# Patient Record
Sex: Male | Born: 1950 | Race: White | Hispanic: No | Marital: Married | Smoking: Former smoker
Health system: Southern US, Community
[De-identification: ages and names within clinical notes are randomized; demographics above are authoritative.]

## PROBLEM LIST (undated history)

## (undated) DIAGNOSIS — K219 Gastro-esophageal reflux disease without esophagitis: Secondary | ICD-10-CM

## (undated) DIAGNOSIS — I10 Essential (primary) hypertension: Secondary | ICD-10-CM

## (undated) DIAGNOSIS — I472 Ventricular tachycardia, unspecified: Secondary | ICD-10-CM

## (undated) DIAGNOSIS — E119 Type 2 diabetes mellitus without complications: Secondary | ICD-10-CM

## (undated) DIAGNOSIS — I428 Other cardiomyopathies: Secondary | ICD-10-CM

## (undated) DIAGNOSIS — K259 Gastric ulcer, unspecified as acute or chronic, without hemorrhage or perforation: Secondary | ICD-10-CM

## (undated) DIAGNOSIS — Z8739 Personal history of other diseases of the musculoskeletal system and connective tissue: Secondary | ICD-10-CM

## (undated) DIAGNOSIS — I509 Heart failure, unspecified: Secondary | ICD-10-CM

## (undated) DIAGNOSIS — C73 Malignant neoplasm of thyroid gland: Secondary | ICD-10-CM

## (undated) DIAGNOSIS — J45909 Unspecified asthma, uncomplicated: Secondary | ICD-10-CM

## (undated) DIAGNOSIS — E890 Postprocedural hypothyroidism: Secondary | ICD-10-CM

## (undated) DIAGNOSIS — D649 Anemia, unspecified: Secondary | ICD-10-CM

## (undated) DIAGNOSIS — Z95810 Presence of automatic (implantable) cardiac defibrillator: Secondary | ICD-10-CM

## (undated) DIAGNOSIS — M1990 Unspecified osteoarthritis, unspecified site: Secondary | ICD-10-CM

## (undated) DIAGNOSIS — I447 Left bundle-branch block, unspecified: Secondary | ICD-10-CM

## (undated) DIAGNOSIS — E669 Obesity, unspecified: Secondary | ICD-10-CM

## (undated) DIAGNOSIS — I2510 Atherosclerotic heart disease of native coronary artery without angina pectoris: Secondary | ICD-10-CM

## (undated) DIAGNOSIS — E785 Hyperlipidemia, unspecified: Secondary | ICD-10-CM

## (undated) DIAGNOSIS — S83209A Unspecified tear of unspecified meniscus, current injury, unspecified knee, initial encounter: Secondary | ICD-10-CM

## (undated) HISTORY — DX: Essential (primary) hypertension: I10

## (undated) HISTORY — DX: Postprocedural hypothyroidism: E89.0

## (undated) HISTORY — DX: Other cardiomyopathies: I42.8

## (undated) HISTORY — DX: Malignant neoplasm of thyroid gland: C73

## (undated) HISTORY — DX: Obesity, unspecified: E66.9

## (undated) HISTORY — PX: VASECTOMY: SHX75

## (undated) HISTORY — DX: Ventricular tachycardia, unspecified: I47.20

## (undated) HISTORY — DX: Left bundle-branch block, unspecified: I44.7

## (undated) HISTORY — DX: Hyperlipidemia, unspecified: E78.5

## (undated) HISTORY — PX: OTHER SURGICAL HISTORY: SHX169

## (undated) HISTORY — DX: Gastric ulcer, unspecified as acute or chronic, without hemorrhage or perforation: K25.9

## (undated) HISTORY — DX: Atherosclerotic heart disease of native coronary artery without angina pectoris: I25.10

## (undated) HISTORY — DX: Unspecified tear of unspecified meniscus, current injury, unspecified knee, initial encounter: S83.209A

## (undated) HISTORY — DX: Ventricular tachycardia: I47.2

## (undated) HISTORY — DX: Anemia, unspecified: D64.9

---

## 1999-12-18 ENCOUNTER — Encounter: Payer: Self-pay | Admitting: Internal Medicine

## 2000-01-18 ENCOUNTER — Encounter: Payer: Self-pay | Admitting: Internal Medicine

## 2004-12-07 ENCOUNTER — Ambulatory Visit: Payer: Self-pay | Admitting: Internal Medicine

## 2005-12-24 ENCOUNTER — Ambulatory Visit: Payer: Self-pay | Admitting: Internal Medicine

## 2007-01-25 ENCOUNTER — Ambulatory Visit: Payer: Self-pay | Admitting: Internal Medicine

## 2007-01-25 LAB — CONVERTED CEMR LAB
Albumin: 4 g/dL (ref 3.5–5.2)
Alkaline Phosphatase: 69 units/L (ref 39–117)
BUN: 11 mg/dL (ref 6–23)
Bilirubin, Direct: 0.1 mg/dL (ref 0.0–0.3)
Cholesterol: 237 mg/dL (ref 0–200)
Creatinine, Ser: 0.9 mg/dL (ref 0.4–1.5)
Creatinine,U: 71.9 mg/dL
Direct LDL: 170.2 mg/dL
Eosinophils Relative: 2.3 % (ref 0.0–5.0)
GFR calc non Af Amer: 93 mL/min
HCT: 42.6 % (ref 39.0–52.0)
Lymphocytes Relative: 29.8 % (ref 12.0–46.0)
MCHC: 34.3 g/dL (ref 30.0–36.0)
MCV: 88.5 fL (ref 78.0–100.0)
Microalb, Ur: 5.5 mg/dL — ABNORMAL HIGH (ref 0.0–1.9)
Monocytes Absolute: 0.5 10*3/uL (ref 0.2–0.7)
PSA: 0.99 ng/mL (ref 0.10–4.00)
Platelets: 281 10*3/uL (ref 150–400)
RBC: 4.81 M/uL (ref 4.22–5.81)
Sodium: 140 meq/L (ref 135–145)
VLDL: 39 mg/dL (ref 0–40)

## 2007-02-03 ENCOUNTER — Ambulatory Visit: Payer: Self-pay | Admitting: Internal Medicine

## 2007-04-25 ENCOUNTER — Ambulatory Visit: Payer: Self-pay | Admitting: Internal Medicine

## 2007-04-25 LAB — CONVERTED CEMR LAB
AST: 23 units/L (ref 0–37)
BUN: 13 mg/dL (ref 6–23)
Cholesterol: 191 mg/dL (ref 0–200)
Creatinine,U: 123 mg/dL
HDL: 34.1 mg/dL — ABNORMAL LOW (ref >39.0)
Hgb A1c MFr Bld: 6.5 % — ABNORMAL HIGH (ref 4.6–6.0)
Microalb Creat Ratio: 68.3 mg/g — ABNORMAL HIGH (ref 0.0–30.0)
Microalb, Ur: 8.4 mg/dL — ABNORMAL HIGH (ref 0.0–1.9)
Potassium: 4.1 meq/L (ref 3.5–5.1)
VLDL: 38 mg/dL (ref 0–40)

## 2007-05-02 ENCOUNTER — Ambulatory Visit: Payer: Self-pay | Admitting: Internal Medicine

## 2007-05-02 DIAGNOSIS — G5600 Carpal tunnel syndrome, unspecified upper limb: Secondary | ICD-10-CM

## 2007-08-14 ENCOUNTER — Ambulatory Visit: Payer: Self-pay | Admitting: Internal Medicine

## 2007-08-19 LAB — CONVERTED CEMR LAB
Cholesterol: 171 mg/dL (ref 0–200)
HDL: 29.4 mg/dL — ABNORMAL LOW (ref >39.0)
Total CHOL/HDL Ratio: 5.8
Triglycerides: 88 mg/dL (ref 0–149)

## 2007-08-21 ENCOUNTER — Ambulatory Visit: Payer: Self-pay | Admitting: Internal Medicine

## 2007-08-21 ENCOUNTER — Encounter (INDEPENDENT_AMBULATORY_CARE_PROVIDER_SITE_OTHER): Payer: Self-pay | Admitting: *Deleted

## 2007-08-21 LAB — CONVERTED CEMR LAB: HDL goal, serum: 40 mg/dL

## 2007-10-20 ENCOUNTER — Ambulatory Visit: Payer: Self-pay | Admitting: Internal Medicine

## 2007-10-21 LAB — CONVERTED CEMR LAB
Basophils Absolute: 0 10*3/uL (ref 0.0–0.1)
Basophils Relative: 0.2 % (ref 0.0–1.0)
Hemoglobin: 13.5 g/dL (ref 13.0–17.0)
Hgb A1c MFr Bld: 5.4 % (ref 4.6–6.0)
MCHC: 34.9 g/dL (ref 30.0–36.0)
MCV: 89.9 fL (ref 78.0–100.0)
Neutro Abs: 7.6 10*3/uL (ref 1.4–7.7)
Neutrophils Relative %: 73.5 % (ref 43.0–77.0)
Platelets: 300 10*3/uL (ref 150–400)
WBC: 10.3 10*3/uL (ref 4.5–10.5)

## 2007-10-23 ENCOUNTER — Encounter (INDEPENDENT_AMBULATORY_CARE_PROVIDER_SITE_OTHER): Payer: Self-pay | Admitting: *Deleted

## 2007-12-15 ENCOUNTER — Ambulatory Visit: Payer: Self-pay | Admitting: Internal Medicine

## 2007-12-18 LAB — CONVERTED CEMR LAB
ALT: 17 units/L (ref 0–53)
AST: 16 units/L (ref 0–37)
BUN: 9 mg/dL (ref 6–23)
Bilirubin, Direct: 0.1 mg/dL (ref 0.0–0.3)
CO2: 32 meq/L (ref 19–32)
Calcium: 9.4 mg/dL (ref 8.4–10.5)
Creatinine, Ser: 1.2 mg/dL (ref 0.4–1.5)
LDL Cholesterol: 97 mg/dL (ref 0–99)
Potassium: 3.8 meq/L (ref 3.5–5.1)
Sodium: 143 meq/L (ref 135–145)
TSH: 7.8 microintl units/mL — ABNORMAL HIGH (ref 0.35–5.50)
Total Bilirubin: 0.6 mg/dL (ref 0.3–1.2)
Triglycerides: 109 mg/dL (ref 0–149)

## 2007-12-21 ENCOUNTER — Ambulatory Visit: Payer: Self-pay | Admitting: Internal Medicine

## 2007-12-21 DIAGNOSIS — E039 Hypothyroidism, unspecified: Secondary | ICD-10-CM | POA: Insufficient documentation

## 2008-06-12 ENCOUNTER — Ambulatory Visit: Payer: Self-pay | Admitting: Internal Medicine

## 2008-06-24 LAB — CONVERTED CEMR LAB: Hgb A1c MFr Bld: 5.7 % (ref 4.6–6.0)

## 2008-11-01 ENCOUNTER — Ambulatory Visit: Payer: Self-pay | Admitting: Internal Medicine

## 2008-11-11 ENCOUNTER — Encounter (INDEPENDENT_AMBULATORY_CARE_PROVIDER_SITE_OTHER): Payer: Self-pay | Admitting: *Deleted

## 2008-11-11 LAB — CONVERTED CEMR LAB
Albumin: 4 g/dL (ref 3.5–5.2)
Alkaline Phosphatase: 52 units/L (ref 39–117)
BUN: 12 mg/dL (ref 6–23)
Direct LDL: 144.9 mg/dL
HDL: 30 mg/dL — ABNORMAL LOW (ref >39.0)
Microalb Creat Ratio: 43.5 mg/g — ABNORMAL HIGH (ref 0.0–30.0)
Microalb, Ur: 3.6 mg/dL — ABNORMAL HIGH (ref 0.0–1.9)
TSH: 8.67 microintl units/mL — ABNORMAL HIGH (ref 0.35–5.50)
Total Bilirubin: 0.9 mg/dL (ref 0.3–1.2)
Total CHOL/HDL Ratio: 7.4
Total Protein: 7.5 g/dL (ref 6.0–8.3)
Triglycerides: 246 mg/dL (ref 0–149)

## 2008-11-12 ENCOUNTER — Ambulatory Visit: Payer: Self-pay | Admitting: Internal Medicine

## 2009-02-10 ENCOUNTER — Ambulatory Visit: Payer: Self-pay | Admitting: Internal Medicine

## 2009-02-16 LAB — CONVERTED CEMR LAB
ALT: 24 units/L (ref 0–53)
BUN: 13 mg/dL (ref 6–23)
Cholesterol: 137 mg/dL (ref 0–200)
Creatinine, Ser: 0.9 mg/dL (ref 0.4–1.5)
Hgb A1c MFr Bld: 7.2 % — ABNORMAL HIGH (ref 4.6–6.0)
Potassium: 4.1 meq/L (ref 3.5–5.1)
Triglycerides: 146 mg/dL (ref 0–149)
VLDL: 29 mg/dL (ref 0–40)

## 2009-02-18 ENCOUNTER — Ambulatory Visit: Payer: Self-pay | Admitting: Internal Medicine

## 2009-02-26 ENCOUNTER — Telehealth (INDEPENDENT_AMBULATORY_CARE_PROVIDER_SITE_OTHER): Payer: Self-pay | Admitting: *Deleted

## 2009-03-06 ENCOUNTER — Telehealth (INDEPENDENT_AMBULATORY_CARE_PROVIDER_SITE_OTHER): Payer: Self-pay | Admitting: *Deleted

## 2009-03-07 ENCOUNTER — Encounter: Payer: Self-pay | Admitting: Internal Medicine

## 2009-05-21 ENCOUNTER — Ambulatory Visit: Payer: Self-pay | Admitting: Internal Medicine

## 2009-05-21 LAB — CONVERTED CEMR LAB
BUN: 16 mg/dL (ref 6–23)
Creatinine, Ser: 1 mg/dL (ref 0.4–1.5)
Creatinine,U: 166.5 mg/dL
Microalb, Ur: 9.1 mg/dL — ABNORMAL HIGH (ref 0.0–1.9)
Potassium: 3.9 meq/L (ref 3.5–5.1)

## 2009-05-29 ENCOUNTER — Ambulatory Visit: Payer: Self-pay | Admitting: Internal Medicine

## 2009-07-30 ENCOUNTER — Ambulatory Visit: Payer: Self-pay | Admitting: Internal Medicine

## 2009-08-05 LAB — CONVERTED CEMR LAB
Creatinine,U: 161.6 mg/dL
Hgb A1c MFr Bld: 5.7 % (ref 4.6–6.5)
Microalb, Ur: 4.7 mg/dL — ABNORMAL HIGH (ref 0.0–1.9)

## 2009-08-06 ENCOUNTER — Ambulatory Visit: Payer: Self-pay | Admitting: Internal Medicine

## 2009-08-06 DIAGNOSIS — E119 Type 2 diabetes mellitus without complications: Secondary | ICD-10-CM

## 2009-08-06 DIAGNOSIS — E1165 Type 2 diabetes mellitus with hyperglycemia: Secondary | ICD-10-CM | POA: Insufficient documentation

## 2009-08-12 ENCOUNTER — Encounter (INDEPENDENT_AMBULATORY_CARE_PROVIDER_SITE_OTHER): Payer: Self-pay | Admitting: *Deleted

## 2009-11-29 DIAGNOSIS — K259 Gastric ulcer, unspecified as acute or chronic, without hemorrhage or perforation: Secondary | ICD-10-CM

## 2009-11-29 HISTORY — DX: Gastric ulcer, unspecified as acute or chronic, without hemorrhage or perforation: K25.9

## 2010-01-02 ENCOUNTER — Encounter (INDEPENDENT_AMBULATORY_CARE_PROVIDER_SITE_OTHER): Payer: Self-pay | Admitting: *Deleted

## 2010-02-04 ENCOUNTER — Ambulatory Visit: Payer: Self-pay | Admitting: Internal Medicine

## 2010-02-11 ENCOUNTER — Ambulatory Visit: Payer: Self-pay | Admitting: Internal Medicine

## 2010-02-11 DIAGNOSIS — R490 Dysphonia: Secondary | ICD-10-CM

## 2010-03-18 ENCOUNTER — Ambulatory Visit: Payer: Self-pay | Admitting: Internal Medicine

## 2010-03-18 DIAGNOSIS — R509 Fever, unspecified: Secondary | ICD-10-CM

## 2010-03-27 ENCOUNTER — Telehealth (INDEPENDENT_AMBULATORY_CARE_PROVIDER_SITE_OTHER): Payer: Self-pay | Admitting: *Deleted

## 2010-03-29 HISTORY — PX: CARDIAC CATHETERIZATION: SHX172

## 2010-03-29 HISTORY — PX: BI-VENTRICULAR IMPLANTABLE CARDIOVERTER DEFIBRILLATOR  (CRT-D): SHX5747

## 2010-03-30 ENCOUNTER — Encounter: Payer: Self-pay | Admitting: Internal Medicine

## 2010-03-30 ENCOUNTER — Telehealth (INDEPENDENT_AMBULATORY_CARE_PROVIDER_SITE_OTHER): Payer: Self-pay | Admitting: *Deleted

## 2010-04-08 ENCOUNTER — Ambulatory Visit: Payer: Self-pay | Admitting: Internal Medicine

## 2010-04-08 ENCOUNTER — Telehealth (INDEPENDENT_AMBULATORY_CARE_PROVIDER_SITE_OTHER): Payer: Self-pay | Admitting: *Deleted

## 2010-04-09 ENCOUNTER — Telehealth: Payer: Self-pay | Admitting: Internal Medicine

## 2010-04-12 ENCOUNTER — Inpatient Hospital Stay (HOSPITAL_COMMUNITY): Admission: EM | Admit: 2010-04-12 | Discharge: 2010-04-16 | Payer: Self-pay | Admitting: Emergency Medicine

## 2010-04-12 ENCOUNTER — Ambulatory Visit: Payer: Self-pay | Admitting: Internal Medicine

## 2010-04-13 ENCOUNTER — Encounter: Payer: Self-pay | Admitting: Internal Medicine

## 2010-04-14 ENCOUNTER — Encounter: Payer: Self-pay | Admitting: Internal Medicine

## 2010-04-14 ENCOUNTER — Encounter (INDEPENDENT_AMBULATORY_CARE_PROVIDER_SITE_OTHER): Payer: Self-pay | Admitting: Internal Medicine

## 2010-04-17 ENCOUNTER — Telehealth (INDEPENDENT_AMBULATORY_CARE_PROVIDER_SITE_OTHER): Payer: Self-pay | Admitting: *Deleted

## 2010-04-23 ENCOUNTER — Ambulatory Visit: Payer: Self-pay | Admitting: Internal Medicine

## 2010-04-23 DIAGNOSIS — J45909 Unspecified asthma, uncomplicated: Secondary | ICD-10-CM | POA: Insufficient documentation

## 2010-04-29 ENCOUNTER — Ambulatory Visit: Payer: Self-pay

## 2010-04-29 ENCOUNTER — Encounter: Payer: Self-pay | Admitting: Internal Medicine

## 2010-05-01 ENCOUNTER — Ambulatory Visit: Payer: Self-pay | Admitting: Pulmonary Disease

## 2010-05-01 DIAGNOSIS — J811 Chronic pulmonary edema: Secondary | ICD-10-CM

## 2010-05-11 ENCOUNTER — Telehealth (INDEPENDENT_AMBULATORY_CARE_PROVIDER_SITE_OTHER): Payer: Self-pay | Admitting: *Deleted

## 2010-05-26 ENCOUNTER — Ambulatory Visit: Payer: Self-pay | Admitting: Internal Medicine

## 2010-05-26 DIAGNOSIS — M25519 Pain in unspecified shoulder: Secondary | ICD-10-CM | POA: Insufficient documentation

## 2010-05-26 DIAGNOSIS — R5381 Other malaise: Secondary | ICD-10-CM | POA: Insufficient documentation

## 2010-05-26 DIAGNOSIS — R5383 Other fatigue: Secondary | ICD-10-CM

## 2010-05-26 LAB — CONVERTED CEMR LAB
Folate: 16 ng/mL
Lymphocytes Relative: 38.5 % (ref 12.0–46.0)
MCV: 86.6 fL (ref 78.0–100.0)
Monocytes Relative: 19 % — ABNORMAL HIGH (ref 3.0–12.0)
Platelets: 401 10*3/uL — ABNORMAL HIGH (ref 150.0–400.0)
RBC: 3.52 M/uL — ABNORMAL LOW (ref 4.22–5.81)
RDW: 15.1 % — ABNORMAL HIGH (ref 11.5–14.6)
Saturation Ratios: 5.7 % — ABNORMAL LOW (ref 20.0–50.0)

## 2010-05-27 ENCOUNTER — Telehealth (INDEPENDENT_AMBULATORY_CARE_PROVIDER_SITE_OTHER): Payer: Self-pay | Admitting: *Deleted

## 2010-05-27 ENCOUNTER — Telehealth: Payer: Self-pay | Admitting: Internal Medicine

## 2010-05-27 DIAGNOSIS — D509 Iron deficiency anemia, unspecified: Secondary | ICD-10-CM

## 2010-05-28 ENCOUNTER — Encounter: Payer: Self-pay | Admitting: Internal Medicine

## 2010-06-03 ENCOUNTER — Ambulatory Visit: Payer: Self-pay | Admitting: Pulmonary Disease

## 2010-06-15 ENCOUNTER — Telehealth: Payer: Self-pay | Admitting: Internal Medicine

## 2010-06-15 ENCOUNTER — Encounter: Payer: Self-pay | Admitting: Internal Medicine

## 2010-07-14 ENCOUNTER — Ambulatory Visit: Payer: Self-pay | Admitting: Internal Medicine

## 2010-07-14 DIAGNOSIS — K219 Gastro-esophageal reflux disease without esophagitis: Secondary | ICD-10-CM

## 2010-07-21 ENCOUNTER — Ambulatory Visit: Payer: Self-pay | Admitting: Internal Medicine

## 2010-07-21 DIAGNOSIS — Z95810 Presence of automatic (implantable) cardiac defibrillator: Secondary | ICD-10-CM

## 2010-08-10 ENCOUNTER — Ambulatory Visit: Payer: Self-pay | Admitting: Internal Medicine

## 2010-08-12 ENCOUNTER — Ambulatory Visit: Payer: Self-pay | Admitting: Internal Medicine

## 2010-08-14 LAB — CONVERTED CEMR LAB
ALT: 24 units/L (ref 0–53)
AST: 23 units/L (ref 0–37)
Albumin: 4.2 g/dL (ref 3.5–5.2)
Alkaline Phosphatase: 45 units/L (ref 39–117)
BUN: 16 mg/dL (ref 6–23)
Bilirubin, Direct: 0.1 mg/dL (ref 0.0–0.3)
Creatinine,U: 65.2 mg/dL
HDL: 31.9 mg/dL — ABNORMAL LOW (ref >39.00)
LDL Cholesterol: 103 mg/dL — ABNORMAL HIGH (ref 0–99)
Microalb, Ur: 1.3 mg/dL (ref 0.0–1.9)
Total Bilirubin: 0.8 mg/dL (ref 0.3–1.2)
Total CHOL/HDL Ratio: 5
Triglycerides: 127 mg/dL (ref 0.0–149.0)

## 2010-08-16 ENCOUNTER — Encounter: Payer: Self-pay | Admitting: Internal Medicine

## 2010-08-17 ENCOUNTER — Ambulatory Visit: Payer: Self-pay | Admitting: Internal Medicine

## 2010-10-26 ENCOUNTER — Encounter: Payer: Self-pay | Admitting: Internal Medicine

## 2010-10-30 ENCOUNTER — Ambulatory Visit: Payer: Self-pay | Admitting: Internal Medicine

## 2010-11-16 ENCOUNTER — Ambulatory Visit: Payer: Self-pay | Admitting: Internal Medicine

## 2010-11-17 LAB — CONVERTED CEMR LAB: Hgb A1c MFr Bld: 6.2 % (ref 4.6–6.5)

## 2010-11-18 ENCOUNTER — Ambulatory Visit: Payer: Self-pay | Admitting: Internal Medicine

## 2010-12-01 ENCOUNTER — Telehealth (INDEPENDENT_AMBULATORY_CARE_PROVIDER_SITE_OTHER): Payer: Self-pay | Admitting: *Deleted

## 2010-12-08 ENCOUNTER — Encounter (INDEPENDENT_AMBULATORY_CARE_PROVIDER_SITE_OTHER): Payer: Self-pay | Admitting: *Deleted

## 2010-12-27 LAB — CONVERTED CEMR LAB: TSH: 3.49 microintl units/mL (ref 0.35–5.50)

## 2010-12-28 ENCOUNTER — Ambulatory Visit
Admission: RE | Admit: 2010-12-28 | Discharge: 2010-12-28 | Payer: Self-pay | Source: Home / Self Care | Attending: Internal Medicine | Admitting: Internal Medicine

## 2010-12-29 NOTE — Letter (Signed)
Summary: Colonoscopy Letter  [facility]  [address] [place], [place] [address]   Phone: [phone]  Fax: [phone]      January 02, 2010 MRN: [id number]   [facility] [address] [place], [place]  [address]   Dear Mr. Walko,   According to your medical record, it is time for you to schedule a Colonoscopy. The American Cancer Society recommends this procedure as a method to detect early colon cancer. Patients with a family history of colon cancer, or a personal history of colon polyps or inflammatory bowel disease are at increased risk.  This letter has beeen generated based on the recommendations made at the time of your procedure. If you feel that in your particular situation this may no longer apply, please contact our office.  Please call our office at [phone] to schedule this appointment or to update your records at your earliest convenience.  Thank you for cooperating with [place] to provide you with the very best care possible.   Sincerely,  [name]. Marina Goodell, M.D.  [facility] [phone]

## 2010-12-29 NOTE — Procedures (Signed)
Summary: wound check/jml   Current Medications (verified): 1)  Viagra 100 Mg Tabs (Sildenafil Citrate) .... Prn 2)  Metformin Hcl 1000 Mg Tabs (Metformin Hcl) .Marland Kitchen.. 1 By Mouth Two Times A Day 3)  Glimepiride 2 Mg Tabs (Glimepiride) .Marland Kitchen.. 1 By Mouth Two Times A Day 4)  Cozaar 25 Mg Tabs (Losartan Potassium) .Marland Kitchen.. 1 By Mouth Once Daily 5)  Simvastatin 40 Mg  Tabs (Simvastatin) .Marland Kitchen.. 1 Qhs 6)  Levothroid 50 Mcg  Tabs (Levothyroxine Sodium) .Marland Kitchen.. 1 Once Daily 7)  Vit E .... Prn 8)  Fish Oil .... Prn 9)  Aspirin 325 Mg Tabs (Aspirin) .Marland Kitchen.. 1 By Mouth Once Daily 10)  Multivitamin 11)  Niacin Cr 500 Mg Cr-Caps (Niacin) .Marland Kitchen.. 1 By Mouth Once Daily 12)  Januvia 100 Mg Tabs (Sitagliptin Phosphate) .Marland Kitchen.. 1 Once Daily 13)  Ventolin Hfa 108 (90 Base) Mcg/act Aers (Albuterol Sulfate) .Marland Kitchen.. 1-2 Puffs Every 4 Hrs As Needed Cough ,sob 14)  Symbicort 160-4.5 Mcg/act Aero (Budesonide-Formoterol Fumarate) .Marland Kitchen.. 1-2 Puffs Every 12 Hrs As Needed 15)  Promethazine Vc/codeine 6.25-5-10 Mg/69ml Syrp (Phenyleph-Promethazine-Cod) .Marland Kitchen.. 1 Tsp Every 6 Hrs As Needed For Cough 16)  Wavesense Ultra-Thin Lancets  Misc (Lancets) .... 250.00 Check Bloodsugar Once Daily 17)  Wavesense Presto W/device Kit (Blood Glucose Monitoring Suppl) .... 250.00 Check Bs One Time Daily 18)  Wavesense Presto Test  Strp (Glucose Blood) .... 250.00, Check Bs Once Daily 19)  Amiodarone Hcl 200 Mg Tabs (Amiodarone Hcl) .Marland Kitchen.. 1 By Mouth Every 12 Hours 20)  Carvedilol 6.25 Mg Tabs (Carvedilol) .Marland Kitchen.. 1 By Mouth Two Times A Day With Meals 21)  Furosemide 40 Mg Tabs (Furosemide) .Marland Kitchen.. 1 By Mouth Once Daily 22)  Mytussin Ac 100-10 Mg/71ml Syrp (Guaifenesin-Codeine) .Marland Kitchen.. 15 Milliters Every 6 Hours As Needed 23)  Lovaza 1 Gm Caps (Omega-3-Acid Ethyl Esters) .Marland Kitchen.. 1 By Mouth Three Times A Day 24)  Potassium Chloride Otc .Marland Kitchen.. 1 By Mouth Once Daily  Allergies (verified): 1)  * Altace   ICD Specifications Following MD:  Lewayne Bunting, MD     ICD Vendor:  [employer]      ICD Model Number:  [phone]     ICD Serial Number:  [id number] ICD DOI:  04/13/2010     ICD Implanting MD:  Lewayne Bunting, MD  Lead 1:    Location: RA     DOI: 04/13/2010     Model #: [id number]     Serial #: [id number]     Status: active Lead 2:    Location: RV     DOI: 04/13/2010     Model #: [id number]     Serial #: [id number]     Status: active Lead 3:    Location: LV     DOI: 04/13/2010     Model #: [id number]     Serial #: [id number]     Status: active  Indications::  CM   ICD Follow Up Remote Check?  No Battery Voltage:  3.21 V     Charge Time:  8.6 seconds     Underlying rhythm:  SR ICD Dependent:  No       ICD Device Measurements Atrium:  Amplitude: 1.9 mV, Impedance: 475 ohms, Threshold: 1.25 V at 0.4 msec Right Ventricle:  Amplitude: 11.9 mV, Impedance: 475 ohms, Threshold: 1.0 V at 0.4 msec Left Ventricle:  Impedance: 779 ohms, Threshold: 1.0 V at 0.4 msec Configuration: LV TIP TO LV RING Shock Impedance: 45/54 ohms   Episodes MS Episodes:  0  Shock:  0     ATP:  0     Nonsustained:  0     Atrial Pacing:  <0.1%     Ventricular Pacing:  94.6%  Brady Parameters Mode DDD     Lower Rate Limit:  50     Upper Rate Limit 130 PAV 140     Sensed AV Delay:  110  Tachy Zones VF:  200     VT:  240 FVT VIA VF     VT1:  171     Next Cardiology Appt Due:  06/29/2010 Tech Comments:  Wound check appt.  Steri-strips removed.  Wound without redness or edema.  Normal device function.  No changes made today.  ROV 3 months GT. Gypsy Balsam RN BSN  May 11, 2010 3:09 PM

## 2010-12-29 NOTE — Procedures (Signed)
Summary: EGD/[facility]  EGD/[facility]   Imported By: Sherian Rein 07/15/2010 11:12:52  _____________________________________________________________________  External Attachment:    Type:   Image     Comment:   External Document

## 2010-12-29 NOTE — Progress Notes (Signed)
Summary: prior auth APPROVED VIAGRA [employer]  Phone Note Refill Request Message from:  Fax from Pharmacy on March 27, 2010 8:27 AM  Refills Requested: Medication #1:  VIAGRA 100 MG TABS prn [facility] [address] ----fax [phone] - ph [phone] ---ph #  prior Berkley Harvey [phone] for viagra  Initial call taken by: Okey Regal Spring,  March 27, 2010 8:30 AM  Follow-up for Phone Call        AWAITING FAX...............Marland KitchenFelecia Deloach CMA  March 27, 2010 9:15 AM  prior auth faxed back awaiting response.............Marland KitchenFelecia Deloach CMA  March 27, 2010 11:27 AM   Additional Follow-up for Phone Call Additional follow up Details #1::        prior auth APPROVED 03-30-10 UNTIL 04-29-11, PHARMACY FAXED APPROVAL LETTER SCAN TO CHART.................Marland KitchenFelecia Deloach CMA  Mar 30, 2010 3:24 PM

## 2010-12-29 NOTE — Assessment & Plan Note (Signed)
Summary: cough/kdc   Vital Signs:  Patient profile:   60 year old male Weight:      282.4 pounds Temp:     100.4 degrees F oral Pulse rate:   88 / minute Resp:     17 per minute BP sitting:   128 / 78  (left arm) Cuff size:   large  Vitals Entered By: Shonna Chock (March 18, 2010 3:29 PM) CC: Cough x 2 weeks, worse when laying down and fever Comments REVIEWED MED LIST, PATIENT AGREED DOSE AND INSTRUCTION CORRECT    CC:  Cough x 2 weeks and worse when laying down and fever.  History of Present Illness:  Cough      This is a 60 year old man who presents with Cough X 2 weeks.  The patient reports  mainly ("80%")productive cough,   SS chest pain after bouts of cough, shortness of breath, wheezing, exertional dyspnea, fever , and malaise, but denies hemoptysis.  Associated symtpoms include weight loss of 5-10#.  The patient denies the following symptoms: cold/URI symptoms, sore throat, nasal congestion, chronic rhinitis, acid reflux symptoms, and peripheral edema.  The cough is worse with lying down or in LLDP.  Partially effective prior treatments have included OTC cough medication ( Dayquil)  and other asthma medication( QVAR).  Risk factors include PMH  of asthma and history of reflux.    Allergies: 1)  * Altace  Review of Systems General:  Complains of sweats. ENT:  Complains of hoarseness; denies difficulty swallowing, nasal congestion, sinus pressure, and sore throat; No frontal headache , facial pain or purulence. Resp:  Denies chest pain with inspiration and coughing up blood.  Physical Exam  General:  Appears tired but well-nourished,in no acute distress; alert,appropriate and cooperative throughout examination Ears:  External ear exam shows no significant lesions or deformities.  Otoscopic examination reveals clear canals, tympanic membranes are intact bilaterally without bulging, retraction, inflammation or discharge. Hearing is grossly normal bilaterally. Nose:  External  nasal examination shows no deformity or inflammation. Nasal mucosa are dry, especially R nare , without lesions or exudates. Mouth:  Oral mucosa and oropharynx without lesions or exudates.  Teeth in good repair. Hoarse Lungs:  Normal respiratory effort, chest expands symmetrically. Lungs are clear to auscultation, no crackles or wheezes but BS decreased. O2 sats 92% Heart:  regular rhythm, no murmur, tachycardia & S4.  P 109 Cervical Nodes:  No lymphadenopathy noted Axillary Nodes:  No palpable lymphadenopathy   Impression & Recommendations:  Problem # 1:  ASTHMA NOS W/ACUTE EXACERBATION (ICD-493.92)  His updated medication list for this problem includes:    Qvar 80 Mcg/act Aers (Beclomethasone dipropionate) .Marland Kitchen... 2 puffs eveery 12 hrs ; gargle & spit after use    Ventolin Hfa 108 (90 Base) Mcg/act Aers (Albuterol sulfate) .Marland Kitchen... 1-2 puffs every 4 hrs as needed cough ,sob    Symbicort 160-4.5 Mcg/act Aero (Budesonide-formoterol fumarate) .Marland Kitchen... 1-2 puffs every 12 hrs as needed  Orders: T-2 View CXR (71020TC)  Problem # 2:  BRONCHITIS-ACUTE (ICD-466.0)  His updated medication list for this problem includes:    Qvar 80 Mcg/act Aers (Beclomethasone dipropionate) .Marland Kitchen... 2 puffs eveery 12 hrs ; gargle & spit after use    Ventolin Hfa 108 (90 Base) Mcg/act Aers (Albuterol sulfate) .Marland Kitchen... 1-2 puffs every 4 hrs as needed cough ,sob    Avelox Abc Pack 400 Mg Tabs (Moxifloxacin hcl) .Marland Kitchen... 1 once daily    Symbicort 160-4.5 Mcg/act Aero (Budesonide-formoterol fumarate) .Marland Kitchen... 1-2 puffs  every 12 hrs as needed    Promethazine Vc/codeine 6.25-5-10 Mg/35ml Syrp (Phenyleph-promethazine-cod) .Marland Kitchen... 1 tsp every 6 hrs as needed for cough  Orders: T-2 View CXR (71020TC)  Problem # 3:  FEVER UNSPECIFIED (ICD-780.60)  Orders: Flu A+B (87400) T-2 View CXR (71020TC)  Complete Medication List: 1)  Viagra 100 Mg Tabs (Sildenafil citrate) .... Prn 2)  Metformin Hcl 1000 Mg  .Marland Kitchen.. 1 two times a day with meals 3)   Amaryl 4 Mg Tabs (glimepiride)  .... 1/4 once daily 4)  Cozaar 100 Mg Tabs (Losartan potassium) .Marland Kitchen.. 1 once daily out of med 5)  Simvastatin 40 Mg Tabs (Simvastatin) .Marland Kitchen.. 1 qhs 6)  Levothroid 50 Mcg Tabs (levothyroxine Sodium)  .Marland Kitchen.. 1 once daily 7)  Vit E  .... Prn 8)  Fish Oil  .... Prn 9)  Basa  .... Qd 10)  Multivitamin  11)  Potassium Otc  .... Qd 12)  Niacin Cr 500 Mg Cr-caps (Niacin) .Marland Kitchen.. 1 by mouth once daily 13)  Januvia 100 Mg Tabs (Sitagliptin phosphate) .Marland Kitchen.. 1 once daily 14)  Toprol Xl 25 Mg Xr24h-tab (Metoprolol succinate) .Marland Kitchen.. 1 once daily 15)  Qvar 80 Mcg/act Aers (Beclomethasone dipropionate) .... 2 puffs eveery 12 hrs ; gargle & spit after use 16)  Ventolin Hfa 108 (90 Base) Mcg/act Aers (Albuterol sulfate) .Marland Kitchen.. 1-2 puffs every 4 hrs as needed cough ,sob 17)  Avelox Abc Pack 400 Mg Tabs (Moxifloxacin hcl) .Marland Kitchen.. 1 once daily 18)  Symbicort 160-4.5 Mcg/act Aero (Budesonide-formoterol fumarate) .Marland Kitchen.. 1-2 puffs every 12 hrs as needed 19)  Promethazine Vc/codeine 6.25-5-10 Mg/46ml Syrp (Phenyleph-promethazine-cod) .Marland Kitchen.. 1 tsp every 6 hrs as needed for cough  Patient Instructions: 1)  Drink as much fluid as you can tolerate for the next few days. Gargle & spit after using Symbicort. 2)  Take 650-1000mg  of Tylenol every 4-6 hours as needed for relief of pain or comfort of fever AVOID taking more than 4000mg   in a 24 hour period (can cause liver damage in higher doses). Prescriptions: PROMETHAZINE VC/CODEINE 6.25-5-10 MG/5ML SYRP (PHENYLEPH-PROMETHAZINE-COD) 1 tsp every 6 hrs as needed for cough  #120cc x 0   Entered and Authorized by:   Marga Melnick MD   Signed by:   Marga Melnick MD on 03/18/2010   Method used:   Print then Give to Patient   RxID:   [id number] SYMBICORT 160-4.5 MCG/ACT AERO (BUDESONIDE-FORMOTEROL FUMARATE) 1-2 puffs every 12 hrs as needed  #1 x 5   Entered and Authorized by:   Marga Melnick MD   Signed by:   Marga Melnick MD on 03/18/2010   Method  used:   Print then Give to Patient   RxID:   [id number] AVELOX ABC PACK 400 MG TABS (MOXIFLOXACIN HCL) 1 once daily  #5 x 0   Entered and Authorized by:   Marga Melnick MD   Signed by:   Marga Melnick MD on 03/18/2010   Method used:   Samples Given   RxID:   [id number] VENTOLIN HFA 108 (90 BASE) MCG/ACT AERS (ALBUTEROL SULFATE) 1-2 puffs every 4 hrs as needed cough ,SOB  #1 x 0   Entered and Authorized by:   Marga Melnick MD   Signed by:   Marga Melnick MD on 03/18/2010   Method used:   Print then Mail to Patient   RxID:   [id number]   Laboratory Results    Other Tests  Influenza A: negative Influenza B: negative

## 2010-12-29 NOTE — Procedures (Signed)
Summary: Colonoscopy  Patient: Mike Jimenez Note: All result statuses are Final unless otherwise noted.  Tests: (1) Colonoscopy (COL)   COL Colonoscopy           DONE     [facility]     [address]     [place], [place]  [address]           COLONOSCOPY PROCEDURE REPORT           PATIENT:  Mike Jimenez, Mike Jimenez  MR#:  [id number]     BIRTHDATE:  23-Dec-1950, 59 yrs. old  GENDER:  male     ENDOSCOPIST:  Mayjor Ager. Eda Keys, MD     REF. BY:  Screening Recall     PROCEDURE DATE:  08/12/2010     PROCEDURE:  Colonoscopy with snare polypectomy x 4     ASA CLASS:  Class III     INDICATIONS:  colorectal cancer screening, average risk, Iron     Deficiency Anemia     MEDICATIONS:   Fentanyl 100 mcg IV, Versed 9 mg IV           DESCRIPTION OF PROCEDURE:   After the risks benefits and     alternatives of the procedure were thoroughly explained, informed     consent was obtained.  Digital rectal exam was performed and     revealed no abnormalities.   The LB [id number] endoscope     was introduced through the anus and advanced to the cecum, which     was identified by both the appendix and ileocecal valve, without     limitations.Time to cecum = 20:39 min. The quality of the prep was     excellent, using MoviPrep.  The instrument was then slowly     withdrawn (13:00 min) as the colon was fully examined.     <<PROCEDUREIMAGES>>           FINDINGS:  A 2mm ascending colon polyp and 3 transverse colon     polyps < 4mm were all removed w/ cold snare. 3/4 retrieved and     submitted. Mild diverticulosis was found in the sigmoid colon.     Retroflexed views in the rectum revealed internal hemorrhoids.     The scope was then withdrawn from the patient and the procedure     completed.           COMPLICATIONS:  None     ENDOSCOPIC IMPRESSION:     1) Four small colon polyps removed     2) Mild diverticulosis in the sigmoid colon     3) Internal hemorrhoids           RECOMMENDATIONS:     1)  Repeat colonoscopy in 5 years if polyp adenomatous; otherwise     10 years     2) EGD today     ______________________________     [name]. Eda Keys, MD           CC:  Pecola Lawless, MD;The Patient           n.     Rosalie DoctorWilhemina Bonito. Eda Keys at 08/12/2010 04:39 PM           [name], [phone]  Note: An exclamation mark (!) indicates a result that was not dispersed into the flowsheet. Document Creation Date: 08/12/2010 4:40 PM _______________________________________________________________________  (1) Order result status: Final Collection or observation date-time: 08/12/2010 16:30 Requested date-time:  Receipt date-time:  Reported date-time:  Referring Physician:   Ordering Physician: Fransico Setters [id number]) Specimen Source:  Source: Launa Grill Order Number: [id number] Lab site:   Appended Document: Colonoscopy recall     Procedures Next Due Date:    Colonoscopy: 07/2015

## 2010-12-29 NOTE — Progress Notes (Signed)
Summary: Refill Request  Phone Note Refill Request   Refills Requested: Medication #1:  SIMVASTATIN 40 MG  TABS 1 qhs [facility] [address] [phone]   Method Requested: Fax to Local Pharmacy Next Appointment Scheduled: no appts scheduled Initial call taken by: Okey Regal Spring,  Mar 30, 2010 2:38 PM  Follow-up for Phone Call        We recieved a refill request for this same med from [facility] on 03/21/10, I called [facility] to verify patient requested and they didnt just automatically send it to [place] and they said patient requested.   I called [facility] and informed them to have patient contact us if he would like this filled at there pharmacy Follow-up by: Shonna Chock,  Mar 30, 2010 3:49 PM

## 2010-12-29 NOTE — Assessment & Plan Note (Signed)
Summary: 6 MONTH FOLLOW UP AND LABS//PH   Vital Signs:  Patient profile:   60 year old male Weight:      287.0 pounds BMI:     36.00 Pulse rate:   64 / minute Resp:     16 per minute BP sitting:   124 / 72  (left arm) Cuff size:   large  Vitals Entered By: Shonna Chock (February 11, 2010 8:07 AM) CC: 6 Month follow-up (discuss labs-copy given) Comments REVIEWED MED LIST, PATIENT AGREED DOSE AND INSTRUCTION CORRECT    CC:  6 Month follow-up (discuss labs-copy given).  History of Present Illness: Labs reviewed & risks discussed. FBS 100-110;no hypoglycemia.Rare glucose monitoring. Walking @ school; on low glycemic diet. Weight stable. A concern is hoarseness for 5-6 years, ? stable but worse in am & in  Spring & Fall.  Allergies: 1)  * Altace  Review of Systems General:  Denies chills, fever, and weight loss. Eyes:  Denies blurring and vision loss-both eyes; No retinopathy @ Ophth  exam 2010. ENT:  Complains of hoarseness; denies difficulty swallowing; Hoarseness ? from [employer] employment for 15 years. Hesmoked for 4-5 years. CV:  Denies chest pain or discomfort, lightheadness, and near fainting. Resp:  Denies cough, excessive snoring, hypersomnolence, morning headaches, shortness of breath, and sputum productive; Rare wheezing. GI:  Denies indigestion. Derm:  Denies poor wound healing. Neuro:  Denies numbness and tingling. Endo:  Denies excessive hunger, excessive thirst, and excessive urination. Allergy:  Denies itching eyes and sneezing.  Physical Exam  General:  well-nourished,in no acute distress; alert,appropriate and cooperative throughout examination Mouth:  Oral mucosa and oropharynx without lesions or exudates.  Teeth in good repair. Hoarse Neck:  No deformities, masses, or tenderness noted. Lungs:  Normal respiratory effort, chest expands symmetrically. Lungs are clear to auscultation, no crackles or wheezes. Heart:  normal rate, regular rhythm, no gallop, no  rub, no JVD, no HJR, and grade 1/2 /6 systolic murmur.   Abdomen:  Bowel sounds positive,abdomen soft and non-tender without masses, organomegaly or hernias noted. Pulses:  R and L carotid,radial,dorsalis pedis and posterior tibial pulses are full and equal bilaterally Extremities:  No clubbing, cyanosis, edema. Neurologic:  alert & oriented X3, sensation intact to light touch over feet, and DTRs symmetrical and normal.   Skin:  Intact without suspicious lesions or rashes Cervical Nodes:  No lymphadenopathy noted Axillary Nodes:  No palpable lymphadenopathy Psych:  memory intact for recent and remote, normally interactive, and good eye contact.     Impression & Recommendations:  Problem # 1:  DIABETES MELLITUS, CONTROLLED (ICD-250.00)  His updated medication list for this problem includes:    Cozaar 100 Mg Tabs (Losartan potassium) .Marland Kitchen... 1 once daily out of med    Januvia 100 Mg Tabs (Sitagliptin phosphate) .Marland Kitchen... 1 once daily  Problem # 2:  HOARSENESS, CHRONIC (ICD-784.42) R/O ERD component  Problem # 3:  HYPERTENSION (ICD-401.9) controlled His updated medication list for this problem includes:    Cozaar 100 Mg Tabs (Losartan potassium) .Marland Kitchen... 1 once daily out of med    Toprol Xl 25 Mg Xr24h-tab (Metoprolol succinate) .Marland Kitchen... 1 once daily  Complete Medication List: 1)  Viagra 100 Mg Tabs (Sildenafil citrate) .... Prn 2)  Metformin Hcl 1000 Mg  .Marland Kitchen.. 1 two times a day with meals 3)  Amaryl 4 Mg Tabs (glimepiride)  .... 1/4 once daily 4)  Cozaar 100 Mg Tabs (Losartan potassium) .Marland Kitchen.. 1 once daily out of med 5)  Simvastatin  40 Mg Tabs (Simvastatin) .Marland Kitchen.. 1 qhs 6)  Levothroid 50 Mcg Tabs (levothyroxine Sodium)  .Marland Kitchen.. 1 once daily 7)  Vit E  .... Prn 8)  Fish Oil  .... Prn 9)  Basa  .... Qd 10)  Multivitamin  11)  Potassium Otc  .... Qd 12)  Niacin Cr 500 Mg Cr-caps (Niacin) .Marland Kitchen.. 1 by mouth once daily 13)  Januvia 100 Mg Tabs (Sitagliptin phosphate) .Marland Kitchen.. 1 once daily 14)  Toprol Xl 25  Mg Xr24h-tab (Metoprolol succinate) .Marland Kitchen.. 1 once daily 15)  Qvar 80 Mcg/act Aers (Beclomethasone dipropionate) .... 2 puffs eveery 12 hrs ; gargle & spit after use  Patient Instructions: 1)  Please schedule a follow-up appointment in 6 months. 2)  Avoid foods high in acid (tomatoes, citrus juices, spicy foods). Avoid eating within two hours of lying down or before exercising. Do not over eat; try smaller more frequent meals. Elevate head of bed twelve inches when sleeping.Less than 40 grams of HFCS "sugar" /day. ENT consultation  if no better to visualize vocal cords. 3)  BUN,creat,K+ prior to visit, ICD-9: 4)  Hepatic Panel prior to visit, ICD-9: 5)  Lipid Panel prior to visit, ICD-9: 6)  TSH prior to visit, ICD-9: 7)  HbgA1C prior to visit, ICD-9: 8)  Urine Microalbumin prior to visit, ICD-9: Prescriptions: QVAR 80 MCG/ACT AERS (BECLOMETHASONE DIPROPIONATE) 2 puffs eveery 12 hrs ; gargle & spit after use  #1 x 0   Entered and Authorized by:   Marga Melnick MD   Signed by:   Marga Melnick MD on 02/11/2010   Method used:   Samples Given   RxID:   [id number]

## 2010-12-29 NOTE — Cardiovascular Report (Signed)
Summary: Office Visit  Office Visit   Imported By: Marylou Mccoy 08/04/2010 17:06:14  _____________________________________________________________________  External Attachment:    Type:   Image     Comment:   External Document

## 2010-12-29 NOTE — Cardiovascular Report (Signed)
Summary: Implant Record  Implant Record   Imported By: Marylou Mccoy 06/16/2010 18:40:47  _____________________________________________________________________  External Attachment:    Type:   Image     Comment:   External Document

## 2010-12-29 NOTE — Assessment & Plan Note (Signed)
Summary: Iron deficiency anemia   History of Present Illness Primary GI MD: Yancey Flemings MD Primary Quintel Mccalla: Marga Melnick MD Requesting Grabiela Wohlford: Marga Melnick, MD Chief Complaint: Iron def anemia, discuss recall colon History of Present Illness:   60 year old white male with multiple medical problems including obesity, hypertension, hyperlipidemia, diabetes mellitus, hypothyroidism, nonischemic cardiomyopathy, syncope secondary to ventricular tachycardia status post pacemaker/AICD placement, and GERD. The patient is sent today regarding iron deficiency anemia. He was evaluated in February 2001 for reflux disease and a mild normocytic anemia. Colonoscopy at that time was normal except for sigmoid diverticulosis. Upper endoscopy at that time was normal. He has not been seen since. He was sent a routine recall letter the first of this year. His scheduling of the procedure was interrupted by medical problems related to his heart that necessitated hospitalization. Overall, he is feeling better. He is due for cardiology followup next week. His recent chief complaint was fatigue. Evaluation of laboratories from May 26, 2010 reveals a hemoglobin of 9.9 with an MCV of 86.6. Decreased iron saturation at 5.7% with normal B12 and folate levels. He has since been placed on iron therapy and GI consultation arranged. His GI review of systems is really unremarkable. He denies reflux symptoms. He is on no acid suppressant medication. He does take regular aspirin daily as well as rare Aleve. No melena or hematochezia. No abdominal pain. He had some weight loss when he was ill but has gained some weight back recently.   GI Review of Systems      Denies abdominal pain, acid reflux, belching, bloating, chest pain, dysphagia with liquids, dysphagia with solids, heartburn, loss of appetite, nausea, vomiting, vomiting blood, weight loss, and  weight gain.      Reports diverticulosis.     Denies anal fissure, black  tarry stools, change in bowel habit, constipation, diarrhea, fecal incontinence, heme positive stool, hemorrhoids, irritable bowel syndrome, jaundice, light color stool, liver problems, rectal bleeding, and  rectal pain.    Current Medications (verified): 1)  Metformin Hcl 1000 Mg Tabs (Metformin Hcl) .Marland Kitchen.. 1 By Mouth Two Times A Day 2)  Glimepiride 2 Mg Tabs (Glimepiride) .Marland Kitchen.. 1 Once Daily 3)  Cozaar 25 Mg Tabs (Losartan Potassium) .Marland Kitchen.. 1 By Mouth Once Daily 4)  Simvastatin 40 Mg  Tabs (Simvastatin) .Marland Kitchen.. 1 Qhs 5)  Levothroid 50 Mcg  Tabs (Levothyroxine Sodium) .Marland Kitchen.. 1 Once Daily 6)  Vit E .... Prn 7)  Fish Oil .... Prn 8)  Aspirin 325 Mg Tabs (Aspirin) .Marland Kitchen.. 1 By Mouth Once Daily 9)  Multivitamin 10)  Niacin Cr 500 Mg Cr-Caps (Niacin) .Marland Kitchen.. 1 By Mouth Once Daily 11)  Januvia 100 Mg Tabs (Sitagliptin Phosphate) .Marland Kitchen.. 1 Once Daily 12)  Wavesense Ultra-Thin Lancets  Misc (Lancets) .... 250.00 Check Bloodsugar Once Daily 13)  Wavesense Presto W/device Kit (Blood Glucose Monitoring Suppl) .... 250.00 Check Bs One Time Daily 14)  Wavesense Presto Test  Strp (Glucose Blood) .... 250.00, Check Bs Once Daily 15)  Amiodarone Hcl 200 Mg Tabs (Amiodarone Hcl) .Marland Kitchen.. 1 By Mouth Every 12 Hours 16)  Carvedilol 6.25 Mg Tabs (Carvedilol) .Marland Kitchen.. 1 By Mouth Two Times A Day With Meals 17)  Furosemide 40 Mg Tabs (Furosemide) .Marland Kitchen.. 1 By Mouth Once Daily 18)  Potassium Chloride Otc .Marland Kitchen.. 1 By Mouth Once Daily 19)  Ferrous Sulfate 325 (65 Fe) Mg Tabs (Ferrous Sulfate) .Marland Kitchen.. 1 By Mouth Two Times A Day 20)  Qvar 80 Mcg/act  Aers (Beclomethasone Dipropionate) .... Two  Puffs  Twice Daily.  Rinse Mouth Well After Each Use 21)  Proair Hfa 108 (90 Base) Mcg/act  Aers (Albuterol Sulfate) .... 2 Puffs Every 4-6 Hours As Needed  Allergies (verified): 1)  * Altace  Past History:  Past Medical History: Reviewed history from 07/08/2010 and no changes required. Diabetes mellitus, type  II Hyperlipidemia Hypertension Hypothyroidism carpal tunnel syndrome nonischemic CM--EF 15% h/o VT..s/p biventricular ICD Anemia  Past Surgical History: Reviewed history from 05/01/2010 and no changes required. PTX traumatic age 38 months  multiple fractures Vasectomy colonoscopy neg 2002 ICD 03/2010; Sustained Ventricular Tachycardia with Syncope; non ischemic Cardiomyopathy with EF 15 %; mild MR, Dr Ladona Ridgel  Family History: Reviewed history from 12/21/2007 and no changes required. mgf dm maternal aunts diabetes maternal aunt colon cancer maternal uncle lung ca  paternal uncle lung cance sister asthma father a fib  Social History: Reviewed history from 05/01/2010 and no changes required. Former Smoker.  started at age 58.  2 1/2 ppd.  quit 1971. pt is married and lives with spouse. pt has children. 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  Review of Systems       The patient complains of anemia.  The patient denies allergy/sinus, anxiety-new, arthritis/joint pain, back pain, blood in urine, breast changes/lumps, change in vision, confusion, cough, coughing up blood, depression-new, fainting, fatigue, fever, headaches-new, hearing problems, heart murmur, heart rhythm changes, itching, menstrual pain, muscle pains/cramps, night sweats, nosebleeds, pregnancy symptoms, shortness of breath, skin rash, sleeping problems, sore throat, swelling of feet/legs, swollen lymph glands, thirst - excessive , urination - excessive , urination changes/pain, urine leakage, vision changes, and voice change.    Vital Signs:  Patient profile:   60 year old male Height:      75 inches Weight:      269.13 pounds BMI:     33.76 Pulse rate:   80 / minute Pulse rhythm:   regular BP sitting:   124 / 78  (right arm) Cuff size:   large  Vitals Entered By: Christie Nottingham CMA Duncan Dull) (July 14, 2010 9:25 AM)  Physical Exam  General:  Well developed, obese,well  nourished, no acute distress. Head:  Normocephalic and atraumatic. Eyes:  PERRLA, no icterus. Ears:  Normal auditory acuity. Nose:  No deformity, discharge,  or lesions. Mouth:  No deformity or lesions Neck:  Supple; no masses or thyromegaly. Lungs:  Clear throughout to auscultation. Heart:  Regular rate and rhythm; no murmurs, rubs,  or bruits. Abdomen:  Soft,obese, nontender and nondistended. No masses, hepatosplenomegaly or hernias noted. Normal bowel sounds. Rectal:  deferred until colonoscopy Msk:  Symmetrical with no gross deformities. Normal posture. Pulses:  Normal pulses noted. Extremities:  No clubbing, cyanosis, edema or deformities noted. Neurologic:  Alert and  oriented x4;  grossly normal neurologically. Skin:  Intact without significant lesions or rashes. Psych:  Alert and cooperative. Normal mood and affect.   Impression & Recommendations:  Problem # 1:  ANEMIA, IRON DEFICIENCY (ICD-280.9) iron deficiency anemia. Rule out occult GI lesion such as erosions, ulcer, AVMs, or neoplasia.  Plan: #1. Colonoscopy and upper endoscopy. The nature of the procedures as well as the risks, benefits, and alternatives were reviewed. He understood and agreed to proceed. Given the patient's comorbidities, he is HIGH RISK. We will seek cardiology clearance for his procedure work #2. Movi prep prescribed. The patient instructed on its use #3. Continue iron. Hold one week prior to procedures #4. Hold diabetic medications  the morning of his procedure until he resumes normal oral intake. This to avoid unwanted hypoglycemia. We will monitor his blood sugar immediately before and after his procedures  Problem # 2:  GERD (ICD-530.81) asymptomatic off medication. Reflux precautions recommended  Problem # 3:  DIABETES MELLITUS, CONTROLLED (ICD-250.00) adjustment of medications for his procedure as outlined above  Other Orders: Colon/Endo (Colon/Endo)  Patient Instructions: 1)  Colon/Endo  LEC 08/12/10 3:00 pm arrive at 2:00 pm 2)  Movi  prep instructions given to patient. 3)  Movi prep Rx. sent to pharmacy. 4)  Colonoscopy and Flexible Sigmoidoscopy brochure given.  5)  Upper Endoscopy brochure given.  6)  Hold Iron x 1 week prior 7)  Hold Diabetic meds morning of procedure. 8)  Copy sent to : Marga Melnick, MD, Dr. Gilman Schmidt 9)  The medication list was reviewed and reconciled.  All changed / newly prescribed medications were explained.  A complete medication list was provided to the patient / caregiver. Prescriptions: MOVIPREP 100 GM  SOLR (PEG-KCL-NACL-NASULF-NA ASC-C) As per prep instructions.  #1 x 0   Entered by:   Milford Cage NCMA   Authorized by:   Hilarie Fredrickson MD   Signed by:   Milford Cage NCMA on 07/14/2010   Method used:   Electronically to        [facility] (retail)       [address]       [place], [place]  [address]       Ph: [phone]       Fax: [phone]   RxID:   [id number]

## 2010-12-29 NOTE — Medication Information (Signed)
Summary: Physician's Orders   Physician's Orders   Imported By: Roderic Ovens 06/24/2010 14:29:23  _____________________________________________________________________  External Attachment:    Type:   Image     Comment:   External Document

## 2010-12-29 NOTE — Cardiovascular Report (Signed)
Summary: Office Visit   Office Visit   Imported By: Roderic Ovens 05/28/2010 12:42:29  _____________________________________________________________________  External Attachment:    Type:   Image     Comment:   External Document

## 2010-12-29 NOTE — Progress Notes (Signed)
Summary: dentist needs protocal and rx for abx for pt  Phone Note Call from Patient   Caller: Patient Reason for Call: Talk to Nurse Summary of Call: pt going to the dentist on 7-20 for a deep cleaning-they told him they need a protocal from [place] as well as an abx-pt #[phone]-[facility] fax #[phone]-[facility] Initial call taken by: Glynda Jaeger,  June 15, 2010 10:22 AM  Follow-up for Phone Call        per Dr Ladona Ridgel will only need coverage with antibiotics for another month.  Called Amoxicillin [facility]mg  into [facility].  Take 4 tablets one hout prior to procedure.  Pt aware and will fax over order to dentist. Dennis Bast, RN, BSN  June 15, 2010 10:56 AM    New/Updated Medications: AMOXICILLIN 500 MG TABS (AMOXICILLIN) take 4 tablets one hour prior to procedure Prescriptions: AMOXICILLIN 500 MG TABS (AMOXICILLIN) take 4 tablets one hour prior to procedure  #4 x 0   Entered by:   Dennis Bast, RN, BSN   Authorized by:   Laren Boom, MD, [facility]   Signed by:   Dennis Bast, RN, BSN on 06/15/2010   Method used:   Electronically to        [facility] (retail)       [address]       [place], [place]  [address]       Ph: [phone]       Fax: [phone]   RxID:   [id number]

## 2010-12-29 NOTE — Progress Notes (Signed)
Summary: Refill Request  Phone Note Refill Request Call back at [phone] Message from:  Pharmacy on May 11, 2010 2:00 PM  Refills Requested: Medication #1:  GLIMEPIRIDE 4 MG TABS Take 1 tablet by mouth once a day   Dosage confirmed as above?Dosage Confirmed   Supply Requested: 1 month [facility]  Next Appointment Scheduled: July 6th 2011 Initial call taken by: Harold Barban,  May 11, 2010 2:01 PM    Prescriptions: GLIMEPIRIDE 4 MG TABS (GLIMEPIRIDE) Take 1 tablet by mouth once a day  #30 x 3   Entered by:   Shonna Chock   Authorized by:   Marga Melnick MD   Signed by:   Shonna Chock on 05/11/2010   Method used:   Electronically to        [facility] (retail)       [address]       [place], [place]  [address]       Ph: [phone]       Fax: [phone]   RxID:   [id number]

## 2010-12-29 NOTE — Progress Notes (Signed)
Summary: Chest Xray Results  Phone Note Outgoing Call   Call placed by: Shonna Chock,  Apr 08, 2010 5:00 PM Call placed to: Patient Summary of Call: Spoke with patient about Chest Xray Rsults: (Per Dr.Hopper) Suboptimal Inflation at base, blow up 6-10 ballons a day to optimize inflation of lungs. OV if symptoms not improving. Patient ok'd instruction (copy of report mailed)   Shonna Chock  Apr 08, 2010 5:03 PM

## 2010-12-29 NOTE — Procedures (Signed)
Summary: Upper Endoscopy  Patient: Mike Jimenez Note: All result statuses are Final unless otherwise noted.  Tests: (1) Upper Endoscopy (EGD)   EGD Upper Endoscopy       DONE (C)     [facility]     [address]     [place], [place]  [address]           ENDOSCOPY PROCEDURE REPORT           PATIENT:  [name], [name]  MR#:  [id number]     BIRTHDATE:  Apr 09, 1951, 59 yrs. old  GENDER:  male           ENDOSCOPIST:  Davonne Jarnigan. Eda Keys, MD     Referred by:  Office           PROCEDURE DATE:  08/12/2010     PROCEDURE:  EGD with biopsy     ASA CLASS:  Class III     INDICATIONS:  iron deficiency anemia           MEDICATIONS:   There was residual sedation effect present from     prior procedure., Versed 2 mg IV     TOPICAL ANESTHETIC:  Exactacain Spray           DESCRIPTION OF PROCEDURE:   After the risks benefits and     alternatives of the procedure were thoroughly explained, informed     consent was obtained.  The LB GIF-H180 [id number] endoscope was     introduced through the mouth and advanced to the third portion of     the duodenum, without limitations.  The instrument was slowly     withdrawn as the mucosa was fully examined.     <<PROCEDUREIMAGES>>           The esophagus and gastroesophageal junction were completely normal     in appearance.  A 5mm ulcer was found in the antrum.  Multiple     erosions were found in the antrum.  The duodenal bulb was normal     in appearance, as was the postbulbar duodenum.    Retroflexed     views revealed no abnormalities.CLO bx taken.    The scope was     then withdrawn from the patient and the procedure completed.           COMPLICATIONS:  None           ENDOSCOPIC IMPRESSION:     1) Normal esophagus     2) Ulcer in the antrum     3) Erosions, multiple in the antrum     4) Normal duodenum           RECOMMENDATIONS:     1) Rx CLO if positive     2) Omeprazole 20 mg po qd ; #30; 11 refills           _____________________________     [name]. Eda Keys, MD           CC:  Pecola Lawless, MD, The Patient           n.     REVISED:  08/12/2010 05:11 PM     eSIGNED:   [name]. Eda Keys at 08/12/2010 05:11 PM           [name], [id number]  Note: An exclamation mark (!) indicates a result that was not dispersed into the flowsheet. Document Creation Date: 08/12/2010 5:12 PM _______________________________________________________________________  (1) Order result status: Final  Collection or observation date-time: 08/12/2010 16:45 Requested date-time:  Receipt date-time:  Reported date-time:  Referring Physician:   Ordering Physician: Fransico Setters [phone]) Specimen Source:  Source: Launa Grill Order Number: [id number] Lab site:

## 2010-12-29 NOTE — Assessment & Plan Note (Signed)
Summary: rov for asthma   Primary Provider/Referring Provider:  Marga Melnick MD  CC:  3 wk followup with PFT's.  Pt states that he feels fine off of the symbicort.  He c/o dry cough and hoarsness that "comes and goes"- states that this started approx 1 wk ago. He relates this to PND.Marland Kitchen  History of Present Illness: The pt comes in today for f/u after his recent pfts off medications to assess for possible asthma.  Since the last visit, he has noted a little more cough that he suspects is due to PND, but has not seen a change in his actual breathing.  His pfts today show mild to moderate airflow obstruction, with a good response to bronchodilators.  He has no restriction on lung volumes, but does have mild airtrapping.  His DLCO is mildly reduced.  I have reviewed the results with him in detail, and answered all of his questions.  Current Medications (verified): 1)  Metformin Hcl 1000 Mg Tabs (Metformin Hcl) .Marland Kitchen.. 1 By Mouth Two Times A Day 2)  Glimepiride 2 Mg Tabs (Glimepiride) .Marland Kitchen.. 1 Once Daily 3)  Cozaar 25 Mg Tabs (Losartan Potassium) .Marland Kitchen.. 1 By Mouth Once Daily 4)  Simvastatin 40 Mg  Tabs (Simvastatin) .Marland Kitchen.. 1 Qhs 5)  Levothroid 50 Mcg  Tabs (Levothyroxine Sodium) .Marland Kitchen.. 1 Once Daily 6)  Vit E .... Prn 7)  Fish Oil .... Prn 8)  Aspirin 325 Mg Tabs (Aspirin) .Marland Kitchen.. 1 By Mouth Once Daily 9)  Multivitamin 10)  Niacin Cr 500 Mg Cr-Caps (Niacin) .Marland Kitchen.. 1 By Mouth Once Daily 11)  Januvia 100 Mg Tabs (Sitagliptin Phosphate) .Marland Kitchen.. 1 Once Daily 12)  Wavesense Ultra-Thin Lancets  Misc (Lancets) .... 250.00 Check Bloodsugar Once Daily 13)  Wavesense Presto W/device Kit (Blood Glucose Monitoring Suppl) .... 250.00 Check Bs One Time Daily 14)  Wavesense Presto Test  Strp (Glucose Blood) .... 250.00, Check Bs Once Daily 15)  Amiodarone Hcl 200 Mg Tabs (Amiodarone Hcl) .Marland Kitchen.. 1 By Mouth Every 12 Hours 16)  Carvedilol 6.25 Mg Tabs (Carvedilol) .Marland Kitchen.. 1 By Mouth Two Times A Day With Meals 17)  Furosemide 40 Mg Tabs  (Furosemide) .Marland Kitchen.. 1 By Mouth Once Daily 18)  Potassium Chloride Otc .Marland Kitchen.. 1 By Mouth Once Daily 19)  Ferrous Sulfate 325 (65 Fe) Mg Tabs (Ferrous Sulfate) .Marland Kitchen.. 1 By Mouth Two Times A Day  Allergies (verified): 1)  * Altace  Review of Systems       The patient complains of non-productive cough.  The patient denies shortness of breath with activity, shortness of breath at rest, productive cough, coughing up blood, chest pain, irregular heartbeats, acid heartburn, indigestion, loss of appetite, weight change, abdominal pain, difficulty swallowing, sore throat, tooth/dental problems, headaches, nasal congestion/difficulty breathing through nose, sneezing, itching, ear ache, anxiety, depression, hand/feet swelling, joint stiffness or pain, rash, change in color of mucus, and fever.    Vital Signs:  Patient profile:   60 year old male Weight:      262 pounds O2 Sat:      95 % on Room air Temp:     98.2 degrees F oral Pulse rate:   77 / minute BP sitting:   120 / 70  (left arm) Cuff size:   large  Vitals Entered By: Vernie Murders (June 03, 2010 1:59 PM)  O2 Flow:  Room air  Physical Exam  General:  ow male in nad Nose:  no obvious drainage or purulence Lungs:  totally clear to auscultation  Heart:  rrr Extremities:  no edema or cyanosis Neurologic:  alert and oriented, moves all 4.   Impression & Recommendations:  Problem # 1:  ASTHMA (ICD-493.90) the pt has partially reversible airflow obstruction on pfts today, and this is most c/w asthma.  He has smoked for less than a year in the past, and therefore not related to emphysema.  It may be worth checking an AAT?, although pt had asthma as a child.  I have had a long discussion with him regarding the pathophysiology of asthma, and how we much treat the underlying airway inflammation in order to prevent worsening of airflow obstruction over time and to have symptom control.  That means he must take his medication everyday, regardless of  how good he feels.  Will start the pt on ICS alone and see how he does.  Medications Added to Medication List This Visit: 1)  Glimepiride 2 Mg Tabs (Glimepiride) .Marland Kitchen.. 1 once daily 2)  Qvar 80 Mcg/act Aers (Beclomethasone dipropionate) .... Two  puffs twice daily.  rinse mouth well after each use 3)  Proair Hfa 108 (90 Base) Mcg/act Aers (Albuterol sulfate) .... 2 puffs every 4-6 hours as needed  Other Orders: Est. Patient Level IV ([address])  Patient Instructions: 1)  start qvar 2 inhalations in am and pm...rinse mouth well. 2)  can use albuterol inhaler 2 inhalations up to every 6 hours if needed for breakthru symptoms.  If you are using more than 2 times a week, this is considered inadequate control. 3)  please call me in 4 weeks to update progress, and followup with me formally in 3mos.   Prescriptions: PROAIR HFA 108 (90 BASE) MCG/ACT  AERS (ALBUTEROL SULFATE) 2 puffs every 4-6 hours as needed  #1 x 6   Entered and Authorized by:   Barbaraann Share MD   Signed by:   Barbaraann Share MD on 06/03/2010   Method used:   Print then Give to Patient   RxID:   [id number] QVAR 80 MCG/ACT  AERS (BECLOMETHASONE DIPROPIONATE) Two  puffs twice daily.  rinse mouth well after each use  #1 x 6   Entered and Authorized by:   Barbaraann Share MD   Signed by:   Barbaraann Share MD on 06/03/2010   Method used:   Print then Give to Patient   RxID:   [id number]

## 2010-12-29 NOTE — Consult Note (Signed)
Summary: [facility]  [facility]   Imported By: Lanelle Bal 04/28/2010 14:01:18  _____________________________________________________________________  External Attachment:    Type:   Image     Comment:   External Document

## 2010-12-29 NOTE — Assessment & Plan Note (Signed)
Summary: 6 month roa//lch   Vital Signs:  Patient profile:   60 year old male Weight:      275.6 pounds Temp:     98.4 degrees F oral Pulse rate:   72 / minute Resp:     14 per minute BP sitting:   140 / 84  (left arm) Cuff size:   large  Vitals Entered By: Shonna Chock CMA (August 17, 2010 9:18 AM) CC: 6 month follow-up, Type 2 diabetes mellitus follow-up   Primary Care Provider:  Marga Melnick MD  CC:  6 month follow-up and Type 2 diabetes mellitus follow-up.  History of Present Illness: Type 2 Diabetes Mellitus Follow-Up   He has had polyuria only with his  diuretic . He describes  self managed hypoglycemia X 3, weight gain of 8#, and numbness of 5th L finger ( since defibrillator inserted), but denies polydipsia and blurred vision.  The patient denies the following symptoms: neuropathic pain, chest pain, vomiting, orthostatic symptoms, poor wound healing, intermittent claudication, vision loss, and foot ulcer.  Since the last visit the patient reports good dietary compliance and exercising regularly.  The patient has been measuring capillary blood glucose before breakfast ( 90-110)and  2 hrs after breakfast(<140-150).  Since the last visit, the patient reports having had eye care by Dr Sherryle Lis. Labs reviewed & risks discussed. A1c now well within NON DM range.See BP ; it averages 120/80 @ home.  Current Medications (verified): 1)  Metformin Hcl 1000 Mg Tabs (Metformin Hcl) .Marland Kitchen.. 1 By Mouth Two Times A Day 2)  Glimepiride 2 Mg Tabs (Glimepiride) .Marland Kitchen.. 1 Once Daily 3)  Cozaar 25 Mg Tabs (Losartan Potassium) .Marland Kitchen.. 1 By Mouth Once Daily 4)  Simvastatin 40 Mg  Tabs (Simvastatin) .Marland Kitchen.. 1 Qhs 5)  Levothroid 50 Mcg  Tabs (Levothyroxine Sodium) .Marland Kitchen.. 1 Once Daily 6)  Vit E .... Prn 7)  Fish Oil .... Prn 8)  Aspirin 325 Mg Tabs (Aspirin) .Marland Kitchen.. 1 By Mouth Once Daily 9)  Multivitamin 10)  Niacin Cr 500 Mg Cr-Caps (Niacin) .Marland Kitchen.. 1 By Mouth Once Daily 11)  Januvia 100 Mg Tabs (Sitagliptin  Phosphate) .Marland Kitchen.. 1 Once Daily 12)  Wavesense Ultra-Thin Lancets  Misc (Lancets) .... 250.00 Check Bloodsugar Once Daily 13)  Wavesense Presto W/device Kit (Blood Glucose Monitoring Suppl) .... 250.00 Check Bs One Time Daily 14)  Wavesense Presto Test  Strp (Glucose Blood) .... 250.00, Check Bs Once Daily 15)  Amiodarone Hcl 200 Mg Tabs (Amiodarone Hcl) .Marland Kitchen.. 1 By Mouth in Am and 1/2 Tablet in Then Pm 16)  Carvedilol 6.25 Mg Tabs (Carvedilol) .Marland Kitchen.. 1 By Mouth Two Times A Day With Meals 17)  Furosemide 40 Mg Tabs (Furosemide) .Marland Kitchen.. 1 By Mouth Once Daily 18)  Potassium Chloride Otc .Marland Kitchen.. 1 By Mouth Once Daily 19)  Ferrous Sulfate 325 (65 Fe) Mg Tabs (Ferrous Sulfate) .Marland Kitchen.. 1 By Mouth Two Times A Day 20)  Omeprazole 20 Mg  Cpdr (Omeprazole) .Marland Kitchen.. 1 Each Day 30 Minutes Before Meal  Allergies: 1)  * Altace  Physical Exam  General:  well-nourished,alert,appropriate and cooperative throughout examination Lungs:  Normal respiratory effort, chest expands symmetrically. Lungs are clear to auscultation, no crackles or wheezes. Heart:  normal rate, regular rhythm, no gallop, no rub, no JVD, no HJR, and grade  1/2 /6 systolic murmur.   Abdomen:  Bowel sounds positive,abdomen soft and non-tender without masses, organomegaly or hernias noted. Pulses:  R and L carotid,radial,dorsalis pedis and posterior tibial pulses are full and equal  bilaterally Extremities:  No clubbing, cyanosis, edema. Good nail health  Neurologic:  alert & oriented X3 and sensation intact to light touch over feet   Skin:  Intact without suspicious lesions or rashes Psych:  Focused & intelligent.   Impression & Recommendations:  Problem # 1:  DIABETES MELLITUS, CONTROLLED (ICD-250.00) A1c now in NON DM range The following medications were removed from the medication list:    Glimepiride 2 Mg Tabs (Glimepiride) .Marland Kitchen... 1 once daily His updated medication list for this problem includes:    Metformin Hcl 1000 Mg Tabs (Metformin hcl) .Marland Kitchen...  1 by mouth two times a day    Cozaar 25 Mg Tabs (Losartan potassium) .Marland Kitchen... 1 by mouth once daily    Aspirin 325 Mg Tabs (Aspirin) .Marland Kitchen... 1 by mouth once daily    Januvia 100 Mg Tabs (Sitagliptin phosphate) .Marland Kitchen... 1 once daily  Problem # 2:  HYPERTENSION (ICD-401.9) controlled as per history His updated medication list for this problem includes:    Cozaar 25 Mg Tabs (Losartan potassium) .Marland Kitchen... 1 by mouth once daily    Carvedilol 6.25 Mg Tabs (Carvedilol) .Marland Kitchen... 1 by mouth two times a day with meals    Furosemide 40 Mg Tabs (Furosemide) .Marland Kitchen... 1 by mouth once daily  Complete Medication List: 1)  Metformin Hcl 1000 Mg Tabs (Metformin hcl) .Marland Kitchen.. 1 by mouth two times a day 2)  Cozaar 25 Mg Tabs (Losartan potassium) .Marland Kitchen.. 1 by mouth once daily 3)  Simvastatin 40 Mg Tabs (Simvastatin) .Marland Kitchen.. 1 qhs 4)  Levothroid 50 Mcg Tabs (levothyroxine Sodium)  .Marland Kitchen.. 1 once daily 5)  Vit E  .... Prn 6)  Fish Oil  .... Prn 7)  Aspirin 325 Mg Tabs (Aspirin) .Marland Kitchen.. 1 by mouth once daily 8)  Multivitamin  9)  Niacin Cr 500 Mg Cr-caps (Niacin) .Marland Kitchen.. 1 by mouth once daily 10)  Januvia 100 Mg Tabs (Sitagliptin phosphate) .Marland Kitchen.. 1 once daily 11)  Wavesense Ultra-thin Lancets Misc (Lancets) .... 250.00 check bloodsugar once daily 12)  Katheren Puller Presto W/device Kit (Blood glucose monitoring suppl) .... 250.00 check bs one time daily 13)  Wavesense Presto Test Strp (Glucose blood) .... 250.00, check bs once daily 14)  Amiodarone Hcl 200 Mg Tabs (Amiodarone hcl) .Marland Kitchen.. 1 by mouth in am and 1/2 tablet in then pm 15)  Carvedilol 6.25 Mg Tabs (Carvedilol) .Marland Kitchen.. 1 by mouth two times a day with meals 16)  Furosemide 40 Mg Tabs (Furosemide) .Marland Kitchen.. 1 by mouth once daily 17)  Potassium Chloride Otc  .Marland Kitchen.. 1 by mouth once daily 18)  Ferrous Sulfate 325 (65 Fe) Mg Tabs (Ferrous sulfate) .Marland Kitchen.. 1 by mouth two times a day 19)  Omeprazole 20 Mg Cpdr (Omeprazole) .Marland Kitchen.. 1 each day 30 minutes before meal  Other Orders: Admin 1st Vaccine ([address]) Flu  Vaccine 73yrs + ([address]) Flu Vaccine Consent Questions     Do you have a history of severe allergic reactions to this vaccine? no    Any prior history of allergic reactions to egg and/or gelatin? no    Do you have a sensitivity to the preservative Thimersol? no    Do you have a past history of Guillan-Barre Syndrome? no    Do you currently have an acute febrile illness? no    Have you ever had a severe reaction to latex? no    Vaccine information given and explained to patient? yes    Are you currently pregnant? no    Lot Number:[id number]   Exp Date:05/28/2010  Site Given  Left Deltoid IMOrders: Admin 1st Vaccine ([address]) Flu Vaccine 72yrs + ([address])  Patient Instructions: 1)  Stop Glimiperide . 2)  Please schedule a follow-up appointment in 3 months. 3)  Check your blood sugars regularly. If your readings are usually above :150 or below 90 or > 180  two hrs after meals  you should contact our office. 4)  Check your Blood Pressure regularly. If it is above: 135/85 ON AVERAGE you should make an appointment. 5)  HbgA1C prior to visit, ICD-9:250.00. Prescriptions: METFORMIN HCL 1000 MG TABS (METFORMIN HCL) 1 by mouth two times a day  #180 x 1   Entered and Authorized by:   Marga Melnick MD   Signed by:   Marga Melnick MD on 08/17/2010   Method used:   Faxed to ...       [facility] (retail)       [address]       [place], [place]  [address]       Ph: [phone]       Fax: [phone]   RxID:   [id number]  .lbflu

## 2010-12-29 NOTE — Medication Information (Signed)
Summary: Prior Authorization & Approval for Viagra/[employer]  Prior Authorization & Approval for Viagra/[employer]   Imported By: Lanelle Bal 04/03/2010 10:06:22  _____________________________________________________________________  External Attachment:    Type:   Image     Comment:   External Document

## 2010-12-29 NOTE — Letter (Signed)
Summary: Diabetic Instructions  [facility]  [address] [place], [place] [address]   Phone: [phone]  Fax: [phone]    Mike Jimenez 1951/06/13 MRN: [id number]   x ORAL DIABETIC MEDICATION INSTRUCTIONS  The day before your procedure:   Take your diabetic pill as you do normally  The day of your procedure:   Do not take your diabetic pill    We will check your blood sugar levels during the admission process and again in Recovery before discharging you home  ________________________________________________________________________  _  _   INSULIN (LONG ACTING) MEDICATION INSTRUCTIONS (Lantus, NPH, 70/30, Humulin, Novolin-N)   The day before your procedure:   Take  your regular evening dose    The day of your procedure:   Do not take your morning dose    _  _   INSULIN (SHORT ACTING) MEDICATION INSTRUCTIONS (Regular, Humulog, Novolog)   The day before your procedure:   Do not take your evening dose   The day of your procedure:   Do not take your morning dose   _  _   INSULIN PUMP MEDICATION INSTRUCTIONS  We will contact the physician managing your diabetic care for written dosage instructions for the day before your procedure and the day of your procedure.  Once we have received the instructions, we will contact you.

## 2010-12-29 NOTE — Letter (Signed)
Summary: Patient Notice- Polyp Results  [facility]  [address] [place], [place] [address]   Phone: [phone]  Fax: [phone]        August 16, 2010 MRN: [id number]    [facility] [address] [place], [place]  [address]    Dear Mr. Verbrugge,  I am pleased to inform you that the colon polyp(s) removed during your recent colonoscopy was (were) found to be benign (no cancer detected) upon pathologic examination.  I recommend you have a repeat colonoscopy examination in 5 years to look for recurrent polyps, as having colon polyps increases your risk for having recurrent polyps or even colon cancer in the future.  Should you develop new or worsening symptoms of abdominal pain, bowel habit changes or bleeding from the rectum or bowels, please schedule an evaluation with either your primary care physician or with me.  Additional information/recommendations:  __ No further action with gastroenterology is needed at this time. Please      follow-up with your primary care physician for your other healthcare      needs.   Please call us if you are having persistent problems or have questions about your condition that have not been fully answered at this time.  Sincerely,  Hilarie Fredrickson MD  This letter has been electronically signed by your physician.  Appended Document: Patient Notice- Polyp Results letter mailed

## 2010-12-29 NOTE — Assessment & Plan Note (Signed)
Summary: consult for ?asthma   Copy to:  Marga Melnick Primary Provider/Referring Provider:  Marga Melnick MD  CC:  Pulmonary Consult.  History of Present Illness: The pt is a 60y/o male who I have been asked to see for pulmonary issues.  The pt has a known cardiomyopathy, and was recently in the hospital for acute congestive heart failure.  He also had recent congestion and cough about the same time, and started on asthma medications.  He currently is on symbicort with as needed ventolin.  The pt has a h/o childhood asthma, but has not had any issues since that time unless he had a bad "chest cold".  He has not had any recent episodes that he would classify as an "asthma attack".  He currently feels that he is breathing well, but does note mucus in his throat that is making him hoarse.  He is doing a lot of throat clearing.  He admits to having postnasal drip, but feels he is rinsing well after using symbicort.  Of note, he has been started on amiodarone last month, and has not had baseline pfts.  His most recent cxr shows mild vascular congestion due to edema, but no infiltrate.  Current Medications (verified): 1)  Viagra 100 Mg Tabs (Sildenafil Citrate) .... Prn 2)  Metformin Hcl 1000 Mg Tabs (Metformin Hcl) .Marland Kitchen.. 1 By Mouth Two Times A Day 3)  Glimepiride 4 Mg Tabs (Glimepiride) .... Take 1 Tablet By Mouth Once A Day 4)  Cozaar 25 Mg Tabs (Losartan Potassium) .Marland Kitchen.. 1 By Mouth Once Daily 5)  Simvastatin 40 Mg  Tabs (Simvastatin) .Marland Kitchen.. 1 Qhs 6)  Levothroid 50 Mcg  Tabs (Levothyroxine Sodium) .Marland Kitchen.. 1 Once Daily 7)  Vit E .... Prn 8)  Fish Oil .... Prn 9)  Aspirin 325 Mg Tabs (Aspirin) .Marland Kitchen.. 1 By Mouth Once Daily 10)  Multivitamin 11)  Niacin Cr 500 Mg Cr-Caps (Niacin) .Marland Kitchen.. 1 By Mouth Once Daily 12)  Januvia 100 Mg Tabs (Sitagliptin Phosphate) .Marland Kitchen.. 1 Once Daily 13)  Ventolin Hfa 108 (90 Base) Mcg/act Aers (Albuterol Sulfate) .Marland Kitchen.. 1-2 Puffs Every 4 Hrs As Needed Cough ,sob 14)  Symbicort 160-4.5  Mcg/act Aero (Budesonide-Formoterol Fumarate) .... Inhale 2 Puffs Once A Day 15)  Promethazine Vc/codeine 6.25-5-10 Mg/13ml Syrp (Phenyleph-Promethazine-Cod) .Marland Kitchen.. 1 Tsp Every 6 Hrs As Needed For Cough 16)  Wavesense Ultra-Thin Lancets  Misc (Lancets) .... 250.00 Check Bloodsugar Once Daily 17)  Wavesense Presto W/device Kit (Blood Glucose Monitoring Suppl) .... 250.00 Check Bs One Time Daily 18)  Wavesense Presto Test  Strp (Glucose Blood) .... 250.00, Check Bs Once Daily 19)  Amiodarone Hcl 200 Mg Tabs (Amiodarone Hcl) .Marland Kitchen.. 1 By Mouth Every 12 Hours 20)  Carvedilol 6.25 Mg Tabs (Carvedilol) .Marland Kitchen.. 1 By Mouth Two Times A Day With Meals 21)  Furosemide 40 Mg Tabs (Furosemide) .Marland Kitchen.. 1 By Mouth Once Daily 22)  Mytussin Ac 100-10 Mg/8ml Syrp (Guaifenesin-Codeine) .Marland Kitchen.. 15 Milliters Every 6 Hours As Needed 23)  Lovaza 1 Gm Caps (Omega-3-Acid Ethyl Esters) .Marland Kitchen.. 1 By Mouth Three Times A Day 24)  Potassium Chloride Otc .Marland Kitchen.. 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 mgf dm maternal aunts diabetes maternal aunt colon cancer maternal uncle lung ca  paternal uncle lung cance sister asthma father a fib  Social History: Reviewed history from 12/21/2007 and no changes required. Former Smoker.  started at age 60.  2 1/2 ppd.  quit 1971. pt is married and lives with spouse. pt has children. 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  Review of Systems       The patient  complains of irregular heartbeats and weight change.  The patient denies shortness of breath with activity, shortness of breath at rest, productive cough, non-productive cough, coughing up blood, chest pain, acid heartburn, indigestion, loss of appetite, abdominal pain, difficulty swallowing, sore throat, tooth/dental problems, headaches, nasal congestion/difficulty breathing through nose, sneezing, itching, ear ache, anxiety, depression, hand/feet swelling, joint stiffness or pain, rash, change in color of mucus, and fever.    Vital Signs:  Patient profile:   60 year old male Height:      75 inches Weight:      260 pounds BMI:     32.62 O2 Sat:      91 % on Room air Temp:     97.6 degrees F oral Pulse rate:   75 / minute BP sitting:   118 / 60  (left arm) Cuff size:   large  Vitals Entered By: Arman Filter LPN (May 01, 980 9:13 AM)  O2 Flow:  Room air CC: Pulmonary Consult Comments Medications reviewed with patient Arman Filter LPN  May 01, 1913 9:19 AM    Physical Exam  General:  ow male in nad Eyes:  PERRLA and EOMI.   Nose:  deviated septum to left with weeping bilat. no crusting or purulence Mouth:  long palate and uvula Neck:  no jvd, tmg, LN Lungs:  clear to auscultation Heart:  rrr, no mrg Abdomen:  soft and nontender, bs+ Extremities:  minimal ankle edema, pulses intact no cyanosis Neurologic:  alert and oriented, moves all 4.   Impression & Recommendations:  Problem # 1:  ASTHMA (ICD-493.90) the pt has a h/o childhood asthma, but has not been bothered with it since.  He does have breathing issues associated with a "bad cold" over the years.  Currently, it is unclear if asthma has anything to do with his recent symptoms, and I suspect not.  He does have a lot of postnasal drip, and suspect his inhaler is contributing to his dysphonia as well.  Would like to discontinue symbicort, and have him get full pfts in 3 weeks to put this issue to rest.  He needs pfts  anyway for baseline given that he recently started on amiodarone.  He should get f/u pfts yearly while on this therapy.    Problem # 2:  PULMONARY CONGESTION AND HYPOSTASIS (ICD-514) the pt was recently in the hospital for chf, and the last cxr shows persistent vascular congestion.  Suspect this is going to be a chronic issue given the severity of his heart disease.    Medications Added to Medication List This Visit: 1)  Glimepiride 4 Mg Tabs (Glimepiride) .... Take 1 tablet by mouth once a day 2)  Symbicort 160-4.5 Mcg/act Aero (Budesonide-formoterol fumarate) .... Inhale 2 puffs once a day  Other Orders: Consultation Level IV ([address]) Pulmonary Referral (Pulmonary)  Patient Instructions: 1)  stop symbicort, but can take ventolin for emergency rescue  only 2)  will schedule for breathing tests in 3 weeks, and see you back on same day. 3)  can try chlorpheniramine 8mg  over the counter at bedtime for your postnasal drip

## 2010-12-29 NOTE — Letter (Signed)
Summary: [facility] Instructions  [facility]  [address] [place], [place] [address]   Phone: [phone]  Fax: [phone]       KASHAUN BEBO    06-03-1951    MRN: [id number]        Procedure Day /Date:WEDNESDAY 08/12/10     Arrival Time:2:00 PM     Procedure Time:3:00 PM     Location of Procedure:                    X  [facility] (4th Floor)   PREPARATION FOR COLONOSCOPY WITH MOVIPREP/ENDO   Starting 5 days prior to your procedure 08/07/10 do not eat nuts, seeds, popcorn, corn, beans, peas,  salads, or any raw vegetables.  Do not take any fiber supplements (e.g. Metamucil, Citrucel, and Benefiber).  THE DAY BEFORE YOUR PROCEDURE         DATE: 08/11/10 DAY: TUESDAY  1.  Drink clear liquids the entire day-NO SOLID FOOD  2.  Do not drink anything colored red or purple.  Avoid juices with pulp.  No orange juice.  3.  Drink at least 64 oz. (8 glasses) of fluid/clear liquids during the day to prevent dehydration and help the prep work efficiently.  CLEAR LIQUIDS INCLUDE: Water Jello Ice Popsicles Tea (sugar ok, no milk/cream) Powdered fruit flavored drinks Coffee (sugar ok, no milk/cream) Gatorade Juice: apple, white grape, white cranberry  Lemonade Clear bullion, consomm, broth Carbonated beverages (any kind) Strained chicken noodle soup Hard Candy                             4.  In the morning, mix first dose of MoviPrep solution:    Empty 1 Pouch A and 1 Pouch B into the disposable container    Add lukewarm drinking water to the top line of the container. Mix to dissolve    Refrigerate (mixed solution should be used within 24 hrs)  5.  Begin drinking the prep at 5:00 p.m. The MoviPrep container is divided by 4 marks.   Every 15 minutes drink the solution down to the next mark (approximately 8 oz) until the full liter is complete.   6.  Follow completed prep with 16 oz of clear liquid of your choice (Nothing red or purple).  Continue  to drink clear liquids until bedtime.  7.  Before going to bed, mix second dose of MoviPrep solution:    Empty 1 Pouch A and 1 Pouch B into the disposable container    Add lukewarm drinking water to the top line of the container. Mix to dissolve    Refrigerate  THE DAY OF YOUR PROCEDURE      DATE:08/12/10 ONG:[id number]  Beginning at _  _a.m. (5 hours before procedure):         1. Every 15 minutes, drink the solution down to the next mark (approx 8 oz) until the full liter is complete.  2. Follow completed prep with 16 oz. of clear liquid of your choice.    3. You may drink clear liquids until _  _ (2 HOURS BEFORE PROCEDURE).   MEDICATION INSTRUCTIONS  Unless otherwise instructed, you should take regular prescription medications with a small sip of water   as early as possible the morning of your procedure.  Diabetic patients - see separate instructions.         OTHER INSTRUCTIONS  You will need a responsible adult  at least 59 years of age to accompany you and drive you home.   This person must remain in the waiting room during your procedure.  Wear loose fitting clothing that is easily removed.  Leave jewelry and other valuables at home.  However, you may wish to bring a book to read or  an iPod/MP3 player to listen to music as you wait for your procedure to start.  Remove all body piercing jewelry and leave at home.  Total time from sign-in until discharge is approximately 2-3 hours.  You should go home directly after your procedure and rest.  You can resume normal activities the  day after your procedure.  The day of your procedure you should not:   Drive   Make legal decisions   Operate machinery   Drink alcohol   Return to work  You will receive specific instructions about eating, activities and medications before you leave.    The above instructions have been reviewed and explained to me by   _______________________    I fully understand and  can verbalize these instructions _____________________________ Date _________

## 2010-12-29 NOTE — Assessment & Plan Note (Signed)
Summary: Acute Only: Shoulder pain   Vital Signs:  Patient profile:   60 year old male Weight:      259.13 pounds Pulse rate:   78 / minute Pulse rhythm:   regular BP sitting:   124 / 82  (left arm) Cuff size:   large  Vitals Entered By: Army Fossa CMA (May 26, 2010 2:46 PM) CC: Pt here for should pain Comments had pacemaker put in shoulder has been sore since. No energy   History of Present Illness: complained of pain in the left shoulder since May 15 after they put a pacemaker in the left side of his chest Pain is worse with certain movements and goes from the a.c. joint after the neck, no neck pain per se. Has used Tylenol occasionally  Also complaining of lack of energy, this is not new, this started in April after he was diagnosed with bronchitis. If anything his energy is slightly better lately  Review of systems Denies chest pain or palpitations Shortness of breath at baseline  Hospital chart reviewed,Labs from May 2011: TSH 2.9, hemoglobin A1c 6.3, BMP essentially normal, hemoglobin 11.3  Allergies: 1)  * Altace  Past History:  Past Medical History: Reviewed history from 05/01/2010 and no changes required. 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 PTX traumatic age 84 months  multiple fractures Vasectomy colonoscopy neg 2002 ICD 03/2010; Sustained Ventricular Tachycardia with Syncope; non ischemic Cardiomyopathy with EF 15 %; mild MR, Dr Ladona Ridgel  Social History: Reviewed history from 05/01/2010 and no changes required. Former Smoker.  started at age 40.  2 1/2 ppd.  quit 1971. pt is married and lives with spouse. pt has children. 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  Review of Systems      See HPI  Physical  Exam  General:  alert, well-developed, and well-nourished.   Neck:  neck full range of motion No supra-clavicular mass or lymph nodes Chest Wall:  area of the pacemaker is free of redness, discharge, tenderness, crepitus Lungs:  normal respiratory effort, no intercostal retractions, and no accessory muscle use.  decreased breath sounds Msk:  not tender to palpation at the cervical or thoracic spine Extremities:  right shoulder normal Left shoulder: No deformity, range of motion normal, slightly tender at the a.c. joint. Neurologic:  strength symmetric throughout Reflexes symmetric   Impression & Recommendations:  Problem # 1:  SHOULDER PAIN (ICD-719.41)  shoulder pain since May It seems to be a mechanical issue, he is tender at the a.c. joint Continue Tylenol ortho referral His updated medication list for this problem includes:    Aspirin 325 Mg Tabs (Aspirin) .Marland Kitchen... 1 by mouth once daily  Orders: Orthopedic Referral (Ortho)  Problem # 2:  FATIGUE (ICD-780.79)  lack of energy since April Hospital chart is reviewed, his hemoglobin was 11.3 in May 2011 Previous hemoglobin in 2008 was normal Plan: CBC, iron, B12  Orders: Venipuncture (60454) TLB-IBC Pnl (Iron/FE;Transferrin) (83550-IBC) TLB-B12 + Folate Pnl (09811_91478-G95/AOZ) TLB-CBC Platelet - w/Differential (85025-CBCD)  Problem # 3:  advised to keep f/u w/  cards and his PCP as planned   Complete Medication List: 1)  Metformin Hcl 1000 Mg Tabs (Metformin hcl) .Marland Kitchen.. 1 by mouth two times a day 2)  Glimepiride 4 Mg Tabs (Glimepiride) .... Take 1 tablet by mouth once a  day 3)  Cozaar 25 Mg Tabs (Losartan potassium) .Marland Kitchen.. 1 by mouth once daily 4)  Simvastatin 40 Mg Tabs (Simvastatin) .Marland Kitchen.. 1 qhs 5)  Levothroid 50 Mcg Tabs (levothyroxine Sodium)  .Marland Kitchen.. 1 once daily 6)  Vit E  .... Prn 7)  Fish Oil  .... Prn 8)  Aspirin 325 Mg Tabs (Aspirin) .Marland Kitchen.. 1 by mouth once daily 9)  Multivitamin  10)  Niacin Cr 500 Mg Cr-caps  (Niacin) .Marland Kitchen.. 1 by mouth once daily 11)  Januvia 100 Mg Tabs (Sitagliptin phosphate) .Marland Kitchen.. 1 once daily 12)  Wavesense Ultra-thin Lancets Misc (Lancets) .... 250.00 check bloodsugar once daily 13)  Wavesense Presto W/device Kit (Blood glucose monitoring suppl) .... 250.00 check bs one time daily 14)  Wavesense Presto Test Strp (Glucose blood) .... 250.00, check bs once daily 15)  Amiodarone Hcl 200 Mg Tabs (Amiodarone hcl) .Marland Kitchen.. 1 by mouth every 12 hours 16)  Carvedilol 6.25 Mg Tabs (Carvedilol) .Marland Kitchen.. 1 by mouth two times a day with meals 17)  Furosemide 40 Mg Tabs (Furosemide) .Marland Kitchen.. 1 by mouth once daily 18)  Potassium Chloride Otc  .Marland Kitchen.. 1 by mouth once daily

## 2010-12-29 NOTE — Miscellaneous (Signed)
Summary: Orders Update  Clinical Lists Changes  Orders: Added new Test order of TLB-H Pylori Screen Gastric Biopsy (83013-CLOTEST) - Signed 

## 2010-12-29 NOTE — Procedures (Signed)
Summary: Soil scientist   Imported By: Sherian Rein 07/15/2010 11:11:53  _____________________________________________________________________  External Attachment:    Type:   Image     Comment:   External Document

## 2010-12-29 NOTE — Letter (Signed)
Summary: Device-Delinquent Phone Journalist, newspaper, Main Office  [address]   [place], [place] [address]   Phone: [phone]  Fax: [phone]     October 26, 2010 MRN: [id number]   [facility] [address] [place], [place]  [address]   Dear Mike Jimenez,  According to our records, you were scheduled for a device phone transmission on  10-22-2010.     We did not receive any results from this check.  If you transmitted on your scheduled day, please call us to help troubleshoot your system.  If you forgot to send your transmission, please send one upon receipt of this letter.  Thank you,   Architectural technologist [facility]

## 2010-12-29 NOTE — Progress Notes (Signed)
Summary: rx-glucometer  Phone Note Call from Patient Call back at [facility] Phone [phone]   Caller: Patient Summary of Call: Pt left VM that he would like a rx for a new glucometer sent in to [facility] [address]...............Marland KitchenFelecia Deloach CMA  Apr 17, 2010 12:05 PM     New/Updated Medications: [facility] BASIC SYSTEM W/DEVICE KIT (BLOOD GLUCOSE MONITORING SUPPL) Check blood sugar once daily ONETOUCH ULTRA TEST  STRP (GLUCOSE BLOOD) 250.00 Check bloodsugar daily ONETOUCH LANCETS  MISC (LANCETS) 250.00 Check Bloodsugar once daily Prescriptions: ONETOUCH LANCETS  MISC (LANCETS) 250.00 Check Bloodsugar once daily  #100 x 3   Entered by:   Shonna Chock   Authorized by:   Marga Melnick MD   Signed by:   Shonna Chock on 04/17/2010   Method used:   Electronically to        [facility] [address] (retail)       [address]       [place], [place]  [address]       Ph: [phone]       Fax: [phone]   RxID:   [id number] Mike Jimenez TEST  STRP (GLUCOSE BLOOD) 250.00 Check bloodsugar daily  #100 x 3   Entered by:   Shonna Chock   Authorized by:   Marga Melnick MD   Signed by:   Shonna Chock on 04/17/2010   Method used:   Electronically to        [facility] [address] (retail)       [address]       [place], [place]  [address]       Ph: [phone]       Fax: [phone]   RxID:   [id number] [facility] BASIC SYSTEM W/DEVICE KIT (BLOOD GLUCOSE MONITORING SUPPL) Check blood sugar once daily  #100 x 0   Entered by:   Shonna Chock   Authorized by:   Marga Melnick MD   Signed by:   Shonna Chock on 04/17/2010   Method used:   Electronically to        [facility] [address] [facility] (retail)       [address]       [place], [place]  [address]       Ph: [phone]       Fax: [phone]   RxID:   [id number]   Appended Document: rx-glucometer  Pharmacy called to say the Onetouch  kit is too expensive and they already contacted the patient and he is ok changing to the [facility] Rutter.  Prescriptions: WAVESENSE ULTRA-THIN LANCETS  MISC (LANCETS) 250.00 Check Bloodsugar once daily  #50 x 5   Entered by:   Shonna Chock   Authorized by:   Marga Melnick MD   Signed by:   Shonna Chock on 04/17/2010   Method used:   Electronically to        [facility] [address] [facility] (retail)       [address]       [place], [place]  [address]       Ph: [phone]       Fax: [phone]   RxID:   [id number] WAVESENSE PRESTO TEST  STRP (GLUCOSE BLOOD) 250.00, Check BS once daily  #50 x 5   Entered by:   Shonna Chock   Authorized by:  Marga Melnick MD   Signed by:   Shonna Chock on 04/17/2010   Method used:   Electronically to        [facility] (retail)       [address]       [place], [place]  [address]       Ph: [phone]       Fax: [phone]   RxID:   [id number] WAVESENSE PRESTO W/DEVICE KIT (BLOOD GLUCOSE MONITORING SUPPL) 250.00 Check BS one time daily  #1 x 0   Entered by:   Shonna Chock   Authorized by:   Marga Melnick MD   Signed by:   Shonna Chock on 04/17/2010   Method used:   Electronically to        [facility] [address] (retail)       [address]       [place], [place]  [address]       Ph: [phone]       Fax: [phone]   RxID:   [id number]

## 2010-12-29 NOTE — Miscellaneous (Signed)
Summary: Orders Update pft charges  Clinical Lists Changes  Orders: Added new Service order of Carbon Monoxide diffusing w/capacity (94720) - Signed Added new Service order of Lung Volumes (94240) - Signed Added new Service order of Spirometry (Pre & Post) (94060) - Signed 

## 2010-12-29 NOTE — Assessment & Plan Note (Signed)
Summary: pc2/jml   Visit Type:  Follow-up Referring Provider:  Marga Melnick, MD Primary Provider:  Marga Melnick MD   History of Present Illness: [name] returns today for followup.  He is a pleasant 60 yo man with a h/o VT, ICM, LBBB and is s/p ICD.   The patient has done well since his device implant.  He denies c/p, sob, or peripheral edema.  He has seen Dr. Shelle Iron and tried on an inhaler but he has stopped taking it siting that it has made him feel worse.  No ICD therapies.  Current Medications (verified): 1)  Metformin Hcl 1000 Mg Tabs (Metformin Hcl) .Marland Kitchen.. 1 By Mouth Two Times A Day 2)  Glimepiride 2 Mg Tabs (Glimepiride) .Marland Kitchen.. 1 Once Daily 3)  Cozaar 25 Mg Tabs (Losartan Potassium) .Marland Kitchen.. 1 By Mouth Once Daily 4)  Simvastatin 40 Mg  Tabs (Simvastatin) .Marland Kitchen.. 1 Qhs 5)  Levothroid 50 Mcg  Tabs (Levothyroxine Sodium) .Marland Kitchen.. 1 Once Daily 6)  Vit E .... Prn 7)  Fish Oil .... Prn 8)  Aspirin 325 Mg Tabs (Aspirin) .Marland Kitchen.. 1 By Mouth Once Daily 9)  Multivitamin 10)  Niacin Cr 500 Mg Cr-Caps (Niacin) .Marland Kitchen.. 1 By Mouth Once Daily 11)  Januvia 100 Mg Tabs (Sitagliptin Phosphate) .Marland Kitchen.. 1 Once Daily 12)  Wavesense Ultra-Thin Lancets  Misc (Lancets) .... 250.00 Check Bloodsugar Once Daily 13)  Wavesense Presto W/device Kit (Blood Glucose Monitoring Suppl) .... 250.00 Check Bs One Time Daily 14)  Wavesense Presto Test  Strp (Glucose Blood) .... 250.00, Check Bs Once Daily 15)  Amiodarone Hcl 200 Mg Tabs (Amiodarone Hcl) .Marland Kitchen.. 1 By Mouth Every 12 Hours 16)  Carvedilol 6.25 Mg Tabs (Carvedilol) .Marland Kitchen.. 1 By Mouth Two Times A Day With Meals 17)  Furosemide 40 Mg Tabs (Furosemide) .Marland Kitchen.. 1 By Mouth Once Daily 18)  Potassium Chloride Otc .Marland Kitchen.. 1 By Mouth Once Daily 19)  Ferrous Sulfate 325 (65 Fe) Mg Tabs (Ferrous Sulfate) .Marland Kitchen.. 1 By Mouth Two Times A Day 20)  Moviprep 100 Gm  Solr (Peg-Kcl-Nacl-Nasulf-Na Asc-C) .... As Per Prep Instructions.  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    Vital Signs:  Patient profile:   60 year old male Height:      75 inches Weight:      269 pounds BMI:     33.74 Pulse rate:   68 / minute BP sitting:   106 / 70  (left arm)  Vitals Entered By: Laurance Flatten CMA (July 21, 2010 1:44 PM)  Physical Exam  General:  Well developed, well nourished, in no acute distress.  HEENT: normal Neck: supple. No JVD. Carotids 2+ bilaterally no bruits Cor: RRR no rubs, gallops or murmur Lungs: CTA. Well healed ICD incision. Ab: soft, nontender. nondistended. No HSM. Good bowel sounds Ext: warm. no cyanosis, clubbing or edema Neuro: alert and oriented. Grossly nonfocal. affect pleasant     ICD Specifications Following MD:  Lewayne Bunting, MD     ICD Vendor:  [employer]     ICD  Model Number:  [id number]     ICD Serial Number:  [id number] ICD DOI:  04/13/2010     ICD Implanting MD:  Lewayne Bunting, MD  Lead 1:    Location: RA     DOI: 04/13/2010     Model #: [id number]     Serial #: [id number]     Status: active Lead 2:    Location: RV     DOI: 04/13/2010     Model #: [id number]     Serial #: [id number]     Status: active Lead 3:    Location: LV     DOI: 04/13/2010     Model #: [id number]     Serial #: [id number]     Status: active  Indications::  CM   ICD Follow Up Battery Voltage:  3.20 V     Charge Time:  8.6 seconds     Underlying rhythm:  SR ICD Dependent:  No       ICD Device Measurements Atrium:  Amplitude: 2.9  mV, Impedance: 437 ohms, Threshold: 1.00 V at 0.40 msec Right Ventricle:  Amplitude: 12.3 mV, Impedance: 437 ohms, Threshold: 1.00 V at 0.40 msec Left Ventricle:  Impedance: 779 ohms, Threshold: 0.50 V at 0.40 msec Configuration: LV TIP TO LV RING Shock Impedance: 44/57 ohms   Episodes MS Episodes:  0     Shock:  0     ATP:  0     Nonsustained:  0     Atrial Therapies:  0 Atrial Pacing:  0.3%     Ventricular Pacing:  99.8%  Brady Parameters Mode DDD     Lower Rate Limit:  50     Upper Rate Limit 130 PAV 140     Sensed AV Delay:  110  Tachy Zones VF:  200     VT:  240 FVT VIA VF     VT1:  171     Next Remote Date:  10/22/2010     Next Cardiology Appt Due:  06/30/2011 Tech Comments:  NORMAL DEVICE FUNCTION.  CHANGED RA OUTPUT FROM 3.50 TO 2.00 AND RV OUTPUT 2.50 V.  TURNED ON 1:1 SVT AND DECREASED MAX LEAD IMPEDANCE FOR LIA.  ENROLL IN [place].  CARELINK CHECK 10-22-10. ROV IN 12 MTHS W/GT. Vella Kohler  July 21, 2010 2:14 PM MD Comments:  Agree with above.  Impression & Recommendations:  Problem # 1:  VENTRICULAR TACHYCARDIA (ICD-427.1) His symptoms appear to be stable. He will reduce his amiodarone to 300 mg daily. His updated medication list for this problem includes:    Aspirin 325 Mg Tabs (Aspirin) .Marland Kitchen... 1 by mouth once daily    Amiodarone Hcl 200 Mg Tabs (Amiodarone hcl) .Marland Kitchen... 1 by mouth in am and 1/2 tablet in then pm    Carvedilol 6.25 Mg Tabs (Carvedilol) .Marland Kitchen... 1 by mouth two times a day with meals  Problem # 2:  AUTOMATIC IMPLANTABLE CARDIAC DEFIBRILLATOR SITU (ICD-V45.02) His device is working normally.  Will recheck in several months.  Problem # 3:  HYPERTENSION (ICD-401.9) His blood pressure has been well controlled.  A low sodium diet  is requested. His updated medication list for this problem includes:    Cozaar 25 Mg Tabs (Losartan potassium) .Marland Kitchen... 1 by mouth once daily    Aspirin 325 Mg Tabs (Aspirin) .Marland Kitchen... 1 by mouth once daily    Carvedilol 6.25 Mg Tabs  (Carvedilol) .Marland Kitchen... 1 by mouth two times a day with meals    Furosemide 40 Mg  Tabs (Furosemide) .Marland Kitchen... 1 by mouth once daily  Patient Instructions: 1)  Your physician recommends that you schedule a follow-up appointment in: 9 months with Dr Ladona Ridgel 2)  Your physician has recommended you make the following change in your medication: decrease Amiodarone to one tablet in the morning and 1/2 tablet in the pm

## 2010-12-29 NOTE — Progress Notes (Signed)
Summary: cough when lying down  Phone Note Call from Patient Call back at [facility] Phone [phone]   Caller: Patient Summary of Call: pt says he cannot lie down to sleep , start coughing violently when lying down, having to sleep sitting up x 1-2 weeks. --Do fairly well when standing up but when in prone position,start to cough. pt say have 1 dose left of cough med--Use [facility] [address] Initial call taken by: Kandice Hams,  Apr 09, 2010 4:16 PM  Follow-up for Phone Call        Phenergan with  codeine called into [facility] [address]. I saw him 4/20 but CXray not done until 5/11(ATX bilaterally). He has had Avelox & is on Albuterol 1-2 puffs every 4 hrs as needed & Symbicort 2 puffs two times a day. Steroids orally problamatic due to DM. Recommend Pulmonary Consult; to ER if symptoms progressive Follow-up by: Marga Melnick MD,  Apr 09, 2010 5:53 PM  New Problems: ABNORMAL CHEST XRAY (ICD-793.1)   New Problems: ABNORMAL CHEST XRAY (ICD-793.1)  Appended Document: cough when lying down Patient aware of all of Dr.Damarcus Reggio's recommendation and ok with referral

## 2010-12-29 NOTE — Progress Notes (Signed)
Summary: anemia, GI referral  Phone Note Outgoing Call   Summary of Call: advised patient He has iron deficiency anemia Start FeSO4 325 mg one p.o. b.i.d. Please arrange a GI referral ( within 2 or 3 weeks please) Call if fatigue gets worse or he is short of breath Kaelynn Igo E. Jarmar Rousseau MD  May 27, 2010 3:47 PM   Follow-up for Phone Call        Spoke with pt he is aware, meds called into the pharmacy. Referral put in.   New Problems: ANEMIA, IRON DEFICIENCY (ICD-280.9)   New Problems: ANEMIA, IRON DEFICIENCY (ICD-280.9) New/Updated Medications: FERROUS SULFATE 325 (65 FE) MG TABS (FERROUS SULFATE) 1 by mouth two times a day Prescriptions: FERROUS SULFATE 325 (65 FE) MG TABS (FERROUS SULFATE) 1 by mouth two times a day  #60 x 2   Entered by:   Army Fossa CMA   Authorized by:   Nolon Rod. Saurav Crumble MD   Signed by:   Army Fossa CMA on 05/27/2010   Method used:   Electronically to        [facility] [address] (retail)       [address]       [place], [place]  [address]       Ph: [phone]       Fax: [phone]   RxID:   [id number]

## 2010-12-29 NOTE — Assessment & Plan Note (Signed)
Summary: hosp f/u mc pacemaker/cbs   Vital Signs:  Patient profile:   60 year old male Weight:      263 pounds Pulse rate:   72 / minute Resp:     17 per minute BP sitting:   110 / 70  (left arm) Cuff size:   large  Vitals Entered By: Shonna Chock (Apr 23, 2010 3:50 PM) CC: Hospital Follow-Up Comments REVIEWED MED LIST, PATIENT AGREED DOSE AND INSTRUCTION CORRECT   **Patient to call and clarify dose of Amaryl- patient not sure at the time of OV**   CC:  Hospital Follow-Up.  History of Present Illness: His asthma is stable on Symbicort 2 puffs two times a day; he is gargling & spitting after use. PMH of severe asthma as child with need for hospitalization with "oxygen tent". Discharge Summary reviewed.He had been off Ventolin for > 1 week before the dysrrhythmia so excess Beta agonist stimulation does not appear to be etiology of rhythm dysfunction. The risk of dysrrhythmias with excess albuterol was explained to him & his wife.Before the event he was having no significant bronchospasm; he felt his asthma was adequately controlled. Dr Ladona Ridgel "thought a virus had weakened the heart muscle". He has never taken  flu shot on annual basis. He is waiting for a glucometer  meter to restart DM monitoring. As an IP his FBS 130-140. Role of HFCS in aggravating DM discussed with him & his wife. She is determined to optimize his diet @ home.  Allergies: 1)  * Altace  Past History:  Past Surgical History: PTX traumatic age 67 months  multiple fractures Vasectomy asthma colonoscopy neg 2002 ICD 03/2010; Sustained Ventricular Tachycardia with Syncope; non ischemic Cardiomyopathy with EF 15 %; mild MR, Dr taylor  Review of Systems General:  Denies chills, fever, and sweats. ENT:  Denies nasal congestion; No frontal headache , facial pain or purulence. CV:  Denies difficulty breathing at night, difficulty breathing while lying down, and swelling of feet. Resp:  Denies chest pain with  inspiration, coughing up blood, shortness of breath, and wheezing; Scant sputum.  Physical Exam  General:  in no acute distress; alert,appropriate and cooperative throughout examination Mouth:  Hoarseness which is chronic finding Lungs:  Normal respiratory effort, chest expands symmetrically. Lungs are clear to auscultation, no crackles or wheezes. Decreased BS Heart:  Normal rate and regular rhythm. S1 and S2 normal without gallop, murmur, click, rub. Extremities:  No clubbing, cyanosis, edema. Neurologic:  alert & oriented X3.   Skin:  Intact without suspicious lesions or rashes; warm & dry Cervical Nodes:  No lymphadenopathy noted Axillary Nodes:  No palpable lymphadenopathy Psych:  memory intact for recent and remote, normally interactive, and good eye contact. Motivated to address DM control     Impression & Recommendations:  Problem # 1:  ASTHMA (ICD-493.90) Stable The following medications were removed from the medication list:    Qvar 80 Mcg/act Aers (Beclomethasone dipropionate) .Marland Kitchen... 2 puffs eveery 12 hrs ; gargle & spit after use His updated medication list for this problem includes:    Ventolin Hfa 108 (90 Base) Mcg/act Aers (Albuterol sulfate) .Marland Kitchen... 1-2 puffs every 4 hrs as needed cough ,sob    Symbicort 160-4.5 Mcg/act Aero (Budesonide-formoterol fumarate) .Marland Kitchen... 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 1 once daily His  updated medication list for this problem includes:    Aspirin 325 Mg Tabs (Aspirin) .Marland Kitchen... 1 by mouth once daily    Amiodarone Hcl 200 Mg Tabs (Amiodarone hcl) .Marland Kitchen... 1 by mouth every 12 hours    Carvedilol 6.25 Mg Tabs (Carvedilol) .Marland Kitchen... 1 by mouth two times a day with meals  Problem # 3:  DIABETES MELLITUS, CONTROLLED (ICD-250.00)  His updated medication list for this  problem includes:    Metformin Hcl 1000 Mg Tabs (Metformin hcl) .Marland Kitchen... 1 by mouth two times a day    Glimepiride 2 Mg Tabs (Glimepiride) .Marland Kitchen... 1 by mouth two times a day    Cozaar 25 Mg Tabs (Losartan potassium) .Marland Kitchen... 1 by mouth once daily    Aspirin 325 Mg Tabs (Aspirin) .Marland Kitchen... 1 by mouth once daily    Januvia 100 Mg Tabs (Sitagliptin phosphate) .Marland Kitchen... 1 once daily  Complete Medication List: 1)  Viagra 100 Mg Tabs (Sildenafil citrate) .... Prn 2)  Metformin Hcl 1000 Mg Tabs (Metformin hcl) .Marland Kitchen.. 1 by mouth two times a day 3)  Glimepiride 2 Mg Tabs (Glimepiride) .Marland Kitchen.. 1 by mouth two times a day 4)  Cozaar 25 Mg Tabs (Losartan potassium) .Marland Kitchen.. 1 by mouth once daily 5)  Simvastatin 40 Mg Tabs (Simvastatin) .Marland Kitchen.. 1 qhs 6)  Levothroid 50 Mcg Tabs (levothyroxine Sodium)  .Marland Kitchen.. 1 once daily 7)  Vit E  .... Prn 8)  Fish Oil  .... Prn 9)  Aspirin 325 Mg Tabs (Aspirin) .Marland Kitchen.. 1 by mouth once daily 10)  Multivitamin  11)  Niacin Cr 500 Mg Cr-caps (Niacin) .Marland Kitchen.. 1 by mouth once daily 12)  Januvia 100 Mg Tabs (Sitagliptin phosphate) .Marland Kitchen.. 1 once daily 13)  Ventolin Hfa 108 (90 Base) Mcg/act Aers (Albuterol sulfate) .Marland Kitchen.. 1-2 puffs every 4 hrs as needed cough ,sob 14)  Symbicort 160-4.5 Mcg/act Aero (Budesonide-formoterol fumarate) .Marland Kitchen.. 1-2 puffs every 12 hrs as needed 15)  Promethazine Vc/codeine 6.25-5-10 Mg/52ml Syrp (Phenyleph-promethazine-cod) .Marland Kitchen.. 1 tsp every 6 hrs as needed for cough 16)  Wavesense Ultra-thin Lancets Misc (Lancets) .... 250.00 check bloodsugar once daily 17)  Wavesense Presto W/device Kit (Blood glucose monitoring suppl) .... 250.00 check bs one time daily 18)  Wavesense Presto Test Strp (Glucose blood) .... 250.00, check bs once daily 19)  Amiodarone Hcl 200 Mg Tabs (Amiodarone hcl) .Marland Kitchen.. 1 by mouth every 12 hours 20)  Carvedilol 6.25 Mg Tabs (Carvedilol) .Marland Kitchen.. 1 by mouth two times a day with meals 21)  Furosemide 40 Mg Tabs (Furosemide) .Marland Kitchen.. 1 by mouth once daily 22)  Mytussin Ac 100-10  Mg/80ml Syrp (Guaifenesin-codeine) .Marland Kitchen.. 15 milliters every 6 hours as needed 23)  Lovaza 1 Gm Caps (Omega-3-acid ethyl esters) .Marland Kitchen.. 1 by mouth three times a day 24)  Potassium Chloride Otc  .Marland Kitchen.. 1 by mouth once daily  Other Orders: Pneumococcal Vaccine (04540) Admin 1st Vaccine (98119)  Patient Instructions: 1)  Check your blood sugars regularly. If your readings are usually above : 150 or below 90 you should contact our office. 2)  See your eye doctor yearly to check for diabetic eye damage. 3)  Check your feet each night for sore areas, calluses or signs of infection. Use Albuterol every 4 hrs as needed only ; please be seen if needed more than 2-3 X/week. Wean Symbicort as discussed. Prescriptions: SIMVASTATIN 40 MG  TABS (SIMVASTATIN) 1 qhs  #90.0 Each x 1   Entered and Authorized by:   Marga Melnick MD   Signed by:   Chrissie Noa  Ashutosh Dieguez MD on 04/23/2010   Method used:   Faxed to ...       [facility] (retail)       [address]       [place], [place]  [address]       Ph: [phone]       Fax: [phone]   RxID:   [id number] JANUVIA 100 MG TABS (SITAGLIPTIN PHOSPHATE) 1 once daily  #30 x 5   Entered and Authorized by:   Marga Melnick MD   Signed by:   Marga Melnick MD on 04/23/2010   Method used:   Faxed to ...       [facility] (retail)       [address]       [place], [place]  [address]       Ph: [phone]       Fax: [phone]   RxID:   [id number] LEVOTHROID 50 MCG  TABS (LEVOTHYROXINE SODIUM) 1 once daily  #90 x 1   Entered and Authorized by:   Marga Melnick MD   Signed by:   Marga Melnick MD on 04/23/2010   Method used:   Faxed to ...       [facility] (retail)       [address]       [place], [place]  [address]       Ph: [phone]       Fax: [phone]   RxID:   [id number] METFORMIN HCL 1000 MG TABS (METFORMIN HCL) 1 by  mouth two times a day  #180 x 1   Entered and Authorized by:   Marga Melnick MD   Signed by:   Marga Melnick MD on 04/23/2010   Method used:   Faxed to ...       [facility] (retail)       [address]       [place], [place]  [address]       Ph: [phone]       Fax: [phone]   RxID:   [id number]    Immunizations Administered:  Pneumonia Vaccine:    Vaccine Type: Pneumovax    Site: left deltoid    Mfr: Merck    Dose: 0.5 ml    Route: IM    Given by: Shonna Chock    Exp. Date: 09/23/2011    Lot #: [id number]    Orders Added: 1)  Est. Patient Level III [74128] 2)  Pneumococcal Vaccine [90732] 3)  Admin 1st Vaccine [90471] 4)  Est. Patient Level IV [78676]

## 2010-12-29 NOTE — Progress Notes (Signed)
Summary: Triage / ? office visit for iron deficiency anemia  Phone Note From Other Clinic   Caller: Marisue Ivan @ [facility]  [phone] Call For: Dr. Marina Goodell Summary of Call: pt is sch'd for COL on 07-02-10 and he has iron def. anemia...wants to know if he should keep this appt. or change to OV? Initial call taken by: Karna Christmas,  May 27, 2010 4:38 PM  Follow-up for Phone Call        probably best if we see him in the office first. Thanks Follow-up by: Hilarie Fredrickson MD,  May 27, 2010 4:42 PM  Additional Follow-up for Phone Call Additional follow up Details #1::        Returned Liz's cal @ [facility] and informed her we have canceled pt.'s COL on 07-02-10 and sch'd OV on 07-14-10 @ 9:30 w/Dr. Marina Goodell. Per Marisue Ivan she is going to inform pt. of changes in appts. Additional Follow-up by: Karna Christmas,  May 28, 2010 9:46 AM

## 2010-12-29 NOTE — Miscellaneous (Signed)
Summary: Device preload  Clinical Lists Changes  Observations: Added new observation of ICD INDICATN: CM (04/14/2010 14:28) Added new observation of ICDLEADSTAT3: active (04/14/2010 14:28) Added new observation of ICDLEADSER3: [id number] (04/14/2010 14:28) Added new observation of ICDLEADMOD3: 1158T (04/14/2010 14:28) Added new observation of ICDLEADLOC3: LV (04/14/2010 14:28) Added new observation of ICDLEADSTAT2: active (04/14/2010 14:28) Added new observation of ICDLEADSER2: [id number] V (04/14/2010 14:28) Added new observation of ICDLEADMOD2: 2951  (04/14/2010 14:28) Added new observation of ICDLEADLOC2: RV  (04/14/2010 14:28) Added new observation of ICDLEADSTAT1: active  (04/14/2010 14:28) Added new observation of ICDLEADSER1: [id number]  (04/14/2010 14:28) Added new observation of ICDLEADMOD1: 5076  (04/14/2010 14:28) Added new observation of ICDLEADLOC1: RA  (04/14/2010 14:28) Added new observation of ICD IMP MD: Lewayne Bunting, MD  (04/14/2010 14:28) Added new observation of ICDLEADDOI3: 04/13/2010  (04/14/2010 14:28) Added new observation of ICDLEADDOI2: 04/13/2010  (04/14/2010 14:28) Added new observation of ICDLEADDOI1: 04/13/2010  (04/14/2010 14:28) Added new observation of ICD IMPL DTE: 04/13/2010  (04/14/2010 14:28) Added new observation of ICD SERL#: [id number] H  (04/14/2010 14:28) Added new observation of ICD MODL#: [id number]  (04/14/2010 14:28) Added new observation of ICDMANUFACTR: [employer]onic  (04/14/2010 14:28) Added new observation of ICD MD: Lewayne Bunting, MD  (04/14/2010 14:28)       ICD Specifications Following MD:  Lewayne Bunting, MD     ICD Vendor:  Medtronic     ICD Model Number:  [id number]     ICD Serial Number:  [id number] ICD DOI:  04/13/2010     ICD Implanting MD:  Lewayne Bunting, MD  Lead 1:    Location: RA     DOI: 04/13/2010     Model #: 1761     Serial #: [id number]     Status: active Lead 2:    Location: RV     DOI: 04/13/2010     Model #: 9485     Serial #:  [id number]     Status: active Lead 3:    Location: LV     DOI: 04/13/2010     Model #: 1158T     Serial #: [id number]     Status: active  Indications::  CM

## 2010-12-29 NOTE — Miscellaneous (Signed)
Summary: Omeprazole Rx  Clinical Lists Changes  Medications: Added new medication of OMEPRAZOLE 20 MG  CPDR (OMEPRAZOLE) 1 each day 30 minutes before meal - Signed Rx of OMEPRAZOLE 20 MG  CPDR (OMEPRAZOLE) 1 each day 30 minutes before meal;  #30 x 11;  Signed;  Entered by: Durwin Glaze RN;  Authorized by: Hilarie Fredrickson MD;  Method used: Electronically to [facility] [address], [place], [place], [place]  [address], Ph: [phone], Fax: [phone]    Prescriptions: OMEPRAZOLE 20 MG  CPDR (OMEPRAZOLE) 1 each day 30 minutes before meal  #30 x 11   Entered by:   Durwin Glaze RN   Authorized by:   Hilarie Fredrickson MD   Signed by:   Durwin Glaze RN on 08/12/2010   Method used:   Electronically to        [facility] (retail)       [address]       [place], [place]  [address]       Ph: [phone]       Fax: [phone]   RxID:   [id number]

## 2010-12-29 NOTE — Letter (Signed)
Summary: [facility]  [facility]   Imported By: Lanelle Bal 06/08/2010 09:02:43  _____________________________________________________________________  External Attachment:    Type:   Image     Comment:   External Document

## 2010-12-31 NOTE — Assessment & Plan Note (Signed)
Summary: rto 3 months/cbs   Vital Signs:  Patient profile:   60 year old male Weight:      280 pounds BMI:     35.12 Temp:     98.7 degrees F oral Pulse rate:   60 / minute Resp:     15 per minute BP sitting:   130 / 88  (left arm) Cuff size:   large  Vitals Entered By: Shonna Chock CMA (November 18, 2010 8:06 AM) CC: 3 month follow-up (copy of labs given) , Type 2 diabetes mellitus follow-up   Primary Care Provider:  Marga Melnick MD  CC:  3 month follow-up (copy of labs given)  and Type 2 diabetes mellitus follow-up.  History of Present Illness: Type 2 Diabetes Mellitus Follow-Up      This is a 60 year old man who presents for Type 2 diabetes mellitus follow-up.  The patient reports  rare self managed hypoglycemia and weight gain of 5-10# , but denies polyuria, polydipsia, blurred vision, and numbness of extremities.  The patient denies the following symptoms: neuropathic pain, chest pain, vomiting, vision loss, and foot ulcer.  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  The patient has been measuring capillary blood glucose before breakfast, 110-120. A1c 6.2%(132 average sugar, risk 24%) on Metformin & Januvia; it was 5.5% (111/15% risk ) in 07/2010. LDL was 103 in 09/11.    Current Medications (verified): 1)  Metformin Hcl 1000 Mg Tabs (Metformin Hcl) .Marland Kitchen.. 1 By Mouth Two Times A Day 2)  Cozaar 25 Mg Tabs (Losartan Potassium) .Marland Kitchen.. 1 By Mouth Once Daily 3)  Simvastatin 40 Mg  Tabs (Simvastatin) .Marland Kitchen.. 1 Qhs 4)  Levothroid 50 Mcg  Tabs (Levothyroxine Sodium) .Marland Kitchen.. 1 Once Daily 5)  Vit E .... Prn 6)  Fish Oil .... Prn 7)  Aspirin 325 Mg Tabs (Aspirin) .Marland Kitchen.. 1 By Mouth Once Daily 8)  Multivitamin 9)  Niacin Cr 500 Mg Cr-Caps (Niacin) .Marland Kitchen.. 1 By Mouth Once Daily 10)  Januvia 100 Mg Tabs (Sitagliptin Phosphate) .Marland Kitchen.. 1 Once Daily 11)  Wavesense Ultra-Thin Lancets  Misc (Lancets) .... 250.00 Check Bloodsugar Once Daily 12)  Katheren Puller  Presto W/device Kit (Blood Glucose Monitoring Suppl) .... 250.00 Check Bs One Time Daily 13)  Wavesense Presto Test  Strp (Glucose Blood) .... 250.00, Check Bs Once Daily 14)  Amiodarone Hcl 200 Mg Tabs (Amiodarone Hcl) .Marland Kitchen.. 1 By Mouth in Am and 1/2 Tablet in Then Pm 15)  Carvedilol 6.25 Mg Tabs (Carvedilol) .Marland Kitchen.. 1 By Mouth Two Times A Day With Meals 16)  Furosemide 40 Mg Tabs (Furosemide) .Marland Kitchen.. 1 By Mouth Once Daily 17)  Potassium Chloride Otc .Marland Kitchen.. 1 By Mouth Once Daily 18)  Ferrous Sulfate 325 (65 Fe) Mg Tabs (Ferrous Sulfate) .Marland Kitchen.. 1 By Mouth Two Times A Day  Allergies: 1)  * Altace  Physical Exam  General:  alert,appropriate and cooperative throughout examination Lungs:  Normal respiratory effort, chest expands symmetrically. Lungs are clear to auscultation, no crackles or wheezes. Heart:  Normal rate and regular rhythm. S1 and S2 normal without gallop, murmur, click, rub.Soft S4 Pulses:  R and L carotid,radial,dorsalis pedis and posterior tibial pulses are full and equal bilaterally Extremities:  No clubbing, cyanosis, edema.Good nail health Neurologic:  alert & oriented X3 and sensation intact to light touch over feet.   Skin:  Intact without suspicious lesions or rashes Psych:  Focused & intelligent   Impression & Recommendations:  Problem #  1:  DIABETES MELLITUS, CONTROLLED (ICD-250.00)  The following medications were removed from the medication list:    Januvia 100 Mg Tabs (Sitagliptin phosphate) .Marland Kitchen... 1 once daily His updated medication list for this problem includes:    Metformin Hcl 1000 Mg Tabs (Metformin hcl) .Marland Kitchen... 1 by mouth two times a day    Cozaar 25 Mg Tabs (Losartan potassium) .Marland Kitchen... 1 by mouth once daily    Aspirin 325 Mg Tabs (Aspirin) .Marland Kitchen... 1 by mouth once daily  Complete Medication List: 1)  Metformin Hcl 1000 Mg Tabs (Metformin hcl) .Marland Kitchen.. 1 by mouth two times a day 2)  Cozaar 25 Mg Tabs (Losartan potassium) .Marland Kitchen.. 1 by mouth once daily 3)  Simvastatin 40 Mg  Tabs (Simvastatin) .Marland Kitchen.. 1 qhs 4)  Levothroid 50 Mcg Tabs (levothyroxine Sodium)  .Marland Kitchen.. 1 once daily 5)  Vit E  .... Prn 6)  Fish Oil  .... Prn 7)  Aspirin 325 Mg Tabs (Aspirin) .Marland Kitchen.. 1 by mouth once daily 8)  Multivitamin  9)  Niacin Cr 500 Mg Cr-caps (Niacin) .Marland Kitchen.. 1 by mouth once daily 10)  Wavesense Ultra-thin Lancets Misc (Lancets) .... 250.00 check bloodsugar once daily 11)  Wavesense Presto W/device Kit (Blood glucose monitoring suppl) .... 250.00 check bs one time daily 12)  Wavesense Presto Test Strp (Glucose blood) .... 250.00, check bs once daily 13)  Amiodarone Hcl 200 Mg Tabs (Amiodarone hcl) .Marland Kitchen.. 1 by mouth in am and 1/2 tablet in then pm 14)  Carvedilol 6.25 Mg Tabs (Carvedilol) .Marland Kitchen.. 1 by mouth two times a day with meals 15)  Furosemide 40 Mg Tabs (Furosemide) .Marland Kitchen.. 1 by mouth once daily 16)  Potassium Chloride Otc  .Marland Kitchen.. 1 by mouth once daily 17)  Ferrous Sulfate 325 (65 Fe) Mg Tabs (Ferrous sulfate) .Marland Kitchen.. 1 by mouth two times a day  Patient Instructions: 1)  It is important that you exercise regularly at least 20 minutes 5 times a week. If you develop chest pain, have severe difficulty breathing, or feel very tired , stop exercising immediately and seek medical attention. Avoid HFCS sugar totally if possible. 2)  Check your blood sugars regularly. If your readings are usually above : 150 or below 90 you should contact our office. 3)  See your eye doctor yearly to check for diabetic eye damage. 4)  Check your feet each night for sore areas, calluses or signs of infection. 5)  Please schedule a follow-up appointment in 4 months. 6)  Lipid Panel prior to visit, ICD-9:272.4 7)  HbgA1C prior to visit, ICD-9:250.00 8)  Urine Microalbumin prior to visit, ICD-9:250.00 Prescriptions: METFORMIN HCL 1000 MG TABS (METFORMIN HCL) 1 by mouth two times a day  #180 x 1   Entered and Authorized by:   Marga Melnick MD   Signed by:   Marga Melnick MD on 11/18/2010   Method used:   Faxed to  ...       [facility] (retail)       [address]       [place], [place]  [address]       Ph: [phone]       Fax: [phone]   RxID:   [id number]    Orders Added: 1)  Est. Patient Level III [60630]

## 2010-12-31 NOTE — Cardiovascular Report (Signed)
Summary: Office Visit Remote   Office Visit Remote   Imported By: Roderic Ovens 12/11/2010 12:07:34  _____________________________________________________________________  External Attachment:    Type:   Image     Comment:   External Document

## 2010-12-31 NOTE — Progress Notes (Signed)
Summary: Refill Request  Phone Note Refill Request Call back at [phone] Message from:  Pharmacy on December 01, 2010 11:01 AM  Refills Requested: Medication #1:  CARVEDILOL 6.25 MG TABS 1 by mouth two times a day WITH MEALS   Dosage confirmed as above?Dosage Confirmed   Supply Requested: 60   Last Refilled: 11/06/2010  Medication #2:  FUROSEMIDE 40 MG TABS 1 by mouth once daily   Dosage confirmed as above?Dosage Confirmed   Supply Requested: 30   Last Refilled: 11/06/2010 [facility]  Next Appointment Scheduled: 4.17.12 Initial call taken by: Harold Barban,  December 01, 2010 11:02 AM    Prescriptions: CARVEDILOL 6.25 MG TABS (CARVEDILOL) 1 by mouth two times a day WITH MEALS  #60 x 3   Entered by:   Shonna Chock CMA   Authorized by:   Marga Melnick MD   Signed by:   Shonna Chock CMA on 12/01/2010   Method used:   Electronically to        [facility] (retail)       [address]       [place], [place]  [address]       Ph: [phone]       Fax: [phone]   RxID:   [id number] FUROSEMIDE 40 MG TABS (FUROSEMIDE) 1 by mouth once daily  #30 x 3   Entered by:   Shonna Chock CMA   Authorized by:   Marga Melnick MD   Signed by:   Shonna Chock CMA on 12/01/2010   Method used:   Electronically to        [facility] (retail)       [address]       [place], [place]  [address]       Ph: [phone]       Fax: [phone]   RxID:   [id number]

## 2010-12-31 NOTE — Letter (Signed)
Summary: Remote Device Check  [facility], Main Office  [address]   [place], [place] [address]   Phone: [phone]  Fax: [phone]     December 08, 2010 MRN: [id number]   [facility] [address] [place], [place]  [address]   Dear Mr. Piper,   Your remote transmission was recieved and reviewed by your physician.  All diagnostics were within normal limits for you.  __X___Your next transmission is scheduled for:  02-04-11.  Please transmit at any time this day.  If you have a wireless device your transmission will be sent automatically.   Sincerely,  Vella Kohler

## 2011-01-06 NOTE — Assessment & Plan Note (Signed)
Summary: tb /cbs  Nurse Visit  CC: TB skin test./kb   Allergies: 1)  * Altace  Immunizations Administered:  PPD Skin Test:    Vaccine Type: PPD    Site: left forearm    Mfr: Sanofi Pasteur    Dose: 0.1 ml    Route: ID    Given by: Lucious Groves CMA    Exp. Date: 06/04/2012    Lot #: [id number]  PPD Results    Date of reading: 12/30/2010    Results: < 5mm    Interpretation: negative  Orders Added: 1)  TB Skin Test [86580] 2)  Admin 1st Vaccine [11914] Patient has not came yet for TB to be read. Left message on machine at home to call back to office. Lucious Groves CMA  December 30, 2010 2:07 PM

## 2011-01-14 ENCOUNTER — Telehealth (INDEPENDENT_AMBULATORY_CARE_PROVIDER_SITE_OTHER): Payer: Self-pay | Admitting: *Deleted

## 2011-01-20 NOTE — Progress Notes (Signed)
Summary: Refill  Phone Note Refill Request Call back at [phone] Message from:  Pharmacy on January 14, 2011 1:52 PM  Refills Requested: Medication #1:  SIMVASTATIN 40 MG  TABS 1 qhs   Dosage confirmed as above?Dosage Confirmed   Supply Requested: 1 month [facility]  Initial call taken by: Lavell Islam,  January 14, 2011 1:53 PM  Follow-up for Phone Call        Left message on voicemail informing pharmacy rx sent electronically yesterday, please fill if not received #30/6 refills Follow-up by: Shonna Chock CMA,  January 14, 2011 2:02 PM

## 2011-02-01 ENCOUNTER — Encounter: Payer: Self-pay | Admitting: Internal Medicine

## 2011-02-04 ENCOUNTER — Encounter (INDEPENDENT_AMBULATORY_CARE_PROVIDER_SITE_OTHER): Payer: Managed Care, Other (non HMO)

## 2011-02-04 ENCOUNTER — Encounter: Payer: Self-pay | Admitting: Internal Medicine

## 2011-02-04 DIAGNOSIS — I428 Other cardiomyopathies: Secondary | ICD-10-CM

## 2011-02-04 DIAGNOSIS — I509 Heart failure, unspecified: Secondary | ICD-10-CM

## 2011-02-11 ENCOUNTER — Encounter: Payer: Self-pay | Admitting: *Deleted

## 2011-02-11 LAB — GLUCOSE, CAPILLARY
Glucose-Capillary: 71 mg/dL (ref 70–99)
Glucose-Capillary: 93 mg/dL (ref 70–99)

## 2011-02-15 LAB — BASIC METABOLIC PANEL
BUN: 12 mg/dL (ref 6–23)
CO2: 25 mEq/L (ref 19–32)
Calcium: 8.5 mg/dL (ref 8.4–10.5)
Calcium: 8.7 mg/dL (ref 8.4–10.5)
Calcium: 9 mg/dL (ref 8.4–10.5)
Chloride: 103 mEq/L (ref 96–112)
Chloride: 103 mEq/L (ref 96–112)
Creatinine, Ser: 1 mg/dL (ref 0.4–1.5)
Creatinine, Ser: 1.14 mg/dL (ref 0.4–1.5)
GFR calc Af Amer: >60 mL/min (ref >60)
GFR calc Af Amer: >60 mL/min (ref >60)
GFR calc Af Amer: >60 mL/min (ref >60)
GFR calc non Af Amer: >60 mL/min (ref >60)
GFR calc non Af Amer: >60 mL/min (ref >60)
GFR calc non Af Amer: >60 mL/min (ref >60)
GFR calc non Af Amer: >60 mL/min (ref >60)
Glucose, Bld: 133 mg/dL — ABNORMAL HIGH (ref 70–99)
Glucose, Bld: 152 mg/dL — ABNORMAL HIGH (ref 70–99)
Glucose, Bld: 218 mg/dL — ABNORMAL HIGH (ref 70–99)
Glucose, Bld: 87 mg/dL (ref 70–99)
Potassium: 4.3 mEq/L (ref 3.5–5.1)
Potassium: 4.3 mEq/L (ref 3.5–5.1)
Sodium: 135 mEq/L (ref 135–145)
Sodium: 137 mEq/L (ref 135–145)
Sodium: 137 mEq/L (ref 135–145)
Sodium: 138 mEq/L (ref 135–145)

## 2011-02-15 LAB — CBC
HCT: 35.3 % — ABNORMAL LOW (ref 39.0–52.0)
Hemoglobin: 12.5 g/dL — ABNORMAL LOW (ref 13.0–17.0)
MCHC: 36 g/dL (ref 30.0–36.0)
MCV: 93.4 fL (ref 78.0–100.0)
Platelets: 248 10*3/uL (ref 150–400)
Platelets: 260 10*3/uL (ref 150–400)
RBC: 3.42 MIL/uL — ABNORMAL LOW (ref 4.22–5.81)
RBC: 3.82 MIL/uL — ABNORMAL LOW (ref 4.22–5.81)
RDW: 13.6 % (ref 11.5–15.5)
RDW: 14 % (ref 11.5–15.5)
WBC: 13.1 10*3/uL — ABNORMAL HIGH (ref 4.0–10.5)
WBC: 8.6 10*3/uL (ref 4.0–10.5)
WBC: 9.7 10*3/uL (ref 4.0–10.5)

## 2011-02-15 LAB — POCT CARDIAC MARKERS
CKMB, poc: 4.5 ng/mL (ref 1.0–8.0)
Troponin i, poc: <0.05 ng/mL (ref 0.00–0.09)

## 2011-02-15 LAB — DIFFERENTIAL
Basophils Absolute: 0 10*3/uL (ref 0.0–0.1)
Eosinophils Relative: 1 % (ref 0–5)
Lymphocytes Relative: 14 % (ref 12–46)
Lymphs Abs: 1.8 10*3/uL (ref 0.7–4.0)
Monocytes Absolute: 1.3 10*3/uL — ABNORMAL HIGH (ref 0.1–1.0)
Monocytes Relative: 10 % (ref 3–12)
Neutro Abs: 9.8 10*3/uL — ABNORMAL HIGH (ref 1.7–7.7)

## 2011-02-15 LAB — GLUCOSE, CAPILLARY
Glucose-Capillary: 113 mg/dL — ABNORMAL HIGH (ref 70–99)
Glucose-Capillary: 116 mg/dL — ABNORMAL HIGH (ref 70–99)
Glucose-Capillary: 121 mg/dL — ABNORMAL HIGH (ref 70–99)
Glucose-Capillary: 129 mg/dL — ABNORMAL HIGH (ref 70–99)
Glucose-Capillary: 153 mg/dL — ABNORMAL HIGH (ref 70–99)
Glucose-Capillary: 162 mg/dL — ABNORMAL HIGH (ref 70–99)
Glucose-Capillary: 166 mg/dL — ABNORMAL HIGH (ref 70–99)
Glucose-Capillary: 172 mg/dL — ABNORMAL HIGH (ref 70–99)
Glucose-Capillary: 175 mg/dL — ABNORMAL HIGH (ref 70–99)
Glucose-Capillary: 184 mg/dL — ABNORMAL HIGH (ref 70–99)

## 2011-02-15 LAB — BRAIN NATRIURETIC PEPTIDE: Pro B Natriuretic peptide (BNP): 1976 pg/mL — ABNORMAL HIGH (ref 0.0–100.0)

## 2011-02-15 LAB — LIPID PANEL
Triglycerides: 78 mg/dL (ref <150)
VLDL: 16 mg/dL (ref 0–40)

## 2011-02-15 LAB — MAGNESIUM: Magnesium: 1.9 mg/dL (ref 1.5–2.5)

## 2011-02-15 LAB — TSH: TSH: 2.953 u[IU]/mL (ref 0.350–4.500)

## 2011-02-15 LAB — HEMOGLOBIN A1C: Hgb A1c MFr Bld: 6.3 % — ABNORMAL HIGH (ref <5.7)

## 2011-02-15 LAB — MRSA PCR SCREENING: MRSA by PCR: NEGATIVE

## 2011-02-15 LAB — PROTIME-INR: INR: 1.15 (ref 0.00–1.49)

## 2011-02-16 NOTE — Letter (Signed)
Summary: Remote Device Check  [employer], Main Office  [address]   [place], [place] [address]   Phone: [phone]  Fax: [phone]     February 11, 2011 MRN: [id number]   [facility] [address] [place], [place]  [address]   Dear Mr. Chirico,   Your remote transmission was recieved and reviewed by your physician.  All diagnostics were within normal limits for you.  _____Your next transmission is scheduled for:                       .  Please transmit at any time this day.  If you have a wireless device your transmission will be sent automatically.  ___X___Your next office visit is scheduled for: June with Dr. Ladona Ridgel.                            . Please call our office to schedule an appointment.    Sincerely,  Altha Harm, LPN

## 2011-02-16 NOTE — Cardiovascular Report (Signed)
Summary: Office Visit   Office Visit   Imported By: Roderic Ovens 02/12/2011 14:57:22  _____________________________________________________________________  External Attachment:    Type:   Image     Comment:   External Document

## 2011-03-09 ENCOUNTER — Other Ambulatory Visit (INDEPENDENT_AMBULATORY_CARE_PROVIDER_SITE_OTHER): Payer: Managed Care, Other (non HMO)

## 2011-03-09 ENCOUNTER — Telehealth: Payer: Self-pay | Admitting: Internal Medicine

## 2011-03-09 DIAGNOSIS — E785 Hyperlipidemia, unspecified: Secondary | ICD-10-CM

## 2011-03-09 DIAGNOSIS — E119 Type 2 diabetes mellitus without complications: Secondary | ICD-10-CM

## 2011-03-09 LAB — LIPID PANEL
Cholesterol: 180 mg/dL (ref 0–200)
Triglycerides: 192 mg/dL — ABNORMAL HIGH (ref 0.0–149.0)

## 2011-03-09 MED ORDER — AMIODARONE HCL 200 MG PO TABS: 200.0000 mg | ORAL_TABLET | ORAL | Status: DC

## 2011-03-09 NOTE — Telephone Encounter (Signed)
Pt needs amiodarone to be refill.

## 2011-03-16 ENCOUNTER — Encounter: Payer: Self-pay | Admitting: Internal Medicine

## 2011-03-16 ENCOUNTER — Ambulatory Visit (INDEPENDENT_AMBULATORY_CARE_PROVIDER_SITE_OTHER): Payer: Managed Care, Other (non HMO) | Admitting: Internal Medicine

## 2011-03-16 DIAGNOSIS — E785 Hyperlipidemia, unspecified: Secondary | ICD-10-CM

## 2011-03-16 DIAGNOSIS — E119 Type 2 diabetes mellitus without complications: Secondary | ICD-10-CM

## 2011-03-16 NOTE — Patient Instructions (Signed)
Diabetes Monitor   The A1c test checks the average amount of glucose (sugar) in the blood over the last 2 to 3 months.As glucose circulates in the blood, some of it binds to hemoglobin A. This is the main form of hemoglobin in adults. Hemoglobin is a red protein that carries oxygen in the red blood cells (RBC's). Once the glucose is bound to the hemoglobin A, it remains there for the life of the red blood cell (about 120 days). This combination of glucose and hemoglobin A is called A1c (or hemoglobin A1c or glycohemoglobin). Increased glucose in the blood, increases the hemoglobin A1c. A1c levels do not change quickly but will shift as older RBC's die and younger ones take their place.   The A1c test is used primarily to monitor the glucose control of diabetics over time. The goal of those with diabetes is to keep their blood glucose levels as close to normal as possible. This helps to minimize the complications caused by chronically elevated glucose levels, such as progressive damage to body organs like the kidneys, eyes, cardiovascular system, and nerves. The A1c test gives a picture of the average amount of glucose in the blood over the last few months. It can help a patient and his doctor know if the measures they are taking to control the patient's diabetes are successful or need to be adjusted.     Depending on the type of diabetes that you have, how well your diabetes is controlled, your A1c may be measured 2 to 4 times each year. When someone is first diagnosed with diabetes or if control is not good, A1c may be ordered more frequently.   NORMAL VALUES  Non diabetic adults: 5 %-6.1%  Good diabetic control: 6.2-6.4 %  Fair diabetic control: 6.5-7%  Poor diabetic control: greater than 7 % ( except with additional factors such as  advanced age; significant coronary or neurologic disease,etc). Check the A1c every 6 months if it is < 6.5%; every 4 months if  6.5% or higher. Goals for home glucose  monitoring are : fasting  or morning glucose goal of  90-150. Two hours after any meal , goal = < 180, preferably < 150.       The most common cause of elevated triglycerides is the ingestion of sugar from high fructose corn syrup sources. You should consume less than 40 grams  of sugar per day from foods and drinks with high fructose corn syrup as number 2, 3, or #4 on the label.

## 2011-03-16 NOTE — Progress Notes (Signed)
Subjective:    Patient ID: Mike Jimenez, male    DOB: May 15, 1951, 60 y.o.   MRN: [id number]  HPI  Diabetes status assessment :Fasting or morning glucose range:  100-110 ; average :  Not calculated    ;   Highest glucose 2 hours after any meal:  <130 ;  Hypoglycemia :  no  .                                                           Excess thirst :yes;  Excess hunger:  no ;  Excess urination:  Yes ( on diuretic; increased caffeine) .                                            Lightheadedness with standing:  no ; Chest pain:  no ; Palpitations :no ;  Pain in  calves with walking:  no .                                                                                                                                                              Non healing skin  ulcers or sores,especially over the feet:  no ; Numbness or tingling or burning in feet : no.                                                                                                                                                 Significant change in  Weight : no  ;Vision changes : no  Exercise : walks & climbs stairs intermittently ;  Nutrition/diet:  Eats out alot;   Medication compliance : yes ; Medication adverse  Effects:  no  ;  Eye exam : due ; Foot care : yes ;  A1c/ urine microalbumin monitor:  6.6% ( up from 6.2%). Tg 192( prev 127)      Review of Systems     Objective:   Physical Exam General appearance is one of good health and nourishment.See current vital signs Heart:  Slow rate and regular rhythm. S1 and S2 normal without gallop, murmur, click, rub or other extra sounds.                                                                                                      Lungs:Chest clear to auscultation; no wheezes, rhonchi,rales ,or rubs present.No increased work of breathing.  All pulses intact without  bruits .No ischemic skin changes; abrasions over  shins.Good nail health.  Neuro: sensation to light touch was normal ; deep tendon reflexes were normal.  Assessment & Plan:  #1 diabetes, adequate control but the A1c is climbing. Goals and risks discussed  #2 rising triglycerides suggesting suboptimal nutrition.  Plan #1 no change in medications; low glycemic  carbohydrate intake was recommended. Examples of appropriate programs would be [employer] book.

## 2011-04-01 ENCOUNTER — Encounter: Payer: Self-pay | Admitting: Internal Medicine

## 2011-04-01 ENCOUNTER — Ambulatory Visit (INDEPENDENT_AMBULATORY_CARE_PROVIDER_SITE_OTHER): Payer: Managed Care, Other (non HMO) | Admitting: Internal Medicine

## 2011-04-01 DIAGNOSIS — I472 Ventricular tachycardia, unspecified: Secondary | ICD-10-CM

## 2011-04-01 DIAGNOSIS — Z95810 Presence of automatic (implantable) cardiac defibrillator: Secondary | ICD-10-CM

## 2011-04-01 DIAGNOSIS — I2589 Other forms of chronic ischemic heart disease: Secondary | ICD-10-CM

## 2011-04-01 DIAGNOSIS — I499 Cardiac arrhythmia, unspecified: Secondary | ICD-10-CM

## 2011-04-01 DIAGNOSIS — I509 Heart failure, unspecified: Secondary | ICD-10-CM

## 2011-04-01 MED ORDER — ATORVASTATIN CALCIUM 10 MG PO TABS: 10.0000 mg | ORAL_TABLET | Freq: Every day | ORAL | Status: DC

## 2011-04-01 NOTE — Progress Notes (Signed)
HPI [name] returns for followup. He is a pleasant middle-aged man with an ischemic cardiomyopathy, congestive heart failure, and ventricular tachycardia. He is status post ICD implantation. He has no specific complaints today except that he has had trouble losing weight. He admits to some dietary indiscretion. He states that he is about to start walking. He denies chest pain, shortness of breath, or peripheral edema. No syncope. Allergies  Allergen Reactions  . Ramipril     REACTION: cough     Current Outpatient Prescriptions  Medication Sig Dispense Refill  . amiodarone (PACERONE) 200 MG tablet Take 1 tablet (200 mg total) by mouth as directed. 1 tab by mouth in AM and 0.5 tab by mouth in PM  60 tablet  3  . aspirin 325 MG tablet Take 325 mg by mouth daily.        . carvedilol (COREG) 6.25 MG tablet Take 6.25 mg by mouth 2 (two) times daily with a meal.        . ferrous sulfate 325 (65 FE) MG tablet Take 325 mg by mouth 2 (two) times daily.        . furosemide (LASIX) 40 MG tablet Take 40 mg by mouth daily.        Marland Kitchen glucose blood test strip 1 each by Other route as needed. [employer], 250.00, check once daily       . Lancet Device MISC by Does not apply route. Wavesense ultra-thin lancets misc, 250.00, check once daily       . levothyroxine (SYNTHROID, LEVOTHROID) 50 MCG tablet Take 50 mcg by mouth daily.        Marland Kitchen losartan (COZAAR) 25 MG tablet Take 25 mg by mouth daily.        . metFORMIN (GLUCOPHAGE) 1000 MG tablet Take 1,000 mg by mouth 2 (two) times daily.        . Multiple Vitamin (MULTIVITAMIN) tablet Take 1 tablet by mouth daily.        . niacin 500 MG tablet Take 500 mg by mouth daily.        Marland Kitchen POTASSIUM CHLORIDE PO Take by mouth daily.        Marland Kitchen VITAMIN E PO Take by mouth as needed.        Marland Kitchen DISCONTD: fish oil-omega-3 fatty acids 1000 MG capsule Take 2 g by mouth as needed.        Marland Kitchen DISCONTD: simvastatin (ZOCOR) 40 MG tablet Take 40 mg by mouth at bedtime.          Marland Kitchen atorvastatin (LIPITOR) 10 MG tablet Take 1 tablet (10 mg total) by mouth daily.  30 tablet  11     Past Medical History  Diagnosis Date  . Diabetes mellitus   . Hyperlipidemia   . Hypertension   . Hypothyroidism   . Carpal tunnel syndrome   . Anemia   . Cardiomyopathy     Dr. Ladona Ridgel, h/o VT..s/p biventricular ICD, non-ischemic CM--EF 15%    ROS:   All systems reviewed and negative except as noted in the HPI.   Past Surgical History  Procedure Date  . Ptx     traumatic, age 51 months  . Vasectomy   . Cardiac defibrillator placement 03/2010     Family History  Problem Relation Age of Onset  . Diabetes Maternal Grandfather   . Diabetes Maternal Aunt   . Colon cancer Maternal Aunt   . Lung cancer Paternal Uncle   . Lung cancer Maternal  Uncle   . Asthma Sister   . Atrial fibrillation Father      History   Social History  . Marital Status: Married    Spouse Name: N/A    Number of Children: N/A  . Years of Education: N/A   Occupational History  . Not on file.   Social History Main Topics  . Smoking status: Former Smoker    Quit date: 11/29/1969  . Smokeless tobacco: Not on file   Comment: startes at age 27, 2.5 ppd  . Alcohol Use: Yes  . Drug Use: No  . Sexually Active: Not on file   Other Topics Concern  . Not on file   Social History Narrative  . No narrative on file     BP 132/72  Pulse 68  Ht 6\' 2"  (1.88 m)  Wt 281 lb (127.461 kg)  BMI 36.08 kg/m2  Physical Exam:  Well appearing NAD HEENT: Unremarkable Neck:  No JVD, no thyromegally Lymphatics:  No adenopathy Back:  No CVA tenderness Lungs:  Clear. Well-healed ICD incision. HEART:  Regular rate rhythm, no murmurs, no rubs, no clicks Abd:  Flat, positive bowel sounds, no organomegally, no rebound, no guarding Ext:  2 plus pulses, no edema, no cyanosis, no clubbing Skin:  No rashes no nodules Neuro:  CN II through XII intact, motor grossly intact  DEVICE  Normal device function.   See PaceArt for details.   Assess/Plan:

## 2011-04-01 NOTE — Assessment & Plan Note (Signed)
His device is working normally. We'll recheck in several months. 

## 2011-04-01 NOTE — Assessment & Plan Note (Signed)
He has had no recurrent arrhythmias. He will continue his current dose of amiodarone. When I see him back, I may consider reducing the dose of amiodarone from 300 down to 200 mg daily.

## 2011-04-01 NOTE — Patient Instructions (Signed)
Your physician wants you to follow-up in: 1 year with Dr Court Joy will receive a reminder letter in the mail two months in advance. If you don't receive a letter, please call our office to schedule the follow-up appointment.   Remote monitoring is used to monitor your Pacemaker of ICD from home. This monitoring reduces the number of office visits required to check your device to one time per year. It allows [place] to keep an eye on the functioning of your device to ensure it is working properly. You are scheduled for a device check from home on 07/01/2011. You may send your transmission at any time that day. If you have a wireless device, the transmission will be sent automatically. After your physician reviews your transmission, you will receive a postcard with your next transmission date.   Your physician has recommended you make the following change in your medication: stop Simvastatin and start Lipitor 10mg  daily

## 2011-04-16 NOTE — Assessment & Plan Note (Signed)
[facility]                        [place] OFFICE NOTE   NAME:Mike Jimenez, Mike Jimenez                    MRN:          [id number]  DATE:02/03/2007                            DOB:          July 27, 1951    Mike Jimenez was seen February 03, 2007 to follow up labs generated  at his physical exam January 25, 2007.   His past history had been reviewed.  He had a traumatic pneumothorax at  age 60.  He had multiple traumatic fractures.  He has also had a  vasectomy.  As an adolescent he had asthma.  Colonoscopy was negative in  2002.  In 2000 he was evaluated for anemia;the gastrointestinal  evaluation by Dr. Marina Goodell was negative.   FAMILY HISTORY:  Positive for stroke, diabetes, colon cancer, lung  cancer, asthma, atrial fibrillation.   He quit smoking in 1972.  He drinks socially.   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  He has decreased  cardiovascular exercise.  He is on no diet except for no bad carbs.   At the time of the exam, there were no active cardiopulmonary findings.   HIS glucose was 146, and A1c was 7.9.  This would be compatible with a  58% increased stroke or heart attack risk & an average sugar of  approximately 200.  In reviewing the chart in October of 2006, A1c was  5.8.  This would be a 16% risk, and in January it was 6.4, which would  be a 28% risk.  He has been on glipizide 5 mg daily.   His triglycerides are 194; these have dropped dramatically since the  value of 497, November of 2004.  HDL is mildly reduced at 36. The  triglycerides have normalized at less than 150.  His LDL or bad  cholesterol is 170; based on the NMR profile it should be less than 100.  Presently, he would still have over a 20% risk of heart attack or stroke  based on his LDL.  Blood pressure is well-controlled at 110/80.   A goal sheet and letter explaining A1c were provided.  It will be recommended that he change to glimepiride 4 mg 1/2  twice a  day and metformin ER 500 mg twice a day, pravastatin 40 mg at bedtime.   The pathophysiology of the metabolic syndrome and insulin resistance,  and increased cardiovascular risk was discussed.   I will ask him to concentrate on avoiding the hyperglycemic carbs and  instead eating wild rice, wheat bread/pasta.  High fructose corn syrup  as number 1, 2, or 3 on the label on foods or drinks should be avoided.  I have also recommended the Flat BellyDiet found at [email or web address].   Following the initiation of cardiovascular exercise walking 30-45  minutes 3 to 4 times a week, carb and high fructose restriction, and  implementation of the meds, the labs will be repeated in 3 months.  This  will be reassessed at that time.  Mike Jimenez is an intelligent individual  and is motivated to address these risks.     Mike Jimenez. Alwyn Ren, MD,FACP,FCCP  Electronically Signed    [facility]  DD: 02/03/2007  DT: 02/03/2007  Job #: [id number]

## 2011-04-25 ENCOUNTER — Other Ambulatory Visit: Payer: Self-pay | Admitting: Internal Medicine

## 2011-06-01 ENCOUNTER — Other Ambulatory Visit: Payer: Self-pay | Admitting: Internal Medicine

## 2011-06-30 ENCOUNTER — Encounter: Payer: Self-pay | Admitting: Internal Medicine

## 2011-06-30 ENCOUNTER — Other Ambulatory Visit: Payer: Self-pay | Admitting: Internal Medicine

## 2011-07-01 ENCOUNTER — Ambulatory Visit (INDEPENDENT_AMBULATORY_CARE_PROVIDER_SITE_OTHER): Payer: Managed Care, Other (non HMO) | Admitting: *Deleted

## 2011-07-01 DIAGNOSIS — I509 Heart failure, unspecified: Secondary | ICD-10-CM

## 2011-07-01 DIAGNOSIS — I472 Ventricular tachycardia: Secondary | ICD-10-CM

## 2011-07-01 DIAGNOSIS — I499 Cardiac arrhythmia, unspecified: Secondary | ICD-10-CM

## 2011-07-01 DIAGNOSIS — Z95810 Presence of automatic (implantable) cardiac defibrillator: Secondary | ICD-10-CM

## 2011-07-01 LAB — REMOTE ICD DEVICE
AL AMPLITUDE: 2.5 mv
BAMS-0001: 170 {beats}/min
BATTERY VOLTAGE: 3.1649 V
BRDY-0002LV: 50 {beats}/min
CHARGE TIME: 9.058 s
FVT: 0
LV LEAD THRESHOLD: 1 V
RV LEAD AMPLITUDE: 13.5 mv
RV LEAD IMPEDENCE ICD: 494 Ohm
TOT-0001: 1
TZAT-0001ATACH: 2
TZAT-0001ATACH: 3
TZAT-0001SLOWVT: 1
TZAT-0001SLOWVT: 2
TZAT-0002ATACH: NEGATIVE
TZAT-0004FASTVT: 8
TZAT-0004FASTVT: 8
TZAT-0004SLOWVT: 8
TZAT-0004SLOWVT: 8
TZAT-0005FASTVT: 88 pct
TZAT-0005FASTVT: 91 pct
TZAT-0012ATACH: 150 ms
TZAT-0012ATACH: 150 ms
TZAT-0012FASTVT: 200 ms
TZAT-0012FASTVT: 200 ms
TZAT-0013FASTVT: 1
TZAT-0013FASTVT: 2
TZAT-0013SLOWVT: 2
TZAT-0013SLOWVT: 2
TZAT-0018ATACH: NEGATIVE
TZAT-0018ATACH: NEGATIVE
TZAT-0018SLOWVT: NEGATIVE
TZAT-0019SLOWVT: 8 V
TZAT-0020ATACH: 1.5 ms
TZAT-0020ATACH: 1.5 ms
TZAT-0020SLOWVT: 1.5 ms
TZAT-0020SLOWVT: 1.5 ms
TZON-0003VSLOWVT: 450 ms
TZON-0004SLOWVT: 32
TZON-0004VSLOWVT: 20
TZST-0001ATACH: 6
TZST-0001FASTVT: 4
TZST-0001FASTVT: 6
TZST-0001SLOWVT: 3
TZST-0001SLOWVT: 5
TZST-0002ATACH: NEGATIVE
TZST-0002ATACH: NEGATIVE
TZST-0002ATACH: NEGATIVE
TZST-0003FASTVT: 35 J
TZST-0003FASTVT: 35 J
TZST-0003SLOWVT: 35 J

## 2011-07-06 ENCOUNTER — Encounter: Payer: Self-pay | Admitting: *Deleted

## 2011-07-07 NOTE — Progress Notes (Signed)
icd remote check w/icm  

## 2011-07-15 ENCOUNTER — Other Ambulatory Visit: Payer: Self-pay | Admitting: Internal Medicine

## 2011-07-15 DIAGNOSIS — E119 Type 2 diabetes mellitus without complications: Secondary | ICD-10-CM

## 2011-07-16 ENCOUNTER — Ambulatory Visit: Payer: Managed Care, Other (non HMO)

## 2011-07-16 ENCOUNTER — Other Ambulatory Visit: Payer: Self-pay | Admitting: Internal Medicine

## 2011-07-16 DIAGNOSIS — Z0289 Encounter for other administrative examinations: Secondary | ICD-10-CM

## 2011-07-16 NOTE — Telephone Encounter (Signed)
TSH 244.9 due  

## 2011-07-16 NOTE — Progress Notes (Signed)
Labs only

## 2011-07-23 ENCOUNTER — Other Ambulatory Visit: Payer: Self-pay | Admitting: Internal Medicine

## 2011-07-23 DIAGNOSIS — E119 Type 2 diabetes mellitus without complications: Secondary | ICD-10-CM

## 2011-07-26 ENCOUNTER — Other Ambulatory Visit (INDEPENDENT_AMBULATORY_CARE_PROVIDER_SITE_OTHER): Payer: Managed Care, Other (non HMO)

## 2011-07-26 DIAGNOSIS — E119 Type 2 diabetes mellitus without complications: Secondary | ICD-10-CM

## 2011-07-26 NOTE — Progress Notes (Signed)
Labs only

## 2011-07-26 NOTE — Progress Notes (Signed)
Quick Note:  Diabetes Monitor :NORMAL VALUES  Non diabetic adults: 5 %-6.1%  Good diabetic control: 6.2-6.4 %  Fair diabetic control: 6.5-7%  Poor diabetic control: greater than 7 % ( except with additional factors such as advanced age; significant coronary or neurologic disease,etc). Check the A1c every 6 months if it is < 6.5%; every 4 months if 6.5% or higher. Goals for home glucose monitoring are : fasting or morning glucose goal of 90-150. Two hours after any meal , goal = < 180, preferably < 150. Eat a low-fat diet with lots of fruits and vegetables, up to 7-9 servings per day. Avoid obesity; your goal is waist measurement < 40 inches.Consume less than 40 grams of sugar per day from foods & drinks with High Fructose Corn Sugar as #1,2,3 or # 4 on label. Follow the low carb nutrition program in [employer] as closely as possible to prevent Diabetes progression & complications. White carbohydrates (potatoes, rice, bread, and pasta) have a high spike of sugar and a high load of sugar. For example a baked potato has a cup of sugar and a french fry 2 teaspoons of sugar. Yams, wild rice, whole grained bread & wheat pasta have been much lower spike and load of sugar. Portions should be the size of a deck of cards or your palm. Hopp     ______

## 2011-08-04 ENCOUNTER — Ambulatory Visit (INDEPENDENT_AMBULATORY_CARE_PROVIDER_SITE_OTHER): Payer: Managed Care, Other (non HMO) | Admitting: Internal Medicine

## 2011-08-04 VITALS — BP 130/90 | HR 58 | Wt 287.0 lb

## 2011-08-04 DIAGNOSIS — E039 Hypothyroidism, unspecified: Secondary | ICD-10-CM

## 2011-08-04 DIAGNOSIS — E119 Type 2 diabetes mellitus without complications: Secondary | ICD-10-CM

## 2011-08-04 DIAGNOSIS — I10 Essential (primary) hypertension: Secondary | ICD-10-CM

## 2011-08-04 DIAGNOSIS — E049 Nontoxic goiter, unspecified: Secondary | ICD-10-CM

## 2011-08-04 DIAGNOSIS — E785 Hyperlipidemia, unspecified: Secondary | ICD-10-CM

## 2011-08-04 NOTE — Progress Notes (Signed)
  Subjective:    Patient ID: Mike Jimenez, male    DOB: [birth year], 60 y.o.   MRN: [id number]  HPI #1 HYPERTENSION: Disease Monitoring: Blood pressure range-not monitoring, cuff broken  Chest pain, palpitations- no       Dyspnea- no Medications: Compliance- yes  Lightheadedness,Syncope- no    Edema- no  #2 DIABETES:A1c 6.7% Disease Monitoring: Blood Sugar ranges-FBS 100-139  Polyuria/phagia/dipsia- no       Visual problems- no; last exam 5/12: no retinopathy Medications: Compliance- yes  Hypoglycemic symptoms- only with yard work in context of poor intake  #3 HYPERLIPIDEMIA: TG 192,LDL 104, HDL 37.50 in 4/12 Disease Monitoring: See symptoms for Hypertension Medications: Compliance- yes  Abd pain, bowel changes- loose stool for 3-4 months intermittently. Better off  ALL meds for 24-48 hrs.   Muscle aches- no    ROS See HPI above   WUJ:WJXB colonoscopy 2011 was negative Smoking Status noted :quit 1971       Review of Systems loose stool usually during day; 2nd dose Metformin not always taken with meal     Objective:   Physical Exam Gen.: Weight excess. Alert, appropriate and cooperative throughout exam. Eyes: No corneal or conjunctival inflammation noted.  Extraocular motion intact. No lid lag Neck: No deformities, masses, or tenderness noted. Large goiter suggested on R Lungs: Normal respiratory effort; chest expands symmetrically. Lungs are clear to auscultation without rales, wheezes, or increased work of breathing. Heart: Normal rate and rhythm. Normal S1 and S2. No gallop, click, or rub. No murmur but slight slurring 1st heart sound. Abdomen: Bowel sounds normal; abdomen soft and nontender. No masses, organomegaly or hernias noted.                                                                            Musculoskeletal/extremities:  No clubbing, cyanosis, edema noted. Joints normal except minor flexion R 5th DIP. Nail health  good. Vascular: Carotid, radial  artery, dorsalis pedis and  posterior tibial pulses are full and equal. No bruits present. Neurologic: Alert and oriented x3. Deep tendon reflexes symmetrical and normal.No tremor. Light touch normal over feet.          Skin: Intact without suspicious lesions or rashes. Lymph: No cervical, axillary  lymphadenopathy present. Psych: Mood and affect are normal. Normally interactive                                                                                         Assessment & Plan:  #1 diabetes, adequate control, but  A1c up slightly  #2 intermittent loose stools; rule out relationship to metformin. Critical need to take with the meal discussed.  #3 questionable goiter; Note: on Amiodarone  #4 dyslipidemia; triglycerides greater than 150 suggests dietary contribution  Plan: See orders and recommendations.

## 2011-08-04 NOTE — Patient Instructions (Addendum)
Eat a low-fat diet with lots of fruits and vegetables, up to 7-9 servings per day. Avoid obesity; your goal is waist measurement < 40 inches.Consume less than 40 grams of sugar per day from foods & drinks with High Fructose Corn Sugar as #1,2,3 or # 4 on label. Follow the low carb nutrition program in [employer] as closely as possible to prevent Diabetes progression & complications. White carbohydrates (potatoes, rice, bread, and pasta) have a high spike of sugar and a high load of sugar. For example a  baked potato has a cup of sugar and a  french fry  2 teaspoons of sugar. Yams, wild  rice, whole grained bread &  wheat pasta have been much lower spike and load of  sugar. Portions should be the size of a deck of cards or your palm.   Always take the metformin with a meal. If your stools are loose; see if this is related to any particular foods such as milk or dairy or wheat (gluten). Please take the probiotic , Align, every day until the bowels are normal. This will replace the normal bacteria which  are necessary for formation of normal stool and processing of food.

## 2011-08-09 ENCOUNTER — Ambulatory Visit (HOSPITAL_BASED_OUTPATIENT_CLINIC_OR_DEPARTMENT_OTHER)
Admission: RE | Admit: 2011-08-09 | Discharge: 2011-08-09 | Disposition: A | Discharge status: Home / Self Care | Payer: Managed Care, Other (non HMO) | Source: Ambulatory Visit | Attending: Internal Medicine | Admitting: Internal Medicine

## 2011-08-09 DIAGNOSIS — R490 Dysphonia: Secondary | ICD-10-CM | POA: Insufficient documentation

## 2011-08-09 DIAGNOSIS — E049 Nontoxic goiter, unspecified: Secondary | ICD-10-CM | POA: Insufficient documentation

## 2011-08-23 ENCOUNTER — Telehealth: Payer: Self-pay | Admitting: Internal Medicine

## 2011-08-23 ENCOUNTER — Other Ambulatory Visit: Payer: Self-pay | Admitting: Internal Medicine

## 2011-08-23 DIAGNOSIS — E041 Nontoxic single thyroid nodule: Secondary | ICD-10-CM

## 2011-08-23 NOTE — Telephone Encounter (Signed)
PATIENT CALLING, STATES AFTER HIS THYROID U/S, IT WAS MENTIONED TO HIM ABOUT HAVING A BIOPSY.  PATIENT STATES HE WOULD LIKE DR. HOPPER TO SEND HIM FOR BIOPSY.  PLEASE ADVISE.

## 2011-08-31 ENCOUNTER — Inpatient Hospital Stay: Admission: RE | Admit: 2011-08-31 | Payer: Managed Care, Other (non HMO) | Source: Ambulatory Visit

## 2011-09-07 ENCOUNTER — Ambulatory Visit
Admission: RE | Admit: 2011-09-07 | Discharge: 2011-09-07 | Disposition: A | Discharge status: Home / Self Care | Payer: Managed Care, Other (non HMO) | Source: Ambulatory Visit | Attending: Internal Medicine | Admitting: Internal Medicine

## 2011-09-07 ENCOUNTER — Other Ambulatory Visit (HOSPITAL_COMMUNITY)
Admission: RE | Admit: 2011-09-07 | Discharge: 2011-09-07 | Disposition: A | Discharge status: Home / Self Care | Payer: Managed Care, Other (non HMO) | Source: Ambulatory Visit | Attending: Interventional Radiology | Admitting: Interventional Radiology

## 2011-09-07 DIAGNOSIS — E049 Nontoxic goiter, unspecified: Secondary | ICD-10-CM | POA: Insufficient documentation

## 2011-09-08 ENCOUNTER — Ambulatory Visit (INDEPENDENT_AMBULATORY_CARE_PROVIDER_SITE_OTHER): Payer: Managed Care, Other (non HMO)

## 2011-09-08 DIAGNOSIS — Z23 Encounter for immunization: Secondary | ICD-10-CM

## 2011-09-15 ENCOUNTER — Other Ambulatory Visit: Payer: Self-pay | Admitting: Internal Medicine

## 2011-09-15 DIAGNOSIS — C73 Malignant neoplasm of thyroid gland: Secondary | ICD-10-CM

## 2011-09-16 ENCOUNTER — Other Ambulatory Visit: Payer: Self-pay | Admitting: Internal Medicine

## 2011-09-16 DIAGNOSIS — C73 Malignant neoplasm of thyroid gland: Secondary | ICD-10-CM

## 2011-09-17 ENCOUNTER — Other Ambulatory Visit (INDEPENDENT_AMBULATORY_CARE_PROVIDER_SITE_OTHER): Payer: Managed Care, Other (non HMO)

## 2011-09-17 DIAGNOSIS — Z111 Encounter for screening for respiratory tuberculosis: Secondary | ICD-10-CM

## 2011-09-17 DIAGNOSIS — C73 Malignant neoplasm of thyroid gland: Secondary | ICD-10-CM

## 2011-09-17 NOTE — Progress Notes (Signed)
Addended by: Doristine Devoid on: 09/17/2011 08:40 AM   Modules accepted: Orders

## 2011-09-17 NOTE — Progress Notes (Signed)
12  

## 2011-09-19 ENCOUNTER — Encounter: Payer: Self-pay | Admitting: Internal Medicine

## 2011-09-19 DIAGNOSIS — E041 Nontoxic single thyroid nodule: Secondary | ICD-10-CM | POA: Insufficient documentation

## 2011-09-20 LAB — TB SKIN TEST: Induration: 0

## 2011-09-20 LAB — THYROGLOBULIN LEVEL: Thyroglobulin Ab: <20 U/mL (ref <40.0)

## 2011-09-21 LAB — CALCITONIN: Calcitonin: <2 pg/mL

## 2011-09-30 ENCOUNTER — Encounter: Payer: Self-pay | Admitting: Internal Medicine

## 2011-09-30 ENCOUNTER — Other Ambulatory Visit: Payer: Self-pay | Admitting: Internal Medicine

## 2011-09-30 ENCOUNTER — Ambulatory Visit (INDEPENDENT_AMBULATORY_CARE_PROVIDER_SITE_OTHER): Payer: Managed Care, Other (non HMO) | Admitting: *Deleted

## 2011-09-30 DIAGNOSIS — Z95810 Presence of automatic (implantable) cardiac defibrillator: Secondary | ICD-10-CM

## 2011-09-30 DIAGNOSIS — I509 Heart failure, unspecified: Secondary | ICD-10-CM

## 2011-09-30 DIAGNOSIS — I499 Cardiac arrhythmia, unspecified: Secondary | ICD-10-CM

## 2011-10-03 LAB — REMOTE ICD DEVICE
AL AMPLITUDE: 2.75 mv
ATRIAL PACING ICD: 4.63 pct
BAMS-0001: 170 {beats}/min
BATTERY VOLTAGE: 3.1517 V
BRDY-0002LV: 50 {beats}/min
FVT: 0
LV LEAD IMPEDENCE ICD: 855 Ohm
LV LEAD THRESHOLD: 1 V
RV LEAD AMPLITUDE: 13.625 mv
RV LEAD IMPEDENCE ICD: 494 Ohm
TOT-0006: 20110516000000
TZAT-0001SLOWVT: 1
TZAT-0001SLOWVT: 2
TZAT-0002ATACH: NEGATIVE
TZAT-0002ATACH: NEGATIVE
TZAT-0002ATACH: NEGATIVE
TZAT-0005FASTVT: 88 pct
TZAT-0005FASTVT: 91 pct
TZAT-0011FASTVT: 10 ms
TZAT-0011FASTVT: 10 ms
TZAT-0011SLOWVT: 10 ms
TZAT-0012FASTVT: 200 ms
TZAT-0012FASTVT: 200 ms
TZAT-0018ATACH: NEGATIVE
TZAT-0018FASTVT: NEGATIVE
TZAT-0018FASTVT: NEGATIVE
TZAT-0018SLOWVT: NEGATIVE
TZAT-0018SLOWVT: NEGATIVE
TZAT-0019ATACH: 6 V
TZAT-0019ATACH: 6 V
TZAT-0019FASTVT: 8 V
TZAT-0019SLOWVT: 8 V
TZAT-0019SLOWVT: 8 V
TZAT-0020ATACH: 1.5 ms
TZAT-0020SLOWVT: 1.5 ms
TZAT-0020SLOWVT: 1.5 ms
TZON-0003FASTVT: 250 ms
TZON-0005SLOWVT: 12
TZST-0001ATACH: 5
TZST-0001ATACH: 6
TZST-0001FASTVT: 4
TZST-0001FASTVT: 6
TZST-0001SLOWVT: 3
TZST-0001SLOWVT: 4
TZST-0001SLOWVT: 5
TZST-0002ATACH: NEGATIVE
TZST-0002ATACH: NEGATIVE
TZST-0003FASTVT: 35 J
TZST-0003FASTVT: 35 J
TZST-0003FASTVT: 35 J
TZST-0003SLOWVT: 35 J
VF: 0

## 2011-10-04 ENCOUNTER — Encounter (INDEPENDENT_AMBULATORY_CARE_PROVIDER_SITE_OTHER): Payer: Self-pay | Admitting: Surgery

## 2011-10-04 ENCOUNTER — Encounter (INDEPENDENT_AMBULATORY_CARE_PROVIDER_SITE_OTHER): Payer: Self-pay

## 2011-10-04 ENCOUNTER — Ambulatory Visit (INDEPENDENT_AMBULATORY_CARE_PROVIDER_SITE_OTHER): Payer: Managed Care, Other (non HMO) | Admitting: Surgery

## 2011-10-04 DIAGNOSIS — E041 Nontoxic single thyroid nodule: Secondary | ICD-10-CM

## 2011-10-04 NOTE — Progress Notes (Signed)
Chief Complaint  Patient presents with  . Thyroid Cancer    new diagnosis - referral by Dr. Marga Melnick    HISTORY: Patient is a 60 year old white male referred by his primary care physician for suspected papillary thyroid carcinoma. She was evaluated in September 2012. He was found on physical exam to have a right thyroid nodule. He underwent thyroid ultrasound which showed a small thyroid goiter noted there was a 2.8 cm complex nodule in the right thyroid lobe. There was a 1.4 cm nodule in the left thyroid lobe. Patient underwent ultrasound-guided biopsy of the right thyroid nodule. Cytopathology is suspicious for papillary thyroid carcinoma. Patient is now referred to surgery for thyroidectomy for definitive diagnosis.  Patient has no prior history of thyroid disease other than hypothyroidism. He has been on levothyroxine for over 10 years. He has had no prior head or neck surgery. Patient does note significant hoarseness over the past one to 2 years. Etiology is unknown but was felt to be related to some of his cardiac medications were industrial exposure.  There is a family history of hypothyroidism and the patient's mother. There is no family history of thyroid cancer. There is no family history of other endocrinopathy.   Past Medical History  Diagnosis Date  . Diabetes mellitus   . Hyperlipidemia   . Hypertension   . Hypothyroidism   . Carpal tunnel syndrome   . Anemia   . Cardiomyopathy     Dr. Ladona Ridgel, h/o VT..s/p biventricular ICD, non-ischemic CM--EF 15%     Current Outpatient Prescriptions  Medication Sig Dispense Refill  . amiodarone (PACERONE) 200 MG tablet Take 1 tablet (200 mg total) by mouth as directed. 1 tab by mouth in AM and 0.5 tab by mouth in PM  60 tablet  3  . aspirin 325 MG tablet Take 325 mg by mouth daily.        Marland Kitchen atorvastatin (LIPITOR) 10 MG tablet Take 1 tablet (10 mg total) by mouth daily.  30 tablet  11  . carvedilol (COREG) 6.25 MG tablet TAKE ONE  TABLET BY MOUTH TWICE DAILY WITH MEALS  60 tablet  5  . ferrous sulfate 325 (65 FE) MG tablet Take 325 mg by mouth 2 (two) times daily.        . furosemide (LASIX) 40 MG tablet TAKE ONE TABLET BY MOUTH ONE TIME DAILY  30 tablet  11  . glucose blood test strip 1 each by Other route as needed. [employer], 250.00, check once daily       . Lancet Device MISC by Does not apply route. Wavesense ultra-thin lancets misc, 250.00, check once daily       . levothyroxine (SYNTHROID, LEVOTHROID) 50 MCG tablet TAKE ONE TABLET BY MOUTH ONE TIME DAILY  90 tablet  0  . losartan (COZAAR) 25 MG tablet Take 25 mg by mouth daily.        . metFORMIN (GLUCOPHAGE) 1000 MG tablet Take 1,000 mg by mouth 2 (two) times daily.        . Multiple Vitamin (MULTIVITAMIN) tablet Take 1 tablet by mouth daily.        . niacin 500 MG tablet Take 100 mg by mouth daily.       Marland Kitchen POTASSIUM CHLORIDE PO Take by mouth daily.        Marland Kitchen VITAMIN E PO Take by mouth as needed.           Allergies  Allergen Reactions  . Ramipril  REACTION: cough     Family History  Problem Relation Age of Onset  . Diabetes Maternal Grandfather   . Diabetes Maternal Aunt   . Colon cancer Maternal Aunt   . Lung cancer Paternal Uncle   . Lung cancer Maternal Uncle   . Asthma Sister   . Atrial fibrillation Father      History   Social History  . Marital Status: Married    Spouse Name: N/A    Number of Children: N/A  . Years of Education: N/A   Social History Main Topics  . Smoking status: Former Games developer  . Smokeless tobacco: None   Comment: startes at age 81, 2.5 ppd - quit in 1972  . Alcohol Use: Yes  . Drug Use: No  . Sexually Active: None   Other Topics Concern  . None   Social History Narrative  . None     REVIEW OF SYSTEMS - PERTINENT POSITIVES ONLY: Significant hoarseness for 2 years. No compressive symptoms. No tremor.   EXAM: Filed Vitals:   10/04/11 1208  BP: 142/78  Pulse: 100  Temp: 97 F  (36.1 C)  Resp: 18    HEENT: normocephalic; pupils equal and reactive; sclerae clear; dentition good; mucous membranes moist NECK:  Large palpable mass in the lateral portion of the right thyroid lobe measuring approximately 3 cm in size. This is mobile. It is nontender. Isthmus was normal. Left lobe is without palpable abnormality; symmetric on extension; no palpable anterior or posterior cervical lymphadenopathy; no supraclavicular masses; no tenderness CHEST: clear to auscultation bilaterally without rales, rhonchi, or wheezes; AICD unit upper chest wall on left CARDIAC: regular rate and rhythm without significant murmur; peripheral pulses are full EXT:  non-tender without edema; no deformity NEURO: no gross focal deficits; no sign of tremor   LABORATORY RESULTS: See E-Chart for most recent results   RADIOLOGY RESULTS: See E-Chart or I-Site for most recent results   IMPRESSION: #1 dominant right thyroid nodule, 2.8 cm, with cytopathology suspicious for papillary thyroid carcinoma #2 left thyroid nodule, 1.4 cm #3 history of hypothyroidism #4 chronic hoarseness #5 cardiomyopathy with AICD unit   PLAN: The patient and I had a lengthy discussion about the above findings. I have recommended total thyroidectomy with limited lymph node dissection on the presumption that this is a papillary thyroid carcinoma. We discussed the procedure. We discussed the hospital stay. We discussed potential complications including recurrent laryngeal nerve injury and injury to parathyroid glands. We discussed the need for endocrinology consultation and adjuvant treatment with radioactive iodine.  The patient's hoarseness is of particular concern. If this is related to the neoplasm, he will likely require partial resection of the right recurrent laryngeal nerve. This could result in significant hoarseness postoperatively. Patient understands.  Patient wishes to proceed with surgery sometime in the near  future. We will offer him potential dates in December 2012 at his request.  The risks and benefits of the procedure have been discussed at length with the patient.  The patient understands the proposed procedure, potential alternative treatments, and the course of recovery to be expected.  All of the patient's questions have been answered at this time.  The patient wishes to proceed with surgery and will schedule a date for their procedure through our office staff.   Velora Heckler, MD, FACS General & Endocrine Surgery [facility]      Visit Diagnoses: 1. Thyroid nodule     Primary Care Physician: Marga Melnick, MD, MD  Cardiology: Dr. Lewayne Bunting

## 2011-10-08 ENCOUNTER — Other Ambulatory Visit: Payer: Self-pay | Admitting: Internal Medicine

## 2011-10-08 NOTE — Telephone Encounter (Signed)
Patient was to have labs at 07/2011 OV

## 2011-10-14 ENCOUNTER — Telehealth: Payer: Self-pay | Admitting: Internal Medicine

## 2011-10-14 NOTE — Telephone Encounter (Signed)
Called and spoke with Zella Ball and let her know that Dr Ladona Ridgel is in the office tomorrow and would have him sign and will fax over to her as soon as it's complete

## 2011-10-14 NOTE — Telephone Encounter (Signed)
New problem:  Per Robin checking on status of cardiac clearance.

## 2011-10-15 NOTE — Progress Notes (Signed)
icd remote check w/icm  

## 2011-10-22 ENCOUNTER — Encounter: Payer: Self-pay | Admitting: *Deleted

## 2011-11-18 ENCOUNTER — Other Ambulatory Visit: Payer: Self-pay | Admitting: Internal Medicine

## 2011-12-02 ENCOUNTER — Encounter (HOSPITAL_COMMUNITY): Payer: Self-pay | Admitting: Pharmacy Technician

## 2011-12-06 ENCOUNTER — Encounter (HOSPITAL_COMMUNITY): Payer: Self-pay

## 2011-12-06 ENCOUNTER — Encounter (HOSPITAL_COMMUNITY)
Admission: RE | Admit: 2011-12-06 | Discharge: 2011-12-06 | Disposition: A | Discharge status: Home / Self Care | Payer: Managed Care, Other (non HMO) | Source: Ambulatory Visit | Attending: Anesthesiology | Admitting: Anesthesiology

## 2011-12-06 ENCOUNTER — Other Ambulatory Visit: Payer: Self-pay

## 2011-12-06 ENCOUNTER — Encounter (HOSPITAL_COMMUNITY)
Admission: RE | Admit: 2011-12-06 | Discharge: 2011-12-06 | Disposition: A | Discharge status: Home / Self Care | Payer: Managed Care, Other (non HMO) | Source: Ambulatory Visit | Attending: Surgery | Admitting: Surgery

## 2011-12-06 LAB — BASIC METABOLIC PANEL
CO2: 29 mEq/L (ref 19–32)
Chloride: 102 mEq/L (ref 96–112)
GFR calc Af Amer: 70 mL/min — ABNORMAL LOW (ref >90)
Potassium: 4.3 mEq/L (ref 3.5–5.1)
Sodium: 143 mEq/L (ref 135–145)

## 2011-12-06 LAB — URINALYSIS, ROUTINE W REFLEX MICROSCOPIC
Glucose, UA: NEGATIVE mg/dL
Leukocytes, UA: NEGATIVE
Nitrite: NEGATIVE
Protein, ur: NEGATIVE mg/dL
pH: 5 (ref 5.0–8.0)

## 2011-12-06 LAB — CBC
HCT: 42.1 % (ref 39.0–52.0)
MCV: 89.6 fL (ref 78.0–100.0)
RBC: 4.7 MIL/uL (ref 4.22–5.81)
WBC: 5.3 10*3/uL (ref 4.0–10.5)

## 2011-12-06 LAB — SURGICAL PCR SCREEN
MRSA, PCR: NEGATIVE
Staphylococcus aureus: POSITIVE — AB

## 2011-12-06 LAB — PROTIME-INR: INR: 0.92 (ref 0.00–1.49)

## 2011-12-06 NOTE — Progress Notes (Signed)
Quick Note:  These results are acceptable for scheduled surgery. TMG ______ 

## 2011-12-06 NOTE — Pre-Procedure Instructions (Signed)
Mike Jimenez  12/06/2011   Your procedure is scheduled on:  Dec 10, 2011  Report to [facility] at 0730 AM.  Call this number if you have problems the morning of surgery: [phone]   Remember:   Do not eat food:After Midnight.  May have clear liquids: up to 4 Hours before arrival.  Clear liquids include soda, tea, black coffee, apple or grape juice, broth.  Take these medicines the morning of surgery with A SIP OF WATER: Coreg, Synthroid, stop Aspirin   Do not wear jewelry, make-up or nail polish.  Do not wear lotions, powders, or perfumes. You may wear deodorant.  Do not shave 48 hours prior to surgery.  Do not bring valuables to the hospital.  Contacts, dentures or bridgework may not be worn into surgery.  Leave suitcase in the car. After surgery it may be brought to your room.  For patients admitted to the hospital, checkout time is 11:00 AM the day of discharge.   Patients discharged the day of surgery will not be allowed to drive home.  Name and phone number of your driver: NA  Special Instructions: CHG Shower Use Special Wash: 1/2 bottle night before surgery and 1/2 bottle morning of surgery.   Please read over the following fact sheets that you were given: Pain Booklet and Surgical Site Infection Prevention

## 2011-12-07 NOTE — Progress Notes (Signed)
Faxed to [facility]

## 2011-12-07 NOTE — Consult Note (Signed)
Anesthesia:  Patient is a 61 year old male scheduled for total thyroidectomy on 12/10/11 for suspected thyroid CA.  His other history includes DM2, HLD, HTN, hypothyroidism, anemia, former smoker, and non-ischemia CM/CHF/VT s/p ICD.  Dr. Ladona Ridgel has cleared Mike Jimenez for this procedure (see Media tab).  Echo done on 04/14/10 showed:  - Left ventricle: The cavity size was moderately dilated. Systolicfunction was severely reduced. The estimated ejection fraction was in the range of 20% to 25%. Diffuse hypokinesis. - Mitral valve: Mild regurgitation. - Left atrium: The atrium was moderately dilated. - Right ventricle: Systolic function was mildly reduced.  Cardiac cath on 04/12/10 showed left main, LAD, left CX, RAC normal, EF 10-15%.  Non-ischemic CM.  Preoperative CXR showed no active cardiopulmonary disease. Pacemaker/AICD. Mild cardiac enlargement.  EKG shows electronically v-paced rhythm.  Dr. Ladona Ridgel has recommended that the tachy therapies be disabled preoperatively.  The [employer] Rep was notified by one of our PAT nurses.  Labs noted.  Plan to proceed.

## 2011-12-09 MED ORDER — CEFAZOLIN SODIUM-DEXTROSE 2-3 GM-% IV SOLR
2.0000 g | INTRAVENOUS | Status: AC
  Administered 2011-12-10: 2 g via INTRAVENOUS
  Filled 2011-12-09: qty 50

## 2011-12-10 ENCOUNTER — Encounter (HOSPITAL_COMMUNITY): Payer: Self-pay | Admitting: Vascular Surgery

## 2011-12-10 ENCOUNTER — Other Ambulatory Visit (INDEPENDENT_AMBULATORY_CARE_PROVIDER_SITE_OTHER): Payer: Self-pay | Admitting: Surgery

## 2011-12-10 ENCOUNTER — Ambulatory Visit (HOSPITAL_COMMUNITY): Payer: Managed Care, Other (non HMO) | Admitting: Vascular Surgery

## 2011-12-10 ENCOUNTER — Encounter (HOSPITAL_COMMUNITY)
Admission: RE | Disposition: A | Discharge status: Home / Self Care | Payer: Self-pay | Source: Ambulatory Visit | Attending: Surgery

## 2011-12-10 ENCOUNTER — Encounter (HOSPITAL_COMMUNITY): Payer: Self-pay | Admitting: Surgery

## 2011-12-10 ENCOUNTER — Encounter (HOSPITAL_COMMUNITY): Payer: Self-pay | Admitting: *Deleted

## 2011-12-10 ENCOUNTER — Ambulatory Visit (HOSPITAL_COMMUNITY)
Admission: RE | Admit: 2011-12-10 | Discharge: 2011-12-11 | DRG: 627 | Disposition: A | Discharge status: Home / Self Care | Payer: Managed Care, Other (non HMO) | Source: Ambulatory Visit | Attending: Surgery | Admitting: Surgery

## 2011-12-10 DIAGNOSIS — C73 Malignant neoplasm of thyroid gland: Secondary | ICD-10-CM

## 2011-12-10 DIAGNOSIS — E785 Hyperlipidemia, unspecified: Secondary | ICD-10-CM | POA: Insufficient documentation

## 2011-12-10 DIAGNOSIS — I428 Other cardiomyopathies: Secondary | ICD-10-CM | POA: Insufficient documentation

## 2011-12-10 DIAGNOSIS — E041 Nontoxic single thyroid nodule: Secondary | ICD-10-CM

## 2011-12-10 DIAGNOSIS — E039 Hypothyroidism, unspecified: Secondary | ICD-10-CM | POA: Insufficient documentation

## 2011-12-10 DIAGNOSIS — I10 Essential (primary) hypertension: Secondary | ICD-10-CM | POA: Insufficient documentation

## 2011-12-10 DIAGNOSIS — D649 Anemia, unspecified: Secondary | ICD-10-CM | POA: Insufficient documentation

## 2011-12-10 DIAGNOSIS — D34 Benign neoplasm of thyroid gland: Secondary | ICD-10-CM | POA: Insufficient documentation

## 2011-12-10 DIAGNOSIS — E119 Type 2 diabetes mellitus without complications: Secondary | ICD-10-CM | POA: Insufficient documentation

## 2011-12-10 DIAGNOSIS — Z01810 Encounter for preprocedural cardiovascular examination: Secondary | ICD-10-CM | POA: Insufficient documentation

## 2011-12-10 DIAGNOSIS — Z01818 Encounter for other preprocedural examination: Secondary | ICD-10-CM | POA: Insufficient documentation

## 2011-12-10 DIAGNOSIS — Z01812 Encounter for preprocedural laboratory examination: Secondary | ICD-10-CM | POA: Insufficient documentation

## 2011-12-10 HISTORY — PX: THYROIDECTOMY: SHX17

## 2011-12-10 LAB — GLUCOSE, CAPILLARY
Glucose-Capillary: 201 mg/dL — ABNORMAL HIGH (ref 70–99)
Glucose-Capillary: 176 mg/dL — ABNORMAL HIGH (ref 70–99)
Glucose-Capillary: 200 mg/dL — ABNORMAL HIGH (ref 70–99)

## 2011-12-10 LAB — BASIC METABOLIC PANEL WITH GFR
BUN: 18 mg/dL (ref 6–23)
CO2: 29 meq/L (ref 19–32)
Calcium: 9.1 mg/dL (ref 8.4–10.5)
Chloride: 101 meq/L (ref 96–112)
Creatinine, Ser: 1.05 mg/dL (ref 0.50–1.35)
GFR calc Af Amer: 88 mL/min — ABNORMAL LOW (ref >90)
GFR calc non Af Amer: 76 mL/min — ABNORMAL LOW (ref >90)
Glucose, Bld: 224 mg/dL — ABNORMAL HIGH (ref 70–99)
Potassium: 4.7 meq/L (ref 3.5–5.1)
Sodium: 140 meq/L (ref 135–145)

## 2011-12-10 SURGERY — THYROIDECTOMY
Anesthesia: General | Site: Neck | Wound class: Clean

## 2011-12-10 MED ORDER — KCL IN DEXTROSE-NACL 30-5-0.45 MEQ/L-%-% IV SOLN
INTRAVENOUS | Status: DC
  Administered 2011-12-10: 75 mL/h via INTRAVENOUS
  Administered 2011-12-11: 05:00:00 via INTRAVENOUS
  Filled 2011-12-10 (×4): qty 1000

## 2011-12-10 MED ORDER — HYDROMORPHONE HCL PF 1 MG/ML IJ SOLN
INTRAMUSCULAR | Status: AC
  Filled 2011-12-10: qty 1

## 2011-12-10 MED ORDER — GLYCOPYRROLATE 0.2 MG/ML IJ SOLN
INTRAMUSCULAR | Status: DC | PRN
  Administered 2011-12-10: .6 mg via INTRAVENOUS

## 2011-12-10 MED ORDER — 0.9 % SODIUM CHLORIDE (POUR BTL) OPTIME
TOPICAL | Status: DC | PRN
  Administered 2011-12-10: 1000 mL

## 2011-12-10 MED ORDER — CALCIUM CARBONATE 1250 (500 CA) MG PO TABS
2.0000 | ORAL_TABLET | Freq: Three times a day (TID) | ORAL | Status: DC
  Administered 2011-12-10 – 2011-12-11 (×3): 1000 mg via ORAL
  Filled 2011-12-10 (×5): qty 2

## 2011-12-10 MED ORDER — LOSARTAN POTASSIUM 25 MG PO TABS
25.0000 mg | ORAL_TABLET | Freq: Every day | ORAL | Status: DC
  Administered 2011-12-10 – 2011-12-11 (×2): 25 mg via ORAL
  Filled 2011-12-10 (×2): qty 1

## 2011-12-10 MED ORDER — CARVEDILOL 6.25 MG PO TABS
6.2500 mg | ORAL_TABLET | Freq: Two times a day (BID) | ORAL | Status: DC
Start: 2011-12-10 — End: 2011-12-11
  Administered 2011-12-10 – 2011-12-11 (×2): 6.25 mg via ORAL
  Filled 2011-12-10 (×4): qty 1

## 2011-12-10 MED ORDER — HYDROMORPHONE HCL PF 1 MG/ML IJ SOLN
2.0000 mg | INTRAMUSCULAR | Status: DC | PRN
  Administered 2011-12-10: 2 mg via INTRAVENOUS
  Filled 2011-12-10: qty 2
  Filled 2011-12-10: qty 1

## 2011-12-10 MED ORDER — PROMETHAZINE HCL 25 MG/ML IJ SOLN: 6.2500 mg | INTRAMUSCULAR | Status: DC | PRN

## 2011-12-10 MED ORDER — PROPOFOL 10 MG/ML IV EMUL
INTRAVENOUS | Status: DC | PRN
  Administered 2011-12-10: 150 mg via INTRAVENOUS
  Administered 2011-12-10: 50 mg via INTRAVENOUS

## 2011-12-10 MED ORDER — MEPERIDINE HCL 25 MG/ML IJ SOLN: 6.2500 mg | INTRAMUSCULAR | Status: DC | PRN

## 2011-12-10 MED ORDER — LACTATED RINGERS IV SOLN
INTRAVENOUS | Status: DC
  Administered 2011-12-10: 10:00:00 via INTRAVENOUS

## 2011-12-10 MED ORDER — NEOSTIGMINE METHYLSULFATE 1 MG/ML IJ SOLN
INTRAMUSCULAR | Status: DC | PRN
  Administered 2011-12-10: 4 mg via INTRAVENOUS

## 2011-12-10 MED ORDER — METFORMIN HCL 500 MG PO TABS
1000.0000 mg | ORAL_TABLET | Freq: Two times a day (BID) | ORAL | Status: DC
  Administered 2011-12-10 – 2011-12-11 (×2): 1000 mg via ORAL
  Filled 2011-12-10 (×4): qty 2

## 2011-12-10 MED ORDER — LACTATED RINGERS IV SOLN
INTRAVENOUS | Status: DC | PRN
  Administered 2011-12-10 (×2): via INTRAVENOUS

## 2011-12-10 MED ORDER — PROMETHAZINE HCL 25 MG/ML IJ SOLN
12.5000 mg | Freq: Four times a day (QID) | INTRAMUSCULAR | Status: DC | PRN
  Administered 2011-12-10: 12.5 mg via INTRAVENOUS
  Filled 2011-12-10: qty 1

## 2011-12-10 MED ORDER — FUROSEMIDE 40 MG PO TABS
40.0000 mg | ORAL_TABLET | Freq: Every day | ORAL | Status: DC
  Administered 2011-12-10 – 2011-12-11 (×2): 40 mg via ORAL
  Filled 2011-12-10 (×2): qty 1

## 2011-12-10 MED ORDER — FENTANYL CITRATE 0.05 MG/ML IJ SOLN
INTRAMUSCULAR | Status: DC | PRN
  Administered 2011-12-10 (×3): 50 ug via INTRAVENOUS
  Administered 2011-12-10: 100 ug via INTRAVENOUS

## 2011-12-10 MED ORDER — HYDROMORPHONE HCL PF 1 MG/ML IJ SOLN
0.2500 mg | INTRAMUSCULAR | Status: DC | PRN
  Administered 2011-12-10 (×3): 0.5 mg via INTRAVENOUS

## 2011-12-10 MED ORDER — ACETAMINOPHEN 325 MG PO TABS: 650.0000 mg | ORAL_TABLET | ORAL | Status: DC | PRN

## 2011-12-10 MED ORDER — ONDANSETRON HCL 4 MG/2ML IJ SOLN
INTRAMUSCULAR | Status: DC | PRN
  Administered 2011-12-10: 4 mg via INTRAVENOUS

## 2011-12-10 MED ORDER — HYDROCODONE-ACETAMINOPHEN 5-325 MG PO TABS: 1.0000 | ORAL_TABLET | ORAL | Status: DC | PRN

## 2011-12-10 MED ORDER — MIDAZOLAM HCL 5 MG/5ML IJ SOLN
INTRAMUSCULAR | Status: DC | PRN
  Administered 2011-12-10: 2 mg via INTRAVENOUS

## 2011-12-10 MED ORDER — ALBUTEROL SULFATE HFA 108 (90 BASE) MCG/ACT IN AERS
INHALATION_SPRAY | RESPIRATORY_TRACT | Status: DC | PRN
  Administered 2011-12-10: 2 via RESPIRATORY_TRACT

## 2011-12-10 MED ORDER — ROCURONIUM BROMIDE 100 MG/10ML IV SOLN
INTRAVENOUS | Status: DC | PRN
  Administered 2011-12-10: 10 mg via INTRAVENOUS
  Administered 2011-12-10: 20 mg via INTRAVENOUS
  Administered 2011-12-10: 50 mg via INTRAVENOUS
  Administered 2011-12-10: 20 mg via INTRAVENOUS

## 2011-12-10 MED ORDER — HEMOSTATIC AGENTS (NO CHARGE) OPTIME
TOPICAL | Status: DC | PRN
  Administered 2011-12-10: 1 via TOPICAL

## 2011-12-10 MED ORDER — PHENYLEPHRINE HCL 10 MG/ML IJ SOLN
10.0000 mg | INTRAMUSCULAR | Status: DC | PRN
  Administered 2011-12-10: 10 ug/min via INTRAVENOUS

## 2011-12-10 SURGICAL SUPPLY — 55 items
APL SKNCLS STERI-STRIP NONHPOA (GAUZE/BANDAGES/DRESSINGS)
BENZOIN TINCTURE PRP APPL 2/3 (GAUZE/BANDAGES/DRESSINGS) ×1 IMPLANT
BLADE SURG 10 STRL SS (BLADE) ×2 IMPLANT
BLADE SURG 15 STRL LF DISP TIS (BLADE) ×1 IMPLANT
BLADE SURG 15 STRL SS (BLADE) ×2
BLADE SURG ROTATE 9660 (MISCELLANEOUS) ×2 IMPLANT
CANISTER SUCTION 2500CC (MISCELLANEOUS) ×2 IMPLANT
CHLORAPREP W/TINT 10.5 ML (MISCELLANEOUS) ×2 IMPLANT
CLIP TI MEDIUM 24 (CLIP) ×2 IMPLANT
CLIP TI WIDE RED SMALL 24 (CLIP) ×3 IMPLANT
CLOTH BEACON ORANGE TIMEOUT ST (SAFETY) ×2 IMPLANT
CONT SPEC 4OZ CLIKSEAL STRL BL (MISCELLANEOUS) IMPLANT
COVER SURGICAL LIGHT HANDLE (MISCELLANEOUS) ×2 IMPLANT
CRADLE DONUT ADULT HEAD (MISCELLANEOUS) ×2 IMPLANT
DRAPE PED LAPAROTOMY (DRAPES) ×2 IMPLANT
DRAPE SURG 17X11 SM STRL (DRAPES) ×2 IMPLANT
DRAPE UTILITY 15X26 W/TAPE STR (DRAPE) ×4 IMPLANT
ELECT CAUTERY BLADE 6.4 (BLADE) ×2 IMPLANT
ELECT REM PT RETURN 9FT ADLT (ELECTROSURGICAL) ×2
ELECTRODE REM PT RTRN 9FT ADLT (ELECTROSURGICAL) ×1 IMPLANT
GAUZE SPONGE 4X4 16PLY XRAY LF (GAUZE/BANDAGES/DRESSINGS) ×3 IMPLANT
GLOVE BIO SURGEON STRL SZ7 (GLOVE) ×2 IMPLANT
GLOVE BIO SURGEON STRL SZ8 (GLOVE) ×1 IMPLANT
GLOVE BIOGEL PI IND STRL 6.5 (GLOVE) IMPLANT
GLOVE BIOGEL PI IND STRL 8 (GLOVE) IMPLANT
GLOVE BIOGEL PI INDICATOR 6.5 (GLOVE) ×1
GLOVE BIOGEL PI INDICATOR 8 (GLOVE) ×1
GLOVE SURG ORTHO 8.0 STRL STRW (GLOVE) ×2 IMPLANT
GLOVE SURG SS PI 7.0 STRL IVOR (GLOVE) ×1 IMPLANT
GOWN PREVENTION PLUS XLARGE (GOWN DISPOSABLE) ×1 IMPLANT
GOWN STRL NON-REIN LRG LVL3 (GOWN DISPOSABLE) ×2 IMPLANT
GOWN STRL REIN XL XLG (GOWN DISPOSABLE) ×2 IMPLANT
HEMOSTAT SURGICEL 2X4 FIBR (HEMOSTASIS) ×2 IMPLANT
KIT BASIN OR (CUSTOM PROCEDURE TRAY) ×2 IMPLANT
KIT ROOM TURNOVER OR (KITS) ×2 IMPLANT
NS IRRIG 1000ML POUR BTL (IV SOLUTION) ×2 IMPLANT
PACK SURGICAL SETUP 50X90 (CUSTOM PROCEDURE TRAY) ×2 IMPLANT
PAD ARMBOARD 7.5X6 YLW CONV (MISCELLANEOUS) ×4 IMPLANT
PENCIL BUTTON HOLSTER BLD 10FT (ELECTRODE) ×2 IMPLANT
SHEARS HARMONIC 9CM CVD (BLADE) ×2 IMPLANT
SPECIMEN JAR MEDIUM (MISCELLANEOUS) ×2 IMPLANT
SPONGE GAUZE 4X4 12PLY (GAUZE/BANDAGES/DRESSINGS) ×1 IMPLANT
SPONGE INTESTINAL PEANUT (DISPOSABLE) ×2 IMPLANT
STAPLER VISISTAT 35W (STAPLE) IMPLANT
STRIP CLOSURE SKIN 1/2X4 (GAUZE/BANDAGES/DRESSINGS) ×1 IMPLANT
SUT MNCRL AB 4-0 PS2 18 (SUTURE) ×2 IMPLANT
SUT SILK 2 0 (SUTURE) ×2
SUT SILK 2-0 18XBRD TIE 12 (SUTURE) ×1 IMPLANT
SUT VIC AB 3-0 SH 18 (SUTURE) ×4 IMPLANT
SYR BULB 3OZ (MISCELLANEOUS) ×2 IMPLANT
TAPE CLOTH SURG 4X10 WHT LF (GAUZE/BANDAGES/DRESSINGS) ×1 IMPLANT
TOWEL OR 17X24 6PK STRL BLUE (TOWEL DISPOSABLE) ×2 IMPLANT
TOWEL OR 17X26 10 PK STRL BLUE (TOWEL DISPOSABLE) ×2 IMPLANT
TUBE CONNECTING 12X1/4 (SUCTIONS) ×2 IMPLANT
WATER STERILE IRR 1000ML POUR (IV SOLUTION) IMPLANT

## 2011-12-10 NOTE — Anesthesia Preprocedure Evaluation (Signed)
Anesthesia Evaluation  Patient identified by MRN, date of birth, ID band Patient awake    Reviewed: Allergy & Precautions, H&P , NPO status , Patient's Chart, lab work & pertinent test results, reviewed documented beta blocker date and time   Airway Mallampati: II TM Distance: >3 FB Neck ROM: Full    Dental No notable dental hx. (+) Teeth Intact   Pulmonary asthma ,  clear to auscultation  Pulmonary exam normal       Cardiovascular hypertension, On Home Beta Blockers and On Medications + dysrhythmias Ventricular Tachycardia + pacemaker + Cardiac Defibrillator Regular Normal    Neuro/Psych  Neuromuscular disease Negative Neurological ROS  Negative Psych ROS   GI/Hepatic negative GI ROS, Neg liver ROS,   Endo/Other  Diabetes mellitus-, Poorly Controlled, Type 2, Oral Hypoglycemic Agents  Renal/GU negative Renal ROS  Genitourinary negative   Musculoskeletal   Abdominal (+) obese,   Peds  Hematology negative hematology ROS (+)   Anesthesia Other Findings   Reproductive/Obstetrics negative OB ROS                           Anesthesia Physical Anesthesia Plan  ASA: III  Anesthesia Plan: General   Post-op Pain Management:    Induction: Intravenous  Airway Management Planned: Oral ETT  Additional Equipment:   Intra-op Plan:   Post-operative Plan: Extubation in OR  Informed Consent: I have reviewed the patients History and Physical, chart, labs and discussed the procedure including the risks, benefits and alternatives for the proposed anesthesia with the patient or authorized representative who has indicated his/her understanding and acceptance.     Plan Discussed with: CRNA  Anesthesia Plan Comments:         Anesthesia Quick Evaluation

## 2011-12-10 NOTE — H&P (Signed)
Mike Jimenez is an 61 y.o. male.    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 She was evaluated in September 2012. He was found on physical exam to have a right thyroid nodule. He underwent thyroid ultrasound which showed a small thyroid goiter noted there was a 2.8 cm complex nodule in the right thyroid lobe. There was a 1.4 cm nodule in the left thyroid lobe. Patient underwent ultrasound-guided biopsy of the right thyroid nodule. Cytopathology is suspicious for papillary thyroid carcinoma. Patient is now referred to surgery for thyroidectomy for definitive diagnosis.  Patient has no prior history of thyroid disease other than hypothyroidism. He has been on levothyroxine for over 10 years. He has had no prior head or neck surgery. Patient does note significant hoarseness over the past one to 2 years. Etiology is unknown but was felt to be related to some of his cardiac medications were industrial exposure.  There is a family history of hypothyroidism and the patient's mother. There is no family history of thyroid cancer. There is no family history of other endocrinopathy.   Past Medical History  Diagnosis Date  . Diabetes mellitus   . Hyperlipidemia   . Hypertension   . Hypothyroidism   . Anemia   . Cardiomyopathy     Dr. Ladona Ridgel, h/o VT..s/p biventricular ICD, non-ischemic CM--EF 15%  . Cancer     thyroid    Past Surgical History  Procedure Date  . Ptx     traumatic, age 37 months  . Vasectomy   . Cardiac defibrillator placement 03/2010    Family History  Problem Relation Age of Onset  . Diabetes Maternal Grandfather   . Diabetes Maternal Aunt   . Colon cancer Maternal Aunt   . Lung cancer Paternal Uncle   . Lung cancer Maternal Uncle   . Asthma Sister   . Atrial fibrillation Father    Social History:  reports that he has quit smoking. He does not have any  smokeless tobacco history on file. He reports that he drinks alcohol. He reports that he does not use illicit drugs.  Allergies:  Allergies  Allergen Reactions  . Ramipril     REACTION: cough    Medications Prior to Admission  Medication Dose Route Frequency Provider Last Rate Last Dose  . ceFAZolin (ANCEF) IVPB 2 g/50 mL premix  2 g Intravenous 60 min Pre-Op Velora Heckler, MD       Medications Prior to Admission  Medication Sig Dispense Refill  . aspirin 325 MG tablet Take 325 mg by mouth daily.        Marland Kitchen atorvastatin (LIPITOR) 10 MG tablet Take 1 tablet (10 mg total) by mouth daily.  30 tablet  11  . carvedilol (COREG) 6.25 MG tablet TAKE ONE TABLET BY MOUTH TWICE DAILY WITH MEALS  60 tablet  5  . furosemide (LASIX) 40 MG tablet TAKE ONE TABLET BY MOUTH ONE TIME DAILY  30 tablet  11  . glucose blood test strip 1 each by Other route as needed. [employer], 250.00, check once daily       . Lancet Device MISC by Does not apply route. Wavesense ultra-thin lancets misc, 250.00, check once daily       . levothyroxine (SYNTHROID, LEVOTHROID) 50 MCG tablet TAKE ONE TABLET BY MOUTH ONE TIME DAILY  30 tablet  0  . losartan (COZAAR) 25  MG tablet Take 25 mg by mouth daily.        . metFORMIN (GLUCOPHAGE) 1000 MG tablet Take 1,000 mg by mouth 2 (two) times daily.        . Multiple Vitamin (MULTIVITAMIN) tablet Take 1 tablet by mouth daily.        Marland Kitchen VITAMIN E PO Take by mouth as needed.          Results for orders placed during the hospital encounter of 12/10/11 (from the past 48 hour(s))  GLUCOSE, CAPILLARY     Status: Abnormal   Collection Time   12/10/11  7:17 AM      Component Value Range Comment   Glucose-Capillary 201 (*) 70 - 99 (mg/dL)    No results found.  Review of Systems  Constitutional: Negative.   HENT:       Thyroid nodule on right  Eyes: Negative.   Respiratory: Negative.   Cardiovascular: Negative.   Gastrointestinal: Negative.   Genitourinary:  Negative.   Musculoskeletal: Negative.   Skin: Negative.   Neurological: Negative.   Endo/Heme/Allergies: Negative.   Psychiatric/Behavioral: Negative.     Blood pressure 157/84, pulse 73, temperature 98 F (36.7 C), temperature source Oral, resp. rate 20, SpO2 97.00%. Physical Exam  Constitutional: He is oriented to person, place, and time. He appears well-developed and well-nourished.  HENT:  Head: Normocephalic and atraumatic.  Right Ear: External ear normal.  Left Ear: External ear normal.  Nose: Nose normal.  Mouth/Throat: Oropharynx is clear and moist.  Eyes: Conjunctivae and EOM are normal. Pupils are equal, round, and reactive to light.  Neck: Normal range of motion. Thyromegaly present.  Cardiovascular: Normal rate, normal heart sounds and intact distal pulses.   Respiratory: Effort normal and breath sounds normal.  GI: Soft. Bowel sounds are normal.  Musculoskeletal: Normal range of motion.  Neurological: He is alert and oriented to person, place, and time. He has normal reflexes.  Skin: Skin is warm and dry.  Psychiatric: He has a normal mood and affect. His behavior is normal. Judgment and thought content normal.     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  Plan total thyroidectomy with limited lymph node dissection.  The risks and benefits of the procedure have been discussed at length with the patient.  The patient understands the proposed procedure, potential alternative treatments, and the course of recovery to be expected.  All of the patient's questions have been answered at this time.  The patient wishes to proceed with surgery and will schedule a date for their procedure through our office staff.  Velora Heckler, MD, FACS General & Endocrine Surgery [facility]  Harue Pribble M 12/10/2011, 9:18 AM

## 2011-12-10 NOTE — Preoperative (Signed)
Beta Blockers   Reason not to administer Beta Blockers:Not Applicable 

## 2011-12-10 NOTE — Progress Notes (Signed)
Dr Gerrit Friends called to let [facility] cards know that the patient had come through surgery well and asked that we be on board in case there were any cardiac issues. His ICD was turned off before surgery and turned back on afterward. His PM labs showed no new abnormalities and his calcium is within normal limits. Please call us PRN.

## 2011-12-10 NOTE — Anesthesia Procedure Notes (Signed)
Procedure Name: Intubation Date/Time: 12/10/2011 10:03 AM Performed by: Elizbeth Squires Pre-anesthesia Checklist: Patient identified, Suction available, Emergency Drugs available and Patient being monitored Patient Re-evaluated:Patient Re-evaluated prior to inductionOxygen Delivery Method: Circle System Utilized Preoxygenation: Pre-oxygenation with 100% oxygen Intubation Type: IV induction Ventilation: Two handed mask ventilation required and Oral airway inserted - appropriate to patient size Laryngoscope Size: Mac and 4 Grade View: Grade I Tube type: Oral Tube size: 8.0 mm Number of attempts: 1 Airway Equipment and Method: stylet Placement Confirmation: ETT inserted through vocal cords under direct vision,  positive ETCO2 and breath sounds checked- equal and bilateral Secured at: 24 cm Tube secured with: Tape Dental Injury: Teeth and Oropharynx as per pre-operative assessment

## 2011-12-10 NOTE — Transfer of Care (Signed)
Immediate Anesthesia Transfer of Care Note  Patient: Mike Jimenez  Procedure(s) Performed:  THYROIDECTOMY - Total thyroidectomy with limited central compartment lymph node dissection.  Patient Location: PACU  Anesthesia Type: General  Level of Consciousness: awake, alert  and oriented  Airway & Oxygen Therapy: Patient Spontanous Breathing and Patient connected to nasal cannula oxygen  Post-op Assessment: Report given to PACU RN, Post -op Vital signs reviewed and stable, Patient moving all extremities and Patient able to stick tongue midline  Post vital signs: Reviewed and stable Filed Vitals:   12/10/11 0713  BP: 157/84  Pulse: 73  Temp: 36.7 C  Resp: 20    Complications: No apparent anesthesia complications

## 2011-12-10 NOTE — Progress Notes (Signed)
AICD   Back on by Bobbe Medico, [employer]tr[employer].

## 2011-12-10 NOTE — Anesthesia Postprocedure Evaluation (Signed)
  Anesthesia Post-op Note  Patient: Mike Jimenez  Procedure(s) Performed:  THYROIDECTOMY - Total thyroidectomy with limited central compartment lymph node dissection.  Patient Location: PACU  Anesthesia Type: General  Level of Consciousness: awake  Airway and Oxygen Therapy: Patient Spontanous Breathing and Patient connected to nasal cannula oxygen  Post-op Pain: mild  Post-op Assessment: Post-op Vital signs reviewed, Patient's Cardiovascular Status Stable, Respiratory Function Stable and Patent Airway  Post-op Vital Signs: Reviewed and stable  Complications: No apparent anesthesia complications

## 2011-12-10 NOTE — Brief Op Note (Signed)
12/10/2011  12:00 PM  PATIENT:  Mike Jimenez  61 y.o. male  PRE-OPERATIVE DIAGNOSIS:  Suspected thyroid cancer, bilateral thyroid nodules.  POST-OPERATIVE DIAGNOSIS:  Suspected thyroid cancer, bilateral thyroid nodules.  PROCEDURE:  1.  Total Thyroidectomy  2.  Limited central compartment lymph node dissection  SURGEON:  Surgeon(s): Velora Heckler, MD, FACS  ASSISTANTS: none   ANESTHESIA:   general  EBL:  Total I/O In: 1050 [I.V.:1050] Out: 20 [Blood:20]  BLOOD ADMINISTERED:none  DRAINS: none   LOCAL MEDICATIONS USED:  MARCAINE - NONE  SPECIMEN:  Excision  DISPOSITION OF SPECIMEN:  PATHOLOGY  COUNTS:  YES  TOURNIQUET:  * No tourniquets in log *  DICTATION: .Other Dictation: Dictation Number [id number]  PLAN OF CARE: Admit for overnight observation  PATIENT DISPOSITION:  PACU - hemodynamically stable.   Velora Heckler, MD, FACS General & Endocrine Surgery [facility]

## 2011-12-10 NOTE — Progress Notes (Signed)
NOTIFIED MARCIA [employer] REP OF PATIENT'S SURGERY 930 AM TODAY .

## 2011-12-10 NOTE — Progress Notes (Signed)
Utilization review completed. Stokes Rattigan Diane1/09/2012  

## 2011-12-11 LAB — BASIC METABOLIC PANEL
BUN: 15 mg/dL (ref 6–23)
Chloride: 99 mEq/L (ref 96–112)
Creatinine, Ser: 1.13 mg/dL (ref 0.50–1.35)
GFR calc Af Amer: 80 mL/min — ABNORMAL LOW (ref >90)
Glucose, Bld: 220 mg/dL — ABNORMAL HIGH (ref 70–99)

## 2011-12-11 MED ORDER — HYDROCODONE-ACETAMINOPHEN 5-325 MG PO TABS: 1.0000 | ORAL_TABLET | Freq: Four times a day (QID) | ORAL | Status: AC | PRN

## 2011-12-11 MED ORDER — CALCIUM CARBONATE 1250 (500 CA) MG PO TABS: 2.0000 | ORAL_TABLET | Freq: Three times a day (TID) | ORAL | Status: DC

## 2011-12-11 NOTE — Op Note (Signed)
NAMELILTON, PARE NO.:  [id number]  MEDICAL RECORD NO.:  [id number]  LOCATION:  5151                         FACILITY:  [facility]  PHYSICIAN:  Velora Heckler, MD      DATE OF BIRTH:  Sep 19, 1951  DATE OF PROCEDURE:  12/10/2011                              OPERATIVE REPORT   PREOPERATIVE DIAGNOSIS:  Bilateral thyroid nodules, suspicion of papillary thyroid carcinoma.  POSTOP DIAGNOSIS:  Bilateral thyroid nodules, suspicion of papillary thyroid carcinoma.  PROCEDURE: 1. Total thyroidectomy. 2. Limited central compartment lymph node dissection.  SURGEON:  Velora Heckler, MD, FACS  ANESTHESIA:  General.  ESTIMATED BLOOD LOSS:  Minimal.  PREPARATION:  ChloraPrep.  COMPLICATIONS:  None.  INDICATIONS:  The patient is a 61 year old, white male, who was found on physical examination by his primary care physician, to have a right- sided neck mass.  Ultrasound demonstrated a 2.8-cm mass in the right thyroid lobe.  Bilateral nodules were noted.  Ultrasound-guided fine- needle aspiration biopsy of the right thyroid nodule suspicious for papillary thyroid carcinoma.  The patient was referred to General Surgery and prepared for the operating room.  BODY OF REPORT:  Procedure was done in OR #2 at the [facility].  The patient was brought to the operating room, placed in supine position on the operating room table.  Following administration of general anesthesia, the patient was positioned and then prepped and draped in the usual strict aseptic fashion.  After ascertaining that an adequate level of anesthesia had been achieved, a Kocher incision was made with a #15 blade.  Dissection was carried through subcutaneous tissues and platysma.  Hemostasis was obtained with the electrocautery.  Skin flaps were elevated cephalad and caudad from the thyroid notch to the sternal notch.  A Mahorner self-retaining retractor was placed for exposure.  Strap  muscles were incised in the midline.  Dissection was begun on the right side of the neck.  Right strap muscles were reflected laterally.  Right lobe was exposed. It was gently mobilized and dissected out.  Superior pole vessels were dissected out and divided between medium Ligaclips with the Harmonic Scalpel.  Gland was rolled anteriorly.  Branches of the inferior thyroid artery are divided between small and medium Ligaclips.  Care was taken to preserve the parathyroid glands and the recurrent laryngeal nerve. The inferior venous tributaries were divided between medium Ligaclips with the Harmonic Scalpel.  Gland was mobilized anteriorly towards the trachea.  Ligament of Allyson Sabal was released with the electrocautery and the gland was mobilized up and onto the anterior trachea.  There is a small pyramidal lobe which was excised en bloc with the isthmus and resected.  Next, the left strap muscles were reflected laterally exposing the left thyroid lobe.  Again, the gland was gently dissected out with blunt dissection.  Superior pole vessels were divided between small and medium Ligaclips with the Harmonic Scalpel.  Parathyroid tissue was identified and preserved.  Inferior venous tributaries were divided between medium Ligaclips with the Harmonic Scalpel.  Gland was rolled anteriorly. Branches of the inferior thyroid artery were divided between small Ligaclips with the Harmonic Scalpel.  Recurrent  nerve was identified and preserved.  Ligament of Allyson Sabal was released with the electrocautery and the remainder of the isthmus was excised off of the trachea.  The entire thyroid gland was removed.  The sutures used to mark the right superior pole.  The entire gland was submitted to pathology for review.  Next, the central compartment lymph nodes were excised.  There was a prominent fat pad anterior to the trachea.  This was mobilized with the electrocautery.  Vascular structures were divided between  Ligaclips. Tissue was excised in its entirety and submitted separately to pathology, labeled as central compartment lymph nodes.  Neck was irrigated with warm saline.  Good hemostasis was achieved with the electrocautery.  Surgicel was placed throughout the operative field. Strap muscles were reapproximated in the midline with interrupted 3-0 Vicryl sutures.  Platysma was closed with interrupted 3-0 Vicryl sutures.  Skin was closed with a running 4-0 Monocryl subcuticular suture.  Wound was washed and dried and benzoin and Steri-Strips were applied.  Sterile dressings were applied.  The patient was awakened from anesthesia and brought to the recovery room.  The patient tolerated the procedure well.  Velora Heckler, MD, FACS  TMG/MEDQ  D:  12/10/2011  T:  12/11/2011  Job:  [id number]  cc:   Titus Dubin. Alwyn Ren, MD,FACP,FCCP

## 2011-12-11 NOTE — Progress Notes (Signed)
1 Day Post-Op  Subjective: Feels well and wants to go home. Voice seems better than pre-op. No nausea, tol liquid diet,minimal pain  Objective: Vital signs in last 24 hours: Temp:  [97.8 F (36.6 C)-99.2 F (37.3 C)] 98.1 F (36.7 C) (01/12 0616) Pulse Rate:  [60-79] 79  (01/12 0616) Resp:  [10-20] 18  (01/12 0616) BP: (124-176)/(71-106) 124/71 mmHg (01/12 0616) SpO2:  [95 %-99 %] 95 % (01/12 0616) Last BM Date: 12/10/11 (in AM prior to surgery)  Intake/Output from previous day: 01/11 0701 - 01/12 0700 In: 2367.5 [I.V.:2367.5] Out: 920 [Urine:900; Blood:20] Intake/Output this shift:    General appearance: alert and no distress Resp: clear to auscultation bilaterally Incision/Wound: Clean and dry.  Lab Results:  No results found for this basename: WBC:2,HGB:2,HCT:2,PLT:2 in the last 72 hours BMET  Basename 12/11/11 0600 12/10/11 1835  NA 139 140  K 4.0 4.7  CL 99 101  CO2 30 29  GLUCOSE 220* 224*  BUN 15 18  CREATININE 1.13 1.05  CALCIUM 8.9 9.1   PT/INR No results found for this basename: LABPROT:2,INR:2 in the last 72 hours ABG No results found for this basename: PHART:2,PCO2:2,PO2:2,HCO3:2 in the last 72 hours  Studies/Results: No results found.  Anti-infectives: Anti-infectives     Start     Dose/Rate Route Frequency Ordered Stop   12/09/11 1715   ceFAZolin (ANCEF) IVPB 2 g/50 mL premix        2 g 100 mL/hr over 30 Minutes Intravenous 60 min pre-op 12/09/11 1713 12/10/11 1005          Assessment/Plan: s/p Procedure(s): THYROIDECTOMY Discharge He is ready to go. Will keep on current synthroid and send home on calcium  LOS: 1 day    Audon Heymann J 12/11/2011

## 2011-12-11 NOTE — Progress Notes (Signed)
Reviewed all discharge instructions with pt and wife, pt understands what to call MD for and FU appt.  Rx given and reviewed.  Instructed pt to call for numbness, tingling in the extremities or around the mouth.

## 2011-12-13 ENCOUNTER — Encounter (HOSPITAL_COMMUNITY): Payer: Self-pay | Admitting: Surgery

## 2011-12-14 NOTE — Discharge Summary (Signed)
Physician Discharge Summary [facility]  Patient ID: Mike Jimenez MRN: [id number] DOB/AGE: Apr 20, 1951 61 y.o.  Admit date: 12/10/2011 Discharge date: 12/11/11   Admission Diagnoses:  Suspected thyroid carcinoma  Discharge Diagnoses:  Active Problems:  * No active hospital problems. *    Discharged Condition: good  Hospital Course: Patient admitted for observation after total thyroidectomy and limited lymph node dissection.  Remained stable with good pain control.  Calcium level checked post op and stable.  Prepared for discharge on POD#1.  Consults: none  Significant Diagnostic Studies: none  Treatments: surgery: total thyroidectomy with limited lymph node dissection  Discharge Exam: Blood pressure 124/71, pulse 79, temperature 98.1 F (36.7 C), temperature source Oral, resp. rate 18, SpO2 95.00%. HEENT - normal Neck - wound clear and dry; min STS Chest - clear Cor - RRR  Disposition: Home with family  Discharge Orders    Future Appointments: Provider: Department: Dept Phone: Center:   01/06/2012 8:40 AM Mike Lofts Caryl Bis, RN [facility] [phone] [facility]     Future Orders Please Complete By Expires   Diet Carb Modified      Increase activity slowly      Discharge instructions      Comments:   CCS      [facility], [place] [phone]  THYROID/ PARATHYROID SURGERY: POST OP INSTRUCTIONS  Always review your discharge instruction sheet given to you by the facility where your surgery was performed.  IF YOU HAVE DISABILITY OR FAMILY LEAVE FORMS, YOU MUST BRING THEM TO THE OFFICE FOR PROCESSING.  PLEASE DO NOT GIVE THEM TO YOUR DOCTOR.  A prescription for pain medication may be given to you upon discharge.  Take your pain medication as prescribed, if needed.  If narcotic pain medicine is not needed, then you may take acetaminophen (Tylenol) or ibuprofen (Advil) as needed. Take your usually prescribed medications unless otherwise  directed. If you need a refill on your pain medication, please contact your pharmacy. They will contact our office to request authorization.  Prescriptions will not be filled after 5pm or on week-ends. You should follow a light diet the first 24 hours after arrival home, such as soup and crackers, etc.  Be sure to include lots of fluids daily.  Resume your normal diet the day after surgery. Most patients will experience some swelling and bruising on the chest and neck area.  Ice packs will help.  Swelling and bruising can take several days to resolve.  It is common to experience some constipation if taking pain medication after surgery.  Increasing fluid intake and taking a stool softener will usually help or prevent this problem from occurring.  A mild laxative (Milk of Magnesia or Miralax) should be taken according to package directions if there are no bowel movements after 48 hours. Unless discharge instructions indicate otherwise, you may remove your bandages 24-48 hours after surgery, and you may shower at that time.  You may have steri-strips (small skin tapes) in place directly over the incision.  These strips should be left on the skin for 7-10 days.  If your surgeon used skin glue on the incision, you may shower in 24 hours.  The glue will flake off over the next 2-3 weeks.  Any sutures or staples will be removed at the office during your follow-up visit. ACTIVITIES:  You may resume regular (light) daily activities beginning the next day-such as daily self-care, walking, climbing stairs-gradually increasing activities as tolerated.  You may have  sexual intercourse when it is comfortable.  Refrain from any heavy lifting or straining until approved by your doctor. You may drive when you no longer are taking prescription pain medication, you can comfortably wear a seatbelt, and you can safely maneuver your car and apply brakes RETURN TO WORK:   __________________________________________________________ Mike Jimenez should see your doctor in the office for a follow-up appointment approximately two weeks after your surgery.  Make sure that you call for this appointment within a day or two after you arrive home to insure a convenient appointment time. OTHER INSTRUCTIONS: ____________________________________________________________________________ _________________________________________________________________________________________________________________ _________________________________________________________________________________________________________________   WHEN TO CALL YOUR DOCTOR: Fever over 101.0 Inability to urinate Nausea and/or vomiting Extreme swelling or bruising Continued bleeding from incision. Increased pain, redness, or drainage from the incision. Difficulty swallowing or breathing Muscle cramping or spasms. Numbness or tingling in hands or feet or around lips.  The clinic staff is available to answer your questions during regular business hours.  Please don't hesitate to call and ask to speak to one of the nurses if you have concerns.  For further questions, please visit [email or web address]    No dressing needed        Discharge Medication List as of 12/11/2011  9:35 AM    START taking these medications   Details  calcium carbonate (OS-CAL - DOSED IN MG OF ELEMENTAL CALCIUM) 1250 MG tablet Take 2 tablets (1,000 mg of elemental calcium total) by mouth 3 (three) times daily., Starting 12/11/2011, Until Sun 12/10/12, Normal    HYDROcodone-acetaminophen (NORCO) 5-325 MG per tablet Take 1 tablet by mouth every 6 (six) hours as needed for pain., Starting 12/11/2011, Until Tue 12/21/11, Print      CONTINUE these medications which have NOT CHANGED   Details  aspirin 325 MG tablet Take 325 mg by mouth daily.  , Until Discontinued, Historical Med    atorvastatin (LIPITOR) 10 MG tablet Take 1 tablet (10 mg  total) by mouth daily., Starting 04/01/2011, Until Fri 03/31/12, Normal    carvedilol (COREG) 6.25 MG tablet TAKE ONE TABLET BY MOUTH TWICE DAILY WITH MEALS, Normal    ferrous sulfate 325 (65 FE) MG tablet TAKE ONE TABLET BY MOUTH TWICE DAILY, Normal    furosemide (LASIX) 40 MG tablet TAKE ONE TABLET BY MOUTH ONE TIME DAILY, Normal    glucose blood test strip 1 each by Other route as needed. [employer], 250.00, check once daily , Historical Med    Lancet Device MISC by Does not apply route. Wavesense ultra-thin lancets misc, 250.00, check once daily , Until Discontinued, Historical Med    levothyroxine (SYNTHROID, LEVOTHROID) 50 MCG tablet TAKE ONE TABLET BY MOUTH ONE TIME DAILY, Normal    losartan (COZAAR) 25 MG tablet Take 25 mg by mouth daily.  , Until Discontinued, Historical Med    metFORMIN (GLUCOPHAGE) 1000 MG tablet Take 1,000 mg by mouth 2 (two) times daily.  , Until Discontinued, Historical Med    Multiple Vitamin (MULTIVITAMIN) tablet Take 1 tablet by mouth daily.  , Until Discontinued, Historical Med    niacin 100 MG tablet Take 100 mg by mouth daily with breakfast.  , Until Discontinued, Historical Med    omega-3 acid ethyl esters (LOVAZA) 1 G capsule Take 1 g by mouth 2 (two) times daily.  , Until Discontinued, Historical Med    POTASSIUM GLUCONATE PO Take 1 tablet by mouth daily.  , Until Discontinued, Historical Med    VITAMIN E PO Take by mouth as needed.  ,  Until Discontinued, Historical Med       Velora Heckler, MD, FACS General & Endocrine Surgery [facility]   Signed: Velora Heckler 12/14/2011, 11:46 AM

## 2011-12-15 ENCOUNTER — Encounter: Payer: Self-pay | Admitting: Internal Medicine

## 2011-12-21 ENCOUNTER — Other Ambulatory Visit: Payer: Self-pay | Admitting: Internal Medicine

## 2011-12-22 ENCOUNTER — Other Ambulatory Visit: Payer: Self-pay

## 2011-12-22 MED ORDER — LOSARTAN POTASSIUM 25 MG PO TABS: 25.0000 mg | ORAL_TABLET | Freq: Every day | ORAL | Status: DC

## 2011-12-23 ENCOUNTER — Other Ambulatory Visit (INDEPENDENT_AMBULATORY_CARE_PROVIDER_SITE_OTHER): Payer: Self-pay | Admitting: Surgery

## 2011-12-23 DIAGNOSIS — C73 Malignant neoplasm of thyroid gland: Secondary | ICD-10-CM

## 2011-12-23 NOTE — Progress Notes (Signed)
Office note, path, and op note faxed to Dr. Daune Perch office 12/23/2011. Message left on patient's voicemail.

## 2012-01-01 ENCOUNTER — Other Ambulatory Visit: Payer: Self-pay | Admitting: Internal Medicine

## 2012-01-03 NOTE — Telephone Encounter (Signed)
Patient is due for A1c 250.00, TSH 244.9

## 2012-01-05 ENCOUNTER — Telehealth: Payer: Self-pay | Admitting: Internal Medicine

## 2012-01-05 NOTE — Telephone Encounter (Signed)
Discuss with patient  

## 2012-01-05 NOTE — Telephone Encounter (Signed)
Patient knows that he is due for labs for A1c and TSH. He states that he had thyroid surgery last month and will be starting chemo soon. He would like to know if chemo will affect the results of his labs? Also, he wants to know if he should have labs done since he had thyroid sx?

## 2012-01-05 NOTE — Telephone Encounter (Signed)
When the endocrinologist checks his TSH;he can  also check an A1c.

## 2012-01-06 ENCOUNTER — Encounter: Payer: Self-pay | Admitting: Internal Medicine

## 2012-01-06 ENCOUNTER — Ambulatory Visit (INDEPENDENT_AMBULATORY_CARE_PROVIDER_SITE_OTHER): Payer: Managed Care, Other (non HMO) | Admitting: *Deleted

## 2012-01-06 DIAGNOSIS — I509 Heart failure, unspecified: Secondary | ICD-10-CM

## 2012-01-06 DIAGNOSIS — I472 Ventricular tachycardia: Secondary | ICD-10-CM

## 2012-01-06 DIAGNOSIS — Z95810 Presence of automatic (implantable) cardiac defibrillator: Secondary | ICD-10-CM

## 2012-01-06 DIAGNOSIS — I428 Other cardiomyopathies: Secondary | ICD-10-CM

## 2012-01-07 LAB — REMOTE ICD DEVICE
AL AMPLITUDE: 2.6 mv
BAMS-0001: 170 {beats}/min
BATTERY VOLTAGE: 3.1313 V
BRDY-0002LV: 50 {beats}/min
FVT: 0
LV LEAD IMPEDENCE ICD: 836 Ohm
LV LEAD THRESHOLD: 1 V
RV LEAD AMPLITUDE: 13 mv
RV LEAD IMPEDENCE ICD: 494 Ohm
TOT-0006: 20110516000000
TZAT-0001SLOWVT: 1
TZAT-0001SLOWVT: 2
TZAT-0002ATACH: NEGATIVE
TZAT-0002ATACH: NEGATIVE
TZAT-0005FASTVT: 88 pct
TZAT-0005FASTVT: 91 pct
TZAT-0011FASTVT: 10 ms
TZAT-0011FASTVT: 10 ms
TZAT-0011SLOWVT: 10 ms
TZAT-0011SLOWVT: 10 ms
TZAT-0012FASTVT: 200 ms
TZAT-0012FASTVT: 200 ms
TZAT-0018ATACH: NEGATIVE
TZAT-0018FASTVT: NEGATIVE
TZAT-0018FASTVT: NEGATIVE
TZAT-0018SLOWVT: NEGATIVE
TZAT-0018SLOWVT: NEGATIVE
TZAT-0019ATACH: 6 V
TZAT-0019ATACH: 6 V
TZAT-0019FASTVT: 8 V
TZAT-0019FASTVT: 8 V
TZAT-0019SLOWVT: 8 V
TZAT-0019SLOWVT: 8 V
TZAT-0020ATACH: 1.5 ms
TZAT-0020SLOWVT: 1.5 ms
TZAT-0020SLOWVT: 1.5 ms
TZON-0003FASTVT: 250 ms
TZON-0003VSLOWVT: 450 ms
TZON-0005SLOWVT: 12
TZST-0001ATACH: 5
TZST-0001ATACH: 6
TZST-0001FASTVT: 4
TZST-0001FASTVT: 6
TZST-0001SLOWVT: 4
TZST-0001SLOWVT: 5
TZST-0002ATACH: NEGATIVE
TZST-0002ATACH: NEGATIVE
TZST-0003FASTVT: 35 J
TZST-0003FASTVT: 35 J
TZST-0003FASTVT: 35 J
TZST-0003SLOWVT: 35 J
TZST-0003SLOWVT: 35 J
VF: 0

## 2012-01-10 ENCOUNTER — Other Ambulatory Visit: Payer: Self-pay | Admitting: Internal Medicine

## 2012-01-10 DIAGNOSIS — C73 Malignant neoplasm of thyroid gland: Secondary | ICD-10-CM

## 2012-01-14 NOTE — Progress Notes (Signed)
ICD remote 

## 2012-01-20 ENCOUNTER — Encounter (HOSPITAL_COMMUNITY)
Admission: RE | Admit: 2012-01-20 | Discharge: 2012-01-20 | Disposition: A | Discharge status: Home / Self Care | Payer: Managed Care, Other (non HMO) | Source: Ambulatory Visit | Attending: Internal Medicine | Admitting: Internal Medicine

## 2012-01-20 DIAGNOSIS — C73 Malignant neoplasm of thyroid gland: Secondary | ICD-10-CM

## 2012-01-20 MED ORDER — SODIUM IODIDE I 131 CAPSULE
104.1000 | Freq: Once | INTRAVENOUS | Status: AC | PRN
  Administered 2012-01-20: 104.1 via ORAL

## 2012-01-26 ENCOUNTER — Encounter: Payer: Self-pay | Admitting: *Deleted

## 2012-01-31 ENCOUNTER — Ambulatory Visit (HOSPITAL_COMMUNITY)
Admission: RE | Admit: 2012-01-31 | Discharge: 2012-01-31 | Disposition: A | Discharge status: Home / Self Care | Payer: Managed Care, Other (non HMO) | Source: Ambulatory Visit | Attending: Internal Medicine | Admitting: Internal Medicine

## 2012-01-31 ENCOUNTER — Encounter (INDEPENDENT_AMBULATORY_CARE_PROVIDER_SITE_OTHER): Payer: Self-pay | Admitting: Surgery

## 2012-01-31 ENCOUNTER — Ambulatory Visit (INDEPENDENT_AMBULATORY_CARE_PROVIDER_SITE_OTHER): Payer: Managed Care, Other (non HMO) | Admitting: Surgery

## 2012-01-31 VITALS — BP 136/80 | HR 70 | Temp 97.9°F | Resp 18 | Ht 75.0 in | Wt 283.6 lb

## 2012-01-31 DIAGNOSIS — C73 Malignant neoplasm of thyroid gland: Secondary | ICD-10-CM

## 2012-01-31 DIAGNOSIS — Z85850 Personal history of malignant neoplasm of thyroid: Secondary | ICD-10-CM | POA: Insufficient documentation

## 2012-01-31 NOTE — Progress Notes (Signed)
Visit Diagnoses: 1. Thyroid cancer, multifocal papillary carcinoma     HISTORY: Patient returns for postoperative visit having undergone total thyroidectomy with limited lymph node dissection. Patient has seen his endocrinologist. He underwent total body iodine scan this morning. This shows no residual activity in the thyroid bed and no sign of metastatic disease. The patient is suffering from hypothyroidism. He is just beginning to take thyroid hormone replacement. He feels very fatigued.  EXAM: Surgical wound is healing nicely. Good cosmetic results. No palpable nodules. Voice quality is slightly hoarse.  IMPRESSION: Papillary thyroid carcinoma, status post total thyroidectomy with limited lymph node dissection, recent completion of radioactive iodine treatment  PLAN: Patient will follow up with Dr. Sharl Ma. Hopefully his dose of Synthroid will be increased in the near future. Patient will return to see me for schedule followup in 6 months. He will continue to apply topical creams to his incision.  Velora Heckler, MD, FACS General & Endocrine Surgery [facility]

## 2012-01-31 NOTE — Patient Instructions (Signed)
  COCOA BUTTER & VITAMIN E CREAM  ([employer] or other brand)  Apply cocoa butter/vitamin E cream to your incision 2 - 3 times daily.  Massage cream into incision for one minute with each application.  Use sunscreen (50 SPF or higher) for first 6 months after surgery if area is exposed to sun.  You may substitute Mederma or other scar reducing creams as desired.   

## 2012-02-03 ENCOUNTER — Telehealth: Payer: Self-pay | Admitting: Internal Medicine

## 2012-02-03 NOTE — Telephone Encounter (Signed)
Spoke with patient, called to inform patient he is due for A1c check. Patient mentioned he had A1c checked at another Dr.'s office in 12/2011. Patient mentioned the value was over 7. Scheduled appointment for patient to come in and discuss DM with Dr.Hopper. Patient instructed to bring a copy of labs to pending appointment

## 2012-02-03 NOTE — Telephone Encounter (Signed)
MetFORMIN HCl (Tab) GLUCOPHAGE 1000 MG TAKE ONE TABLET BY MOUTH TWICE DAILY

## 2012-02-10 ENCOUNTER — Encounter: Payer: Self-pay | Admitting: Internal Medicine

## 2012-02-10 ENCOUNTER — Ambulatory Visit (INDEPENDENT_AMBULATORY_CARE_PROVIDER_SITE_OTHER): Payer: Managed Care, Other (non HMO) | Admitting: Internal Medicine

## 2012-02-10 VITALS — BP 118/76 | HR 80 | Temp 98.4°F | Wt 291.8 lb

## 2012-02-10 DIAGNOSIS — D509 Iron deficiency anemia, unspecified: Secondary | ICD-10-CM

## 2012-02-10 DIAGNOSIS — C73 Malignant neoplasm of thyroid gland: Secondary | ICD-10-CM

## 2012-02-10 DIAGNOSIS — E119 Type 2 diabetes mellitus without complications: Secondary | ICD-10-CM

## 2012-02-10 LAB — CBC WITH DIFFERENTIAL/PLATELET
Eosinophils Absolute: 0.2 10*3/uL (ref 0.0–0.7)
MCHC: 33.8 g/dL (ref 30.0–36.0)
MCV: 92.6 fl (ref 78.0–100.0)
Monocytes Absolute: 0.4 10*3/uL (ref 0.1–1.0)
Neutrophils Relative %: 70 % (ref 43.0–77.0)
Platelets: 198 10*3/uL (ref 150.0–400.0)
RDW: 14.3 % (ref 11.5–14.6)

## 2012-02-10 LAB — IBC PANEL
Iron: 93 ug/dL (ref 42–165)
Transferrin: 277.8 mg/dL (ref 212.0–360.0)

## 2012-02-10 NOTE — Assessment & Plan Note (Signed)
A1 C. was greater than 7 on 12/31/11 @ Dr Daune Perch office. No adjustments made. This was in the context of profound hypothyroidism with TSH greater than 44. Until the TSH can be suppressed adequately over several months, no aggressive changes should be made. A1c will be checked today. Nutritional intervention will be stressed.

## 2012-02-10 NOTE — Assessment & Plan Note (Signed)
Monitored by Dr. Sharl Ma, endocrinologist

## 2012-02-10 NOTE — Assessment & Plan Note (Signed)
Colonoscopy was negative last year. CBC and iron  panel will be checked

## 2012-02-10 NOTE — Progress Notes (Signed)
Subjective:    Patient ID: Mike Jimenez, male    DOB: 12/27/1950, 61 y.o.   MRN: [id number]  HPI Diabetes status assessment: Fasting or morning glucose range:  130-140 in past 6 weeks  Highest glucose 2 hours after any meal:  <170. Hypoglycemia :  no .                                                     Excess thirst :no;  Excess hunger:  no ;  Excess urination:  Only from Lasix                                  Lightheadedness with standing: only after RAI short term Chest pain:  no ; Palpitations :no ;  Pain in  calves with walking:  no .                                                                                                                                 Non healing skin  ulcers or sores,especially over the feet: no. Numbness or tingling or burning in feet :no .                                                                                                                                              Significant change in  Weight : see ROS. Vision changes : no                                                                   Exercise :no restarted. Nutrition/diet:  Low carb. Medication compliance : yes. Medication adverse  Effects:  no . Eye exam : last within 6 mos, Dr Sherryle Lis Foot care : no.  A1c/ urine microalbumin monitor: A1c was checked at Dr. Daune Perch office in early February and was over 7. At that time he  was frankly hypothyroid with a TSH greater than 44.    Review of Systems Dr.Kerr's notes were reviewed; he did not address the diabetic status at that visit. The focus was on consideration of RAI following thyroidectomy for papillary thyroid carcinoma.  thyroidectomy on Jan 11th. feb 8th took 131 iodine got "rid of remaining thyroid cells". now levothyoxine 150mg  daily. taking iron currently, not taking calcium now, pt wondering if he should continue the iron and restart the calcium.  after iodine treatment, has gained a lot of weight in short period of time. prior to  thyroidectomy 280 pounds then went to 270 pounds, today 290 pounds.  eating better - increased fruits, stays away from fried food, "hasn't had energy to exercise", gets tired walking short distances but denies shortness of breath.  Pt states he has "increased gas".  He denies abdominal pain, melena, rectal bleeding. He was found to have iron deficiency and anemic 2 years ago      Objective:   Physical Exam Gen.:  well-nourished in appearance. Alert, appropriate and cooperative throughout exam.Mildweight excess   Eyes: No corneal or conjunctival inflammation noted. Mouth: Oral mucosa and oropharynx reveal no lesions or exudates. Teeth in good repair.Slightly hoarse Neck: No deformities, masses, or tenderness noted.  Thyroid absent Lungs: Normal respiratory effort; chest expands symmetrically. Lungs are clear to auscultation without rales, wheezes, or increased work of breathing. Heart: Normal rate and rhythm. Normal S1 and S2. No gallop, click, or rub. S 4 w/o murmur. Abdomen: Bowel sounds normal; abdomen soft and nontender. No masses, organomegaly or hernias noted.Protuberant                                                           Musculoskeletal/extremities:  No clubbing, cyanosis,  or deformity noted. Trace edema. Nail health  good. Vascular: Carotid, radial artery, dorsalis pedis and  posterior tibial pulses are full and equal. No bruits present. Neurologic: Alert and oriented x3. Deep tendon reflexes symmetrical and normal. Light touch normal over feet         Skin: Intact without suspicious lesions or rashes. Lymph: No cervical, axillary lymphadenopathy present. Psych: Mood and affect are normal. Normally interactive                                                                                         Assessment & Plan:

## 2012-02-10 NOTE — Patient Instructions (Addendum)
The most common cause of elevated triglycerides is the ingestion of sugar from high fructose corn syrup sources added to processed foods & drinks.  Eat a low-fat diet with lots of fruits and vegetables, up to 7-9 servings per day. Consume less than 40 (preferably ZERO) grams of sugar per day from foods & drinks with High Fructose Corn Syrup (HFCS) sugar as #1,2,3 or # 4 on label.Whole Foods, [employer] & [employer] do not carry products with HFCS. Follow a  low carb nutrition program such as [place] or The New Sugar Busters  to prevent Diabetes progression . White carbohydrates (potatoes, rice, bread, and pasta) have a high spike of sugar and a high load of sugar. For example a  baked potato has a cup of sugar and a  french fry  2 teaspoons of sugar. Yams, wild  rice, whole grained bread &  wheat pasta have been much lower spike and load of  sugar. Portions should be the size of a deck of cards or your palm.   Please discuss with Dr. Sharl Ma  his managing your diabetes and hypothyroidism until  Issues are stable.

## 2012-03-10 ENCOUNTER — Other Ambulatory Visit: Payer: Self-pay | Admitting: Internal Medicine

## 2012-03-20 ENCOUNTER — Ambulatory Visit (INDEPENDENT_AMBULATORY_CARE_PROVIDER_SITE_OTHER): Payer: BC Managed Care – PPO | Admitting: Internal Medicine

## 2012-03-20 VITALS — BP 140/98 | HR 85 | Temp 98.6°F | Wt 278.0 lb

## 2012-03-20 DIAGNOSIS — M109 Gout, unspecified: Secondary | ICD-10-CM

## 2012-03-20 MED ORDER — COLCHICINE 0.6 MG PO TABS: ORAL_TABLET | ORAL | Status: DC

## 2012-03-20 NOTE — Progress Notes (Signed)
  Subjective:    Patient ID: Mike Jimenez, male    DOB: Jun 11, 1951, 61 y.o.   MRN: [id number]  HPI  He noted swelling of the right ankle after hyperextension injury last week. 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  Swelling started in the left ankle 03/17/12 without any apparent injury. He denies any increased intake of salt.  He denies chest pain, palpitations, dyspnea, paroxysmal nocturnal dyspnea or claudication.  He is not: Amlodipine; he has not taken his carvedilol or losartan today but will pick up a prescription. He has been taking his furosemide   Review of Systems He noted pain beginning @ the base of the left great toe on 03/17/12     Objective:   Physical Exam He appears  well-nourished; he is in no acute distress  No carotid bruits are present.No NVD  Heart rhythm and rate are irregular with no significant murmurs or gallops.  Chest is clear with no increased work of breathing  There is no evidence of aortic aneurysm or renal artery bruits  He has no clubbing ; there is edema of the left foot. No significant edema right foot. Erythema and marked tenderness  at the base of the left great toe compatible with classic podagra   Pedal pulses are intact . Homans sign is negative bilaterally  No ischemic skin changes are present         Assessment & Plan:  #1 edema left foot due to acute gout

## 2012-03-20 NOTE — Patient Instructions (Signed)
Hold the simvastatin until you've completed the colchicine; it could raise the level of the statin.

## 2012-03-21 LAB — URIC ACID: Uric Acid, Serum: 9.8 mg/dL — ABNORMAL HIGH (ref 4.0–7.8)

## 2012-04-28 ENCOUNTER — Other Ambulatory Visit: Payer: Self-pay | Admitting: Internal Medicine

## 2012-05-22 ENCOUNTER — Other Ambulatory Visit: Payer: Self-pay | Admitting: Internal Medicine

## 2012-05-22 NOTE — Telephone Encounter (Signed)
Refill done.  

## 2012-06-13 ENCOUNTER — Ambulatory Visit (INDEPENDENT_AMBULATORY_CARE_PROVIDER_SITE_OTHER): Payer: BC Managed Care – PPO | Admitting: Internal Medicine

## 2012-06-13 ENCOUNTER — Encounter: Payer: Self-pay | Admitting: Internal Medicine

## 2012-06-13 VITALS — BP 132/76 | HR 67 | Ht 75.0 in | Wt 273.0 lb

## 2012-06-13 DIAGNOSIS — I5023 Acute on chronic systolic (congestive) heart failure: Secondary | ICD-10-CM | POA: Insufficient documentation

## 2012-06-13 DIAGNOSIS — I509 Heart failure, unspecified: Secondary | ICD-10-CM

## 2012-06-13 DIAGNOSIS — I499 Cardiac arrhythmia, unspecified: Secondary | ICD-10-CM

## 2012-06-13 DIAGNOSIS — Z95810 Presence of automatic (implantable) cardiac defibrillator: Secondary | ICD-10-CM

## 2012-06-13 DIAGNOSIS — I5022 Chronic systolic (congestive) heart failure: Secondary | ICD-10-CM | POA: Insufficient documentation

## 2012-06-13 DIAGNOSIS — I472 Ventricular tachycardia: Secondary | ICD-10-CM

## 2012-06-13 LAB — ICD DEVICE OBSERVATION
AL AMPLITUDE: 3.6 mv
AL IMPEDENCE ICD: 437 Ohm
ATRIAL PACING ICD: 2.02 pct
CHARGE TIME: 9.599 s
LV LEAD IMPEDENCE ICD: 741 Ohm
RV LEAD IMPEDENCE ICD: 532 Ohm
RV LEAD THRESHOLD: 1.25 V
TOT-0001: 1
TOT-0006: 20110516000000
TZAT-0001ATACH: 2
TZAT-0001FASTVT: 1
TZAT-0001SLOWVT: 1
TZAT-0001SLOWVT: 2
TZAT-0002ATACH: NEGATIVE
TZAT-0002ATACH: NEGATIVE
TZAT-0004SLOWVT: 8
TZAT-0005FASTVT: 91 pct
TZAT-0005SLOWVT: 88 pct
TZAT-0005SLOWVT: 91 pct
TZAT-0011FASTVT: 10 ms
TZAT-0011FASTVT: 10 ms
TZAT-0011SLOWVT: 10 ms
TZAT-0011SLOWVT: 10 ms
TZAT-0012ATACH: 150 ms
TZAT-0013FASTVT: 1
TZAT-0013FASTVT: 2
TZAT-0018ATACH: NEGATIVE
TZAT-0018ATACH: NEGATIVE
TZAT-0018FASTVT: NEGATIVE
TZAT-0018FASTVT: NEGATIVE
TZAT-0018SLOWVT: NEGATIVE
TZAT-0018SLOWVT: NEGATIVE
TZAT-0019ATACH: 6 V
TZAT-0019ATACH: 6 V
TZAT-0019FASTVT: 8 V
TZAT-0019FASTVT: 8 V
TZAT-0019SLOWVT: 8 V
TZAT-0020ATACH: 1.5 ms
TZON-0003FASTVT: 250 ms
TZON-0004VSLOWVT: 20
TZST-0001ATACH: 5
TZST-0001FASTVT: 4
TZST-0001FASTVT: 6
TZST-0001SLOWVT: 3
TZST-0001SLOWVT: 4
TZST-0002ATACH: NEGATIVE
TZST-0003FASTVT: 35 J
TZST-0003FASTVT: 35 J
TZST-0003FASTVT: 35 J
TZST-0003SLOWVT: 35 J
TZST-0003SLOWVT: 35 J
VENTRICULAR PACING ICD: 95.08 pct

## 2012-06-13 NOTE — Patient Instructions (Addendum)
Your physician wants you to follow-up in: 12 months with Dr Court Joy will receive a reminder letter in the mail two months in advance. If you don't receive a letter, please call our office to schedule the follow-up appointment.   Remote monitoring is used to monitor your Pacemaker of ICD from home. This monitoring reduces the number of office visits required to check your device to one time per year. It allows [place] to keep an eye on the functioning of your device to ensure it is working properly. You are scheduled for a device check from home on 09/18/12. You may send your transmission at any time that day. If you have a wireless device, the transmission will be sent automatically. After your physician reviews your transmission, you will receive a postcard with your next transmission date.

## 2012-06-14 ENCOUNTER — Encounter: Payer: Self-pay | Admitting: Internal Medicine

## 2012-06-14 NOTE — Assessment & Plan Note (Signed)
His device is working normally. Will recheck in several months. 

## 2012-06-14 NOTE — Progress Notes (Signed)
HPI Mike Jimenez returns today for followup. He is a pleasant 61 yo man with an ICM, chronic systolic CHF, and VT. He is s/p ICD implant. In the interim, he has had no chest pain or sob or ICD shock. No syncope. Allergies  Allergen Reactions  . Ramipril     REACTION: cough     Current Outpatient Prescriptions  Medication Sig Dispense Refill  . aspirin 325 MG tablet Take 325 mg by mouth daily.        Marland Kitchen atorvastatin (LIPITOR) 20 MG tablet Take 20 mg by mouth daily.      . carvedilol (COREG) 6.25 MG tablet TAKE ONE TABLET BY MOUTH TWICE DAILY WITH MEALS  60 tablet  5  . ferrous sulfate 325 (65 FE) MG tablet TAKE ONE TABLET BY MOUTH TWICE DAILY  60 tablet  5  . furosemide (LASIX) 40 MG tablet TAKE ONE TABLET BY MOUTH ONE TIME DAILY  30 tablet  5  . glucose blood test strip 1 each by Other route as needed. [employer], 250.00, check once daily       . Lancet Device MISC by Does not apply route. Wavesense ultra-thin lancets misc, 250.00, check once daily       . levothyroxine (SYNTHROID, LEVOTHROID) 200 MCG tablet Take 200 mcg by mouth daily.      Marland Kitchen losartan (COZAAR) 25 MG tablet Take 1 tablet (25 mg total) by mouth daily.  30 tablet  7  . metFORMIN (GLUCOPHAGE) 1000 MG tablet TAKE ONE TABLET BY MOUTH TWICE DAILY  60 tablet  0  . Multiple Vitamin (MULTIVITAMIN) tablet Take 1 tablet by mouth daily.        Marland Kitchen omega-3 acid ethyl esters (LOVAZA) 1 G capsule Take 1 g by mouth as needed.       Marland Kitchen CIALIS 10 MG tablet as needed.      Marland Kitchen DISCONTD: calcium carbonate (OS-CAL - DOSED IN MG OF ELEMENTAL CALCIUM) 1250 MG tablet Take 2 tablets (1,000 mg of elemental calcium total) by mouth 3 (three) times daily.  50 tablet  0     Past Medical History  Diagnosis Date  . Diabetes mellitus   . Hyperlipidemia   . Hypertension   . Hypothyroidism   . Anemia   . Cardiomyopathy     Dr. Ladona Ridgel, h/o VT..s/p biventricular ICD, non-ischemic CM--EF 15%  . Cancer     Papillary carcinoma (3 foci) w/o  metstases. Dr Gerrit Friends    ROS:   All systems reviewed and negative except as noted in the HPI.   Past Surgical History  Procedure Date  . Ptx     traumatic, age 55 months  . Vasectomy   . Cardiac defibrillator placement 03/2010  . Thyroidectomy 12/10/2011    Procedure: THYROIDECTOMY;  Surgeon: Velora Heckler, MD;  Location: [facility];  Service: General;  Laterality: N/A;  Total thyroidectomy with limited central compartment lymph node dissection.     Family History  Problem Relation Age of Onset  . Diabetes Maternal Grandfather   . Diabetes Maternal Aunt   . Colon cancer Maternal Aunt   . Lung cancer Paternal Uncle   . Lung cancer Maternal Uncle   . Asthma Sister   . Atrial fibrillation Father      History   Social History  . Marital Status: Married    Spouse Name: N/A    Number of Children: N/A  . Years of Education: N/A   Occupational History  . Not  on file.   Social History Main Topics  . Smoking status: Former Games developer  . Smokeless tobacco: Never Used   Comment: startes at age 17, 2.5 ppd - quit in 1972  . Alcohol Use: Yes  . Drug Use: No  . Sexually Active: Not on file   Other Topics Concern  . Not on file   Social History Narrative  . No narrative on file     BP 132/76  Pulse 67  Ht 6\' 3"  (1.905 m)  Wt 273 lb (123.832 kg)  BMI 34.12 kg/m2  SpO2 99%  Physical Exam:  Well appearing middle aged man, NAD HEENT: Unremarkable Neck:  No JVD, no thyromegally Lungs:  Clear with no wheezes, rales, or rhonchi. Well healed ICD incision. HEART:  Regular rate rhythm, no murmurs, no rubs, no clicks Abd:  soft, positive bowel sounds, no organomegally, no rebound, no guarding Ext:  2 plus pulses, no edema, no cyanosis, no clubbing Skin:  No rashes no nodules Neuro:  CN II through XII intact, motor grossly intact  DEVICE  Normal device function.  See PaceArt for details.   Assess/Plan:

## 2012-06-14 NOTE — Assessment & Plan Note (Signed)
His CHF symptoms are well controlled. He will continue his current meds, and maintain a low sodium diet.

## 2012-06-14 NOTE — Assessment & Plan Note (Signed)
He has had no recurrent symptomatic VT. Will continue current meds.

## 2012-08-01 ENCOUNTER — Other Ambulatory Visit: Payer: Self-pay | Admitting: Internal Medicine

## 2012-08-01 DIAGNOSIS — C73 Malignant neoplasm of thyroid gland: Secondary | ICD-10-CM

## 2012-08-03 ENCOUNTER — Ambulatory Visit
Admission: RE | Admit: 2012-08-03 | Discharge: 2012-08-03 | Disposition: A | Discharge status: Home / Self Care | Payer: BC Managed Care – PPO | Source: Ambulatory Visit | Attending: Internal Medicine | Admitting: Internal Medicine

## 2012-08-03 DIAGNOSIS — C73 Malignant neoplasm of thyroid gland: Secondary | ICD-10-CM

## 2012-09-18 ENCOUNTER — Ambulatory Visit (INDEPENDENT_AMBULATORY_CARE_PROVIDER_SITE_OTHER): Payer: BC Managed Care – PPO | Admitting: *Deleted

## 2012-09-18 ENCOUNTER — Encounter: Payer: Self-pay | Admitting: Internal Medicine

## 2012-09-18 DIAGNOSIS — I472 Ventricular tachycardia: Secondary | ICD-10-CM

## 2012-09-18 DIAGNOSIS — I5022 Chronic systolic (congestive) heart failure: Secondary | ICD-10-CM

## 2012-09-18 DIAGNOSIS — I4729 Other ventricular tachycardia: Secondary | ICD-10-CM

## 2012-09-18 DIAGNOSIS — Z95810 Presence of automatic (implantable) cardiac defibrillator: Secondary | ICD-10-CM

## 2012-09-18 DIAGNOSIS — I509 Heart failure, unspecified: Secondary | ICD-10-CM

## 2012-09-22 LAB — REMOTE ICD DEVICE
AL IMPEDENCE ICD: 418 Ohm
ATRIAL PACING ICD: 1.74 pct
CHARGE TIME: 9.599 s
FVT: 0
PACEART VT: 0
TOT-0001: 1
TOT-0002: 0
TZAT-0002ATACH: NEGATIVE
TZAT-0002ATACH: NEGATIVE
TZAT-0004SLOWVT: 8
TZAT-0004SLOWVT: 8
TZAT-0005SLOWVT: 88 pct
TZAT-0005SLOWVT: 91 pct
TZAT-0011FASTVT: 10 ms
TZAT-0011FASTVT: 10 ms
TZAT-0011SLOWVT: 10 ms
TZAT-0011SLOWVT: 10 ms
TZAT-0012ATACH: 150 ms
TZAT-0012FASTVT: 200 ms
TZAT-0012SLOWVT: 200 ms
TZAT-0012SLOWVT: 200 ms
TZAT-0018FASTVT: NEGATIVE
TZAT-0018FASTVT: NEGATIVE
TZAT-0019ATACH: 6 V
TZAT-0019ATACH: 6 V
TZAT-0019FASTVT: 8 V
TZAT-0019FASTVT: 8 V
TZAT-0019SLOWVT: 8 V
TZAT-0019SLOWVT: 8 V
TZAT-0020ATACH: 1.5 ms
TZAT-0020ATACH: 1.5 ms
TZAT-0020FASTVT: 1.5 ms
TZAT-0020FASTVT: 1.5 ms
TZON-0003ATACH: 350 ms
TZON-0003FASTVT: 250 ms
TZON-0003SLOWVT: 350 ms
TZON-0003VSLOWVT: 450 ms
TZST-0001FASTVT: 4
TZST-0001FASTVT: 5
TZST-0001SLOWVT: 4
TZST-0003FASTVT: 35 J
TZST-0003FASTVT: 35 J
TZST-0003SLOWVT: 35 J
TZST-0003SLOWVT: 35 J
VENTRICULAR PACING ICD: 96.03 pct

## 2012-09-28 ENCOUNTER — Encounter: Payer: Self-pay | Admitting: *Deleted

## 2012-10-15 ENCOUNTER — Other Ambulatory Visit: Payer: Self-pay | Admitting: Internal Medicine

## 2012-11-23 ENCOUNTER — Other Ambulatory Visit: Payer: Self-pay | Admitting: Internal Medicine

## 2012-12-25 ENCOUNTER — Ambulatory Visit (INDEPENDENT_AMBULATORY_CARE_PROVIDER_SITE_OTHER): Payer: BC Managed Care – PPO | Admitting: *Deleted

## 2012-12-25 ENCOUNTER — Encounter: Payer: Self-pay | Admitting: Internal Medicine

## 2012-12-25 DIAGNOSIS — I509 Heart failure, unspecified: Secondary | ICD-10-CM

## 2012-12-25 DIAGNOSIS — I4729 Other ventricular tachycardia: Secondary | ICD-10-CM

## 2012-12-25 DIAGNOSIS — Z95810 Presence of automatic (implantable) cardiac defibrillator: Secondary | ICD-10-CM

## 2012-12-25 DIAGNOSIS — I5022 Chronic systolic (congestive) heart failure: Secondary | ICD-10-CM

## 2012-12-25 DIAGNOSIS — I472 Ventricular tachycardia: Secondary | ICD-10-CM

## 2012-12-25 LAB — REMOTE ICD DEVICE
AL AMPLITUDE: 2.3 mv
BRDY-0002LV: 50 {beats}/min
BRDY-0003LV: 130 {beats}/min
BRDY-0004LV: 120 {beats}/min
FVT: 0
RV LEAD AMPLITUDE: 12.4 mv
RV LEAD IMPEDENCE ICD: 380 Ohm
TOT-0001: 1
TZAT-0001ATACH: 1
TZAT-0001ATACH: 2
TZAT-0001FASTVT: 1
TZAT-0001FASTVT: 2
TZAT-0001SLOWVT: 1
TZAT-0001SLOWVT: 2
TZAT-0002ATACH: NEGATIVE
TZAT-0002ATACH: NEGATIVE
TZAT-0004SLOWVT: 8
TZAT-0004SLOWVT: 8
TZAT-0005SLOWVT: 88 pct
TZAT-0005SLOWVT: 91 pct
TZAT-0011SLOWVT: 10 ms
TZAT-0012ATACH: 150 ms
TZAT-0013FASTVT: 1
TZAT-0013FASTVT: 2
TZAT-0013SLOWVT: 2
TZAT-0013SLOWVT: 2
TZAT-0018ATACH: NEGATIVE
TZAT-0018ATACH: NEGATIVE
TZAT-0018FASTVT: NEGATIVE
TZAT-0018FASTVT: NEGATIVE
TZAT-0019ATACH: 6 V
TZAT-0019FASTVT: 8 V
TZAT-0020FASTVT: 1.5 ms
TZAT-0020FASTVT: 1.5 ms
TZON-0003ATACH: 350 ms
TZON-0003FASTVT: 250 ms
TZON-0003SLOWVT: 350 ms
TZON-0004SLOWVT: 32
TZON-0004VSLOWVT: 20
TZST-0001ATACH: 4
TZST-0001FASTVT: 3
TZST-0001FASTVT: 5
TZST-0001FASTVT: 6
TZST-0001SLOWVT: 3
TZST-0001SLOWVT: 6
TZST-0002ATACH: NEGATIVE
TZST-0002ATACH: NEGATIVE
TZST-0003FASTVT: 35 J
TZST-0003SLOWVT: 35 J
VF: 0

## 2012-12-28 ENCOUNTER — Encounter: Payer: Self-pay | Admitting: *Deleted

## 2013-01-13 ENCOUNTER — Other Ambulatory Visit: Payer: Self-pay

## 2013-02-28 LAB — HM DIABETES EYE EXAM

## 2013-03-02 ENCOUNTER — Other Ambulatory Visit: Payer: Self-pay | Admitting: Internal Medicine

## 2013-03-02 NOTE — Telephone Encounter (Signed)
Schedule CPX 

## 2013-03-26 ENCOUNTER — Other Ambulatory Visit: Payer: Self-pay | Admitting: Internal Medicine

## 2013-03-26 ENCOUNTER — Encounter: Payer: Self-pay | Admitting: Internal Medicine

## 2013-03-26 ENCOUNTER — Ambulatory Visit (INDEPENDENT_AMBULATORY_CARE_PROVIDER_SITE_OTHER): Payer: BC Managed Care – PPO | Admitting: *Deleted

## 2013-03-26 DIAGNOSIS — I509 Heart failure, unspecified: Secondary | ICD-10-CM

## 2013-03-26 DIAGNOSIS — I4729 Other ventricular tachycardia: Secondary | ICD-10-CM

## 2013-03-26 DIAGNOSIS — I5022 Chronic systolic (congestive) heart failure: Secondary | ICD-10-CM

## 2013-03-26 DIAGNOSIS — Z95810 Presence of automatic (implantable) cardiac defibrillator: Secondary | ICD-10-CM

## 2013-03-26 DIAGNOSIS — I472 Ventricular tachycardia: Secondary | ICD-10-CM

## 2013-04-03 LAB — REMOTE ICD DEVICE
AL AMPLITUDE: 2.4 mv
BAMS-0001: 170 {beats}/min
BATTERY VOLTAGE: 3.0567 V
LV LEAD IMPEDENCE ICD: 836 Ohm
LV LEAD THRESHOLD: 1 V
RV LEAD AMPLITUDE: 13.5 mv
TOT-0006: 20110516000000
TZAT-0001ATACH: 1
TZAT-0001ATACH: 2
TZAT-0001ATACH: 3
TZAT-0001SLOWVT: 1
TZAT-0001SLOWVT: 2
TZAT-0002ATACH: NEGATIVE
TZAT-0002ATACH: NEGATIVE
TZAT-0004FASTVT: 8
TZAT-0004FASTVT: 8
TZAT-0005FASTVT: 88 pct
TZAT-0005FASTVT: 91 pct
TZAT-0005SLOWVT: 88 pct
TZAT-0011FASTVT: 10 ms
TZAT-0011FASTVT: 10 ms
TZAT-0012ATACH: 150 ms
TZAT-0013FASTVT: 1
TZAT-0013FASTVT: 2
TZAT-0013SLOWVT: 2
TZAT-0013SLOWVT: 2
TZAT-0018ATACH: NEGATIVE
TZAT-0018ATACH: NEGATIVE
TZAT-0018FASTVT: NEGATIVE
TZAT-0018FASTVT: NEGATIVE
TZAT-0018SLOWVT: NEGATIVE
TZAT-0018SLOWVT: NEGATIVE
TZAT-0019ATACH: 6 V
TZAT-0019ATACH: 6 V
TZAT-0019SLOWVT: 8 V
TZAT-0019SLOWVT: 8 V
TZAT-0020SLOWVT: 1.5 ms
TZAT-0020SLOWVT: 1.5 ms
TZON-0003FASTVT: 250 ms
TZON-0004SLOWVT: 32
TZON-0004VSLOWVT: 20
TZON-0005SLOWVT: 12
TZST-0001ATACH: 5
TZST-0001ATACH: 6
TZST-0001FASTVT: 4
TZST-0001FASTVT: 6
TZST-0001SLOWVT: 3
TZST-0001SLOWVT: 6
TZST-0002ATACH: NEGATIVE
TZST-0002ATACH: NEGATIVE
TZST-0003FASTVT: 35 J
TZST-0003FASTVT: 35 J
TZST-0003SLOWVT: 35 J
TZST-0003SLOWVT: 35 J
VENTRICULAR PACING ICD: 96.78 pct
VF: 0

## 2013-04-19 ENCOUNTER — Encounter: Payer: Self-pay | Admitting: *Deleted

## 2013-04-30 ENCOUNTER — Encounter: Payer: Self-pay | Admitting: Internal Medicine

## 2013-04-30 ENCOUNTER — Ambulatory Visit (INDEPENDENT_AMBULATORY_CARE_PROVIDER_SITE_OTHER): Payer: BC Managed Care – PPO | Admitting: Internal Medicine

## 2013-04-30 ENCOUNTER — Telehealth: Payer: Self-pay | Admitting: Internal Medicine

## 2013-04-30 VITALS — BP 140/84 | HR 72 | Temp 99.2°F | Wt 266.0 lb

## 2013-04-30 DIAGNOSIS — R42 Dizziness and giddiness: Secondary | ICD-10-CM

## 2013-04-30 DIAGNOSIS — J209 Acute bronchitis, unspecified: Secondary | ICD-10-CM

## 2013-04-30 MED ORDER — AZITHROMYCIN 250 MG PO TABS: ORAL_TABLET | ORAL | Status: DC

## 2013-04-30 NOTE — Telephone Encounter (Signed)
Noted as per MD protocol ok to close encounter Pt with pending appt  

## 2013-04-30 NOTE — Progress Notes (Addendum)
  Subjective:    Patient ID: Mike Jimenez, male    DOB: [birth year], 62 y.o.   MRN: [id number]  HPI Chief complaint today is cough however he also felt dizzy today. Was at work, he bent over and felt imbalance, lightheaded. His coworker told him he looked pale and he felt quite fatigued; was also slightly sweaty, sx  self resolved within 2-3 minutes. Since then he is feeling fine except for a brief episode of dizziness. For the last 2 weeks, has been coughing and having chest congestion as well as a yellow sputum production.  Past Medical History  Diagnosis Date  . Diabetes mellitus   . Hyperlipidemia   . Hypertension   . Hypothyroidism   . Anemia   . Cardiomyopathy     Dr. Ladona Ridgel, h/o VT..s/p biventricular ICD, non-ischemic CM--EF 15%  . 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 Dr Gerrit Friends   Past Surgical History  Procedure Laterality Date  . Ptx      traumatic, age 63 months  . Vasectomy    . Cardiac defibrillator placement  03/2010  . Thyroidectomy  12/10/2011    Procedure: THYROIDECTOMY;  Surgeon: Velora Heckler, MD;  Location: [facility] OR;  Service: General;  Laterality: N/A;  Total thyroidectomy with limited central compartment lymph node dissection.   History  Substance Use Topics  . Smoking status: Former Games developer  . Smokeless tobacco: Never Used     Comment: startes at age 20, 2.5 ppd - quit in 1972  . Alcohol Use: Yes    Review of Systems Denies diplopia, facial numbness or slurred speech. No chest pain except occasionally anterior  discomfort when he coughs. No shortness of breath or lower extremity edema. No  palpitations. Mild sinus congestion.    Objective:   Physical Exam BP 140/84  Pulse 72  Temp(Src) 99.2 F (37.3 C) (Oral)  Wt 266 lb (120.657 kg)  BMI 33.25 kg/m2  SpO2 96%  General -- alert, well-developed,NAD.   Neck -- normal carotid pulse  HEENT -- TMs normal, throat w/o redness, face symmetric and not tender to palpation,nose slt congested   Lungs -- normal respiratory effort, no intercostal retractions, no accessory muscle use, and normal breath sounds.   Heart-- regular, no murmur.   Extremities-- no pretibial edema bilaterally  Neurologic-- alert & oriented X3 ; Speech clear, DTRs and  strength symmetric. EOMI-PERLA. Psych-- Cognition and judgment appear intact. Alert and cooperative with normal attention span and concentration.  not anxious appearing and not depressed appearing.      Assessment & Plan:   Bronchitis, see instructions  Had a single episode of dizziness, apparently he was pale and diaphoretic. He is diabetic, is seen by endocrinology, reports his last A1c was 6.0. He also has a history of V. Tach, has a defibrillator which is check routinely   EKG today-at baseline will discuss w/ cards (addendum: asked cards to call if EKG has worrisome features. JP 05-09-13) Although symptoms are likely benign I like him to go to the ER if symptoms continue. I also going to set up a visit with cardiology. Angina equivalent?Marland Kitchen BMP LFTs wnl 02-2013, needs a CBC, will arrange

## 2013-04-30 NOTE — Patient Instructions (Addendum)
Rest, fluids , tylenol For cough, take Mucinex DM twice a day as needed   Take the antibiotic as prescribed  (zithromax) Call if no better in few days Call anytime if the symptoms are severe --- If you have more episodes of dizziness, please go to the ER.

## 2013-04-30 NOTE — Telephone Encounter (Signed)
Patient Information:  Caller Name: Bing  Phone: [phone]  Patient: Mike Jimenez, Mike Jimenez  Gender: Male  DOB: Apr 11, 1951  Age: 62 Years  PCP: Marga Melnick  Office Follow Up:  Does the office need to follow up with this patient?: No  Instructions For The Office: N/A  RN Note:  Mild nausea this morning with. Mild loose stools ended 04/28/13. No appointments remain with Dr Alwyn Ren. Appointment scheduled for 1115 with Dr Drue Novel.   Symptoms  Reason For Call & Symptoms: Left work due to moderate to severe lightheadness which began while bent over and exerting himself.  Deep, productive cough with yellow phlem present for past week.  Chest pain present only when coughs.  FBS not tested; blood sugars ranging in 120's. Chills present; temp not checked. Random BS 181 at 0915; ate at 0630.  Reviewed Health History In EMR: Yes  Reviewed Medications In EMR: Yes  Reviewed Allergies In EMR: Yes  Reviewed Surgeries / Procedures: Yes  Date of Onset of Symptoms: 04/30/2013  Treatments Tried: resting in recliner,  Treatments Tried Worked: Yes  Guideline(s) Used:  Dizziness  Disposition Per Guideline:   Go to Office Now  Reason For Disposition Reached:   Lightheadedness (dizziness) present now, after 2 hours of rest and fluids  Advice Given:  Some Causes of Temporary Dizziness:  Poor Fluid Intake - Not drinking enough fluids and being a little dehydrated is a common cause of temporary dizziness. This is always worse during hot weather.  Standing Up Suddenly - Standing up suddenly (especially getting out of bed) or prolonged standing in one place are common causes of temporary dizziness. Not drinking enough fluids always makes it worse. Certain medications can cause or increase this type of dizziness (e.g., blood pressure medications).  Heat Exposure - Hot weather, hot tubs, or too much sun exposure are common causes of temporary dizziness. Not drinking enough fluids always makes it worse.  Rest for 1-2  Hours:  Lie down with feet elevated for 1 hour. This will improve blood flow and increase blood flow to the brain.  Stand Up Slowly:  If you have to stand up for long periods of time, contract and relax your leg muscles to help pump the blood back to the heart.  Sit down or lie down if you feel dizzy.  Call Back If:  Passes out (faints)  You become worse.  Patient Will Follow Care Advice:  YES  Appointment Scheduled:  04/30/2013 11:15:00 Appointment Scheduled Provider:  Willow Ora

## 2013-05-01 ENCOUNTER — Telehealth: Payer: Self-pay | Admitting: *Deleted

## 2013-05-01 DIAGNOSIS — R42 Dizziness and giddiness: Secondary | ICD-10-CM

## 2013-05-01 NOTE — Telephone Encounter (Signed)
Pt is coming in tomorrow for lab work. Orders entered.

## 2013-05-02 ENCOUNTER — Other Ambulatory Visit (INDEPENDENT_AMBULATORY_CARE_PROVIDER_SITE_OTHER): Payer: BC Managed Care – PPO

## 2013-05-02 DIAGNOSIS — R42 Dizziness and giddiness: Secondary | ICD-10-CM

## 2013-05-02 NOTE — Progress Notes (Signed)
LABS ONLY  

## 2013-05-03 ENCOUNTER — Ambulatory Visit: Payer: BC Managed Care – PPO | Admitting: Cardiology

## 2013-05-03 LAB — CBC WITH DIFFERENTIAL/PLATELET
Basophils Absolute: 0 10*3/uL (ref 0.0–0.1)
Lymphocytes Relative: 40 % (ref 12.0–46.0)
Monocytes Relative: 17.3 % — ABNORMAL HIGH (ref 3.0–12.0)
Neutrophils Relative %: 37.7 % — ABNORMAL LOW (ref 43.0–77.0)
Platelets: 231 10*3/uL (ref 150.0–400.0)
RDW: 13.5 % (ref 11.5–14.6)
WBC: 3.9 10*3/uL — ABNORMAL LOW (ref 4.5–10.5)

## 2013-05-09 ENCOUNTER — Telehealth: Payer: Self-pay | Admitting: *Deleted

## 2013-05-09 NOTE — Telephone Encounter (Signed)
Dr. Drue Novel calling to see if Dr. Ladona Ridgel can review EKG. Patient has pacemaker and c/o dizziness. Patient has upcoming appt on 6/17 with PA, [employer]. Dr. Drue Novel requests Dr. Ladona Ridgel call him back at [phone], if he sees anything notable on EKG. Routed to Dr. Ladona Ridgel and Dennis Bast, RN.

## 2013-05-14 NOTE — Progress Notes (Signed)
ELECTROPHYSIOLOGY OFFICE NOTE  Patient ID: Mike Jimenez MRN: [id number], DOB/AGE: 1951-03-02   Date of Visit: 05/15/2013  Primary Physician: Marga Melnick, MD Primary Cardiologist: Lewayne Bunting, MD Reason for Visit: EP/device followup; dizziness  History of Present Illness  Mike Jimenez is a 62 year old man with an ischemic CM s/p BiV ICD implant 2011, chronic systolic HF and paroxysmal VT who presents today for EP/device follow-up. About one month ago on Memorial Day he experienced an episode of dizziness which was brief and nonexertional. He denies any other symptoms such as CP, SOB or palpitations; however, he was experiencing URI symptoms at the time. He denies fever or chills. His symptoms persisted through the week and he was evaluated by his PCP who diagnosed him with acute bronchitis and prescribed antibiotic therapy. He reports his symptoms improved without recurrence. From a cardiac standpoint, he reports he is doing well. He has no complaints. Today, he denies chest pain or shortness of breath. He denies palpitations, near syncope or syncope. He denies LE swelling, orthopnea, PND or recent weight gain. [name] states that he is compliant and tolerating medications without difficulty.  Past Medical History Past Medical History  Diagnosis Date  . Diabetes mellitus   . Hyperlipidemia   . Hypertension   . Hypothyroidism   . Anemia   . Cardiomyopathy     Dr. Ladona Ridgel, h/o VT..s/p biventricular ICD, non-ischemic CM--EF 15%  . 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 Dr Gerrit Friends    Past Surgical History Past Surgical History  Procedure Laterality Date  . Ptx      traumatic, age 38 months  . Vasectomy    . Cardiac defibrillator placement  03/2010  . Thyroidectomy  12/10/2011    Procedure: THYROIDECTOMY;  Surgeon: Velora Heckler, MD;  Location: [facility] OR;  Service: General;  Laterality: N/A;  Total thyroidectomy with limited central compartment lymph node dissection.      Allergies/Intolerances Allergies  Allergen Reactions  . Ramipril     REACTION: cough   Current Home Medications Current Outpatient Prescriptions  Medication Sig Dispense Refill  . aspirin 325 MG tablet Take 325 mg by mouth daily.        Marland Kitchen atorvastatin (LIPITOR) 20 MG tablet Take 20 mg by mouth daily.      . carvedilol (COREG) 6.25 MG tablet TAKE ONE TABLET BY MOUTH TWICE DAILY WITH MEALS  60 tablet  5  . CIALIS 10 MG tablet as needed.      . furosemide (LASIX) 40 MG tablet APPOINTMENT DUE, 1 by mouth daily  30 tablet  1  . glucose blood test strip 1 each by Other route as needed. [employer], 250.00, check once daily       . Lancet Device MISC by Does not apply route. Wavesense ultra-thin lancets misc, 250.00, check once daily       . levothyroxine (SYNTHROID, LEVOTHROID) 200 MCG tablet Take 224 mcg by mouth daily.       Marland Kitchen losartan (COZAAR) 25 MG tablet TAKE ONE TABLET BY MOUTH ONE TIME DAILY  30 tablet  6  . metFORMIN (GLUCOPHAGE) 1000 MG tablet TAKE ONE TABLET BY MOUTH TWICE DAILY  60 tablet  0  . Multiple Vitamin (MULTIVITAMIN) tablet Take 1 tablet by mouth daily.        Marland Kitchen omega-3 acid ethyl esters (LOVAZA) 1 G capsule Take 1 g by mouth as needed.       Marland Kitchen  Saw Palmetto, Serenoa repens, (SAW PALMETTO PO) Take 1 tablet by mouth daily.      . [DISCONTINUED] calcium carbonate (OS-CAL - DOSED IN MG OF ELEMENTAL CALCIUM) 1250 MG tablet Take 2 tablets (1,000 mg of elemental calcium total) by mouth 3 (three) times daily.  50 tablet  0   No current facility-administered medications for this visit.   Social History Social History  . Marital Status: Married   Social History Main Topics  . Smoking status: Former Games developer  . Smokeless tobacco: Never Used     Comment: startes at age 27, 2.5 ppd - quit in 1972  . Alcohol Use: Yes  . Drug Use: No   Review of Systems General: No chills, fever, night sweats or weight changes Cardiovascular: No chest pain, dyspnea on exertion,  edema, orthopnea, palpitations, paroxysmal nocturnal dyspnea Dermatological: No rash, lesions or masses Respiratory: No cough, dyspnea Urologic: No hematuria, dysuria Abdominal: No nausea, vomiting, diarrhea, bright red blood per rectum, melena, or hematemesis Neurologic: No visual changes, weakness, changes in mental status All other systems reviewed and are otherwise negative except as noted above.  Physical Exam Blood pressure 152/70, pulse 60, height 6\' 3"  (1.905 m), weight 272 lb 6.4 oz (123.56 kg).  General: Well developed, well appearing 62 year old male in no acute distress. HEENT: Normocephalic, atraumatic. EOMs intact. Sclera nonicteric. Oropharynx clear.  Neck: Supple without bruits. No JVD. Lungs: Respirations regular and unlabored, CTA bilaterally. No wheezes, rales or rhonchi. Heart: RRR. S1, S2 present. No murmurs, rub, S3 or S4. Abdomen: Soft, non-distended.  Extremities: No clubbing, cyanosis or edema. PT/Radials 2+ and equal bilaterally. Psych: Normal affect. Neuro: Alert and oriented X 3. Moves all extremities spontaneously.   Diagnostics Device interrogation today - Thresholds and sensing consistent with previous device measurements. Lead impedance trends stable over time. No mode switch episodes recorded. 1 FVT episode successfully terminated with ATP x1 - Jan 2014. 1 NSVT episode - Nov 2013. Otherwise no ventricular arrhythmia episodes recorded. Patient bi-ventricularly pacing >96% of the time. Device programmed with appropriate safety margins. Heart failure diagnostics reviewed and trends are stable for patient. No changes made this session.  Assessment and Plan 1. Ischemic CM s/p CRT-D implant Normal device function  No programming changes made Continue routine remote follow-up every 3 months Return to clinic for follow-up with Dr. Ladona Ridgel in 6 months 2. Paroxysmal VT Stable No arrhythmias correlating to dizzy episode on 04/23/2013 Continue BB 3. Chronic  systolic HF Stable; euvolemic by exam today Heart failure diagnostics reviewed and trends are stable Continue medical therapy, daily weights and low sodium diet  Signed, Wyatt Thorstenson, PA-C 05/15/2013, 3:46 PM

## 2013-05-15 ENCOUNTER — Encounter: Payer: Self-pay | Admitting: Cardiology

## 2013-05-15 ENCOUNTER — Ambulatory Visit (INDEPENDENT_AMBULATORY_CARE_PROVIDER_SITE_OTHER): Payer: BC Managed Care – PPO | Admitting: Cardiology

## 2013-05-15 ENCOUNTER — Encounter: Payer: Self-pay | Admitting: Internal Medicine

## 2013-05-15 VITALS — BP 152/70 | HR 60 | Ht 75.0 in | Wt 272.4 lb

## 2013-05-15 DIAGNOSIS — I472 Ventricular tachycardia: Secondary | ICD-10-CM

## 2013-05-15 DIAGNOSIS — I5022 Chronic systolic (congestive) heart failure: Secondary | ICD-10-CM

## 2013-05-15 DIAGNOSIS — Z95810 Presence of automatic (implantable) cardiac defibrillator: Secondary | ICD-10-CM

## 2013-05-15 DIAGNOSIS — I255 Ischemic cardiomyopathy: Secondary | ICD-10-CM

## 2013-05-15 DIAGNOSIS — I2589 Other forms of chronic ischemic heart disease: Secondary | ICD-10-CM

## 2013-05-15 DIAGNOSIS — I509 Heart failure, unspecified: Secondary | ICD-10-CM

## 2013-05-15 LAB — ICD DEVICE OBSERVATION
AL AMPLITUDE: 2.9 mv
AL IMPEDENCE ICD: 437 Ohm
BATTERY VOLTAGE: 3.04 V
FVT: 1
LV LEAD THRESHOLD: 1.75 V
PACEART VT: 0
RV LEAD THRESHOLD: 1 V
TZAT-0001ATACH: 1
TZAT-0001SLOWVT: 1
TZAT-0001SLOWVT: 2
TZAT-0002ATACH: NEGATIVE
TZAT-0002ATACH: NEGATIVE
TZAT-0005FASTVT: 88 pct
TZAT-0005FASTVT: 91 pct
TZAT-0011FASTVT: 10 ms
TZAT-0011FASTVT: 10 ms
TZAT-0011SLOWVT: 10 ms
TZAT-0011SLOWVT: 10 ms
TZAT-0012ATACH: 150 ms
TZAT-0013SLOWVT: 2
TZAT-0013SLOWVT: 2
TZAT-0018ATACH: NEGATIVE
TZAT-0018ATACH: NEGATIVE
TZAT-0018FASTVT: NEGATIVE
TZAT-0018SLOWVT: NEGATIVE
TZAT-0018SLOWVT: NEGATIVE
TZAT-0019ATACH: 6 V
TZAT-0019ATACH: 6 V
TZAT-0019SLOWVT: 8 V
TZAT-0019SLOWVT: 8 V
TZAT-0020ATACH: 1.5 ms
TZON-0003FASTVT: 250 ms
TZON-0005SLOWVT: 12
TZST-0001ATACH: 5
TZST-0001FASTVT: 4
TZST-0001FASTVT: 6
TZST-0001SLOWVT: 4
TZST-0001SLOWVT: 6
TZST-0002ATACH: NEGATIVE
TZST-0002ATACH: NEGATIVE
TZST-0002ATACH: NEGATIVE
TZST-0003FASTVT: 35 J
TZST-0003FASTVT: 35 J
TZST-0003SLOWVT: 35 J
TZST-0003SLOWVT: 35 J
TZST-0003SLOWVT: 35 J
VF: 0

## 2013-05-15 NOTE — Patient Instructions (Addendum)
Your physician wants you to follow-up in: 6 MONTHS WITH DR Court Joy will receive a reminder letter in the mail two months in advance. If you don't receive a letter, please call our office to schedule the follow-up appointment.   Remote monitoring is used to monitor your Pacemaker of ICD from home. This monitoring reduces the number of office visits required to check your device to one time per year. It allows [place] to keep an eye on the functioning of your device to ensure it is working properly. You are scheduled for a device check from home on 08-13-13. You may send your transmission at any time that day. If you have a wireless device, the transmission will be sent automatically. After your physician reviews your transmission, you will receive a postcard with your next transmission date.

## 2013-05-17 ENCOUNTER — Encounter: Payer: Self-pay | Admitting: Cardiology

## 2013-07-25 ENCOUNTER — Other Ambulatory Visit: Payer: Self-pay | Admitting: Internal Medicine

## 2013-07-25 ENCOUNTER — Ambulatory Visit
Admission: RE | Admit: 2013-07-25 | Discharge: 2013-07-25 | Disposition: A | Discharge status: Home / Self Care | Payer: BC Managed Care – PPO | Source: Ambulatory Visit | Attending: Internal Medicine | Admitting: Internal Medicine

## 2013-07-25 DIAGNOSIS — C73 Malignant neoplasm of thyroid gland: Secondary | ICD-10-CM

## 2013-08-02 ENCOUNTER — Ambulatory Visit (INDEPENDENT_AMBULATORY_CARE_PROVIDER_SITE_OTHER): Payer: BC Managed Care – PPO | Admitting: Family Medicine

## 2013-08-02 ENCOUNTER — Ambulatory Visit: Payer: BC Managed Care – PPO

## 2013-08-02 VITALS — BP 126/80 | HR 72 | Temp 97.6°F | Resp 18 | Ht 74.0 in | Wt 261.0 lb

## 2013-08-02 DIAGNOSIS — M25562 Pain in left knee: Secondary | ICD-10-CM

## 2013-08-02 DIAGNOSIS — M25462 Effusion, left knee: Secondary | ICD-10-CM

## 2013-08-02 DIAGNOSIS — M25569 Pain in unspecified knee: Secondary | ICD-10-CM

## 2013-08-02 DIAGNOSIS — M25469 Effusion, unspecified knee: Secondary | ICD-10-CM

## 2013-08-02 MED ORDER — TRAMADOL HCL 50 MG PO TABS: 50.0000 mg | ORAL_TABLET | Freq: Three times a day (TID) | ORAL | Status: DC | PRN

## 2013-08-02 NOTE — Patient Instructions (Addendum)
Use the crutches to help you ambulate.  Wear the knee brace for compression and support.  For the next 1-2 days take it easy - elevate the knee, ice (at least 20 minutes at a time 3-4 times per days).  Gently stretching and exercises.  I have put in a referral to ortho just so we'll have that option if we need it - if you're feeling better you can cancel that appt.  Let us know if anything is acutely worsening.   Knee Pain The knee is the complex joint between your thigh and your lower leg. It is made up of bones, tendons, ligaments, and cartilage. The bones that make up the knee are:  The femur in the thigh.  The tibia and fibula in the lower leg.  The patella or kneecap riding in the groove on the lower femur. CAUSES  Knee pain is a common complaint with many causes. A few of these causes are:  Injury, such as:  A ruptured ligament or tendon injury.  Torn cartilage.  Medical conditions, such as:  Gout  Arthritis  Infections  Overuse, over training or overdoing a physical activity. Knee pain can be minor or severe. Knee pain can accompany debilitating injury. Minor knee problems often respond well to self-care measures or get well on their own. More serious injuries may need medical intervention or even surgery. SYMPTOMS The knee is complex. Symptoms of knee problems can vary widely. Some of the problems are:  Pain with movement and weight bearing.  Swelling and tenderness.  Buckling of the knee.  Inability to straighten or extend your knee.  Your knee locks and you cannot straighten it.  Warmth and redness with pain and fever.  Deformity or dislocation of the kneecap. DIAGNOSIS  Determining what is wrong may be very straight forward such as when there is an injury. It can also be challenging because of the complexity of the knee. Tests to make a diagnosis may include:  Your caregiver taking a history and doing a physical exam.  Routine X-rays can be used to rule  out other problems. X-rays will not reveal a cartilage tear. Some injuries of the knee can be diagnosed by:  Arthroscopy a surgical technique by which a small video camera is inserted through tiny incisions on the sides of the knee. This procedure is used to examine and repair internal knee joint problems. Tiny instruments can be used during arthroscopy to repair the torn knee cartilage (meniscus).  Arthrography is a radiology technique. A contrast liquid is directly injected into the knee joint. Internal structures of the knee joint then become visible on X-ray film.  An MRI scan is a non x-ray radiology procedure in which magnetic fields and a computer produce two- or three-dimensional images of the inside of the knee. Cartilage tears are often visible using an MRI scanner. MRI scans have largely replaced arthrography in diagnosing cartilage tears of the knee.  Blood work.  Examination of the fluid that helps to lubricate the knee joint (synovial fluid). This is done by taking a sample out using a needle and a syringe. TREATMENT The treatment of knee problems depends on the cause. Some of these treatments are:  Depending on the injury, proper casting, splinting, surgery or physical therapy care will be needed.  Give yourself adequate recovery time. Do not overuse your joints. If you begin to get sore during workout routines, back off. Slow down or do fewer repetitions.  For repetitive activities such as cycling  or running, maintain your strength and nutrition.  Alternate muscle groups. For example if you are a weight lifter, work the upper body on one day and the lower body the next.  Either tight or weak muscles do not give the proper support for your knee. Tight or weak muscles do not absorb the stress placed on the knee joint. Keep the muscles surrounding the knee strong.  Take care of mechanical problems.  If you have flat feet, orthotics or special shoes may help. See your caregiver  if you need help.  Arch supports, sometimes with wedges on the inner or outer aspect of the heel, can help. These can shift pressure away from the side of the knee most bothered by osteoarthritis.  A brace called an "unloader" brace also may be used to help ease the pressure on the most arthritic side of the knee.  If your caregiver has prescribed crutches, braces, wraps or ice, use as directed. The acronym for this is PRICE. This means protection, rest, ice, compression and elevation.  Nonsteroidal anti-inflammatory drugs (NSAID's), can help relieve pain. But if taken immediately after an injury, they may actually increase swelling. Take NSAID's with food in your stomach. Stop them if you develop stomach problems. Do not take these if you have a history of ulcers, stomach pain or bleeding from the bowel. Do not take without your caregiver's approval if you have problems with fluid retention, heart failure, or kidney problems.  For ongoing knee problems, physical therapy may be helpful.  Glucosamine and chondroitin are over-the-counter dietary supplements. Both may help relieve the pain of osteoarthritis in the knee. These medicines are different from the usual anti-inflammatory drugs. Glucosamine may decrease the rate of cartilage destruction.  Injections of a corticosteroid drug into your knee joint may help reduce the symptoms of an arthritis flare-up. They may provide pain relief that lasts a few months. You may have to wait a few months between injections. The injections do have a small increased risk of infection, water retention and elevated blood sugar levels.  Hyaluronic acid injected into damaged joints may ease pain and provide lubrication. These injections may work by reducing inflammation. A series of shots may give relief for as long as 6 months.  Topical painkillers. Applying certain ointments to your skin may help relieve the pain and stiffness of osteoarthritis. Ask your pharmacist  for suggestions. Many over the-counter products are approved for temporary relief of arthritis pain.  In some countries, doctors often prescribe topical NSAID's for relief of chronic conditions such as arthritis and tendinitis. A review of treatment with NSAID creams found that they worked as well as oral medications but without the serious side effects. PREVENTION  Maintain a healthy weight. Extra pounds put more strain on your joints.  Get strong, stay limber. Weak muscles are a common cause of knee injuries. Stretching is important. Include flexibility exercises in your workouts.  Be smart about exercise. If you have osteoarthritis, chronic knee pain or recurring injuries, you may need to change the way you exercise. This does not mean you have to stop being active. If your knees ache after jogging or playing basketball, consider switching to swimming, water aerobics or other low-impact activities, at least for a few days a week. Sometimes limiting high-impact activities will provide relief.  Make sure your shoes fit well. Choose footwear that is right for your sport.  Protect your knees. Use the proper gear for knee-sensitive activities. Use kneepads when playing volleyball or laying  carpet. Buckle your seat belt every time you drive. Most shattered kneecaps occur in car accidents.  Rest when you are tired. SEEK MEDICAL CARE IF:  You have knee pain that is continual and does not seem to be getting better.  SEEK IMMEDIATE MEDICAL CARE IF:  Your knee joint feels hot to the touch and you have a high fever. MAKE SURE YOU:   Understand these instructions.  Will watch your condition.  Will get help right away if you are not doing well or get worse. Document Released: 09/12/2007 Document Revised: 02/07/2012 Document Reviewed: 09/12/2007 [facility] Patient Information 2014 [place], [place].

## 2013-08-02 NOTE — Progress Notes (Signed)
Xray read and patient discussed with Frances Furbish, PA-C. Agree with assessment and plan of care per her note. XR report noted.  IMPRESSION:  Tricompartmental degenerative changes. Suprapatellar joint  effusion is present.

## 2013-08-02 NOTE — Progress Notes (Signed)
  Subjective:    Patient ID: [name], male    DOB: Oct 29, 1951, 62 y.o.   MRN: [id number]  HPI   Mr. Wurm is a very pleasant 62 yr old male here with complaint of left knee pain and swelling which started this AM.  Pt reports that he had a gout attack last week, calves have been sore since then - attributed this to his altered gait during the flare.  This morning woke up with sharp pain in the lateral posterior knee.  Has pain when flexing and extending.  Pain with bearing weight.  Left knee swollen as compared to right.  Bilateral calves still sore - but no pain or swelling.  No hx of clot, he takes 325mg  ASA daily.  Reports has had "bad knees", left knee tends to bother him more than right.  Denies pain in ankle, foot, hip.  States this does not feel like gout.  No trauma to the knee.  Took 2 Aleve this AM   Review of Systems  Constitutional: Negative.   HENT: Negative.   Respiratory: Negative.   Cardiovascular: Negative.   Gastrointestinal: Negative.   Musculoskeletal: Positive for joint swelling and arthralgias.  Skin: Negative for color change.  Neurological: Negative.        Objective:   Physical Exam  Vitals reviewed. Constitutional: He is oriented to person, place, and time. He appears well-developed and well-nourished. No distress.  HENT:  Head: Normocephalic and atraumatic.  Eyes: Conjunctivae are normal. No scleral icterus.  Pulmonary/Chest: Effort normal.  Musculoskeletal:       Right knee: Normal.       Left knee: He exhibits decreased range of motion, swelling and effusion. He exhibits no ecchymosis, no deformity, no erythema and no bony tenderness. Tenderness (posterior, lateral knee) found. Lateral joint line (slight) tenderness noted. No medial joint line tenderness noted.  Exam is difficult due to significant pain and guarding  Neurological: He is alert and oriented to person, place, and time.  Skin: Skin is warm and dry.  Psychiatric: He has a normal mood and  affect. His behavior is normal.    [facility] reading (PRIMARY) by  Dr. 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 Mostafa is a very pleasant male here with left knee pain and swelling.  X-rays show lateral DJD with spurring.  Suspect lateral meniscal injury secondary to DJD.  Pt has already taken 2 Aleve this morning - can continue Aleve q12h.  Also may use Tramadol q8h prn.  RICE for the next several days.  Hinged knee brace for support/compression.  Pt fitted with crutches to help with ambulation.  WBAT.  Gentle hamstring/calf stretches, ROM exercises, and isometric quad exercises to begin tomorrow.  I have gone ahead and placed a referral to ortho so that this will be set up if he is not improving, can always cancel that appt if improvement with conservative measures.  Discussed RTC precautions.  Pt understands and is in agreement with this plan.  Work note provided for today and tomorrow as pt must be standing/walking at work.

## 2013-08-13 ENCOUNTER — Ambulatory Visit (INDEPENDENT_AMBULATORY_CARE_PROVIDER_SITE_OTHER): Payer: BC Managed Care – PPO | Admitting: *Deleted

## 2013-08-13 DIAGNOSIS — I5022 Chronic systolic (congestive) heart failure: Secondary | ICD-10-CM

## 2013-08-13 DIAGNOSIS — I509 Heart failure, unspecified: Secondary | ICD-10-CM

## 2013-08-13 DIAGNOSIS — I4729 Other ventricular tachycardia: Secondary | ICD-10-CM

## 2013-08-13 DIAGNOSIS — I472 Ventricular tachycardia: Secondary | ICD-10-CM

## 2013-08-13 DIAGNOSIS — Z95810 Presence of automatic (implantable) cardiac defibrillator: Secondary | ICD-10-CM

## 2013-08-15 LAB — REMOTE ICD DEVICE
AL AMPLITUDE: 2.1 mv
ATRIAL PACING ICD: 2.49 pct
BRDY-0002LV: 50 {beats}/min
BRDY-0004LV: 120 {beats}/min
CHARGE TIME: 10.25 s
FVT: 0
LV LEAD IMPEDENCE ICD: 798 Ohm
RV LEAD IMPEDENCE ICD: 342 Ohm
TOT-0001: 1
TZAT-0001FASTVT: 1
TZAT-0001FASTVT: 2
TZAT-0002ATACH: NEGATIVE
TZAT-0002ATACH: NEGATIVE
TZAT-0005SLOWVT: 88 pct
TZAT-0005SLOWVT: 91 pct
TZAT-0011FASTVT: 10 ms
TZAT-0011FASTVT: 10 ms
TZAT-0011SLOWVT: 10 ms
TZAT-0011SLOWVT: 10 ms
TZAT-0012ATACH: 150 ms
TZAT-0018ATACH: NEGATIVE
TZAT-0018FASTVT: NEGATIVE
TZAT-0018FASTVT: NEGATIVE
TZAT-0019ATACH: 6 V
TZAT-0019ATACH: 6 V
TZAT-0019ATACH: 6 V
TZAT-0019FASTVT: 8 V
TZAT-0019FASTVT: 8 V
TZAT-0020ATACH: 1.5 ms
TZAT-0020FASTVT: 1.5 ms
TZAT-0020FASTVT: 1.5 ms
TZON-0003FASTVT: 250 ms
TZON-0003SLOWVT: 350 ms
TZON-0005SLOWVT: 12
TZST-0001ATACH: 5
TZST-0001FASTVT: 4
TZST-0001FASTVT: 5
TZST-0001SLOWVT: 4
TZST-0001SLOWVT: 6
TZST-0003FASTVT: 35 J
TZST-0003FASTVT: 35 J
TZST-0003SLOWVT: 35 J
TZST-0003SLOWVT: 35 J
VENTRICULAR PACING ICD: 96.88 pct
VF: 0

## 2013-08-26 ENCOUNTER — Other Ambulatory Visit: Payer: Self-pay | Admitting: Internal Medicine

## 2013-08-28 ENCOUNTER — Encounter: Payer: Self-pay | Admitting: *Deleted

## 2013-08-29 ENCOUNTER — Ambulatory Visit (INDEPENDENT_AMBULATORY_CARE_PROVIDER_SITE_OTHER): Payer: BC Managed Care – PPO

## 2013-08-29 DIAGNOSIS — Z23 Encounter for immunization: Secondary | ICD-10-CM

## 2013-09-01 ENCOUNTER — Encounter: Payer: Self-pay | Admitting: Internal Medicine

## 2013-09-21 ENCOUNTER — Other Ambulatory Visit: Payer: Self-pay | Admitting: Internal Medicine

## 2013-09-21 NOTE — Telephone Encounter (Signed)
Med filled.  

## 2013-10-16 IMAGING — CR DG KNEE COMPLETE 4+V*L*
4 series · 4 of 4 positions shown · non-contrast
Comparison: None.

CLINICAL DATA: Left knee pain and swelling.

LEFT KNEE - COMPLETE 4+ VIEW

[AP]
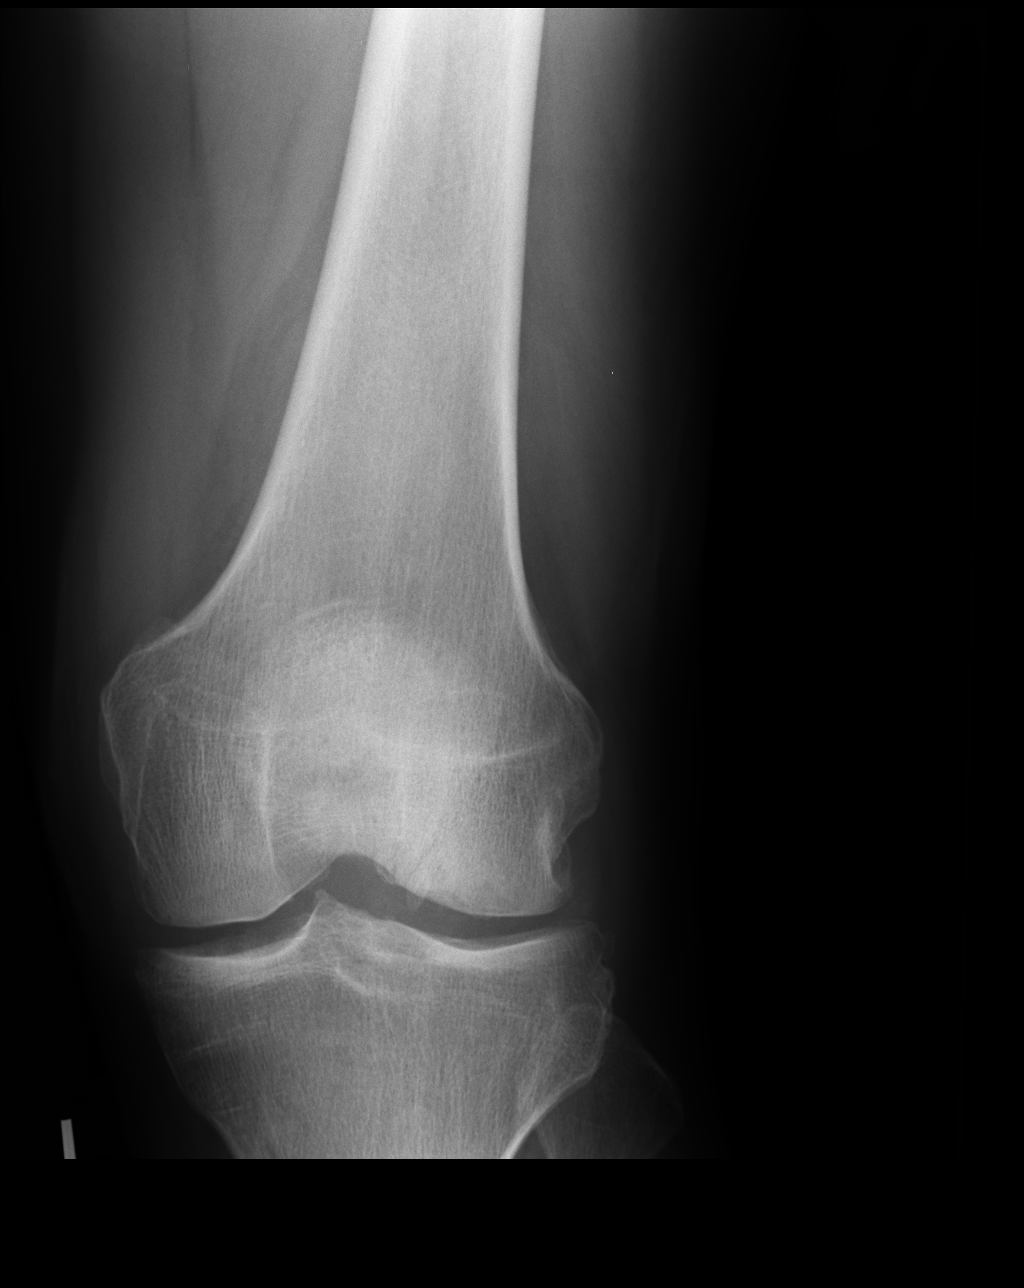

[lateral]
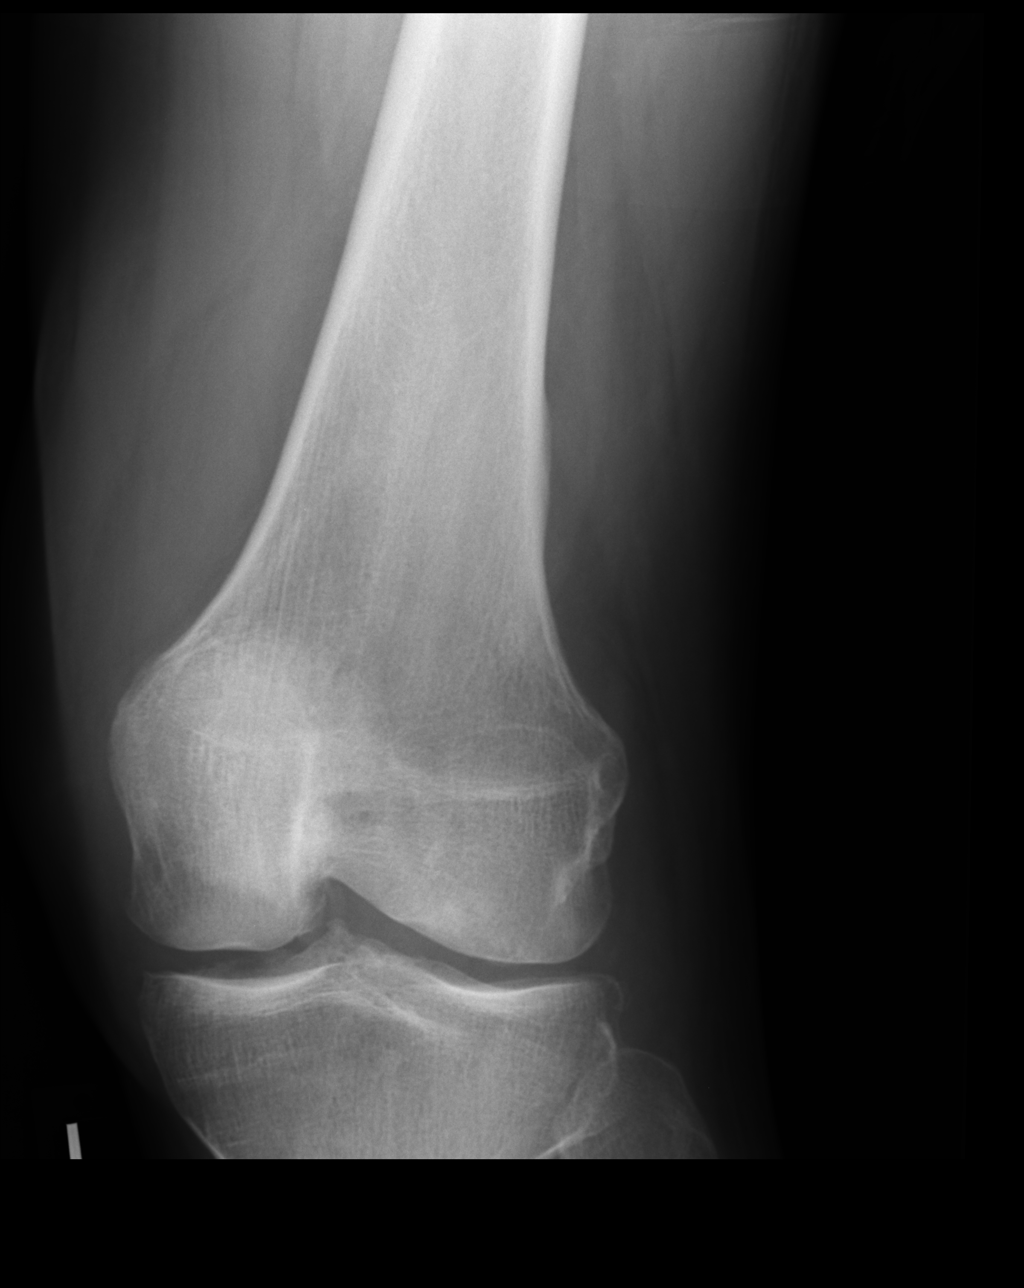

[ap ext rot]
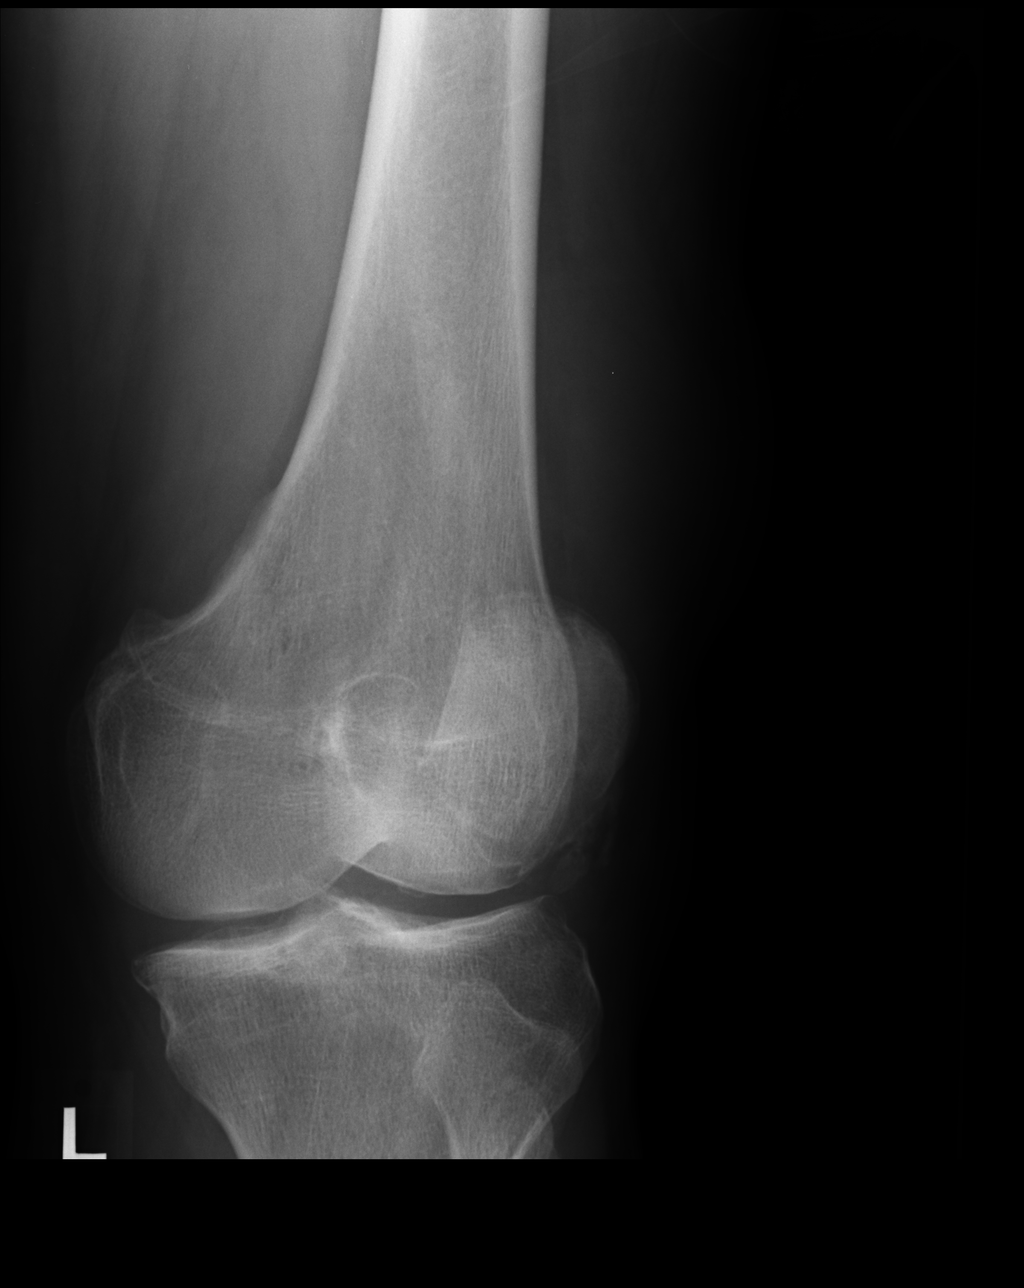

[ap int rot]
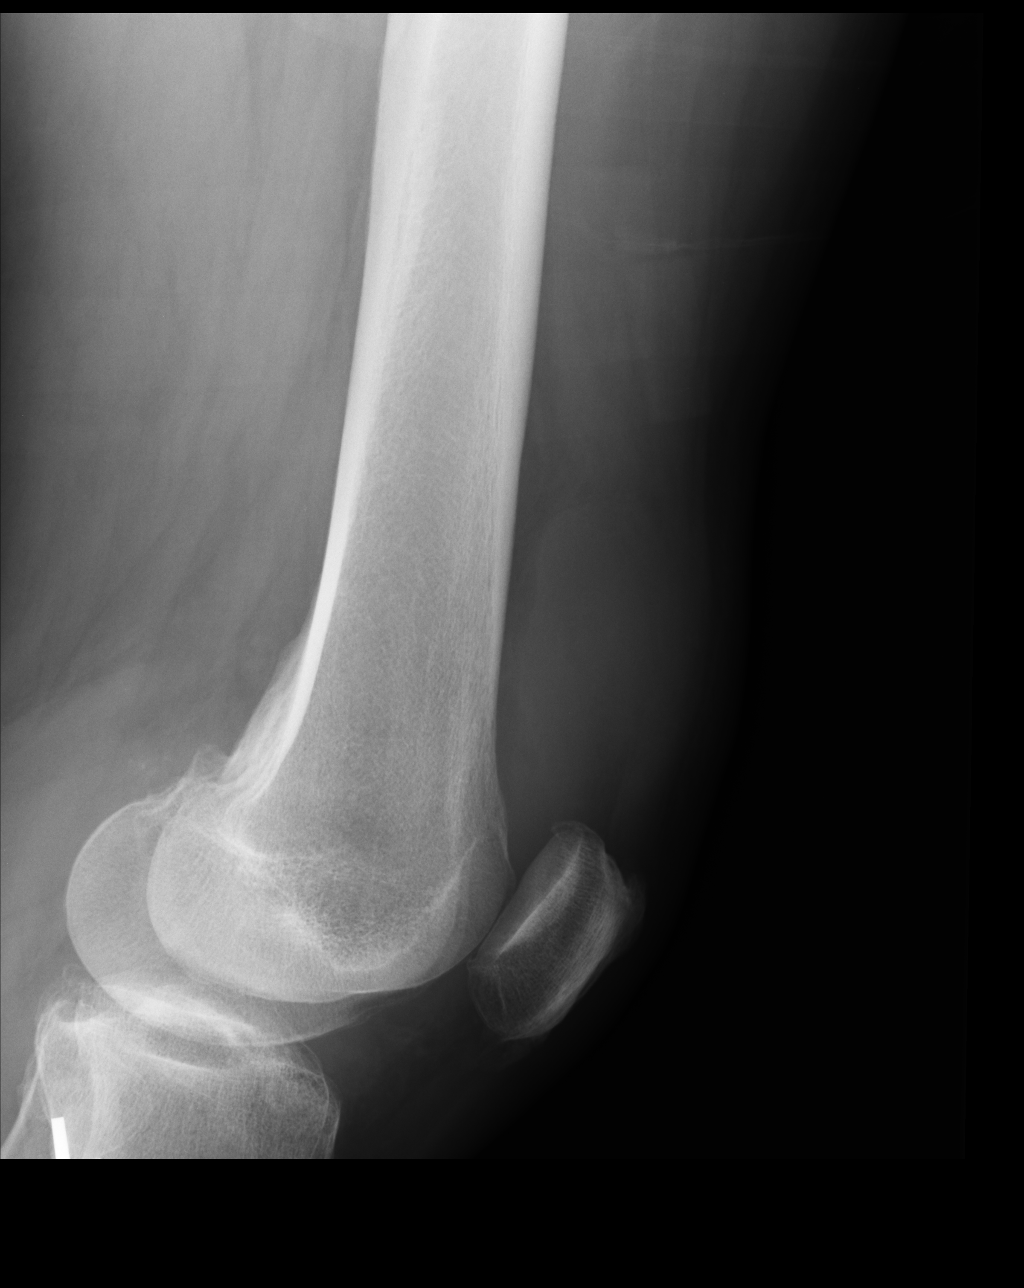

[4 of 4 positions shown; findings below may reference images not displayed]

FINDINGS: Degenerative changes are present consisting of spurring
at the level of the tibial spines and tricompartmental degenerative
changes.  In the lateral view, there is a suprapatellar joint
effusion.  No bony fracture, dislocation or lesion is identified.
IMPRESSION: Tricompartmental degenerative changes.  Suprapatellar joint
effusion is present.

Clinically significant discrepancy from primary report, if
provided: None

## 2013-12-06 ENCOUNTER — Encounter: Payer: Self-pay | Admitting: *Deleted

## 2014-01-21 ENCOUNTER — Other Ambulatory Visit: Payer: Self-pay | Admitting: Internal Medicine

## 2014-01-21 DIAGNOSIS — I10 Essential (primary) hypertension: Secondary | ICD-10-CM

## 2014-01-21 NOTE — Telephone Encounter (Signed)
Refill for coreg sent to [facility]

## 2014-01-23 ENCOUNTER — Encounter: Payer: Self-pay | Admitting: Internal Medicine

## 2014-01-23 ENCOUNTER — Ambulatory Visit (INDEPENDENT_AMBULATORY_CARE_PROVIDER_SITE_OTHER): Payer: 59 | Admitting: Internal Medicine

## 2014-01-23 ENCOUNTER — Telehealth: Payer: Self-pay

## 2014-01-23 VITALS — BP 135/64 | HR 80 | Temp 97.9°F | Ht 74.0 in | Wt 274.0 lb

## 2014-01-23 DIAGNOSIS — I10 Essential (primary) hypertension: Secondary | ICD-10-CM

## 2014-01-23 DIAGNOSIS — Z0000 Encounter for general adult medical examination without abnormal findings: Secondary | ICD-10-CM

## 2014-01-23 DIAGNOSIS — J45909 Unspecified asthma, uncomplicated: Secondary | ICD-10-CM

## 2014-01-23 DIAGNOSIS — Z23 Encounter for immunization: Secondary | ICD-10-CM

## 2014-01-23 DIAGNOSIS — I5022 Chronic systolic (congestive) heart failure: Secondary | ICD-10-CM

## 2014-01-23 MED ORDER — FUROSEMIDE 40 MG PO TABS: 20.0000 mg | ORAL_TABLET | Freq: Every day | ORAL | Status: DC

## 2014-01-23 NOTE — Telephone Encounter (Signed)
Left message for call back Non-identifiable   Flu- 08/29/13 Tdap- Z- [facility]- 08/12/10-Benign polys-Dr. Henrene Pastor- Repeat in 5 years (07/2015)

## 2014-01-23 NOTE — Assessment & Plan Note (Addendum)
TD? Today  Flu- 08/29/13  PNM shot 2011 [facility]- 08/12/10-Benign polys-Dr. Henrene Pastor- Repeat in 5 years (07/2015) Labs Diet and exercise discussed

## 2014-01-23 NOTE — Assessment & Plan Note (Signed)
Occasional shortness of breath when he is exposed to cold air, not associated wheezing or cough, ? reactive airway disease. For now recommend observation

## 2014-01-23 NOTE — Assessment & Plan Note (Signed)
Due to see cardiology, he seems to be doing well despite not taking Lasix. Recommend to go back on Lasix although I will decrease the dose from 40 mg to 20 mg daily. Refer to cardiology

## 2014-01-23 NOTE — Progress Notes (Signed)
Pre visit review using our clinic review tool, if applicable. No additional management support is needed unless otherwise documented below in the visit note. 

## 2014-01-23 NOTE — Progress Notes (Signed)
Subjective:    Patient ID: Mike Jimenez, male    DOB: July 26, 1951, 63 y.o.   MRN: [id number]  DOS:  01/23/2014 Reason for visit:  CPX   ROS Diet-- trying to do well  Exercise-- active, no routine exercise, walk the dogs No fever, chills, rash, myalgias, arthralgias No  CP. No palpitations, no lower extremity edema Denies  nausea, vomiting diarrhea Denies  blood in the stools Mild difficulty breathing when exposed to cold air,  (-) cough, sputum production (-) wheezing, chest congestion No dysuria, gross hematuria, difficulty urinating  No anxiety, depression Denies suicidal ideas    Past Medical History  Diagnosis Date  . Diabetes mellitus   . Hyperlipidemia   . Hypertension   . Hypothyroidism   . Anemia   . Cardiomyopathy     Dr. 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 Dr Harlow Asa  . Gastric ulcer 2011    Past Surgical History  Procedure Laterality Date  . Ptx      traumatic, age 42 months  . Vasectomy    . Cardiac defibrillator placement  03/2010  . Thyroidectomy  12/10/2011    Procedure: THYROIDECTOMY;  Surgeon: Earnstine Regal, MD;  Location: [facility];  Service: General;  Laterality: N/A;  Total thyroidectomy with limited central compartment lymph node dissection.    History   Social History  . Marital Status: Married    Spouse Name: N/A    Number of Children: 2  . Years of Education: N/A   Occupational History  . Warehouse work    Social History Main Topics  . Smoking status: Former Research scientist (life sciences)  . Smokeless tobacco: Never Used     Comment: startes at age 89, 2.5 ppd - quit in 1972  . Alcohol Use: Yes     Comment: socially   . Drug Use: No  . Sexual Activity: Not on file   Other Topics Concern  . Not on file   Social History Narrative   Live w/ wife , daughter lives w/ them    6 dogs, fish tank     Family History  Problem Relation Age of Onset  . Diabetes Maternal  Grandfather   . Diabetes Maternal Aunt   . Colon cancer Maternal Aunt   . Lung cancer Paternal Uncle   . Lung cancer Maternal Uncle   . Asthma Sister   . Atrial fibrillation Father   . Prostate cancer Neg Hx   . CAD Neg Hx        Medication List       This list is accurate as of: 01/23/14 11:59 PM.  Always use your most recent med list.               aspirin 325 MG tablet  Take 325 mg by mouth daily.     atorvastatin 20 MG tablet  Commonly known as:  LIPITOR  Take 20 mg by mouth daily.     carvedilol 6.25 MG tablet  Commonly known as:  COREG  Take 1 tablet (6.25 mg total) by mouth 2 (two) times daily with a meal. Need office visit for additional refills     CIALIS 10 MG tablet  Generic drug:  tadalafil  as needed.     Fish Oil 300 MG Caps  Take by mouth. Dose?     furosemide 40 MG tablet  Commonly known as:  LASIX  Take 0.5  tablets (20 mg total) by mouth daily.     glucose blood test strip  1 each by Other route as needed. [employer], 250.00, check once daily     Lancet Device Misc  by Does not apply route. Wavesense ultra-thin lancets misc, 250.00, check once daily     levothyroxine 200 MCG tablet  Commonly known as:  SYNTHROID, LEVOTHROID  Take 224 mcg by mouth daily.     losartan 25 MG tablet  Commonly known as:  COZAAR  Take one tablet by mouth one time daily     metFORMIN 1000 MG tablet  Commonly known as:  GLUCOPHAGE  TAKE ONE TABLET BY MOUTH TWICE DAILY     multivitamin tablet  Take 1 tablet by mouth daily.     SAW PALMETTO PO  Take 1 tablet by mouth daily.     traMADol 50 MG tablet  Commonly known as:  ULTRAM  Take 1 tablet (50 mg total) by mouth every 8 (eight) hours as needed for pain.           Objective:   Physical Exam BP 135/64  Pulse 80  Temp(Src) 97.9 F (36.6 C)  Ht 6\' 2"  (1.88 m)  Wt 274 lb (124.286 kg)  BMI 35.16 kg/m2  SpO2 97%  General -- alert, well-developed, NAD.  Neck --no JVD HEENT-- Not  pale.   Lungs -- normal respiratory effort, no intercostal retractions, no accessory muscle use, and normal breath sounds.  Heart-- normal rate, regular rhythm, no murmur.  Abdomen-- Not distended, good bowel sounds,soft, non-tender. Rectal-- No external abnormalities noted. Normal sphincter tone. No rectal masses or tenderness. Brown stool  Prostate--Prostate gland firm and smooth, no enlargement, nodularity, tenderness, mass, asymmetry or induration. Extremities-- trace  pretibial edema bilaterally  Neurologic--  alert & oriented X3. Speech normal, gait normal, strength normal in all extremities.  Psych-- Cognition and judgment appear intact. Cooperative with normal attention span and concentration. No anxious or depressed appearing.     Assessment & Plan:

## 2014-01-23 NOTE — Assessment & Plan Note (Signed)
seems well-controlled despite not taking Lasix

## 2014-01-23 NOTE — Patient Instructions (Signed)
Please come back fasting for blood work: CMP, FLP, CBC, PSA-- dx V70  Next visit is for routine check up regards your blood  pressure  in 6 months No need to come back fasting Please make an appointment

## 2014-01-25 ENCOUNTER — Telehealth: Payer: Self-pay | Admitting: Internal Medicine

## 2014-01-25 NOTE — Telephone Encounter (Signed)
Relevant patient education assigned to patient using Emmi. ° °

## 2014-01-28 ENCOUNTER — Ambulatory Visit: Payer: 59 | Admitting: Internal Medicine

## 2014-01-28 NOTE — Telephone Encounter (Signed)
Was unable to reach pre-visit.

## 2014-01-29 ENCOUNTER — Other Ambulatory Visit: Payer: 59

## 2014-01-29 ENCOUNTER — Other Ambulatory Visit (INDEPENDENT_AMBULATORY_CARE_PROVIDER_SITE_OTHER): Payer: 59

## 2014-01-29 DIAGNOSIS — Z0000 Encounter for general adult medical examination without abnormal findings: Secondary | ICD-10-CM

## 2014-01-29 LAB — LIPID PANEL
CHOL/HDL RATIO: 5
Cholesterol: 186 mg/dL (ref 0–200)
HDL: 38.5 mg/dL — AB (ref >39.00)
LDL CALC: 108 mg/dL — AB (ref 0–99)
TRIGLYCERIDES: 196 mg/dL — AB (ref 0.0–149.0)
VLDL: 39.2 mg/dL (ref 0.0–40.0)

## 2014-01-29 LAB — CBC WITH DIFFERENTIAL/PLATELET
BASOS ABS: 0 10*3/uL (ref 0.0–0.1)
Basophils Relative: 0.4 % (ref 0.0–3.0)
Eosinophils Absolute: 0.1 10*3/uL (ref 0.0–0.7)
Eosinophils Relative: 2.4 % (ref 0.0–5.0)
HEMATOCRIT: 43.4 % (ref 39.0–52.0)
Hemoglobin: 14.4 g/dL (ref 13.0–17.0)
LYMPHS ABS: 1.2 10*3/uL (ref 0.7–4.0)
LYMPHS PCT: 20.4 % (ref 12.0–46.0)
MCHC: 33.2 g/dL (ref 30.0–36.0)
MCV: 91.9 fl (ref 78.0–100.0)
MONOS PCT: 9.5 % (ref 3.0–12.0)
Monocytes Absolute: 0.5 10*3/uL (ref 0.1–1.0)
NEUTROS PCT: 67.3 % (ref 43.0–77.0)
Neutro Abs: 3.8 10*3/uL (ref 1.4–7.7)
PLATELETS: 273 10*3/uL (ref 150.0–400.0)
RBC: 4.73 Mil/uL (ref 4.22–5.81)
RDW: 12.8 % (ref 11.5–14.6)
WBC: 5.7 10*3/uL (ref 4.5–10.5)

## 2014-01-29 LAB — COMPREHENSIVE METABOLIC PANEL
ALK PHOS: 71 U/L (ref 39–117)
ALT: 20 U/L (ref 0–53)
AST: 18 U/L (ref 0–37)
Albumin: 4.3 g/dL (ref 3.5–5.2)
BILIRUBIN TOTAL: 1.4 mg/dL — AB (ref 0.3–1.2)
BUN: 20 mg/dL (ref 6–23)
CO2: 31 mEq/L (ref 19–32)
CREATININE: 1.1 mg/dL (ref 0.4–1.5)
Calcium: 9.4 mg/dL (ref 8.4–10.5)
Chloride: 99 mEq/L (ref 96–112)
GFR: 69.89 mL/min (ref >60.00)
Glucose, Bld: 134 mg/dL — ABNORMAL HIGH (ref 70–99)
Potassium: 4.3 mEq/L (ref 3.5–5.1)
Sodium: 136 mEq/L (ref 135–145)
Total Protein: 7.3 g/dL (ref 6.0–8.3)

## 2014-01-29 LAB — PSA: PSA: 1.11 ng/mL (ref 0.10–4.00)

## 2014-01-31 ENCOUNTER — Ambulatory Visit: Payer: 59 | Admitting: Internal Medicine

## 2014-01-31 ENCOUNTER — Encounter: Payer: Self-pay | Admitting: *Deleted

## 2014-01-31 MED ORDER — ATORVASTATIN CALCIUM 40 MG PO TABS: 40.0000 mg | ORAL_TABLET | Freq: Every day | ORAL | Status: DC

## 2014-01-31 NOTE — Addendum Note (Signed)
Addended by: Peggyann Shoals on: 01/31/2014 02:24 PM   Modules accepted: Orders

## 2014-03-06 ENCOUNTER — Ambulatory Visit (INDEPENDENT_AMBULATORY_CARE_PROVIDER_SITE_OTHER): Payer: 59 | Admitting: Internal Medicine

## 2014-03-06 ENCOUNTER — Encounter: Payer: Self-pay | Admitting: Internal Medicine

## 2014-03-06 VITALS — BP 149/92 | HR 81 | Ht 75.0 in | Wt 266.0 lb

## 2014-03-06 DIAGNOSIS — I509 Heart failure, unspecified: Secondary | ICD-10-CM

## 2014-03-06 DIAGNOSIS — Z95810 Presence of automatic (implantable) cardiac defibrillator: Secondary | ICD-10-CM

## 2014-03-06 DIAGNOSIS — I5022 Chronic systolic (congestive) heart failure: Secondary | ICD-10-CM

## 2014-03-06 DIAGNOSIS — I472 Ventricular tachycardia: Secondary | ICD-10-CM

## 2014-03-06 DIAGNOSIS — I4729 Other ventricular tachycardia: Secondary | ICD-10-CM

## 2014-03-06 LAB — MDC_IDC_ENUM_SESS_TYPE_INCLINIC
Battery Voltage: 2.99 V
Brady Statistic AP VP Percent: 1.9 %
Brady Statistic AS VP Percent: 93.97 %
Brady Statistic AS VS Percent: 4.12 %
HighPow Impedance: 43 Ohm
HighPow Impedance: 59 Ohm
Lead Channel Impedance Value: 380 Ohm
Lead Channel Impedance Value: 437 Ohm
Lead Channel Impedance Value: 589 Ohm
Lead Channel Sensing Intrinsic Amplitude: 11.25 mV
Lead Channel Sensing Intrinsic Amplitude: 2.5 mV
Lead Channel Sensing Intrinsic Amplitude: 3 mV
Lead Channel Setting Pacing Amplitude: 2.5 V
Lead Channel Setting Pacing Amplitude: 2.5 V
Lead Channel Setting Pacing Amplitude: 2.75 V
Lead Channel Setting Pacing Pulse Width: 0.4 ms
MDC IDC MSMT LEADCHNL LV IMPEDANCE VALUE: 855 Ohm
MDC IDC MSMT LEADCHNL LV PACING THRESHOLD AMPLITUDE: 1.125 V
MDC IDC MSMT LEADCHNL LV PACING THRESHOLD PULSEWIDTH: 0.4 ms
MDC IDC MSMT LEADCHNL RV IMPEDANCE VALUE: 380 Ohm
MDC IDC MSMT LEADCHNL RV PACING THRESHOLD AMPLITUDE: 1 V
MDC IDC MSMT LEADCHNL RV PACING THRESHOLD PULSEWIDTH: 0.4 ms
MDC IDC MSMT LEADCHNL RV SENSING INTR AMPL: 11.125 mV
MDC IDC SESS DTM: 20150408210941
MDC IDC SET LEADCHNL LV PACING PULSEWIDTH: 0.4 ms
MDC IDC SET LEADCHNL RV SENSING SENSITIVITY: 0.3 mV
MDC IDC SET ZONE DETECTION INTERVAL: 250 ms
MDC IDC SET ZONE DETECTION INTERVAL: 280 ms
MDC IDC SET ZONE DETECTION INTERVAL: 350 ms
MDC IDC SET ZONE DETECTION INTERVAL: 450 ms
MDC IDC STAT BRADY AP VS PERCENT: 0.02 %
MDC IDC STAT BRADY RA PERCENT PACED: 1.92 %
MDC IDC STAT BRADY RV PERCENT PACED: 95.86 %
Zone Setting Detection Interval: 320 ms

## 2014-03-06 NOTE — Progress Notes (Signed)
HPI Mr. Locker returns today for followup. He is a pleasant 63 yo man with an non-ICM, chronic systolic CHF, and VT. He is s/p BiV ICD implant. In the interim, he has had no chest pain or sob or ICD shock. No syncope. He was diagnosed Thyroid CA, and underwent surgery. He is on thyroid replacement.  Allergies  Allergen Reactions  . Ramipril     REACTION: cough     Current Outpatient Prescriptions  Medication Sig Dispense Refill  . aspirin 325 MG tablet Take 325 mg by mouth daily.        Marland Kitchen atorvastatin (LIPITOR) 40 MG tablet Take 40 mg by mouth daily.      . carvedilol (COREG) 6.25 MG tablet Take 6.25 mg by mouth 2 (two) times daily with a meal. Pt states he is only taking this one time in the morning because it makes him sleep to hard if he takes the second dose (03/06/14)      . furosemide (LASIX) 40 MG tablet Take 0.5 tablets (20 mg total) by mouth daily.  30 tablet  1  . levothyroxine (SYNTHROID, LEVOTHROID) 112 MCG tablet Take 224 mcg by mouth daily before breakfast.      . losartan (COZAAR) 25 MG tablet Take one tablet by mouth one time daily  30 tablet  5  . metFORMIN (GLUCOPHAGE) 1000 MG tablet TAKE ONE TABLET BY MOUTH TWICE DAILY  60 tablet  0  . Multiple Vitamin (MULTIVITAMIN) tablet Take 1 tablet by mouth daily.        . Omega-3 Fatty Acids (FISH OIL) 1000 MG CAPS Take 1 capsule by mouth daily.      Marland Kitchen POTASSIUM CHLORIDE PO (OTC) Take 1 capsule once a day      . tadalafil (CIALIS) 20 MG tablet Take 20 mg by mouth daily as needed for erectile dysfunction.      . vitamin E 400 UNIT capsule Take 400 Units by mouth daily.      . [DISCONTINUED] calcium carbonate (OS-CAL - DOSED IN MG OF ELEMENTAL CALCIUM) 1250 MG tablet Take 2 tablets (1,000 mg of elemental calcium total) by mouth 3 (three) times daily.  50 tablet  0   No current facility-administered medications for this visit.     Past Medical History  Diagnosis Date  . Hyperlipidemia   . Hypertension   . Anemia   .  Cardiomyopathy     Dr. 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 Gastric ulcer 2011  . Diabetes mellitus     Type 2  . Tachycardia   . Asthma   . Postsurgical hypothyroidism   . Cancer     Papillary thyroid carcinoma (3 foci) w/o metstases. Dr Harlow Asa  . Obesity   . Torn meniscus     ROS:   All systems reviewed and negative except as noted in the HPI.   Past Surgical History  Procedure Laterality Date  . Ptx      traumatic, age 18 months  . Vasectomy    . Cardiac defibrillator placement  03/2010  . Thyroidectomy  12/10/2011    Procedure: TOTAL THYROIDECTOMY WITH CENTRAL COMPARTMENT DISSECTION;  Surgeon: Earnstine Regal, MD;  Location: [facility];  Service: General;  Laterality: N/A;  Total thyroidectomy with limited central compartment lymph node dissection.     Family History  Problem Relation Age of Onset  . Diabetes Maternal Grandfather   . Diabetes Maternal Aunt   . Colon cancer Maternal Aunt   .  Colon cancer Maternal Aunt   . Lung cancer Paternal Uncle   . Lung cancer Maternal Uncle   . Asthma Sister   . Atrial fibrillation Father   . Prostate cancer Neg Hx   . CAD Neg Hx   . Hypothyroidism Mother   . Hypothyroidism Daughter      History   Social History  . Marital Status: Married    Spouse Name: N/A    Number of Children: 2  . Years of Education: N/A   Occupational History  . Warehouse work    Social History Main Topics  . Smoking status: Former Smoker -- 4 years    Types: Cigarettes    Quit date: 11/29/1970  . Smokeless tobacco: Never Used     Comment: startes at age 40, 2.5 ppd - quit in 1972  . Alcohol Use: Yes     Comment: rare  . Drug Use: No  . Sexual Activity: Not on file   Other Topics Concern  . Not on file   Social History Narrative   Live w/ wife , daughter lives w/ them    6 dogs, fish tank     BP 149/92  Pulse 81  Ht 6\' 3"  (1.905 m)  Wt 266 lb (120.657 kg)  BMI 33.25 kg/m2  Physical Exam:  Well  appearing obese middle aged man, NAD HEENT: Unremarkable Neck:  No JVD, no thyromegally Lungs:  Clear with no wheezes, rales, or rhonchi. Well healed ICD incision. HEART:  Regular rate rhythm, no murmurs, no rubs, no clicks Abd:  soft, positive bowel sounds, no organomegally, no rebound, no guarding Ext:  2 plus pulses, no edema, no cyanosis, no clubbing Skin:  No rashes no nodules Neuro:  CN II through XII intact, motor grossly intact  ECG - nsr with p synchronous biventricular pacing  DEVICE  Normal device function.  See PaceArt for details.   Assess/Plan:

## 2014-03-06 NOTE — Patient Instructions (Signed)
Remote monitoring is used to monitor your ICD from home. This monitoring reduces the number of office visits required to check your device to one time per year. It allows [place] to keep an eye on the functioning of your device to ensure it is working properly. You are scheduled for a device check from home on 06-10-2014. You may send your transmission at any time that day. If you have a wireless device, the transmission will be sent automatically. After your physician reviews your transmission, you will receive a postcard with your next transmission date.  Your physician recommends that you schedule a follow-up appointment in: 12 months with Dr.Taylor

## 2014-03-06 NOTE — Assessment & Plan Note (Signed)
He has had no recurrent ventricular arrhythmias. He will continue his current meds.  

## 2014-03-06 NOTE — Assessment & Plan Note (Signed)
His medtronic BiV ICD is working normally. He will be rechecked in several months.

## 2014-03-06 NOTE — Assessment & Plan Note (Signed)
His systolic heart failure is class 2. He will continue his current medical therapy. I have encouraged the patient to exercise and lose weight.

## 2014-03-22 ENCOUNTER — Other Ambulatory Visit: Payer: Self-pay | Admitting: Internal Medicine

## 2014-04-18 LAB — HM DIABETES EYE EXAM

## 2014-05-13 ENCOUNTER — Other Ambulatory Visit: Payer: Self-pay | Admitting: Internal Medicine

## 2014-06-10 ENCOUNTER — Encounter: Payer: 59 | Admitting: *Deleted

## 2014-06-11 ENCOUNTER — Telehealth: Payer: Self-pay | Admitting: Cardiology

## 2014-06-11 NOTE — Telephone Encounter (Signed)
LMOVM reminding pt to send remote transmission.   

## 2014-06-12 ENCOUNTER — Encounter: Payer: Self-pay | Admitting: Cardiology

## 2014-06-13 ENCOUNTER — Telehealth: Payer: Self-pay

## 2014-06-13 ENCOUNTER — Other Ambulatory Visit: Payer: Self-pay | Admitting: Internal Medicine

## 2014-06-13 DIAGNOSIS — E785 Hyperlipidemia, unspecified: Secondary | ICD-10-CM

## 2014-06-13 DIAGNOSIS — E119 Type 2 diabetes mellitus without complications: Secondary | ICD-10-CM

## 2014-06-13 NOTE — Telephone Encounter (Signed)
mychart message sent and labs ordered

## 2014-06-20 ENCOUNTER — Other Ambulatory Visit (INDEPENDENT_AMBULATORY_CARE_PROVIDER_SITE_OTHER): Payer: 59

## 2014-06-20 ENCOUNTER — Ambulatory Visit (INDEPENDENT_AMBULATORY_CARE_PROVIDER_SITE_OTHER): Payer: 59 | Admitting: *Deleted

## 2014-06-20 VITALS — BP 138/82

## 2014-06-20 DIAGNOSIS — E119 Type 2 diabetes mellitus without complications: Secondary | ICD-10-CM

## 2014-06-20 DIAGNOSIS — I10 Essential (primary) hypertension: Secondary | ICD-10-CM

## 2014-06-20 DIAGNOSIS — E785 Hyperlipidemia, unspecified: Secondary | ICD-10-CM

## 2014-06-20 LAB — LIPID PANEL
CHOL/HDL RATIO: 3
CHOLESTEROL: 121 mg/dL (ref 0–200)
HDL: 34.7 mg/dL — ABNORMAL LOW (ref >39.00)
LDL CALC: 71 mg/dL (ref 0–99)
NonHDL: 86.3
TRIGLYCERIDES: 77 mg/dL (ref 0.0–149.0)
VLDL: 15.4 mg/dL (ref 0.0–40.0)

## 2014-06-20 LAB — HEMOGLOBIN A1C: Hgb A1c MFr Bld: 6.4 % (ref 4.6–6.5)

## 2014-06-21 ENCOUNTER — Ambulatory Visit (INDEPENDENT_AMBULATORY_CARE_PROVIDER_SITE_OTHER): Payer: 59 | Admitting: *Deleted

## 2014-06-21 DIAGNOSIS — I472 Ventricular tachycardia, unspecified: Secondary | ICD-10-CM

## 2014-06-21 DIAGNOSIS — I5022 Chronic systolic (congestive) heart failure: Secondary | ICD-10-CM

## 2014-06-21 DIAGNOSIS — I4729 Other ventricular tachycardia: Secondary | ICD-10-CM

## 2014-06-21 DIAGNOSIS — Z95810 Presence of automatic (implantable) cardiac defibrillator: Secondary | ICD-10-CM

## 2014-06-22 ENCOUNTER — Encounter: Payer: Self-pay | Admitting: Internal Medicine

## 2014-06-22 DIAGNOSIS — Z95810 Presence of automatic (implantable) cardiac defibrillator: Secondary | ICD-10-CM

## 2014-06-22 DIAGNOSIS — I5022 Chronic systolic (congestive) heart failure: Secondary | ICD-10-CM

## 2014-06-22 DIAGNOSIS — I4729 Other ventricular tachycardia: Secondary | ICD-10-CM

## 2014-06-22 DIAGNOSIS — I509 Heart failure, unspecified: Secondary | ICD-10-CM

## 2014-06-22 DIAGNOSIS — I472 Ventricular tachycardia: Secondary | ICD-10-CM

## 2014-06-22 LAB — MDC_IDC_ENUM_SESS_TYPE_REMOTE
Brady Statistic AP VS Percent: 0.02 %
Brady Statistic AS VS Percent: 2.94 %
Brady Statistic RV Percent Paced: 97.05 %
Date Time Interrogation Session: 20150725120831
HIGH POWER IMPEDANCE MEASURED VALUE: 39 Ohm
HIGH POWER IMPEDANCE MEASURED VALUE: 47 Ohm
Lead Channel Impedance Value: 323 Ohm
Lead Channel Impedance Value: 418 Ohm
Lead Channel Impedance Value: 741 Ohm
Lead Channel Pacing Threshold Amplitude: 1.125 V
Lead Channel Pacing Threshold Pulse Width: 0.4 ms
Lead Channel Sensing Intrinsic Amplitude: 2.375 mV
Lead Channel Sensing Intrinsic Amplitude: 9.125 mV
Lead Channel Setting Pacing Amplitude: 2.5 V
Lead Channel Setting Pacing Amplitude: 2.5 V
Lead Channel Setting Pacing Amplitude: 2.75 V
Lead Channel Setting Pacing Pulse Width: 0.4 ms
Lead Channel Setting Sensing Sensitivity: 0.3 mV
MDC IDC MSMT BATTERY VOLTAGE: 2.97 V
MDC IDC MSMT LEADCHNL LV IMPEDANCE VALUE: 494 Ohm
MDC IDC MSMT LEADCHNL RA SENSING INTR AMPL: 2.375 mV
MDC IDC MSMT LEADCHNL RV IMPEDANCE VALUE: 342 Ohm
MDC IDC MSMT LEADCHNL RV SENSING INTR AMPL: 9.125 mV
MDC IDC SET LEADCHNL RV PACING PULSEWIDTH: 0.4 ms
MDC IDC SET ZONE DETECTION INTERVAL: 250 ms
MDC IDC SET ZONE DETECTION INTERVAL: 350 ms
MDC IDC STAT BRADY AP VP PERCENT: 1.07 %
MDC IDC STAT BRADY AS VP PERCENT: 95.97 %
MDC IDC STAT BRADY RA PERCENT PACED: 1.09 %
Zone Setting Detection Interval: 280 ms
Zone Setting Detection Interval: 320 ms
Zone Setting Detection Interval: 450 ms

## 2014-06-24 ENCOUNTER — Encounter: Payer: Self-pay | Admitting: Internal Medicine

## 2014-06-24 NOTE — Progress Notes (Signed)
Remote ICD transmission.   

## 2014-06-24 NOTE — Telephone Encounter (Signed)
Called pt and LMOVM informing pt that he did not have to send the transmission at 8:30 AM that he could send it earlier or later.

## 2014-07-19 ENCOUNTER — Telehealth: Payer: Self-pay | Admitting: *Deleted

## 2014-07-19 NOTE — Telephone Encounter (Signed)
LMOVM to send a manual transmission. Gave my direct # to call back for clarification.

## 2014-07-26 ENCOUNTER — Encounter: Payer: Self-pay | Admitting: Internal Medicine

## 2014-07-31 ENCOUNTER — Other Ambulatory Visit: Payer: Self-pay | Admitting: Internal Medicine

## 2014-07-31 DIAGNOSIS — C73 Malignant neoplasm of thyroid gland: Secondary | ICD-10-CM

## 2014-08-01 LAB — MICROALBUMIN, URINE: MICROALB UR: 47.14

## 2014-08-02 LAB — TSH: TSH: 0.24 u[IU]/mL — AB (ref <=5.90)

## 2014-08-07 ENCOUNTER — Other Ambulatory Visit: Payer: 59

## 2014-08-07 ENCOUNTER — Ambulatory Visit
Admission: RE | Admit: 2014-08-07 | Discharge: 2014-08-07 | Disposition: A | Discharge status: Home / Self Care | Payer: 59 | Source: Ambulatory Visit | Attending: Internal Medicine | Admitting: Internal Medicine

## 2014-08-07 DIAGNOSIS — C73 Malignant neoplasm of thyroid gland: Secondary | ICD-10-CM

## 2014-08-26 ENCOUNTER — Ambulatory Visit (HOSPITAL_BASED_OUTPATIENT_CLINIC_OR_DEPARTMENT_OTHER)
Admission: RE | Admit: 2014-08-26 | Discharge: 2014-08-26 | Disposition: A | Discharge status: Home / Self Care | Payer: 59 | Source: Ambulatory Visit | Attending: Internal Medicine | Admitting: Internal Medicine

## 2014-08-26 ENCOUNTER — Encounter: Payer: Self-pay | Admitting: Internal Medicine

## 2014-08-26 ENCOUNTER — Ambulatory Visit (INDEPENDENT_AMBULATORY_CARE_PROVIDER_SITE_OTHER): Payer: 59 | Admitting: Internal Medicine

## 2014-08-26 VITALS — BP 124/78 | HR 73 | Temp 97.8°F | Wt 260.5 lb

## 2014-08-26 DIAGNOSIS — R079 Chest pain, unspecified: Secondary | ICD-10-CM | POA: Diagnosis present

## 2014-08-26 DIAGNOSIS — I428 Other cardiomyopathies: Secondary | ICD-10-CM | POA: Diagnosis not present

## 2014-08-26 DIAGNOSIS — I5022 Chronic systolic (congestive) heart failure: Secondary | ICD-10-CM

## 2014-08-26 DIAGNOSIS — C73 Malignant neoplasm of thyroid gland: Secondary | ICD-10-CM | POA: Insufficient documentation

## 2014-08-26 DIAGNOSIS — R0989 Other specified symptoms and signs involving the circulatory and respiratory systems: Secondary | ICD-10-CM | POA: Insufficient documentation

## 2014-08-26 DIAGNOSIS — R61 Generalized hyperhidrosis: Secondary | ICD-10-CM | POA: Diagnosis not present

## 2014-08-26 DIAGNOSIS — I509 Heart failure, unspecified: Secondary | ICD-10-CM

## 2014-08-26 LAB — CBC WITH DIFFERENTIAL/PLATELET
BASOS PCT: 0.2 % (ref 0.0–3.0)
Basophils Absolute: 0 10*3/uL (ref 0.0–0.1)
Eosinophils Absolute: 0.1 10*3/uL (ref 0.0–0.7)
Eosinophils Relative: 2.4 % (ref 0.0–5.0)
HEMATOCRIT: 37 % — AB (ref 39.0–52.0)
Hemoglobin: 12.1 g/dL — ABNORMAL LOW (ref 13.0–17.0)
LYMPHS ABS: 0.9 10*3/uL (ref 0.7–4.0)
LYMPHS PCT: 18.5 % (ref 12.0–46.0)
MCHC: 32.8 g/dL (ref 30.0–36.0)
MCV: 90.8 fl (ref 78.0–100.0)
MONOS PCT: 10 % (ref 3.0–12.0)
Monocytes Absolute: 0.5 10*3/uL (ref 0.1–1.0)
NEUTROS ABS: 3.2 10*3/uL (ref 1.4–7.7)
Neutrophils Relative %: 68.9 % (ref 43.0–77.0)
Platelets: 204 10*3/uL (ref 150.0–400.0)
RBC: 4.08 Mil/uL — AB (ref 4.22–5.81)
RDW: 14.4 % (ref 11.5–15.5)
WBC: 4.7 10*3/uL (ref 4.0–10.5)

## 2014-08-26 LAB — BASIC METABOLIC PANEL
BUN: 17 mg/dL (ref 6–23)
CHLORIDE: 106 meq/L (ref 96–112)
CO2: 26 meq/L (ref 19–32)
Calcium: 9.1 mg/dL (ref 8.4–10.5)
Creat: 1.05 mg/dL (ref 0.50–1.35)
Glucose, Bld: 90 mg/dL (ref 70–99)
POTASSIUM: 4.5 meq/L (ref 3.5–5.3)
Sodium: 139 mEq/L (ref 135–145)

## 2014-08-26 LAB — TROPONIN I: Troponin I: 0.03 ng/mL (ref <0.06)

## 2014-08-26 LAB — BRAIN NATRIURETIC PEPTIDE: PRO B NATRI PEPTIDE: 2298 pg/mL — AB (ref 0.0–100.0)

## 2014-08-26 NOTE — Patient Instructions (Addendum)
Get your blood work before you leave   Stop by the first floor and get the XR    furosemide 40 mg to one tablet daily   You will see cardiology this week  ER if symptoms increase, persistent chest pain, fever, chills.

## 2014-08-26 NOTE — Progress Notes (Signed)
Pre visit review using our clinic review tool, if applicable. No additional management support is needed unless otherwise documented below in the visit note. 

## 2014-08-26 NOTE — Progress Notes (Signed)
Subjective:    Patient ID: Mike Jimenez, male    DOB: May 10, 1951, 63 y.o.   MRN: [id number]  DOS:  08/26/2014 Type of visit - description : acute Interval history: Not feeling well for the past 7-10 days: Complaining of difficulty breathing when he lays down, + DOE compared to previous baseline, also having anterior chest pain, worse when he lays down but also when he is active. I check his weight, he has not gained any weight has actually lost 3 pounds. Recently saw endocrinology, was told his blood sugars were okay.  ROS Denies fever or chills No more lower extremity edema than usual No cough, hemoptysis, occasionally he produces a small amount of yellow sputum. Has noted occasional wheezing. Denies any sinus pain or congestion or other than his usual allergies. No changes in his medication.     Past Medical History  Diagnosis Date  . Hyperlipidemia   . Hypertension   . Anemia   . Cardiomyopathy     Dr. 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 Gastric ulcer 2011  . Diabetes mellitus     Type 2  . Tachycardia   . Asthma   . Postsurgical hypothyroidism   . Cancer     Papillary thyroid carcinoma (3 foci) w/o metstases. Dr Harlow Asa  . Obesity   . Torn meniscus     Past Surgical History  Procedure Laterality Date  . Ptx      traumatic, age 47 months  . Vasectomy    . Cardiac defibrillator placement  03/2010  . Thyroidectomy  12/10/2011    Procedure: TOTAL THYROIDECTOMY WITH CENTRAL COMPARTMENT DISSECTION;  Surgeon: Earnstine Regal, MD;  Location: [facility];  Service: General;  Laterality: N/A;  Total thyroidectomy with limited central compartment lymph node dissection.    History   Social History  . Marital Status: Married    Spouse Name: N/A    Number of Children: 2  . Years of Education: N/A   Occupational History  . Warehouse work    Social History Main Topics  . Smoking status: Former Smoker -- 4 years    Types: Cigarettes    Quit date:  11/29/1970  . Smokeless tobacco: Never Used     Comment: startes at age 54, 2.5 ppd - quit in 1972  . Alcohol Use: Yes     Comment: rare  . Drug Use: No  . Sexual Activity: Not on file   Other Topics Concern  . Not on file   Social History Narrative   Live w/ wife , daughter lives w/ them    6 dogs, fish tank        Medication List       This list is accurate as of: 08/26/14  1:24 PM.  Always use your most recent med list.               aspirin 325 MG tablet  Take 325 mg by mouth daily.     atorvastatin 40 MG tablet  Commonly known as:  LIPITOR  TAKE ONE TABLET BY MOUTH AT BEDTIME     carvedilol 6.25 MG tablet  Commonly known as:  COREG  Take 6.25 mg by mouth 2 (two) times daily with a meal. Pt states he is only taking this one time in the morning because it makes him sleep to hard if he takes the second dose (03/06/14)     Fish Oil 1000 MG Caps  Take 1 capsule  by mouth daily.     furosemide 40 MG tablet  Commonly known as:  LASIX  Take 0.5 tablets (20 mg total) by mouth daily.     levothyroxine 112 MCG tablet  Commonly known as:  SYNTHROID, LEVOTHROID  Take 224 mcg by mouth daily before breakfast.     losartan 25 MG tablet  Commonly known as:  COZAAR  TAKE ONE TABLET BY MOUTH ONE TIME DAILY     metFORMIN 1000 MG tablet  Commonly known as:  GLUCOPHAGE  TAKE ONE TABLET BY MOUTH TWICE DAILY     multivitamin tablet  Take 1 tablet by mouth daily.     POTASSIUM CHLORIDE PO  (OTC) Take 1 capsule once a day     tadalafil 20 MG tablet  Commonly known as:  CIALIS  Take 20 mg by mouth daily as needed for erectile dysfunction.     vitamin E 400 UNIT capsule  Take 400 Units by mouth daily.           Objective:   Physical Exam BP 124/78  Pulse 73  Temp(Src) 97.8 F (36.6 C) (Oral)  Wt 260 lb 8 oz (118.162 kg)  SpO2 97% General -- alert, well-developed, NAD.  Neck --no JVD at 45 HEENT-- Not pale.   Lungs -- normal respiratory effort, no intercostal  retractions, no accessory muscle use, increased expiratory time, decrease on that basis Heart-- normal rate, regular rhythm, no murmur.  Abdomen-- Not distended, good bowel sounds,soft, non-tender. No shifting dullness Extremities-- +/+++ pretibial edema bilaterally  Neurologic--  alert & oriented X3. Speech normal, gait appropriate for age, strength symmetric and appropriate for age.  Psych-- Cognition and judgment appear intact. Cooperative with normal attention span and concentration. No anxious or depressed appearing.      Assessment & Plan:    62 year old gentleman with diabetes, Hypertension, cardiomyopathy he presents with orthopnea,dyspnea on exertion and occasional chest pain. No obvious pneumonia on clinical grounds. EKG is paced and seems at baseline DDX is large but includes CHF  -although he has not gained weight- , CAD, pneumonia versus others. I spoke w/ the cards master and Dr. Martinique, based on the information and the fact his cardiomyopathy is nonischemic we'll plan to do an outpatient workup. Plan: Labs Chest x-ray We'll ask cardiology to see this week Increase Lasix ER if sx severe Addendum-- At the end of the visit, he told me that he has not been taking Lasix for the last month, he was trying to avoid it because weather is hot and he is trying to prevent dehydration; his normal dose is half tablet daily, I asked him to take one tablet daily   Today , I spent more than 45 min with the patient: >50% of the time counseling regards the  of care. aso reviewing the chart and labs ordered by other providers  Also coordinating his care

## 2014-08-29 ENCOUNTER — Ambulatory Visit (INDEPENDENT_AMBULATORY_CARE_PROVIDER_SITE_OTHER): Payer: 59 | Admitting: Physician Assistant

## 2014-08-29 ENCOUNTER — Encounter: Payer: Self-pay | Admitting: Physician Assistant

## 2014-08-29 VITALS — BP 130/80 | HR 84 | Ht 75.0 in | Wt 245.0 lb

## 2014-08-29 DIAGNOSIS — I5023 Acute on chronic systolic (congestive) heart failure: Secondary | ICD-10-CM

## 2014-08-29 DIAGNOSIS — I447 Left bundle-branch block, unspecified: Secondary | ICD-10-CM | POA: Insufficient documentation

## 2014-08-29 DIAGNOSIS — I10 Essential (primary) hypertension: Secondary | ICD-10-CM

## 2014-08-29 DIAGNOSIS — I472 Ventricular tachycardia, unspecified: Secondary | ICD-10-CM | POA: Insufficient documentation

## 2014-08-29 DIAGNOSIS — I429 Cardiomyopathy, unspecified: Secondary | ICD-10-CM

## 2014-08-29 DIAGNOSIS — I428 Other cardiomyopathies: Secondary | ICD-10-CM | POA: Insufficient documentation

## 2014-08-29 DIAGNOSIS — E785 Hyperlipidemia, unspecified: Secondary | ICD-10-CM | POA: Insufficient documentation

## 2014-08-29 MED ORDER — FUROSEMIDE 40 MG PO TABS: 40.0000 mg | ORAL_TABLET | Freq: Every day | ORAL | Status: DC

## 2014-08-29 MED ORDER — ASPIRIN EC 81 MG PO TBEC: 81.0000 mg | DELAYED_RELEASE_TABLET | Freq: Every day | ORAL | Status: AC

## 2014-08-29 MED ORDER — LOSARTAN POTASSIUM 50 MG PO TABS: 50.0000 mg | ORAL_TABLET | Freq: Every day | ORAL | Status: DC

## 2014-08-29 NOTE — Patient Instructions (Signed)
Your physician has recommended you make the following change in your medication:  1) REDUCE Aspirin to 81mg  daily 2) INCREASE Losartan to 50mg  daily. An Rx has been sent to your pharmacy 3) CONTINUE Lasix 40mg  daily. An Rx has been sent to your pharmacy  Your physician has requested that you have an echocardiogram. Echocardiography is a painless test that uses sound waves to create images of your heart. It provides your doctor with information about the size and shape of your heart and how well your heart's chambers and valves are working. This procedure takes approximately one hour. There are no restrictions for this procedure.   Your physician recommends that you return for lab work in: 2 weeks (Bmet)  Your physician recommends that you schedule a follow-up appointment in:2 weeks with Lisbeth Renshaw Dunn,PA/ or Dr.Taylor  Follow up with your primary care physician for night sweats  Please send a remote transmission for your ICD

## 2014-08-29 NOTE — Progress Notes (Addendum)
[facility], [address], [place]  [address] Phone: [phone] Fax:  [phone]  Date:  08/29/2014   Patient ID:  Mike Jimenez, Mike Jimenez 10/05/1951, MRN [id number]   PCP:  Unice Cobble, MD  Cardiologist: Dr. Lovena Le   History of Present Illness: [name] is a 63 y.o. male with history of chronic systolic CHF/NICM (dx 3149 in setting of sustained VT with normal cors at that time, s/p BiV-ICD), HTN, HLD, thyroid cancer s/p surgery who presents for evaluation of SOB. His EF was 10-15% by cath 03/2010 and last echo was also at that time with EF 20-25%.  For the last month he has only been taking 1/2 tablet of Lasix (20mg  total) daily due to the hot weather and trying to prevent dehydration. He also drinks quite a bit of water daily. About a month ago he was working around mold at work and several of his coworkers including himself became congested. This soon improved but then he had return of SOB with exertion as well as orthopnea. He also had a sensation of chest tightness when laying flat. He has been trying to lose weight on purpose with good diet choices. He denied any significant LEE. He saw his PCP who checked a pBNP which was 2298; CXR 08/26/14: Enlargement of cardiac silhouette with pulmonary vascular congestion post AICD, no acute abnormalities. He was asked to increase Lasix to 40mg  daily and f/u here. Since his visit with PCP he says he feels 100% better. He is urinating vigorously and dyspnea is subsiding. He's not yet back to baseline but is feeling much better. No further chest pain. No syncope, ICD discharge, or palpitations.  He is 245 in the office today, confirmed on 2 scales. He says this is more in line with his weight at home of 241 than the recent weight on 08/26/14 of 260.    Recent Labs: 01/29/2014: ALT 20  06/20/2014: HDL Cholesterol by NMR 34.70*; LDL (calc) 71  08/26/2014: Creatinine 1.05; Hemoglobin 12.1*; Potassium 4.5; Pro B Natriuretic peptide (BNP)  2298.0*   Wt Readings from Last 3 Encounters:  08/29/14 245 lb (111.131 kg)  08/26/14 260 lb 8 oz (118.162 kg)  03/06/14 266 lb (120.657 kg)     Past Medical History  Diagnosis Date  . Hyperlipidemia   . Hypertension   . Anemia   . NICM (nonischemic cardiomyopathy)     a. Dx 2011 - presented with sustained VT. Normal coronaries at that time, EF 10-15% by cath and 20-25% by echo. s/p biventricular ICD 2011.  . Gastric ulcer 2011  . Diabetes mellitus     Type 2  . Ventricular tachycardia     a. Sustained VT 2011 s/p BiV-ICD.  Marland Kitchen Asthma     a. Childhood asthma.  . Postsurgical hypothyroidism   . Thyroid cancer     Papillary thyroid carcinoma (3 foci) w/o metastases. Dr Harlow Asa. s/p thyroidectomy 2013.  . Obesity   . Torn meniscus   . LBBB (left bundle branch block)     Current Outpatient Prescriptions  Medication Sig Dispense Refill  . aspirin 325 MG tablet Take 325 mg by mouth daily.        Marland Kitchen atorvastatin (LIPITOR) 40 MG tablet TAKE ONE TABLET BY MOUTH AT BEDTIME   30 tablet  6  . carvedilol (COREG) 6.25 MG tablet Take 6.25 mg by mouth 2 (two) times daily with a meal.       . furosemide (LASIX) 40 MG  tablet Take 40 mg by mouth daily.      Marland Kitchen levothyroxine (SYNTHROID, LEVOTHROID) 112 MCG tablet Take 2 tablets daily      . losartan (COZAAR) 25 MG tablet TAKE ONE TABLET BY MOUTH ONE TIME DAILY   30 tablet  4  . metFORMIN (GLUCOPHAGE) 1000 MG tablet TAKE ONE TABLET BY MOUTH TWICE DAILY  60 tablet  0  . Multiple Vitamin (MULTIVITAMIN) tablet Take 1 tablet by mouth daily.        . Omega-3 Fatty Acids (FISH OIL) 1000 MG CAPS Take 1 capsule by mouth daily.      Marland Kitchen POTASSIUM CHLORIDE PO (OTC) Take 1 capsule once a day      . tadalafil (CIALIS) 20 MG tablet Take 20 mg by mouth daily as needed for erectile dysfunction.      . vitamin E 400 UNIT capsule Take 400 Units by mouth daily.      . [DISCONTINUED] calcium carbonate (OS-CAL - DOSED IN MG OF ELEMENTAL CALCIUM) 1250 MG tablet Take 2  tablets (1,000 mg of elemental calcium total) by mouth 3 (three) times daily.  50 tablet  0   No current facility-administered medications for this visit.    Allergies:   Ramipril   Social History:  The patient  reports that he quit smoking about 43 years ago. His smoking use included Cigarettes. He smoked 0.00 packs per day for 4 years. He has never used smokeless tobacco. He reports that he drinks alcohol. He reports that he does not use illicit drugs.   Family History:  The patient's family history includes Asthma in his sister; Atrial fibrillation in his father; Colon cancer in his maternal aunt and maternal aunt; Diabetes in his maternal aunt and maternal grandfather; Hypothyroidism in his daughter and mother; Lung cancer in his maternal uncle and paternal uncle. There is no history of Prostate cancer or CAD.  ROS:  Please see the history of present illness. He does report night sweats that precede these recent events.  All other systems reviewed and negative.   PHYSICAL EXAM:  VS:  BP 130/80  Pulse 84  Ht 6\' 3"  (1.905 m)  Wt 245 lb (111.131 kg)  BMI 30.62 kg/m2 Well nourished, well developed WM in no acute distress HEENT: normal Neck: no JVD Cardiac:  normal S1, S2; RRR; no murmur Lungs:  clear to auscultation bilaterally, no wheezing, rhonchi or rales Abd: soft, nontender, no hepatomegaly Ext: no edema Skin: warm and dry Neuro:  moves all extremities spontaneously, no focal abnormalities noted  EKG:  Atrial sensed, v-paced rhythm 84bpm    ASSESSMENT AND PLAN:  1. Acute on chronic systolic CHF/NICM s/p BiV-ICD - we discussed  fluid restriction <64oz per day today. He does weigh himself daily. Agree with continuing Lasix 40mg  daily since he continues to diurese well with symptom improvement. He had some vague chest pain when recumbent but this has resolved, so suspect it was related to his vascular congestion. He has h/o normal cors 2011. Will update 2D echocardiogram. If EF  continues to be low, consider referral to [facility]. We likely need to titrate his CHF regimen further - will start by increasing losartan to 50mg  daily. Recheck BMET 2 weeks. Decrease ASA to 81mg  daily due to h/o anemia. 2. HTN - follow with losartan increase. 3. H/o VT 2011 - quiescent. Will ask him to send a device transmission when he gets home to exclude any interim arrhythmias. 4. Diabetes mellitus - followed by PCP.  Dispo: F/u 2 weeks with BMET in our office with APP or Dr. Lovena Le. I also asked him to discuss night sweats further with PCP since these precede his CHF sx.  Signed, Melina Copa, PA-C  08/29/2014 3:39 PM

## 2014-09-02 ENCOUNTER — Encounter: Payer: Self-pay | Admitting: Medical

## 2014-09-02 ENCOUNTER — Ambulatory Visit (INDEPENDENT_AMBULATORY_CARE_PROVIDER_SITE_OTHER): Payer: 59 | Admitting: Medical

## 2014-09-02 VITALS — BP 138/87 | HR 88 | Temp 99.7°F | Ht 74.0 in | Wt 244.4 lb

## 2014-09-02 DIAGNOSIS — I472 Ventricular tachycardia, unspecified: Secondary | ICD-10-CM

## 2014-09-02 DIAGNOSIS — R61 Generalized hyperhidrosis: Secondary | ICD-10-CM

## 2014-09-02 DIAGNOSIS — I5033 Acute on chronic diastolic (congestive) heart failure: Secondary | ICD-10-CM

## 2014-09-02 DIAGNOSIS — J208 Acute bronchitis due to other specified organisms: Secondary | ICD-10-CM

## 2014-09-02 DIAGNOSIS — J209 Acute bronchitis, unspecified: Secondary | ICD-10-CM | POA: Insufficient documentation

## 2014-09-02 DIAGNOSIS — I5022 Chronic systolic (congestive) heart failure: Secondary | ICD-10-CM

## 2014-09-02 MED ORDER — CEFDINIR 300 MG PO CAPS: 300.0000 mg | ORAL_CAPSULE | Freq: Two times a day (BID) | ORAL | Status: DC

## 2014-09-02 MED ORDER — BENZONATATE 100 MG PO CAPS: 100.0000 mg | ORAL_CAPSULE | Freq: Three times a day (TID) | ORAL | Status: DC | PRN

## 2014-09-02 NOTE — Assessment & Plan Note (Signed)
He has history of this. By auscultation he ha normal rhythm His pulse was 88. I advised due to history of cardiac condition I want to get a ekg for safety sake. Pt declined despite my recommendation. I did explain to him that rhythms can change quickly and be seen ED or UC if any chest pain, palpitations or any symptoms like he had earlier today. He agreed he would do so.  I also wanted to get cxr to assess if he has any possible walking pneumonia and to assess if any effusion in light of his recent bnp as well. But since he felt fine at the time of exam he declined cxr as well.

## 2014-09-02 NOTE — Assessment & Plan Note (Addendum)
Pt states feels better since increase on lasix dosage and breathing better. He declines cxr today. He is seeing cardiologist on Friday. I did go ahead and get bnp today and cmp today.(check electrolytes)

## 2014-09-02 NOTE — Assessment & Plan Note (Signed)
Some mild nasal congestion and chest congestion past week. Mild occasional productive cough. I did rx antibiotic today and benzonatate for cough. I will check his cbc today and see if wbc elevated since diaphoresis could represent a fever. Also when cbc drawn will check his cmp to see if his electrolytes are off balance such as k or na.

## 2014-09-02 NOTE — Patient Instructions (Signed)
For your light headed, nausea and sweating earlier today you have declined cardiac work up and cxr. If this were to occur again then go to ED or UC. Follow up with your cardiologist this Friday.  For your recent chest congestion, I am treating you for bronchitis with cefdinir. I am prescribing benzonatate for cough. I do want to check your cbc and cmp today.   Recent chf with elevated bnp. I will repeat this test today.  Follow up in 7 days or as needed.

## 2014-09-02 NOTE — Progress Notes (Signed)
Subjective:    Patient ID: Mike Jimenez, male    DOB: 08/21/1951, 63 y.o.   MRN: [id number]  HPI  At work he got all sudden very light headed and felt nausea and sweating. This last for 15-20 minutes. No chest pain. No palpitation. He feels some nasal congestion and chest congestion He went home went home and layed down. He checked his blood sugar readings and blood sugar was 127. He states after laying down resting 1-2 hours know feels good/normal.  Last Monday pt saw Dr. [name]. Pt was having some shortness of breath  laying down flat on his back. Last Monday had work up for orthopnea and fatigue. Pt states he is taking 40 mg of lasix and was previously taking 20 mg. Pt is going to get echocardiogram on Friday.  His b nat peptide was elevated recently. He did see cardiologist on oct 1st and has appointment this coming friday  Recent nasal congested with some cough and mild chest congestion over last week. He coughs/brings up a little mucous  Pt declines cxr and ekg. Although  I recommended and discussed with him why I want to do test. He states he feels good now and since he is going to see cardiologist on Friday he declines testing today.  Past Medical History  Diagnosis Date  . Hyperlipidemia   . Hypertension   . Anemia   . NICM (nonischemic cardiomyopathy)     a. Dx 2011 - presented with sustained VT. Normal coronaries at that time, EF 10-15% by cath and 20-25% by echo. s/p biventricular ICD 2011.  . Gastric ulcer 2011  . Diabetes mellitus     Type 2  . Ventricular tachycardia     a. Sustained VT 2011 s/p BiV-ICD.  Marland Kitchen Asthma     a. Childhood asthma.  . Postsurgical hypothyroidism   . Thyroid cancer     Papillary thyroid carcinoma (3 foci) w/o metastases. Dr Harlow Asa. s/p thyroidectomy 2013.  . Obesity   . Torn meniscus   . LBBB (left bundle branch block)     History   Social History  . Marital Status: Married    Spouse Name: N/A    Number of Children: 2  . Years of  Education: N/A   Occupational History  . Warehouse work    Social History Main Topics  . Smoking status: Former Smoker -- 4 years    Types: Cigarettes    Quit date: 11/29/1970  . Smokeless tobacco: Never Used     Comment: startes at age 72, 2.5 ppd - quit in 1972  . Alcohol Use: Yes     Comment: rare  . Drug Use: No  . Sexual Activity: Not on file   Other Topics Concern  . Not on file   Social History Narrative   Live w/ wife , daughter lives w/ them    6 dogs, fish tank    Past Surgical History  Procedure Laterality Date  . Ptx      traumatic, age 61 months  . Vasectomy    . Cardiac defibrillator placement  03/2010  . Thyroidectomy  12/10/2011    Procedure: TOTAL THYROIDECTOMY WITH CENTRAL COMPARTMENT DISSECTION;  Surgeon: Earnstine Regal, MD;  Location: [facility];  Service: General;  Laterality: N/A;  Total thyroidectomy with limited central compartment lymph node dissection.    Family History  Problem Relation Age of Onset  . Diabetes Maternal Grandfather   . Diabetes Maternal Aunt   . Colon  cancer Maternal Aunt   . Colon cancer Maternal Aunt   . Lung cancer Paternal Uncle   . Lung cancer Maternal Uncle   . Asthma Sister   . Atrial fibrillation Father   . Prostate cancer Neg Hx   . CAD Neg Hx   . Hypothyroidism Mother   . Hypothyroidism Daughter     Allergies  Allergen Reactions  . Ramipril     REACTION: cough    Current Outpatient Prescriptions on File Prior to Visit  Medication Sig Dispense Refill  . aspirin EC 81 MG tablet Take 1 tablet (81 mg total) by mouth daily.      Marland Kitchen atorvastatin (LIPITOR) 40 MG tablet TAKE ONE TABLET BY MOUTH AT BEDTIME   30 tablet  6  . carvedilol (COREG) 6.25 MG tablet Take 6.25 mg by mouth 2 (two) times daily with a meal.       . furosemide (LASIX) 40 MG tablet Take 1 tablet (40 mg total) by mouth daily.  30 tablet  5  . levothyroxine (SYNTHROID, LEVOTHROID) 112 MCG tablet Take 2 tablets daily      . losartan (COZAAR) 50 MG  tablet Take 1 tablet (50 mg total) by mouth daily.  30 tablet  5  . metFORMIN (GLUCOPHAGE) 1000 MG tablet TAKE ONE TABLET BY MOUTH TWICE DAILY  60 tablet  0  . Multiple Vitamin (MULTIVITAMIN) tablet Take 1 tablet by mouth daily.        . Omega-3 Fatty Acids (FISH OIL) 1000 MG CAPS Take 1 capsule by mouth daily.      Marland Kitchen POTASSIUM CHLORIDE PO (OTC) Take 1 capsule once a day      . tadalafil (CIALIS) 20 MG tablet Take 20 mg by mouth daily as needed for erectile dysfunction.      . vitamin E 400 UNIT capsule Take 400 Units by mouth daily.      . [DISCONTINUED] calcium carbonate (OS-CAL - DOSED IN MG OF ELEMENTAL CALCIUM) 1250 MG tablet Take 2 tablets (1,000 mg of elemental calcium total) by mouth 3 (three) times daily.  50 tablet  0   No current facility-administered medications on file prior to visit.    BP 138/87  Pulse 88  Temp(Src) 99.7 F (37.6 C) (Oral)  Ht 6\' 2"  (1.88 m)  Wt 244 lb 6.4 oz (110.859 kg)  BMI 31.37 kg/m2  SpO2 96%       Review of Systems  Constitutional: Positive for diaphoresis. Negative for fever, chills and fatigue.       None presently but earlier today.  HENT: Positive for congestion. Negative for ear discharge, ear pain, facial swelling, mouth sores, nosebleeds, postnasal drip, rhinorrhea, sinus pressure, sneezing, sore throat, trouble swallowing and voice change.   Respiratory: Positive for cough. Negative for chest tightness, shortness of breath and wheezing.        Rare cough  Cardiovascular: Negative for chest pain, palpitations and leg swelling.  Gastrointestinal: Positive for nausea. Negative for vomiting, abdominal pain, diarrhea, constipation and abdominal distention.       None now earlier today with diaphoresis.  Musculoskeletal: Negative.   Neurological: Positive for light-headedness. Negative for dizziness, tremors, seizures, syncope, facial asymmetry, speech difficulty, weakness, numbness and headaches.  Hematological: Negative for adenopathy.  Does not bruise/bleed easily.  Psychiatric/Behavioral: Negative.        Objective:   Physical Exam  Constitutional: He is oriented to person, place, and time. He appears well-developed and well-nourished. No distress.  No acute distress.  HENT:  Head: Normocephalic and atraumatic.  Right Ear: External ear normal.  Left Ear: External ear normal.  Nose: Nose normal.  Mouth/Throat: Oropharynx is clear and moist. No oropharyngeal exudate.  Eyes: Conjunctivae and EOM are normal. Pupils are equal, round, and reactive to light. Right eye exhibits no discharge. Left eye exhibits no discharge.  Neck: Normal range of motion. Neck supple. No JVD present. No tracheal deviation present. No thyromegaly present.  Cardiovascular: Normal rate, regular rhythm and normal heart sounds.  Exam reveals no gallop and no friction rub.   No murmur heard. Pulmonary/Chest: Effort normal and breath sounds normal. No stridor. No respiratory distress. He has no wheezes. He has no rales. He exhibits no tenderness.  Abdominal: Soft. He exhibits no distension and no mass. There is no tenderness. There is no rebound and no guarding.  Musculoskeletal: He exhibits no edema and no tenderness.  Lower extremity- neg homans sign.  No pedal edema.  Lymphadenopathy:    He has no cervical adenopathy.  Neurological: He is alert and oriented to person, place, and time. No cranial nerve deficit. Coordination normal.  CN III- XII grossly intract. Negative romberg. No gross motor or sensory function deficits.  Skin: He is not diaphoretic.  Psychiatric: He has a normal mood and affect. His behavior is normal. Judgment and thought content normal.          Assessment & Plan:

## 2014-09-03 ENCOUNTER — Emergency Department (HOSPITAL_COMMUNITY)
Admission: EM | Admit: 2014-09-03 | Discharge: 2014-09-03 | Disposition: A | Discharge status: Home / Self Care | Payer: 59 | Attending: Emergency Medicine | Admitting: Emergency Medicine

## 2014-09-03 ENCOUNTER — Emergency Department (HOSPITAL_COMMUNITY): Payer: 59

## 2014-09-03 ENCOUNTER — Encounter (HOSPITAL_COMMUNITY): Payer: Self-pay | Admitting: Emergency Medicine

## 2014-09-03 DIAGNOSIS — I509 Heart failure, unspecified: Secondary | ICD-10-CM | POA: Insufficient documentation

## 2014-09-03 DIAGNOSIS — R531 Weakness: Secondary | ICD-10-CM | POA: Diagnosis not present

## 2014-09-03 DIAGNOSIS — I10 Essential (primary) hypertension: Secondary | ICD-10-CM | POA: Diagnosis not present

## 2014-09-03 DIAGNOSIS — Z7982 Long term (current) use of aspirin: Secondary | ICD-10-CM | POA: Diagnosis not present

## 2014-09-03 DIAGNOSIS — E785 Hyperlipidemia, unspecified: Secondary | ICD-10-CM | POA: Insufficient documentation

## 2014-09-03 DIAGNOSIS — Z79899 Other long term (current) drug therapy: Secondary | ICD-10-CM | POA: Insufficient documentation

## 2014-09-03 DIAGNOSIS — R55 Syncope and collapse: Secondary | ICD-10-CM | POA: Diagnosis not present

## 2014-09-03 DIAGNOSIS — R0602 Shortness of breath: Secondary | ICD-10-CM | POA: Diagnosis not present

## 2014-09-03 DIAGNOSIS — M1990 Unspecified osteoarthritis, unspecified site: Secondary | ICD-10-CM | POA: Diagnosis not present

## 2014-09-03 DIAGNOSIS — E119 Type 2 diabetes mellitus without complications: Secondary | ICD-10-CM | POA: Diagnosis not present

## 2014-09-03 DIAGNOSIS — Z87891 Personal history of nicotine dependence: Secondary | ICD-10-CM | POA: Insufficient documentation

## 2014-09-03 DIAGNOSIS — R42 Dizziness and giddiness: Secondary | ICD-10-CM | POA: Diagnosis not present

## 2014-09-03 DIAGNOSIS — E669 Obesity, unspecified: Secondary | ICD-10-CM | POA: Insufficient documentation

## 2014-09-03 DIAGNOSIS — Z85850 Personal history of malignant neoplasm of thyroid: Secondary | ICD-10-CM | POA: Insufficient documentation

## 2014-09-03 DIAGNOSIS — Z792 Long term (current) use of antibiotics: Secondary | ICD-10-CM | POA: Diagnosis not present

## 2014-09-03 DIAGNOSIS — Z95810 Presence of automatic (implantable) cardiac defibrillator: Secondary | ICD-10-CM

## 2014-09-03 DIAGNOSIS — J45901 Unspecified asthma with (acute) exacerbation: Secondary | ICD-10-CM | POA: Diagnosis not present

## 2014-09-03 DIAGNOSIS — I429 Cardiomyopathy, unspecified: Secondary | ICD-10-CM

## 2014-09-03 DIAGNOSIS — Z87828 Personal history of other (healed) physical injury and trauma: Secondary | ICD-10-CM | POA: Insufficient documentation

## 2014-09-03 DIAGNOSIS — E890 Postprocedural hypothyroidism: Secondary | ICD-10-CM | POA: Diagnosis not present

## 2014-09-03 DIAGNOSIS — I428 Other cardiomyopathies: Secondary | ICD-10-CM

## 2014-09-03 DIAGNOSIS — Z862 Personal history of diseases of the blood and blood-forming organs and certain disorders involving the immune mechanism: Secondary | ICD-10-CM | POA: Diagnosis not present

## 2014-09-03 DIAGNOSIS — I5022 Chronic systolic (congestive) heart failure: Secondary | ICD-10-CM

## 2014-09-03 HISTORY — DX: Type 2 diabetes mellitus without complications: E11.9

## 2014-09-03 LAB — CBC WITH DIFFERENTIAL/PLATELET
BASOS ABS: 0 10*3/uL (ref 0.0–0.1)
BASOS PCT: 0.1 % (ref 0.0–3.0)
Basophils Absolute: 0 10*3/uL (ref 0.0–0.1)
Basophils Relative: 0 % (ref 0–1)
EOS PCT: 1 % (ref 0–5)
Eosinophils Absolute: 0 10*3/uL (ref 0.0–0.7)
Eosinophils Absolute: 0.1 10*3/uL (ref 0.0–0.7)
Eosinophils Relative: 0.6 % (ref 0.0–5.0)
HCT: 39.3 % (ref 39.0–52.0)
HEMATOCRIT: 40.8 % (ref 39.0–52.0)
HEMOGLOBIN: 13.4 g/dL (ref 13.0–17.0)
Hemoglobin: 13 g/dL (ref 13.0–17.0)
LYMPHS ABS: 0.7 10*3/uL (ref 0.7–4.0)
LYMPHS ABS: 0.7 10*3/uL (ref 0.7–4.0)
LYMPHS PCT: 11.5 % — AB (ref 12.0–46.0)
Lymphocytes Relative: 7 % — ABNORMAL LOW (ref 12–46)
MCH: 29.2 pg (ref 26.0–34.0)
MCHC: 32.8 g/dL (ref 30.0–36.0)
MCHC: 33.1 g/dL (ref 30.0–36.0)
MCV: 90.4 fl (ref 78.0–100.0)
MCV: 88.3 fL (ref 78.0–100.0)
MONO ABS: 0.7 10*3/uL (ref 0.1–1.0)
MONOS PCT: 8 % (ref 3–12)
Monocytes Absolute: 0.6 10*3/uL (ref 0.1–1.0)
Monocytes Relative: 9.3 % (ref 3.0–12.0)
NEUTROS ABS: 4.8 10*3/uL (ref 1.4–7.7)
Neutro Abs: 8.2 10*3/uL — ABNORMAL HIGH (ref 1.7–7.7)
Neutrophils Relative %: 78.5 % — ABNORMAL HIGH (ref 43.0–77.0)
Neutrophils Relative %: 84 % — ABNORMAL HIGH (ref 43–77)
Platelets: 278 10*3/uL (ref 150.0–400.0)
Platelets: 264 10*3/uL (ref 150–400)
RBC: 4.51 Mil/uL (ref 4.22–5.81)
RBC: 4.45 MIL/uL (ref 4.22–5.81)
RDW: 13.9 % (ref 11.5–15.5)
RDW: 13.2 % (ref 11.5–15.5)
WBC: 6.1 10*3/uL (ref 4.0–10.5)
WBC: 9.6 10*3/uL (ref 4.0–10.5)

## 2014-09-03 LAB — URINALYSIS, ROUTINE W REFLEX MICROSCOPIC
Bilirubin Urine: NEGATIVE
Glucose, UA: NEGATIVE mg/dL
Hgb urine dipstick: NEGATIVE
Ketones, ur: NEGATIVE mg/dL
Leukocytes, UA: NEGATIVE
NITRITE: NEGATIVE
Protein, ur: 30 mg/dL — AB
SPECIFIC GRAVITY, URINE: 1.013 (ref 1.005–1.030)
UROBILINOGEN UA: 0.2 mg/dL (ref 0.0–1.0)
pH: 5 (ref 5.0–8.0)

## 2014-09-03 LAB — COMPREHENSIVE METABOLIC PANEL
ALT: 17 U/L (ref 0–53)
ALT: 14 U/L (ref 0–53)
AST: 23 U/L (ref 0–37)
AST: 16 U/L (ref 0–37)
Albumin: 4.1 g/dL (ref 3.5–5.2)
Albumin: 3.9 g/dL (ref 3.5–5.2)
Alkaline Phosphatase: 57 U/L (ref 39–117)
Alkaline Phosphatase: 68 U/L (ref 39–117)
Anion gap: 13 (ref 5–15)
BILIRUBIN TOTAL: 0.6 mg/dL (ref 0.2–1.2)
BUN: 23 mg/dL (ref 6–23)
BUN: 24 mg/dL — ABNORMAL HIGH (ref 6–23)
CALCIUM: 9.2 mg/dL (ref 8.4–10.5)
CHLORIDE: 102 meq/L (ref 96–112)
CO2: 27 meq/L (ref 19–32)
CO2: 26 mEq/L (ref 19–32)
CREATININE: 1.1 mg/dL (ref 0.4–1.5)
CREATININE: 1.11 mg/dL (ref 0.50–1.35)
Calcium: 8.7 mg/dL (ref 8.4–10.5)
Chloride: 101 mEq/L (ref 96–112)
GFR calc non Af Amer: 69 mL/min — ABNORMAL LOW (ref >90)
GFR, EST AFRICAN AMERICAN: 80 mL/min — AB (ref >90)
GFR: 75.92 mL/min (ref >60.00)
GLUCOSE: 115 mg/dL — AB (ref 70–99)
Glucose, Bld: 204 mg/dL — ABNORMAL HIGH (ref 70–99)
Potassium: 4.1 mEq/L (ref 3.5–5.1)
Potassium: 4.7 mEq/L (ref 3.7–5.3)
Sodium: 138 mEq/L (ref 135–145)
Sodium: 140 mEq/L (ref 137–147)
TOTAL PROTEIN: 7.9 g/dL (ref 6.0–8.3)
Total Bilirubin: 0.9 mg/dL (ref 0.3–1.2)
Total Protein: 7.8 g/dL (ref 6.0–8.3)

## 2014-09-03 LAB — URINE MICROSCOPIC-ADD ON

## 2014-09-03 LAB — TROPONIN I: Troponin I: <0.3 ng/mL (ref <0.30)

## 2014-09-03 LAB — PRO B NATRIURETIC PEPTIDE: PRO B NATRI PEPTIDE: 3471 pg/mL — AB (ref 0–125)

## 2014-09-03 NOTE — Consult Note (Signed)
Patient ID: Mike Jimenez MRN: [id number], DOB/AGE: [birth year]   Admit date: 09/03/2014   Primary Physician: Unice Cobble, MD Primary Cardiologist: Darnell Level. Lovena Le, MD   Pt. Profile:  63 y/o male with a h/o NICM who presented to the ED today with presyncope.  Problem List  Past Medical History  Diagnosis Date  . Hyperlipidemia   . Hypertension   . Anemia   . NICM (nonischemic cardiomyopathy)     a. Dx 2011 - presented with sustained VT. Normal coronaries at that time, EF 10-15% by cath and 20-25% by echo. s/p biventricular ICD 2011.  . Gastric ulcer 2011  . Type II diabetes mellitus   . Ventricular tachycardia     a. Sustained VT 2011 s/p MDT [id number] Concert BiV ICD, ser# [id number] H.  . Asthma     a. Childhood asthma.  . Postsurgical hypothyroidism   . Thyroid cancer     a. 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 Dr Harlow Asa. s/p thyroidectomy 2013.  . Obesity   . Torn meniscus   . LBBB (left bundle branch block)     Past Surgical History  Procedure Laterality Date  . Ptx      traumatic, age 2 months  . Vasectomy    . Cardiac defibrillator placement  03/2010  . Thyroidectomy  12/10/2011    Procedure: TOTAL THYROIDECTOMY WITH CENTRAL COMPARTMENT DISSECTION;  Surgeon: Earnstine Regal, MD;  Location: [facility];  Service: General;  Laterality: N/A;  Total thyroidectomy with limited central compartment lymph node dissection.    Allergies  Allergies  Allergen Reactions  . Ramipril Cough   HPI  63 y/o male with the above complex problem list.  He has a h/o NICM with nl cors on cath in 2011 and is s/p MDT bi-V ICD in the setting of sustained VT in 2011.  Over the years, he has been reasonably well compensated and maintained on bb, arb, and low dose lasix @ home (20mg ).  He was seen by his PCP on 9/28 and reported a 7-10 day h/o orthopnea, DOE, and chest discomfort upon lying down.  His pBNP was > 2000 and CXR showed pulm vasc congestion.  He was advised to start taking  lasix 40mg  daily.  He did this and noted good output with drop in wt from 260 to 245.  He was seen by Melina Copa, PA in our office on 10/1 and was feeling much better.  Dyspnea and orthopnea had more or less resolved and he had no further chest discomfort.  His SBP was 130 and his losartan was increased from 25->50 mg daily.  An echo was scheduled for 10/9.  Ischemic eval was deferred given h/o nl cors on cath in 2011 and resolution of Ss.  He increased his losartan on 10/2 and felt lightheaded so decided to start taking it at night instead.  He did reasonably well over the weekend but says he didn't feel like doing very much b/c of the crummy weather.  Yesterday, he was @ work when he became very lightheaded, wk, and diaphoretic.  He left work and went to a prev scheduled doctor's appt.  By that time, he was already feeling better.  He had a low-grade fever of 99.7 and his BP was 138/87.  He refused ECG or CXR and mentioned that he had similar diaphoresis and lightheadedness with prior episode of bronchitis.  He was Rx omnicef.  CBC and BMET were unremarkable.  He felt well this AM but  then while @ work again became very lightheaded and diaphoretic.  He had no chest pain or dyspnea.  He sat and EMS was called.  They arrived within 10 mins of onset of Ss and BP was recorded in the 80's.  He continued to sit and Ss resolved within 30 mins.  He was taken to the ED for eval.  Here, his blood pressures have been stable.  His ICD was interrogated and did not show any arrhythmia.  Labs notable for pBNP of 3471 (was 2298 on 9/28), however CXR shows no active cardiopulm dzs.  He is currently asymptomatic.  Home Medications  Prior to Admission medications   Medication Sig Start Date End Date Taking? Authorizing Provider  aspirin EC 81 MG tablet Take 1 tablet (81 mg total) by mouth daily. 08/29/14  Yes Dayna N Dunn, PA-C  atorvastatin (LIPITOR) 40 MG tablet Take 40 mg by mouth at bedtime.   Yes Historical Provider,  MD  carvedilol (COREG) 6.25 MG tablet Take 6.25 mg by mouth 2 (two) times daily with a meal.  01/21/14  Yes Hendricks Limes, MD  cefdinir (OMNICEF) 300 MG capsule Take 1 capsule (300 mg total) by mouth 2 (two) times daily. 09/02/14  Yes Meriam Sprague Saguier, PA-C  furosemide (LASIX) 40 MG tablet Take 1 tablet (40 mg total) by mouth daily. 08/29/14  Yes Dayna N Dunn, PA-C  levothyroxine (SYNTHROID, LEVOTHROID) 112 MCG tablet Take 224 mcg by mouth daily. Take 2 tablets daily   Yes Historical Provider, MD  losartan (COZAAR) 50 MG tablet Take 1 tablet (50 mg total) by mouth daily. 08/29/14  Yes Dayna N Dunn, PA-C  metFORMIN (GLUCOPHAGE) 1000 MG tablet Take 1,000 mg by mouth 2 (two) times daily with a meal.   Yes Historical Provider, MD  Multiple Vitamin (MULTIVITAMIN) tablet Take 1 tablet by mouth daily.     Yes Historical Provider, MD  Omega-3 Fatty Acids (FISH OIL) 1000 MG CAPS Take 1 capsule by mouth daily.   Yes Historical Provider, MD  POTASSIUM CHLORIDE PO (OTC) Take 1 capsule once a day   Yes Historical Provider, MD  saw palmetto 80 MG capsule Take 80 mg by mouth daily.    Yes Historical Provider, MD  tadalafil (CIALIS) 20 MG tablet Take 20 mg by mouth daily as needed for erectile dysfunction.   Yes Historical Provider, MD  vitamin E 400 UNIT capsule Take 400 Units by mouth daily.   Yes Historical Provider, MD  benzonatate (TESSALON) 100 MG capsule Take 1 capsule (100 mg total) by mouth 3 (three) times daily as needed for cough. 09/02/14   Meriam Sprague Saguier, PA-C    Family History  Family History  Problem Relation Age of Onset  . Diabetes Maternal Grandfather   . Diabetes Maternal Aunt   . Colon cancer Maternal Aunt   . Colon cancer Maternal Aunt   . Lung cancer Paternal Uncle   . Lung cancer Maternal Uncle   . Asthma Sister   . Atrial fibrillation Father   . Prostate cancer Neg Hx   . CAD Neg Hx   . Hypothyroidism Mother   . Hypothyroidism Daughter   . Hypertension Mother     alive @ 85.   Marland Kitchen Hypertension Father     alive @ 57.    Social History  History   Social History  . Marital Status: Married    Spouse Name: N/A    Number of Children: 2  . Years of Education: N/A  Occupational History  . Warehouse work    Social History Main Topics  . Smoking status: Former Smoker -- 4 years    Types: Cigarettes    Quit date: 11/29/1970  . Smokeless tobacco: Never Used     Comment: startes at age 9, 2.5 ppd - quit in 1972  . Alcohol Use: Yes     Comment: rare  . Drug Use: No  . Sexual Activity: Not on file   Other Topics Concern  . Not on file   Social History Narrative   Live w/ wife , daughter lives w/ them - in [place]   6 dogs, fish tank.   Unloads trucks for a living.   Does not routinely exercise.     Review of Systems General:  +++wkns and diaphoresis as outlined above.  No chills, fever.  Wt down since starting lasix.  Cardiovascular:  +++ chest pain while lying down a few wks ago - now resolved, +++ dyspnea on exertion, edema, and orthopnea - all now resolved.  No palpitations, paroxysmal nocturnal dyspnea. Dermatological: No rash, lesions/masses Respiratory: Occas non-productive cough, dyspnea - resolved. Urologic: No hematuria, dysuria Abdominal:   He has had some soft to semi-loose stools recently.  No nausea, vomiting, bright red blood per rectum, melena, or hematemesis Neurologic:  No visual changes, unilateral wkns, changes in mental status. All other systems reviewed and are otherwise negative except as noted above.  Physical Exam  Blood pressure 126/63, pulse 76, temperature 97.9 F (36.6 C), temperature source Oral, resp. rate 22, height 6\' 3"  (1.905 m), weight 242 lb (109.77 kg), SpO2 99.00%.  General: Pleasant, NAD Psych: Normal affect. Neuro: Alert and oriented X 3. Moves all extremities spontaneously. HEENT: Normal  Neck: Supple without bruits or JVD. Lungs:  Resp regular and unlabored, diminished bilat. Heart: RRR no s3, s4, or  murmurs. Abdomen: Soft, non-tender, non-distended, BS + x 4.  Extremities: No clubbing, cyanosis or edema. DP/PT/Radials 2+ and equal bilaterally.  Labs   Recent Labs  09/03/14 0958  TROPONINI <0.30   Lab Results  Component Value Date   WBC 9.6 09/03/2014   HGB 13.0 09/03/2014   HCT 39.3 09/03/2014   MCV 88.3 09/03/2014   PLT 264 09/03/2014     Recent Labs Lab 09/03/14 0958  NA 140  K 4.7  CL 101  CO2 26  BUN 24*  CREATININE 1.11  CALCIUM 9.2  PROT 7.9  BILITOT 0.9  ALKPHOS 68  ALT 14  AST 16  GLUCOSE 115*   Lab Results  Component Value Date   CHOL 121 06/20/2014   HDL 34.70* 06/20/2014   LDLCALC 71 06/20/2014   TRIG 77.0 06/20/2014   Radiology/Studies  Dg Chest 2 View  09/03/2014   CLINICAL DATA:  Chest pain, dizziness, shortness of Breath  EXAM: CHEST  2 VIEW  COMPARISON:  08/26/2014  FINDINGS: Borderline cardiomegaly. Three leads cardiac pacemaker is unchanged in position. No acute infiltrate or pleural effusion. No pulmonary edema. Stable mild degenerative changes thoracic spine.  IMPRESSION: No active cardiopulmonary disease.   Electronically Signed   By: Lahoma Crocker M.D.   On: 09/03/2014 10:33   ECG  A sensed/V paced, 78  ASSESSMENT AND PLAN  1.  Presyncope/Hypotension:  Pt presented to the ED following his third episode of lightheadedness and diaphoresis since his losartan dosage was increased to 50mg  daily and lasix was increased to 40mg  daily.  He was found to be hypotensive by EMS with systolics reportedly in the 80's  but BP here has been stable - most recent recording 109.  Hold losartan tonight and plan to resume tomorrow @ 25mg  daily.  Device interrogation did not show any significant arrhythmias or therapies.  2.  NICM:  He recently developed volume overload with DOE, orthopnea, and chest discomfort while lying down.  This resolved with diuresis.  His wt is down from 260 on 9/28 to about 241 on his home scale.  Though his pBNP is elevated, he does not  have evidence of significant volume overload on exam today and CXR did not show evidence of CHF.  Cont bb.  Hold losartan today and resume tomorrow @ 25mg  daily.  Suspect he is somewhat volume depleted (bun/creat slightly elevated above prior baseline).  Will reduce lasix to 20mg  daily starting tomorrow.  He may ultimately require a sliding scale on top of that for wt gain.  F/U echo on Friday.  F/U with Melina Copa, PA next wk as scheduled.  3.  HTN:  Changes as above.  4.  HL: Cont statin.  5.  DM:  Cont metformin.  6.  Dispo:  Ok to d/c home from the ED with changes as above.  Signed, Murray Hodgkins, NP 09/03/2014, 2:46 PM   I have seen and evaluated the patient this PM along with Kerin Ransom, PA. I agree with His findings, examination as well as impression recommendations.  Mr. Buchberger has a long-standing cardiomyopathy and recently increased his diuretic dose for clear volume overload. He is now diuresed for below what his previous dry weight had been.Marland Kitchen He presented now hypotension and near syncope having also started a higher dose of ARB. He does have a history of ICD that was interrogated and did not show any arrhythmias.  At this point I think it is clear that he probably is somewhat volume repleted. Moderate rotation B. to liberalize his fluids today and hold his ARB tonight. Continue beta blocker. Restart ARB at original 25 mg dose.  I think his dry weight is probably about 2-3 pounds above what it is his true dry weight. He can probably restart his diuretic after tomorrow at 20 mg daily. He would then use a sliding scale Lasix based on the new dry weight.  He has a followup appointment scheduled for next Friday an echocardiogram this week.  Leonie Man, M.D., M.S. Interventional Cardiologist   Pager # [phone]

## 2014-09-03 NOTE — Discharge Instructions (Signed)
Take half the normal dose of the losartan and Lasix. Followup for your echo on Friday and in the office next week as scheduled.  Near-Syncope Near-syncope (commonly known as near fainting) is sudden weakness, dizziness, or feeling like you might pass out. During an episode of near-syncope, you may also develop pale skin, have tunnel vision, or feel sick to your stomach (nauseous). Near-syncope may occur when getting up after sitting or while standing for a long time. It is caused by a sudden decrease in blood flow to the brain. This decrease can result from various causes or triggers, most of which are not serious. However, because near-syncope can sometimes be a sign of something serious, a medical evaluation is required. The specific cause is often not determined. HOME CARE INSTRUCTIONS  Monitor your condition for any changes. The following actions may help to alleviate any discomfort you are experiencing:  Have someone stay with you until you feel stable.  Lie down right away and prop your feet up if you start feeling like you might faint. Breathe deeply and steadily. Wait until all the symptoms have passed. Most of these episodes last only a few minutes. You may feel tired for several hours.   Drink enough fluids to keep your urine clear or pale yellow.   If you are taking blood pressure or heart medicine, get up slowly when seated or lying down. Take several minutes to sit and then stand. This can reduce dizziness.  Follow up with your health care provider as directed. SEEK IMMEDIATE MEDICAL CARE IF:   You have a severe headache.   You have unusual pain in the chest, abdomen, or back.   You are bleeding from the mouth or rectum, or you have black or tarry stool.   You have an irregular or very fast heartbeat.   You have repeated fainting or have seizure-like jerking during an episode.   You faint when sitting or lying down.   You have confusion.   You have difficulty  walking.   You have severe weakness.   You have vision problems.  MAKE SURE YOU:   Understand these instructions.  Will watch your condition.  Will get help right away if you are not doing well or get worse. Document Released: 11/15/2005 Document Revised: 11/20/2013 Document Reviewed: 04/20/2013 [facility] Patient Information 2015 [place], [place]. This information is not intended to replace advice given to you by your health care provider. Make sure you discuss any questions you have with your health care provider.

## 2014-09-03 NOTE — ED Notes (Signed)
Agricultural consultant completed [employer] interrogation.

## 2014-09-03 NOTE — ED Provider Notes (Signed)
CSN: [id number]     Arrival date & time 09/03/14  8338 History   First MD Initiated Contact with Patient 09/03/14 0930     Chief Complaint  Patient presents with  . Near Syncope     (Consider location/radiation/quality/duration/timing/severity/associated sxs/prior Treatment) HPI Comments: Patient presents to the ER for evaluation after near syncope. Patient reports that he has not been feeling well for several weeks. Patient reports that he was seen by his primary doctor on September 28 for worsening shortness of breath and dyspnea on exertion. Patient was started on Lasix. He reports that he does have a history of on Lasix in the past for congestive heart failure, but he stopped it because he was getting dehydrated at work. Patient reports that he lost approximately 15 pounds with the addition of Lasix. He followed up with his cardiologist several days later and had her Lasix increased. Patient reports several days later he had a near typical episode at work. He was seen by the primary doctor again and was prescribed antibiotics and Tessalon Perles for cough and chest congestion. Patient is still not feeling well. He went to work today and felt weak, dizzy, like he was going to pass out. Blood pressure was taken and was 80 systolic. No loss of consciousness. Has not had any chest pain. He continues to feel short of breath.  Patient is a 63 y.o. male presenting with near-syncope.  Near Syncope Associated symptoms include shortness of breath. Pertinent negatives include no chest pain and no headaches.    Past Medical History  Diagnosis Date  . Hyperlipidemia   . Hypertension   . Anemia   . NICM (nonischemic cardiomyopathy)     a. Dx 2011 - presented with sustained VT. Normal coronaries at that time, EF 10-15% by cath and 20-25% by echo. s/p biventricular ICD 2011.  . Gastric ulcer 2011  . Ventricular tachycardia     a. Sustained VT 2011 s/p MDT [id number] Concert BiV ICD, ser# [id number] H.  .  Postsurgical hypothyroidism   . Obesity   . Torn meniscus   . LBBB (left bundle branch block)   . Automatic implantable cardioverter-defibrillator in situ   . CHF (congestive heart failure)   . Type II diabetes mellitus   . Childhood asthma   . GERD (gastroesophageal reflux disease)   . Arthritis     "knees" (09/05/2014)  . History of gout   . Thyroid cancer     a. 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 Dr Harlow Asa. s/p thyroidectomy 2013.   Past Surgical History  Procedure Laterality Date  . Ptx      traumatic, age 18 months  . Vasectomy    . Bi-ventricular implantable cardioverter defibrillator  (crt-d)  03/2010  . Thyroidectomy  12/10/2011    Procedure: TOTAL THYROIDECTOMY WITH CENTRAL COMPARTMENT DISSECTION;  Surgeon: Earnstine Regal, MD;  Location: [place];  Service: General;  Laterality: N/A;  Total thyroidectomy with limited central compartment lymph node dissection.  . Cardiac catheterization  03/2010   Family History  Problem Relation Age of Onset  . Diabetes Maternal Grandfather   . Diabetes Maternal Aunt   . Colon cancer Maternal Aunt   . Colon cancer Maternal Aunt   . Lung cancer Paternal Uncle   . Lung cancer Maternal Uncle   . Asthma Sister   . Atrial fibrillation Father   . Prostate cancer Neg Hx   . CAD Neg Hx   . Hypothyroidism Mother   . Hypothyroidism Daughter   .  Hypertension Mother     alive @ 83.  Marland Kitchen Hypertension Father     alive @ 75.   History  Substance Use Topics  . Smoking status: Former Smoker -- 2.50 packs/day for 2 years    Types: Cigarettes    Quit date: 11/29/1970  . Smokeless tobacco: Former Systems developer    Types: Chew  . Alcohol Use: Yes     Comment: 09/05/2014 "2 drinks q couple months, if that    Review of Systems  Respiratory: Positive for shortness of breath.   Cardiovascular: Positive for near-syncope. Negative for chest pain.  Neurological: Positive for dizziness and weakness. Negative for numbness and headaches.  All other  systems reviewed and are negative.     Allergies  Ramipril  Home Medications   Prior to Admission medications   Medication Sig Start Date End Date Taking? Authorizing Provider  aspirin EC 81 MG tablet Take 1 tablet (81 mg total) by mouth daily. 08/29/14  Yes Dayna N Dunn, PA-C  atorvastatin (LIPITOR) 40 MG tablet Take 40 mg by mouth at bedtime.   Yes Historical Provider, MD  carvedilol (COREG) 6.25 MG tablet Take 6.25 mg by mouth 2 (two) times daily with a meal.  01/21/14  Yes Hendricks Limes, MD  cefdinir (OMNICEF) 300 MG capsule Take 1 capsule (300 mg total) by mouth 2 (two) times daily. 09/02/14  Yes Meriam Sprague Saguier, PA-C  levothyroxine (SYNTHROID, LEVOTHROID) 112 MCG tablet Take 224 mcg by mouth daily. Take 2 tablets daily   Yes Historical Provider, MD  metFORMIN (GLUCOPHAGE) 1000 MG tablet Take 1,000 mg by mouth 2 (two) times daily with a meal.   Yes Historical Provider, MD  Multiple Vitamin (MULTIVITAMIN) tablet Take 1 tablet by mouth daily.     Yes Historical Provider, MD  Omega-3 Fatty Acids (FISH OIL) 1000 MG CAPS Take 1 capsule by mouth daily.   Yes Historical Provider, MD  POTASSIUM CHLORIDE PO (OTC) Take 1 capsule once a day   Yes Historical Provider, MD  saw palmetto 80 MG capsule Take 80 mg by mouth daily.    Yes Historical Provider, MD  tadalafil (CIALIS) 20 MG tablet Take 20 mg by mouth daily as needed for erectile dysfunction.   Yes Historical Provider, MD  vitamin E 400 UNIT capsule Take 400 Units by mouth daily.   Yes Historical Provider, MD  benzonatate (TESSALON) 100 MG capsule Take 1 capsule (100 mg total) by mouth 3 (three) times daily as needed for cough. 09/02/14   Meriam Sprague Saguier, PA-C  furosemide (LASIX) 40 MG tablet Take 20 mg by mouth daily. Changed dosage 09/03/2014 per ED MD 08/29/14   Nedra Hai Dunn, PA-C  losartan (COZAAR) 25 MG tablet Take 1 tablet (25 mg total) by mouth daily. 09/05/14   Rogelia Mire, NP   BP 116/54  Pulse 91  Temp(Src) 97.9 F (36.6  C) (Oral)  Resp 20  Ht 6\' 3"  (1.905 m)  Wt 242 lb (109.77 kg)  BMI 30.25 kg/m2  SpO2 97% Physical Exam  Constitutional: He is oriented to person, place, and time. He appears well-developed and well-nourished. No distress.  HENT:  Head: Normocephalic and atraumatic.  Right Ear: Hearing normal.  Left Ear: Hearing normal.  Nose: Nose normal.  Mouth/Throat: Oropharynx is clear and moist and mucous membranes are normal.  Eyes: Conjunctivae and EOM are normal. Pupils are equal, round, and reactive to light.  Neck: Normal range of motion. Neck supple.  Cardiovascular: Regular rhythm, S1 normal  and S2 normal.  Exam reveals no gallop and no friction rub.   No murmur heard. Pulmonary/Chest: Effort normal and breath sounds normal. No respiratory distress. He exhibits no tenderness.  Abdominal: Soft. Normal appearance and bowel sounds are normal. There is no hepatosplenomegaly. There is no tenderness. There is no rebound, no guarding, no tenderness at McBurney's point and negative Murphy's sign. No hernia.  Musculoskeletal: Normal range of motion. He exhibits edema (trace).  Neurological: He is alert and oriented to person, place, and time. He has normal strength. No cranial nerve deficit or sensory deficit. Coordination normal. GCS eye subscore is 4. GCS verbal subscore is 5. GCS motor subscore is 6.  Skin: Skin is warm, dry and intact. No rash noted. No cyanosis.  Psychiatric: He has a normal mood and affect. His speech is normal and behavior is normal. Thought content normal.    ED Course  Procedures (including critical care time) Labs Review Labs Reviewed  CBC WITH DIFFERENTIAL - Abnormal; Notable for the following:    Neutrophils Relative % 84 (*)    Neutro Abs 8.2 (*)    Lymphocytes Relative 7 (*)    All other components within normal limits  COMPREHENSIVE METABOLIC PANEL - Abnormal; Notable for the following:    Glucose, Bld 115 (*)    BUN 24 (*)    GFR calc non Af Amer 69 (*)     GFR calc Af Amer 80 (*)    All other components within normal limits  URINALYSIS, ROUTINE W REFLEX MICROSCOPIC - Abnormal; Notable for the following:    Protein, ur 30 (*)    All other components within normal limits  PRO B NATRIURETIC PEPTIDE - Abnormal; Notable for the following:    Pro B Natriuretic peptide (BNP) 3471.0 (*)    All other components within normal limits  URINE MICROSCOPIC-ADD ON - Abnormal; Notable for the following:    Casts HYALINE CASTS (*)    All other components within normal limits  TROPONIN I    Imaging Review Dg Chest 2 View  09/05/2014   CLINICAL DATA:  Hypertension.  Weakness.  EXAM: CHEST  2 VIEW  COMPARISON:  September 03, 2014.  FINDINGS: The heart size and mediastinal contours are within normal limits. Both lungs are clear. Left-sided pacemaker is unchanged in position. No pneumothorax or pleural effusion is noted. The visualized skeletal structures are unremarkable.  IMPRESSION: No acute cardiopulmonary abnormality seen.   Electronically Signed   By: Sabino Dick M.D.   On: 09/05/2014 13:30     EKG Interpretation   Date/Time:  Tuesday September 03 2014 09:25:59 EDT Ventricular Rate:  78 PR Interval:  139 QRS Duration: 154 QT Interval:  450 QTC Calculation: 513 R Axis:   -98 Text Interpretation:  atrial-sensed ventricular-paced complexes Confirmed  by Betsey Holiday  MD, Machel Violante [phone]) on 09/03/2014 9:31:25 AM      MDM   Final diagnoses:  Near syncope   Patient presents to the ER for evaluation of near syncope. Patient has had a complicated recent medical history. Patient does have a history of congestive heart failure. He had previously stopped his Lasix. He saw his doctor 2 weeks ago for shortness of breath and had Lasix restarted. He had followed up with cardiology and had Lasix increased. He has had 2 episodes now in the last week where he nearly passed out. He did have documented low blood pressure prior to coming to the ER this morning.  Patient  has not had any  chest pain. He has not felt any defibrillator shocks. Vital signs are stable here in the ER. Patient continues to appear dyspneic here in the ER, although not hypoxic. Cardiology was consulted to evaluate the patient. Who felt that the patient was safe for discharge. He recommended decreasing his medications including Lasix and losartan. He has been scheduled for outpatient echo and office followup.   Orpah Greek, MD 09/06/14 [phone]

## 2014-09-03 NOTE — ED Notes (Signed)
EMS - Pt coming from work with c/o of a near syncopal episode, BP was 80/60.  Pt states he has not feeling good for the past 3 weeks, MD changed B/P medication and increased Lasix dosage to 50mg .  Pt states he was place on antibiotics yesterday and is not really sure but believes it is associated with bronchitis.  Pt has a defibrillator.  Pt denies Nausea and vomiting but has had diarrhea (4 x in the past 24 hours).

## 2014-09-03 NOTE — ED Notes (Signed)
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

## 2014-09-03 NOTE — ED Notes (Signed)
RN Charge [place] asked by this RN to interrogate

## 2014-09-05 ENCOUNTER — Other Ambulatory Visit: Payer: Self-pay | Admitting: Nurse Practitioner

## 2014-09-05 ENCOUNTER — Inpatient Hospital Stay (HOSPITAL_COMMUNITY)
Admission: EM | Admit: 2014-09-05 | Discharge: 2014-09-07 | DRG: 287 | Disposition: A | Discharge status: Home / Self Care | Payer: 59 | Attending: Cardiovascular Disease | Admitting: Cardiovascular Disease

## 2014-09-05 ENCOUNTER — Ambulatory Visit (INDEPENDENT_AMBULATORY_CARE_PROVIDER_SITE_OTHER): Payer: 59 | Admitting: Family Medicine

## 2014-09-05 ENCOUNTER — Encounter (HOSPITAL_COMMUNITY): Payer: Self-pay | Admitting: Emergency Medicine

## 2014-09-05 ENCOUNTER — Emergency Department (HOSPITAL_COMMUNITY): Payer: 59

## 2014-09-05 VITALS — BP 92/58 | HR 62 | Temp 97.9°F | Resp 18 | Ht 73.25 in | Wt 239.0 lb

## 2014-09-05 DIAGNOSIS — R072 Precordial pain: Secondary | ICD-10-CM

## 2014-09-05 DIAGNOSIS — Z85850 Personal history of malignant neoplasm of thyroid: Secondary | ICD-10-CM | POA: Diagnosis not present

## 2014-09-05 DIAGNOSIS — E669 Obesity, unspecified: Secondary | ICD-10-CM | POA: Diagnosis present

## 2014-09-05 DIAGNOSIS — R079 Chest pain, unspecified: Secondary | ICD-10-CM

## 2014-09-05 DIAGNOSIS — R0789 Other chest pain: Secondary | ICD-10-CM | POA: Diagnosis present

## 2014-09-05 DIAGNOSIS — Z6831 Body mass index (BMI) 31.0-31.9, adult: Secondary | ICD-10-CM

## 2014-09-05 DIAGNOSIS — K219 Gastro-esophageal reflux disease without esophagitis: Secondary | ICD-10-CM

## 2014-09-05 DIAGNOSIS — I447 Left bundle-branch block, unspecified: Secondary | ICD-10-CM

## 2014-09-05 DIAGNOSIS — M25519 Pain in unspecified shoulder: Secondary | ICD-10-CM

## 2014-09-05 DIAGNOSIS — I5022 Chronic systolic (congestive) heart failure: Secondary | ICD-10-CM | POA: Diagnosis present

## 2014-09-05 DIAGNOSIS — Z79899 Other long term (current) drug therapy: Secondary | ICD-10-CM

## 2014-09-05 DIAGNOSIS — I428 Other cardiomyopathies: Secondary | ICD-10-CM | POA: Diagnosis present

## 2014-09-05 DIAGNOSIS — E869 Volume depletion, unspecified: Secondary | ICD-10-CM | POA: Diagnosis present

## 2014-09-05 DIAGNOSIS — E890 Postprocedural hypothyroidism: Secondary | ICD-10-CM | POA: Diagnosis present

## 2014-09-05 DIAGNOSIS — I10 Essential (primary) hypertension: Secondary | ICD-10-CM

## 2014-09-05 DIAGNOSIS — Z7982 Long term (current) use of aspirin: Secondary | ICD-10-CM | POA: Diagnosis not present

## 2014-09-05 DIAGNOSIS — I493 Ventricular premature depolarization: Secondary | ICD-10-CM

## 2014-09-05 DIAGNOSIS — I472 Ventricular tachycardia, unspecified: Secondary | ICD-10-CM

## 2014-09-05 DIAGNOSIS — I959 Hypotension, unspecified: Secondary | ICD-10-CM | POA: Diagnosis present

## 2014-09-05 DIAGNOSIS — I2510 Atherosclerotic heart disease of native coronary artery without angina pectoris: Secondary | ICD-10-CM | POA: Diagnosis present

## 2014-09-05 DIAGNOSIS — R55 Syncope and collapse: Secondary | ICD-10-CM | POA: Diagnosis present

## 2014-09-05 DIAGNOSIS — I509 Heart failure, unspecified: Secondary | ICD-10-CM

## 2014-09-05 DIAGNOSIS — Z95810 Presence of automatic (implantable) cardiac defibrillator: Secondary | ICD-10-CM

## 2014-09-05 DIAGNOSIS — R490 Dysphonia: Secondary | ICD-10-CM

## 2014-09-05 DIAGNOSIS — E785 Hyperlipidemia, unspecified: Secondary | ICD-10-CM | POA: Diagnosis present

## 2014-09-05 DIAGNOSIS — I952 Hypotension due to drugs: Secondary | ICD-10-CM

## 2014-09-05 DIAGNOSIS — E119 Type 2 diabetes mellitus without complications: Secondary | ICD-10-CM | POA: Diagnosis present

## 2014-09-05 DIAGNOSIS — R531 Weakness: Secondary | ICD-10-CM

## 2014-09-05 DIAGNOSIS — R110 Nausea: Secondary | ICD-10-CM

## 2014-09-05 DIAGNOSIS — Z87891 Personal history of nicotine dependence: Secondary | ICD-10-CM

## 2014-09-05 DIAGNOSIS — E860 Dehydration: Secondary | ICD-10-CM

## 2014-09-05 DIAGNOSIS — C73 Malignant neoplasm of thyroid gland: Secondary | ICD-10-CM

## 2014-09-05 DIAGNOSIS — I429 Cardiomyopathy, unspecified: Secondary | ICD-10-CM

## 2014-09-05 DIAGNOSIS — R5383 Other fatigue: Secondary | ICD-10-CM

## 2014-09-05 HISTORY — DX: Unspecified osteoarthritis, unspecified site: M19.90

## 2014-09-05 HISTORY — DX: Gastro-esophageal reflux disease without esophagitis: K21.9

## 2014-09-05 HISTORY — DX: Heart failure, unspecified: I50.9

## 2014-09-05 HISTORY — DX: Presence of automatic (implantable) cardiac defibrillator: Z95.810

## 2014-09-05 HISTORY — DX: Personal history of other diseases of the musculoskeletal system and connective tissue: Z87.39

## 2014-09-05 HISTORY — DX: Unspecified asthma, uncomplicated: J45.909

## 2014-09-05 LAB — BASIC METABOLIC PANEL
Anion gap: 14 (ref 5–15)
BUN: 24 mg/dL — AB (ref 6–23)
CALCIUM: 9.1 mg/dL (ref 8.4–10.5)
CO2: 24 meq/L (ref 19–32)
CREATININE: 1.06 mg/dL (ref 0.50–1.35)
Chloride: 102 mEq/L (ref 96–112)
GFR calc Af Amer: 85 mL/min — ABNORMAL LOW (ref >90)
GFR calc non Af Amer: 73 mL/min — ABNORMAL LOW (ref >90)
Glucose, Bld: 185 mg/dL — ABNORMAL HIGH (ref 70–99)
Potassium: 4.6 mEq/L (ref 3.7–5.3)
Sodium: 140 mEq/L (ref 137–147)

## 2014-09-05 LAB — CBC
HEMATOCRIT: 39.1 % (ref 39.0–52.0)
Hemoglobin: 12.7 g/dL — ABNORMAL LOW (ref 13.0–17.0)
MCH: 28.7 pg (ref 26.0–34.0)
MCHC: 32.5 g/dL (ref 30.0–36.0)
MCV: 88.3 fL (ref 78.0–100.0)
PLATELETS: 272 10*3/uL (ref 150–400)
RBC: 4.43 MIL/uL (ref 4.22–5.81)
RDW: 13 % (ref 11.5–15.5)
WBC: 6.5 10*3/uL (ref 4.0–10.5)

## 2014-09-05 LAB — TROPONIN I

## 2014-09-05 LAB — GLUCOSE, CAPILLARY
Glucose-Capillary: 136 mg/dL — ABNORMAL HIGH (ref 70–99)
Glucose-Capillary: 96 mg/dL (ref 70–99)

## 2014-09-05 LAB — PRO B NATRIURETIC PEPTIDE: PRO B NATRI PEPTIDE: 2587 pg/mL — AB (ref 0–125)

## 2014-09-05 LAB — I-STAT TROPONIN, ED: Troponin i, poc: 0 ng/mL (ref 0.00–0.08)

## 2014-09-05 LAB — GLUCOSE, POCT (MANUAL RESULT ENTRY): POC Glucose: 105 mg/dl — AB (ref 70–99)

## 2014-09-05 MED ORDER — SODIUM CHLORIDE 0.9 % IJ SOLN
3.0000 mL | Freq: Two times a day (BID) | INTRAMUSCULAR | Status: DC
  Administered 2014-09-05: 3 mL via INTRAVENOUS

## 2014-09-05 MED ORDER — ONDANSETRON HCL 4 MG/2ML IJ SOLN: 4.0000 mg | Freq: Four times a day (QID) | INTRAMUSCULAR | Status: DC | PRN

## 2014-09-05 MED ORDER — BENZONATATE 100 MG PO CAPS
100.0000 mg | ORAL_CAPSULE | Freq: Three times a day (TID) | ORAL | Status: DC | PRN
  Filled 2014-09-05: qty 1

## 2014-09-05 MED ORDER — ASPIRIN 81 MG PO CHEW
81.0000 mg | CHEWABLE_TABLET | ORAL | Status: AC
  Administered 2014-09-06: 81 mg via ORAL
  Filled 2014-09-05 (×2): qty 1

## 2014-09-05 MED ORDER — ACETAMINOPHEN 325 MG PO TABS: 650.0000 mg | ORAL_TABLET | ORAL | Status: DC | PRN

## 2014-09-05 MED ORDER — NITROGLYCERIN 0.4 MG SL SUBL: 0.4000 mg | SUBLINGUAL_TABLET | SUBLINGUAL | Status: DC | PRN

## 2014-09-05 MED ORDER — INSULIN ASPART 100 UNIT/ML ~~LOC~~ SOLN
0.0000 [IU] | Freq: Three times a day (TID) | SUBCUTANEOUS | Status: DC
  Administered 2014-09-05: 2 [IU] via SUBCUTANEOUS
  Administered 2014-09-06: 3 [IU] via SUBCUTANEOUS
  Administered 2014-09-06: 2 [IU] via SUBCUTANEOUS

## 2014-09-05 MED ORDER — LEVOTHYROXINE SODIUM 112 MCG PO TABS
224.0000 ug | ORAL_TABLET | Freq: Every day | ORAL | Status: DC
  Filled 2014-09-05 (×2): qty 2

## 2014-09-05 MED ORDER — LOSARTAN POTASSIUM 25 MG PO TABS: 25.0000 mg | ORAL_TABLET | Freq: Every day | ORAL | Status: DC

## 2014-09-05 MED ORDER — SODIUM CHLORIDE 0.9 % IV SOLN
INTRAVENOUS | Status: AC
  Administered 2014-09-05: 17:00:00 via INTRAVENOUS

## 2014-09-05 MED ORDER — SODIUM CHLORIDE 0.9 % IJ SOLN: 3.0000 mL | INTRAMUSCULAR | Status: DC | PRN

## 2014-09-05 MED ORDER — CARVEDILOL 6.25 MG PO TABS
6.2500 mg | ORAL_TABLET | Freq: Two times a day (BID) | ORAL | Status: DC
  Administered 2014-09-05: 6.25 mg via ORAL
  Filled 2014-09-05 (×5): qty 1

## 2014-09-05 MED ORDER — GI COCKTAIL ~~LOC~~
30.0000 mL | Freq: Once | ORAL | Status: AC
  Administered 2014-09-05: 30 mL via ORAL
  Filled 2014-09-05: qty 30

## 2014-09-05 MED ORDER — ASPIRIN EC 81 MG PO TBEC
81.0000 mg | DELAYED_RELEASE_TABLET | Freq: Every day | ORAL | Status: DC
  Administered 2014-09-05: 81 mg via ORAL
  Filled 2014-09-05 (×3): qty 1

## 2014-09-05 MED ORDER — SODIUM CHLORIDE 0.9 % IV SOLN: 250.0000 mL | INTRAVENOUS | Status: DC | PRN

## 2014-09-05 MED ORDER — ATORVASTATIN CALCIUM 40 MG PO TABS
40.0000 mg | ORAL_TABLET | Freq: Every day | ORAL | Status: DC
  Administered 2014-09-05: 40 mg via ORAL
  Filled 2014-09-05 (×2): qty 1

## 2014-09-05 NOTE — H&P (Addendum)
Patient ID: SKYLOR SCHNAPP MRN: [id number] DOB/AGE: 62-Jan-1952 62 y.o. Admit date: 09/05/2014  Primary Care Physician: [name] Primary Cardiologist: Lovena Le  HPI: 63 y/o male with history of NICM, BiV ICD in place, HTN, HLD, DM II, VT, chronic systolic CHF to ED from primary care care office where he presented this am with c/o nausea, diaphoresis, weakness and chest discomfort. He has a h/o NICM with nl cors on cath in 2011 and is s/p MDT bi-V ICD in the setting of sustained VT in 2011. Over the years, he has been reasonably well compensated and maintained on beta blocker, ARB and low dose lasix. He was seen by his PCP on 9/28 and reported a 7-10 day h/o orthopnea, DOE, and chest discomfort upon lying down. His pBNP was > 2000 and CXR showed pulmonary vascular congestion. He was advised to start taking lasix 40mg  daily. He did this and noted good output with drop in wt from 260 to 245. He was seen by Melina Copa, PA in our office on 08/29/14 and was feeling much better. Dyspnea and orthopnea had more or less resolved and he had no further chest discomfort. His SBP was 121mmHg and his losartan was increased from 25->50 mg daily. Ischemic eval was deferred given h/o normal coronaries by cath in 2011. He increased his losartan on 08/30/14 and felt lightheaded so decided to start taking it at night instead. He began to have symptoms of weakness, dizziness and diaphoresis and was seen in primary care. He had a low-grade fever of 99.7 and his BP was 138/87. He refused ECG or CXR and mentioned that he had similar diaphoresis and lightheadedness with prior episode of bronchitis. He was treated with omnicef. CBC and BMET were unremarkable during that visit. He then had recurrence of symptoms and was seen in the ED at [facility] by Dr. Ellyn Hack 09/03/14. SBP in the 80s. His symptoms quickly resolved. Blood pressure was stable in the ED. His ICD was interrogated and did not show any arrhythmia. pBNP of 3471 on 09/03/14 (was 2298 on  9/28), however CXR shows no active cardiopulmonary disease. He was felt to be volume depleted and his Losartan was reduced to 25 mg daily, Lasix was reduced to 20 mg daily.   This am had recurrence of symptoms and BP at home in the 80s. He was seen in the [facility] by Dr. Carlota Raspberry and then sent to the ED for further evaluation and management. He describes nausea, diaphoresis and generalized weakness over the past few days. This am woke up with chest pain over the left chest wall. Unable to eat breakfast due to diaphoresis, weakness and chest pressure. No pain in chest wall with palpation. Felt like a fullness. Notes recent chest pressure and dyspnea with exertion.  He took Losartan 25 mg last night and Lasix 20 mg this am.   Review of systems complete and found to be negative unless listed above   Past Medical History  Diagnosis Date  . Hyperlipidemia   . Hypertension   . Anemia   . NICM (nonischemic cardiomyopathy)     a. Dx 2011 - presented with sustained VT. Normal coronaries at that time, EF 10-15% by cath and 20-25% by echo. s/p biventricular ICD 2011.  . Gastric ulcer 2011  . Type II diabetes mellitus   . Ventricular tachycardia     a. Sustained VT 2011 s/p MDT [id number] Concert BiV ICD, ser# [id number] H.  . Asthma  a. Childhood asthma.  . Postsurgical hypothyroidism   . Thyroid cancer     a. 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 Dr Harlow Asa. s/p thyroidectomy 2013.  . Obesity   . Torn meniscus   . LBBB (left bundle branch block)     Family History  Problem Relation Age of Onset  . Diabetes Maternal Grandfather   . Diabetes Maternal Aunt   . Colon cancer Maternal Aunt   . Colon cancer Maternal Aunt   . Lung cancer Paternal Uncle   . Lung cancer Maternal Uncle   . Asthma Sister   . Atrial fibrillation Father   . Prostate cancer Neg Hx   . CAD Neg Hx   . Hypothyroidism Mother   . Hypothyroidism Daughter   . Hypertension Mother     alive @  98.  Marland Kitchen Hypertension Father     alive @ 39.    History   Social History  . Marital Status: Married    Spouse Name: N/A    Number of Children: 2  . Years of Education: N/A   Occupational History  . Warehouse work    Social History Main Topics  . Smoking status: Former Smoker -- 4 years    Types: Cigarettes    Quit date: 11/29/1970  . Smokeless tobacco: Never Used     Comment: startes at age 70, 2.5 ppd - quit in 1972  . Alcohol Use: Yes     Comment: rare  . Drug Use: No  . Sexual Activity: Not on file   Other Topics Concern  . Not on file   Social History Narrative   Live w/ wife , daughter lives w/ them - in [place]   6 dogs, fish tank.   Unloads trucks for a living.   Does not routinely exercise.    Past Surgical History  Procedure Laterality Date  . Ptx      traumatic, age 10 months  . Vasectomy    . Cardiac defibrillator placement  03/2010  . Thyroidectomy  12/10/2011    Procedure: TOTAL THYROIDECTOMY WITH CENTRAL COMPARTMENT DISSECTION;  Surgeon: Earnstine Regal, MD;  Location: [place];  Service: General;  Laterality: N/A;  Total thyroidectomy with limited central compartment lymph node dissection.    Allergies  Allergen Reactions  . Ramipril Cough    Prior to Admission Meds:  Current outpatient prescriptions: aspirin EC 81 MG tablet, Take 1 tablet (81 mg total) by mouth daily., Disp: , Rfl: ;   atorvastatin (LIPITOR) 40 MG tablet, Take 40 mg by mouth at bedtime., Disp: , Rfl: ;   carvedilol (COREG) 6.25 MG tablet, Take 6.25 mg by mouth 2 (two) times daily with a meal. , Disp: , Rfl: ;   cefdinir (OMNICEF) 300 MG capsule, Take 1 capsule (300 mg total) by mouth 2 (two) times daily., Disp: 20 capsule, Rfl: 0 furosemide (LASIX) 40 MG tablet, Take 20 mg by mouth daily. Changed dosage 09/03/2014 per ED MD, Disp: , Rfl: ;  levothyroxine (SYNTHROID, LEVOTHROID) 112 MCG tablet, Take 224 mcg by mouth daily. Take 2 tablets daily, Disp: , Rfl: ;   losartan (COZAAR) 25 MG tablet,  Take 1 tablet (25 mg total) by mouth daily., Disp: 30 tablet, Rfl: 6;   metFORMIN (GLUCOPHAGE) 1000 MG tablet, Take 1,000 mg by mouth 2 (two) times daily with a meal., Disp: , Rfl:  Multiple Vitamin (MULTIVITAMIN) tablet, Take 1 tablet by mouth daily.  , Disp: , Rfl: ;   Omega-3 Fatty  Acids (FISH OIL) 1000 MG CAPS, Take 1 capsule by mouth daily., Disp: , Rfl: ;   POTASSIUM CHLORIDE PO, (OTC) Take 1 capsule once a day, Disp: , Rfl: ;   saw palmetto 80 MG capsule, Take 80 mg by mouth daily. , Disp: , Rfl: ;  tadalafil (CIALIS) 20 MG tablet, Take 20 mg by mouth daily as needed for erectile dysfunction., Disp: , Rfl:  vitamin E 400 UNIT capsule, Take 400 Units by mouth daily., Disp: , Rfl: ;   benzonatate (TESSALON) 100 MG capsule, Take 1 capsule (100 mg total) by mouth 3 (three) times daily as needed for cough., Disp: 21 capsule, Rfl: 0;    Physical Exam: Blood pressure 102/58, pulse 78, temperature 98.1 F (36.7 C), temperature source Oral, resp. rate 13, height 6\' 1"  (1.854 m), weight 239 lb (108.41 kg), SpO2 97.00%.    General: Well developed, well nourished, NAD  HEENT: OP clear, mucus membranes moist  SKIN: warm, dry. No rashes.  Neuro: No focal deficits  Musculoskeletal: Muscle strength 5/5 all ext  Psychiatric: Mood and affect normal  Neck: No JVD, no carotid bruits, no thyromegaly, no lymphadenopathy.  Lungs:Clear bilaterally, no wheezes, rhonci, crackles  Cardiovascular: Regular rate and rhythm. No murmurs, gallops or rubs.  Abdomen:Soft. Bowel sounds present. Non-tender.  Extremities: No lower extremity edema. Pulses are 2 + in the bilateral DP/PT.   Labs:   Lab Results  Component Value Date   WBC 6.5 09/05/2014   HGB 12.7* 09/05/2014   HCT 39.1 09/05/2014   MCV 88.3 09/05/2014   PLT 272 09/05/2014     Recent Labs Lab 09/03/14 0958 09/05/14 1141  NA 140 140  K 4.7 4.6  CL 101 102  CO2 26 24  BUN 24* 24*  CREATININE 1.11 1.06  CALCIUM 9.2 9.1  PROT 7.9  --   BILITOT  0.9  --   ALKPHOS 68  --   ALT 14  --   AST 16  --   GLUCOSE 115* 185*    Radiology:  EKG: V-paced  ASSESSMENT AND PLAN:   1. Presyncope/Hypotension: Pt presented to the ED from urgent care with recurrence of dizziness, diaphoresis, weakness, near syncope and chest pain. ICD was interrogated 2 days ago. He was seen in the ED two days ago and felt to be volume depleted. He has resumed his Lasix today and his Losartan last night. No fever, cough or elevation in WBC count to suggest infection. Will admit to telemetry unit. Will hold Lasix and Losartan. Will check orthostatics. Follow BP closely.    2. NICM: He recently developed volume overload with DOE, orthopnea, and chest discomfort while lying down 2 weeks ago. This resolved with diuresis. His wt is down from 260 on 9/28 to 239 today.  He has no evidence of significant volume overload on exam today. He is now hypotensive, likely due to volume depletion from over-diuresis. Will check pBNP and chest Xray. Cont beta blocker. Hold losartan today. Will arrange echo today as planned in the office for this week.    3. HTN: As above.   4. Chest pain: Pt with normal coronaries by cath 2011. Chest pain has resolved now but he describes exertional chest pressure over last 2 weeks. EKG is paced. Will cycle cardiac markers. Given constellation of symptoms including diaphoresis, dizziness, SOB and chest pressure, will arrange cardiac cath in am to exclude CAD. Risks and benefits reviewed with pt. He agrees to proceed.   5. HLD: Cont statin.  6. DM: Hold metformin with cardiac cath in am.   Will add blood cultures with low grade fever, night sweats and indwelling device.    Darlina Guys, MD 09/05/2014, 12:29 PM

## 2014-09-05 NOTE — Progress Notes (Addendum)
Subjective:  This chart was scribed for Mike Agreste, MD by Ladene Artist, ED Scribe. The patient was seen in room 12. Patient's care was started at 9:16 AM.   Patient ID: Mike Jimenez, male    DOB: Oct 13, 1951, 63 y.o.   MRN: [id number] Chief Complaint  Patient presents with  . Hypotension    ckd by PCP Mon - low BP,  seen in ED Tues - sent home from work Mon/Tues, BP low at home today  . Nausea  . Night Sweats    daytime sweats Mon/ Tues   . Weakness  . Weight Loss    pt weighed 260 Mon - 239 today.     HPIJohn Viona Gilmore Jimenez is a 63 y.o. male with h/o of multiple medical problems including nonischemic cardiomyopathy with normal coronaries in 2011. EF 10-15 % at that time by cath, but 20-25% by echo. S/p ICD, h/o CHF, DM and anemia. Is followed by [facility]. Last seen at cardiology 1 week ago complaining of SOB; suspected acute on chronic systolic CHF. Advised fluid restriction and continue Lasix 40 mg daily (weight was 245 at that visit); increased Losartan to 50 mg. At that time pt was taking 20 mg daily of Lasix. He saw his PCP 3 days prior; suspected CHF at that visit with BNP of 2298. Most recent BNP was 3471. He was increased to 40 mg lasix daily. Presented to the ER 2 days ago with presyncope. Had CXR at that time that showed no acute cardio or pulmonary disease. Had been started on antibiotics and Tessalon for cough and congestion prior to ER visit. At work that day BP was 80 systolic, weight 132 in ER, troponin draw obtained which was normal. Cardiology consulted; recommended holding Losartan for 1 dose and resuming 25 mg a day. He was suspected to be volume depleted, so decreased Lasix to 20 mg daily. There is a plan for follow-up echo tomorrow. Continued on beta blocker. Weight today is 239.   Today pt presents with nausea, diaphoresis and generalized weakness over the past few days, worse today. He reports associated L-sided chest soreness onset yesterday. Pt denies exertion  with pain; pain is not reproducible with pressing. Pt was seen by Dr. Ethel Rana PA on 09/02/14 for light-headedness, weakness, nausea and diaphoresis. Blood draw was done and pt was started on Tessalon and antibiotics. Mike Jimenez (dtr) checked pt's BP twice this morning with readings of 100/58 and 88/62. Triage BP 92/58. Pt denies syncope, , leg swelling, new SOB at this time. He takes Losartan at night and restarted Lasix this morning prior to symptoms. Took losartan 25 mg once last night, then 20mg  lasix this morning - none yesterday.   PCP: Mike Jimenez Cardiologist: Lovena Le  Patient Active Problem List   Diagnosis Date Noted  . Acute bronchitis 09/02/2014  . Ventricular tachycardia   . NICM (nonischemic cardiomyopathy)   . LBBB (left bundle branch block)   . Hypertension   . Hyperlipidemia   . Chronic systolic CHF (congestive heart failure) 06/13/2012  . Thyroid cancer, multifocal papillary carcinoma 01/31/2012  . AUTOMATIC IMPLANTABLE CARDIAC DEFIBRILLATOR SITU 07/21/2010  . GERD 07/14/2010  . ANEMIA, IRON DEFICIENCY 05/27/2010  . SHOULDER PAIN 05/26/2010  . ASTHMA 04/23/2010  . HOARSENESS, CHRONIC 02/11/2010  . DIABETES MELLITUS, CONTROLLED 08/06/2009  . HYPOTHYROIDISM 12/21/2007  . CARPAL TUNNEL SYNDROME 05/02/2007   Past Medical History  Diagnosis Date  . Hyperlipidemia   . Hypertension   . Anemia   . NICM (  nonischemic cardiomyopathy)     a. Dx 2011 - presented with sustained VT. Normal coronaries at that time, EF 10-15% by cath and 20-25% by echo. s/p biventricular ICD 2011.  . Gastric ulcer 2011  . Type II diabetes mellitus   . Ventricular tachycardia     a. Sustained VT 2011 s/p MDT [id number] Concert BiV ICD, ser# [id number] H.  . Asthma     a. Childhood asthma.  . Postsurgical hypothyroidism   . Thyroid cancer     a. 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 Dr Harlow Asa. s/p thyroidectomy 2013.  . Obesity   . Torn meniscus   . LBBB (left bundle branch block)    Past Surgical  History  Procedure Laterality Date  . Ptx      traumatic, age 68 months  . Vasectomy    . Cardiac defibrillator placement  03/2010  . Thyroidectomy  12/10/2011    Procedure: TOTAL THYROIDECTOMY WITH CENTRAL COMPARTMENT DISSECTION;  Surgeon: Earnstine Regal, MD;  Location: [facility];  Service: General;  Laterality: N/A;  Total thyroidectomy with limited central compartment lymph node dissection.   Allergies  Allergen Reactions  . Ramipril Cough   Prior to Admission medications   Medication Sig Start Date End Date Taking? Authorizing Provider  aspirin EC 81 MG tablet Take 1 tablet (81 mg total) by mouth daily. 08/29/14  Yes Dayna N Dunn, PA-C  atorvastatin (LIPITOR) 40 MG tablet Take 40 mg by mouth at bedtime.   Yes Historical Provider, MD  carvedilol (COREG) 6.25 MG tablet Take 6.25 mg by mouth 2 (two) times daily with a meal.  01/21/14  Yes Hendricks Limes, MD  cefdinir (OMNICEF) 300 MG capsule Take 1 capsule (300 mg total) by mouth 2 (two) times daily. 09/02/14  Yes Meriam Sprague Saguier, PA-C  furosemide (LASIX) 40 MG tablet Take 20 mg by mouth daily. Changed dosage 09/03/2014 per ED MD 08/29/14  Yes Dayna N Dunn, PA-C  levothyroxine (SYNTHROID, LEVOTHROID) 112 MCG tablet Take 224 mcg by mouth daily. Take 2 tablets daily   Yes Historical Provider, MD  losartan (COZAAR) 25 MG tablet Take 1 tablet (25 mg total) by mouth daily. 09/05/14  Yes Rogelia Mire, NP  metFORMIN (GLUCOPHAGE) 1000 MG tablet Take 1,000 mg by mouth 2 (two) times daily with a meal.   Yes Historical Provider, MD  Multiple Vitamin (MULTIVITAMIN) tablet Take 1 tablet by mouth daily.     Yes Historical Provider, MD  Omega-3 Fatty Acids (FISH OIL) 1000 MG CAPS Take 1 capsule by mouth daily.   Yes Historical Provider, MD  POTASSIUM CHLORIDE PO (OTC) Take 1 capsule once a day   Yes Historical Provider, MD  saw palmetto 80 MG capsule Take 80 mg by mouth daily.    Yes Historical Provider, MD  vitamin E 400 UNIT capsule Take 400 Units by  mouth daily.   Yes Historical Provider, MD  benzonatate (TESSALON) 100 MG capsule Take 1 capsule (100 mg total) by mouth 3 (three) times daily as needed for cough. 09/02/14   Meriam Sprague Saguier, PA-C  tadalafil (CIALIS) 20 MG tablet Take 20 mg by mouth daily as needed for erectile dysfunction.    Historical Provider, MD   History   Social History  . Marital Status: Married    Spouse Name: N/A    Number of Children: 2  . Years of Education: N/A   Occupational History  . Warehouse work    Social History Main Topics  . Smoking  status: Former Smoker -- 4 years    Types: Cigarettes    Quit date: 11/29/1970  . Smokeless tobacco: Never Used     Comment: startes at age 21, 2.5 ppd - quit in 1972  . Alcohol Use: Yes     Comment: rare  . Drug Use: No  . Sexual Activity: Not on file   Other Topics Concern  . Not on file   Social History Narrative   Live w/ wife , daughter lives w/ them - in [place]   6 dogs, fish tank.   Unloads trucks for a living.   Does not routinely exercise.   Review of Systems  Constitutional: Positive for diaphoresis. Negative for fatigue and unexpected weight change.  Eyes: Negative for visual disturbance.  Respiratory: Positive for shortness of breath (unchanged). Negative for cough and chest tightness.   Cardiovascular: Positive for chest pain. Negative for palpitations and leg swelling.  Gastrointestinal: Positive for nausea. Negative for abdominal pain and blood in stool.  Neurological: Positive for weakness and light-headedness (resolved). Negative for dizziness, syncope and headaches.      Objective:   Physical Exam  Vitals reviewed. Constitutional: He is oriented to person, place, and time. He appears well-developed and well-nourished.  HENT:  Head: Normocephalic and atraumatic.  Mouth/Throat: Mucous membranes are normal.  Eyes: EOM are normal. Pupils are equal, round, and reactive to light.  Neck: No JVD present. Carotid bruit is not present.    Cardiovascular: Normal rate, regular rhythm and normal heart sounds.   No murmur heard. Regular rate and rhythm with occasional pause but overall normal rate  Pulmonary/Chest: Effort normal and breath sounds normal. He has no rales.  Musculoskeletal: He exhibits no edema.  Neurological: He is alert and oriented to person, place, and time.  Skin: Skin is warm and dry.  Psychiatric: He has a normal mood and affect.   Filed Vitals:   09/05/14 0849  BP: 92/58  Pulse: 62  Temp: 97.9 F (36.6 C)  TempSrc: Oral  Resp: 18  Height: 6' 1.25" (1.861 m)  Weight: 239 lb (108.41 kg)  SpO2: 97%   EKG: paced rhythm, few PVC's, no apparent change form prior.  Results for orders placed in visit on 09/05/14  GLUCOSE, POCT (MANUAL RESULT ENTRY)      Result Value Ref Range   POC Glucose 105 (*) 70 - 99 mg/dl       Assessment & Plan:   AMARIYON MAYNES is a 63 y.o. male Chest pain, unspecified chest pain type - Plan: EKG 12-Lead  Other fatigue  Generalized weakness - Plan: POCT glucose (manual entry)  Nausea without vomiting  Congestive heart failure, unspecified congestive heart failure chronicity, unspecified congestive heart failure type  Hypotension due to drugs - Plan: POCT glucose (manual entry)  Underlying CHF, nonischemic cardiomyopathy by hx, last cath 2011.  Complicated recent medical history, but suspect he has relative volume depletion as weight 260 to 239 over past week, and down approximately 4 pounds since ER visit 2 days ago. Has had increasing BNP. Hypotensive this am at home, somewhat improved symptomatically in office, but still low BP. Did take lasix this am and 25mg  losartan last night that may be lasting today to contribute to continue hypotension. Also with L sided chest soreness past 2 days, but nonexertional and prior normal coronaries in 2011. No recent coronary eval, but troponin WNL 2 days ago at ER eval.   -discussed with cardiology on call - Dr. Angelena Form. Will have  pt evaluated in the ER this am to decide on further treatment. Advised triage/first nurse at ER as sending by private vehicle.    Patient Instructions  After leaving here - go to [facility] ER for evaluation  - Dr. Angelena Form is aware you are coming there for eval.  I will also advise the emergency room staff.         I personally performed the services described in this documentation, which was scribed in my presence. The recorded information has been reviewed and is accurate.

## 2014-09-05 NOTE — ED Notes (Signed)
Pt comes from [facility], where he went for hypotension. Is CHF pt, reports some CP this morning. Denies pain at current is a x 4.

## 2014-09-05 NOTE — ED Notes (Signed)
Pt has not c/o chest pain ,

## 2014-09-05 NOTE — ED Notes (Signed)
PAGE DR. Foreston UPON ARRIVAL.

## 2014-09-05 NOTE — ED Notes (Addendum)
Pt. Reported to Pharmacy Tech, he is having upper abdominal discomfort.  Will reports to our Ed MD

## 2014-09-05 NOTE — Patient Instructions (Signed)
After leaving here - go to [facility] ER for evaluation  - Dr. Angelena Form is aware you are coming there for eval.  I will also advise the emergency room staff.

## 2014-09-05 NOTE — ED Notes (Signed)
Pt to xray

## 2014-09-05 NOTE — ED Notes (Signed)
Pt. Having chest discomfort, mid-sternal 2/10 Did an ECG and gave to MD.

## 2014-09-05 NOTE — ED Notes (Signed)
Spoke with  Wannetta Sender, NP representing Dr. Julianne Handler. They will be down to see pt.

## 2014-09-05 NOTE — ED Provider Notes (Signed)
CSN: [id number]     Arrival date & time 09/05/14  1109 History   First MD Initiated Contact with Patient 09/05/14 1123     Chief Complaint  Patient presents with  . Hypotension    Patient is a 63 y.o. male presenting with weakness. The history is provided by the patient and medical records.  Weakness This is a recurrent problem. The current episode started today. The problem has been resolved. Associated symptoms include diaphoresis, nausea and weakness (generalized). Pertinent negatives include no abdominal pain, chest pain, chills, coughing, fever, headaches, neck pain, rash, sore throat or vomiting. The symptoms are aggravated by standing. He has tried nothing for the symptoms. The treatment provided no relief.   Patient is a 63 year old Caucasian male with a history of hypertension, HLD, thyroid cancer status post thyroidectomy and pacemaker who presents with hypotension noticed at home. Patient states that while he was standing he felt diaphoretic and generally weak. His daughter checked his blood pressure and it was initially 242 systolic followed by reading of 80 systolic. Patient denies chest pain or lightheadedness. Patient was seen here 2 days ago and had his device interrogated which was unremarkable. And had it an otherwise negative workup. Patient was instructed to have cardiology consult did when he arrived to the ED today.  Past Medical History  Diagnosis Date  . Hyperlipidemia   . Hypertension   . Anemia   . NICM (nonischemic cardiomyopathy)     a. Dx 2011 - presented with sustained VT. Normal coronaries at that time, EF 10-15% by cath and 20-25% by echo. s/p biventricular ICD 2011.  . Gastric ulcer 2011  . Type II diabetes mellitus   . Ventricular tachycardia     a. Sustained VT 2011 s/p MDT [id number] Concert BiV ICD, ser# [id number] H.  . Asthma     a. Childhood asthma.  . Postsurgical hypothyroidism   . Thyroid cancer     a. Papillary thyroid carcinoma (3 foci) w/o  metastases. Dr Harlow Asa. s/p thyroidectomy 2013.  . Obesity   . Torn meniscus   . LBBB (left bundle branch block)    Past Surgical History  Procedure Laterality Date  . Ptx      traumatic, age 7 months  . Vasectomy    . Cardiac defibrillator placement  03/2010  . Thyroidectomy  12/10/2011    Procedure: TOTAL THYROIDECTOMY WITH CENTRAL COMPARTMENT DISSECTION;  Surgeon: Earnstine Regal, MD;  Location: [facility];  Service: General;  Laterality: N/A;  Total thyroidectomy with limited central compartment lymph node dissection.   Family History  Problem Relation Age of Onset  . Diabetes Maternal Grandfather   . Diabetes Maternal Aunt   . Colon cancer Maternal Aunt   . Colon cancer Maternal Aunt   . Lung cancer Paternal Uncle   . Lung cancer Maternal Uncle   . Asthma Sister   . Atrial fibrillation Father   . Prostate cancer Neg Hx   . CAD Neg Hx   . Hypothyroidism Mother   . Hypothyroidism Daughter   . Hypertension Mother     alive @ 4.  Marland Kitchen Hypertension Father     alive @ 23.   History  Substance Use Topics  . Smoking status: Former Smoker -- 4 years    Types: Cigarettes    Quit date: 11/29/1970  . Smokeless tobacco: Never Used     Comment: startes at age 32, 2.5 ppd - quit in 1972  . Alcohol Use: Yes     Comment:  rare    Review of Systems  Constitutional: Positive for diaphoresis. Negative for fever and chills.  HENT: Negative for rhinorrhea and sore throat.   Eyes: Negative for visual disturbance.  Respiratory: Negative for cough and shortness of breath.   Cardiovascular: Negative for chest pain.  Gastrointestinal: Positive for nausea. Negative for vomiting, abdominal pain, diarrhea and constipation.  Genitourinary: Negative for dysuria and hematuria.  Musculoskeletal: Negative for back pain and neck pain.  Skin: Negative for rash.  Neurological: Positive for weakness (generalized). Negative for syncope and headaches.  Psychiatric/Behavioral: Negative for confusion.  All  other systems reviewed and are negative.   Allergies  Ramipril  Home Medications   Prior to Admission medications   Medication Sig Start Date End Date Taking? Authorizing Provider  aspirin EC 81 MG tablet Take 1 tablet (81 mg total) by mouth daily. 08/29/14  Yes Dayna N Dunn, PA-C  atorvastatin (LIPITOR) 40 MG tablet Take 40 mg by mouth at bedtime.   Yes Historical Provider, MD  carvedilol (COREG) 6.25 MG tablet Take 6.25 mg by mouth 2 (two) times daily with a meal.  01/21/14  Yes Hendricks Limes, MD  cefdinir (OMNICEF) 300 MG capsule Take 1 capsule (300 mg total) by mouth 2 (two) times daily. 09/02/14  Yes Meriam Sprague Saguier, PA-C  furosemide (LASIX) 40 MG tablet Take 20 mg by mouth daily. Changed dosage 09/03/2014 per ED MD 08/29/14  Yes Dayna N Dunn, PA-C  levothyroxine (SYNTHROID, LEVOTHROID) 112 MCG tablet Take 224 mcg by mouth daily. Take 2 tablets daily   Yes Historical Provider, MD  losartan (COZAAR) 25 MG tablet Take 1 tablet (25 mg total) by mouth daily. 09/05/14  Yes Rogelia Mire, NP  metFORMIN (GLUCOPHAGE) 1000 MG tablet Take 1,000 mg by mouth 2 (two) times daily with a meal.   Yes Historical Provider, MD  Multiple Vitamin (MULTIVITAMIN) tablet Take 1 tablet by mouth daily.     Yes Historical Provider, MD  Omega-3 Fatty Acids (FISH OIL) 1000 MG CAPS Take 1 capsule by mouth daily.   Yes Historical Provider, MD  POTASSIUM CHLORIDE PO (OTC) Take 1 capsule once a day   Yes Historical Provider, MD  saw palmetto 80 MG capsule Take 80 mg by mouth daily.    Yes Historical Provider, MD  tadalafil (CIALIS) 20 MG tablet Take 20 mg by mouth daily as needed for erectile dysfunction.   Yes Historical Provider, MD  vitamin E 400 UNIT capsule Take 400 Units by mouth daily.   Yes Historical Provider, MD  benzonatate (TESSALON) 100 MG capsule Take 1 capsule (100 mg total) by mouth 3 (three) times daily as needed for cough. 09/02/14   Meriam Sprague Saguier, PA-C   BP 117/83  Pulse 76  Temp(Src) 98.1  F (36.7 C) (Oral)  Resp 16  Ht 6\' 1"  (1.854 m)  Wt 239 lb (108.41 kg)  BMI 31.54 kg/m2  SpO2 98% Physical Exam  Constitutional: He is oriented to person, place, and time. He appears well-developed and well-nourished. No distress.  HENT:  Head: Normocephalic and atraumatic.  Mouth/Throat: Oropharynx is clear and moist.  Eyes: EOM are normal.  Neck: Neck supple. No JVD present.  Cardiovascular: Normal rate, regular rhythm, normal heart sounds and intact distal pulses.   Pulmonary/Chest: Effort normal and breath sounds normal.  Pacemaker palpated in L chest  Abdominal: Soft. He exhibits no distension. There is no tenderness.  Musculoskeletal: Normal range of motion. He exhibits no edema.  Neurological: He is alert  and oriented to person, place, and time. No cranial nerve deficit.  Skin: Skin is warm and dry.  Psychiatric: His behavior is normal.    ED Course  Procedures  None   Labs Review Labs Reviewed  CBC - Abnormal; Notable for the following:    Hemoglobin 12.7 (*)    All other components within normal limits  BASIC METABOLIC PANEL - Abnormal; Notable for the following:    Glucose, Bld 185 (*)    BUN 24 (*)    GFR calc non Af Amer 73 (*)    GFR calc Af Amer 85 (*)    All other components within normal limits  CULTURE, BLOOD (ROUTINE X 2)  I-STAT TROPOININ, ED    Imaging Review Dg Chest 2 View  09/05/2014   CLINICAL DATA:  Hypertension.  Weakness.  EXAM: CHEST  2 VIEW  COMPARISON:  September 03, 2014.  FINDINGS: The heart size and mediastinal contours are within normal limits. Both lungs are clear. Left-sided pacemaker is unchanged in position. No pneumothorax or pleural effusion is noted. The visualized skeletal structures are unremarkable.  IMPRESSION: No acute cardiopulmonary abnormality seen.   Electronically Signed   By: Sabino Dick M.D.   On: 09/05/2014 13:30     EKG Interpretation   Date/Time:  Thursday September 05 2014 11:20:54 EDT Ventricular Rate:  79 PR  Interval:  114 QRS Duration: 172 QT Interval:  468 QTC Calculation: 536 R Axis:   -108 Text Interpretation:  Atrial-sensed ventricular-paced rhythm Biventricular  pacemaker detected Abnormal ECG No significant change was found Confirmed  by Debby Freiberg [phone]) on 09/05/2014 1:02:29 PM      MDM   Final diagnoses:  Ventricular quadrigeminy  General weakness    63 yo M presents with hypotension noted at home. Normotensive here. No abnormal orthostatic changes. Pt appears well, normal cardiac and lung exam. Moist mucous membranes. Cards consulted and will have them evaluate patient. EKG demonstrates paced rhythm, rate 79, no ectopy, QRS 172, QTc 536, no ST elevation or depression. Troponin negative. Remainder of the labs unremarkable. Patient began experiences epigastric discomfort. Repeat EKG demonstrated PVCs in a 3:1 pattern suggestive of quadrigeminy not shown on previous EKG from today. GI cocktail given for pain. Cardiology saw the patient and plans to admit. The plan to perform echo and cardiac cath. Patient asymptomatic at this time. Patient stable upon transfer to the floor.  Case discussed with Dr. Colin Rhein.   Gustavus Bryant, MD 09/05/14 [phone]

## 2014-09-06 ENCOUNTER — Encounter (HOSPITAL_COMMUNITY)
Admission: EM | Disposition: A | Discharge status: Home / Self Care | Payer: Self-pay | Source: Home / Self Care | Attending: Cardiovascular Disease

## 2014-09-06 ENCOUNTER — Other Ambulatory Visit (HOSPITAL_COMMUNITY): Payer: 59

## 2014-09-06 DIAGNOSIS — R509 Fever, unspecified: Secondary | ICD-10-CM

## 2014-09-06 DIAGNOSIS — I379 Nonrheumatic pulmonary valve disorder, unspecified: Secondary | ICD-10-CM

## 2014-09-06 DIAGNOSIS — I2510 Atherosclerotic heart disease of native coronary artery without angina pectoris: Secondary | ICD-10-CM

## 2014-09-06 DIAGNOSIS — E119 Type 2 diabetes mellitus without complications: Secondary | ICD-10-CM

## 2014-09-06 HISTORY — PX: LEFT HEART CATHETERIZATION WITH CORONARY ANGIOGRAM: SHX5451

## 2014-09-06 LAB — BASIC METABOLIC PANEL
ANION GAP: 15 (ref 5–15)
BUN: 20 mg/dL (ref 6–23)
CALCIUM: 8.9 mg/dL (ref 8.4–10.5)
CO2: 25 meq/L (ref 19–32)
CREATININE: 0.88 mg/dL (ref 0.50–1.35)
Chloride: 102 mEq/L (ref 96–112)
GFR calc Af Amer: >90 mL/min (ref >90)
Glucose, Bld: 104 mg/dL — ABNORMAL HIGH (ref 70–99)
Potassium: 4.4 mEq/L (ref 3.7–5.3)
Sodium: 142 mEq/L (ref 137–147)

## 2014-09-06 LAB — CBC
HCT: 37.3 % — ABNORMAL LOW (ref 39.0–52.0)
Hemoglobin: 12.1 g/dL — ABNORMAL LOW (ref 13.0–17.0)
MCH: 28.5 pg (ref 26.0–34.0)
MCHC: 32.4 g/dL (ref 30.0–36.0)
MCV: 88 fL (ref 78.0–100.0)
Platelets: 251 10*3/uL (ref 150–400)
RBC: 4.24 MIL/uL (ref 4.22–5.81)
RDW: 13 % (ref 11.5–15.5)
WBC: 5.5 10*3/uL (ref 4.0–10.5)

## 2014-09-06 LAB — PROTIME-INR
INR: 1.23 (ref 0.00–1.49)
Prothrombin Time: 15.5 seconds — ABNORMAL HIGH (ref 11.6–15.2)

## 2014-09-06 LAB — GLUCOSE, CAPILLARY
GLUCOSE-CAPILLARY: 120 mg/dL — AB (ref 70–99)
Glucose-Capillary: 137 mg/dL — ABNORMAL HIGH (ref 70–99)
Glucose-Capillary: 188 mg/dL — ABNORMAL HIGH (ref 70–99)

## 2014-09-06 LAB — TROPONIN I

## 2014-09-06 SURGERY — LEFT HEART CATHETERIZATION WITH CORONARY ANGIOGRAM
Anesthesia: LOCAL

## 2014-09-06 MED ORDER — FENTANYL CITRATE 0.05 MG/ML IJ SOLN
INTRAMUSCULAR | Status: AC
  Filled 2014-09-06: qty 2

## 2014-09-06 MED ORDER — LEVOTHYROXINE SODIUM 112 MCG PO TABS
224.0000 ug | ORAL_TABLET | Freq: Every day | ORAL | Status: DC
  Administered 2014-09-07: 224 ug via ORAL
  Filled 2014-09-06 (×2): qty 2

## 2014-09-06 MED ORDER — SODIUM CHLORIDE 0.9 % IV SOLN
INTRAVENOUS | Status: AC
  Administered 2014-09-06: 1000 mL via INTRAVENOUS

## 2014-09-06 MED ORDER — ATORVASTATIN CALCIUM 40 MG PO TABS
40.0000 mg | ORAL_TABLET | Freq: Every day | ORAL | Status: DC
Start: 2014-09-06 — End: 2014-09-07
  Administered 2014-09-06: 40 mg via ORAL
  Filled 2014-09-06 (×2): qty 1

## 2014-09-06 MED ORDER — CARVEDILOL 6.25 MG PO TABS
6.2500 mg | ORAL_TABLET | Freq: Two times a day (BID) | ORAL | Status: DC
  Administered 2014-09-06 – 2014-09-07 (×3): 6.25 mg via ORAL
  Filled 2014-09-06 (×5): qty 1

## 2014-09-06 MED ORDER — HEPARIN (PORCINE) IN NACL 2-0.9 UNIT/ML-% IJ SOLN
INTRAMUSCULAR | Status: AC
  Filled 2014-09-06: qty 1000

## 2014-09-06 MED ORDER — LIDOCAINE HCL (PF) 1 % IJ SOLN
INTRAMUSCULAR | Status: AC
  Filled 2014-09-06: qty 30

## 2014-09-06 MED ORDER — MIDAZOLAM HCL 2 MG/2ML IJ SOLN
INTRAMUSCULAR | Status: AC
  Filled 2014-09-06: qty 2

## 2014-09-06 MED ORDER — HEPARIN SODIUM (PORCINE) 1000 UNIT/ML IJ SOLN
INTRAMUSCULAR | Status: AC
  Filled 2014-09-06: qty 1

## 2014-09-06 MED ORDER — ASPIRIN 81 MG PO CHEW
81.0000 mg | CHEWABLE_TABLET | Freq: Every day | ORAL | Status: DC
  Administered 2014-09-06 – 2014-09-07 (×2): 81 mg via ORAL
  Filled 2014-09-06: qty 1

## 2014-09-06 MED ORDER — NITROGLYCERIN 1 MG/10 ML FOR IR/CATH LAB
INTRA_ARTERIAL | Status: AC
  Filled 2014-09-06: qty 10

## 2014-09-06 NOTE — CV Procedure (Addendum)
Cardiac Catheterization Operative Report  Mike Jimenez [id number] 10/9/20158:10 AM Unice Cobble, MD  Procedure Performed:  1. Left Heart Catheterization 2. Selective Coronary Angiography 3. Left ventricular angiogram  Operator: Lauree Chandler, MD  Arterial access site:  Right radial artery.   Indication: 63 y/o male with history of NICM, BiV ICD in place, HTN, HLD, DM II, VT, chronic systolic CHF admitted with weakness, dizziness, chest pain. He has a h/o NICM with nl cors on cath in 2011 and is s/p MDT Bi-V ICD in the setting of sustained VT in 2011. Over the years, he has been reasonably well compensated and maintained on beta blocker, ARB and low dose lasix. He was seen by his PCP on 9/28 and reported a 7-10 day h/o orthopnea, DOE, and chest discomfort upon lying down. His pBNP was > 2000 and CXR showed pulmonary vascular congestion. He was advised to start taking lasix 40mg  daily. He did this and noted good output with drop in wt from 260 to 245. He was seen by Melina Copa, PA in our office on 08/29/14 and was feeling much better. Dyspnea and orthopnea had more or less resolved and he had no further chest discomfort. His SBP was 153mmHg and his losartan was increased from 25->50 mg daily. Ischemic eval was deferred given h/o normal coronaries by cath in 2011. He increased his losartan on 08/30/14 and felt lightheaded so decided to start taking it at night instead. He began to have symptoms of weakness, dizziness and diaphoresis and was seen in primary care. He had a low-grade fever of 99.7 and his BP was 138/87. He refused ECG or CXR and mentioned that he had similar diaphoresis and lightheadedness with prior episode of bronchitis. He was treated with omnicef. CBC and BMET were unremarkable during that visit. He then had recurrence of symptoms and was seen in the ED at [facility] by Dr. Ellyn Hack 09/03/14. SBP in the 80s. His symptoms quickly resolved. Blood pressure was stable in the ED.  His ICD was interrogated and did not show any arrhythmia. pBNP of 3471 on 09/03/14 (was 2298 on 9/28), however CXR shows no active cardiopulmonary disease. He was felt to be volume depleted and his Losartan was reduced to 25 mg daily, Lasix was reduced to 20 mg daily. Admitted 09/05/14 with chest pain, weakness, dizziness, diaphoresis.                                      Procedure Details: The risks, benefits, complications, treatment options, and expected outcomes were discussed with the patient. The patient and/or family concurred with the proposed plan, giving informed consent. The patient was brought to the cath lab after IV hydration was begun and oral premedication was given. The patient was further sedated with Versed and Fentanyl. The right wrist was assessed with a reverse Allens test which was positive. The right wrist was prepped and draped in a sterile fashion. 1% lidocaine was used for local anesthesia. Using the modified Seldinger access technique, a 5 French sheath was placed in the right radial artery. 3 mg Verapamil was given through the sheath. 5000 units IV heparin was given. Standard diagnostic catheters were used to perform selective coronary angiography. A pigtail catheter was used to perform a left ventricular angiogram. The sheath was removed from the right radial artery and a Terumo hemostasis band was applied at the arteriotomy site on the  right wrist.    There were no immediate complications. The patient was taken to the recovery area in stable condition.   Hemodynamic Findings: Central aortic pressure: 134/75 Left ventricular pressure: 125/12/15  Angiographic Findings:  Left main: No obstructive disease.   Left Anterior Descending Artery: Large caliber vessel that courses to the apex. There are mild luminal irregularities in the mid vessel. There are several small caliber diagonal branches with no obstructive disease.   Circumflex Artery: Large caliber vessel with  termination into a large caliber bifurcating obtuse marginal branch. There is a very small caliber AV groove branch that has an ostial 99% stenosis. This vessel is unchanged from cath in 2011 and too small for PCI.   Right Coronary Artery: Large dominant vessel with mild luminal irregularities mid vessel.   Left Ventricular Angiogram: LVEF=20-25%, global hypokinesis.   Impression: 1. Mild non-obstructive CAD 2. Severe LV systolic dysfunction  Recommendations: His chest pain is not cardiac. He has been known to have severe LV dysfunction due to a non-ischemic cardiomyopathy for years. LV filling pressures are ok. Would continue to hold his Lasix and Losartan. He may need a formal CHF team consult to assist in management of his chronic CHF. With indwelling device (ICD) and recent episodes of night sweats, low grade fever we also have blood cultures pending. Will continue to monitor today.        Complications:  None. The patient tolerated the procedure well.

## 2014-09-06 NOTE — Progress Notes (Signed)
UR Completed Ramsey Midgett Graves-Bigelow, RN,BSN [phone]  

## 2014-09-06 NOTE — ED Provider Notes (Signed)
I saw and evaluated the patient, reviewed the resident's note and I agree with the findings and plan.   EKG Interpretation   Date/Time:  Thursday September 05 2014 11:20:54 EDT Ventricular Rate:  79 PR Interval:  114 QRS Duration: 172 QT Interval:  468 QTC Calculation: 536 R Axis:   -108 Text Interpretation:  Atrial-sensed ventricular-paced rhythm Biventricular  pacemaker detected Abnormal ECG No significant change was found Confirmed  by Debby Freiberg [phone]) on 09/05/2014 1:02:29 PM       Briefly, pt is a 63 y.o. male presenting with hypotension and near syncope.  I performed an examination on the patient including cardiac, pulmonary, and gi systems which were unremarkable.  Pt with improved symptoms on my exam.  Admitted to cardiology.     Debby Freiberg, MD 09/06/14 [phone]

## 2014-09-06 NOTE — Interval H&P Note (Signed)
History and Physical Interval Note:  09/06/2014 7:34 AM  Mike Jimenez  has presented today for cardiac cath with the diagnosis of chest pain.  The various methods of treatment have been discussed with the patient and family. After consideration of risks, benefits and other options for treatment, the patient has consented to  Procedure(s): LEFT HEART CATHETERIZATION WITH CORONARY ANGIOGRAM (N/A) as a surgical intervention .  The patient's history has been reviewed, patient examined, no change in status, stable for surgery.  I have reviewed the patient's chart and labs.  Questions were answered to the patient's satisfaction.    Cath Lab Visit (complete for each Cath Lab visit)  Clinical Evaluation Leading to the Procedure:   ACS: No.  Non-ACS:    Anginal Classification: CCS II  Anti-ischemic medical therapy: Minimal Therapy (1 class of medications)  Non-Invasive Test Results: No non-invasive testing performed  Prior CABG: No previous CABG        Tobechukwu Emmick

## 2014-09-06 NOTE — Progress Notes (Signed)
    Subjective:  Patient just back from the cardiac catheterization lab. He feels well. He denies chest pain or shortness of breath.  Objective:  Vital Signs in the last 24 hours: Temp:  [97.7 F (36.5 C)-98.7 F (37.1 C)] 97.8 F (36.6 C) (10/09 0506) Pulse Rate:  [71-84] 76 (10/09 0836) Resp:  [12-21] 18 (10/09 0836) BP: (92-148)/(57-85) 136/85 mmHg (10/09 0836) SpO2:  [95 %-99 %] 96 % (10/09 0836) Weight:  [236 lb 3.2 oz (107.14 kg)-239 lb 1.6 oz (108.455 kg)] 236 lb 3.2 oz (107.14 kg) (10/09 0506)  Intake/Output from previous day: 10/08 0701 - 10/09 0700 In: 391.7 [P.O.:240; I.V.:151.7] Out: 750 [Urine:750]  Physical Exam: Pt is alert and oriented, NAD HEENT: normal Neck: JVP - normal Lungs: CTA bilaterally CV: RRR without murmur or gallop Abd: soft, NT, Positive BS, no hepatomegaly Ext: no C/C/E, distal pulses intact and equal, TR band in place over the right wrist Skin: warm/dry no rash   Lab Results:  Recent Labs  09/05/14 1141 09/06/14 0343  WBC 6.5 5.5  HGB 12.7* 12.1*  PLT 272 251    Recent Labs  09/05/14 1141 09/06/14 0343  NA 140 142  K 4.6 4.4  CL 102 102  CO2 24 25  GLUCOSE 185* 104*  BUN 24* 20  CREATININE 1.06 0.88    Recent Labs  09/05/14 2229 09/06/14 0343  TROPONINI <0.30 <0.30    Cardiac Studies: Cardiac catheterization study reviewed  Assessment/Plan:  1. Chronic systolic heart failure related to severe nonischemic cardiomyopathy. LVEDP normal at cath. 2. Pre-syncope/hypotension. Symptoms started when the patient was placed back on furosemide. Will hold for now and he may do better with as needed use of diuretics. He also was more lightheaded and weak with the 50 mg dose of losartan. Anticipate holding losartan today and consider starting back on 25 mg tomorrow 3. Chest pain. Appears to be noncardiac. Catheterization showed minimal CAD. 4. Type 2 diabetes. Metformin on hold. CBG range 96-136. Continue sliding scale  insulin. 5. Low-grade fever. Blood cultures pending. Check 2-D echocardiogram to make sure there is no obvious valvular abnormality or lead associated vegetation. I do not have a high suspicion for this.   Dispo: possible discharge tomorrow.   Sherren Mocha, M.D. 09/06/2014, 10:42 AM

## 2014-09-06 NOTE — Progress Notes (Signed)
Nutrition Brief Note  Patient identified on the Malnutrition Screening Tool (MST) Report for weight loss. Weight fluctuates with fluid status.  Wt Readings from Last 15 Encounters:  09/06/14 236 lb 3.2 oz (107.14 kg)  09/06/14 236 lb 3.2 oz (107.14 kg)  09/05/14 239 lb (108.41 kg)  09/03/14 242 lb (109.77 kg)  09/02/14 244 lb 6.4 oz (110.859 kg)  08/29/14 245 lb (111.131 kg)  08/26/14 260 lb 8 oz (118.162 kg)  03/06/14 266 lb (120.657 kg)  01/23/14 274 lb (124.286 kg)  08/02/13 261 lb (118.389 kg)  05/15/13 272 lb 6.4 oz (123.56 kg)  04/30/13 266 lb (120.657 kg)  06/13/12 273 lb (123.832 kg)  03/20/12 278 lb (126.1 kg)  02/10/12 291 lb 12.8 oz (132.36 kg)    Body mass index is 31.17 kg/(m^2). Patient meets criteria for obesity, class 1 based on current BMI.   Current diet order is heart healthy, CHO modified, patient is consuming approximately 100% of meals at this time. Labs and medications reviewed.   No nutrition interventions warranted at this time. If nutrition issues arise, please consult RD.   Molli Barrows, RD, LDN, [place] Pager [phone] After Hours Pager [phone]

## 2014-09-06 NOTE — Progress Notes (Signed)
  Echocardiogram 2D Echocardiogram has been performed.  Mike Jimenez 09/06/2014, 2:15 PM

## 2014-09-07 ENCOUNTER — Encounter: Payer: Self-pay | Admitting: Adult Health

## 2014-09-07 DIAGNOSIS — I493 Ventricular premature depolarization: Secondary | ICD-10-CM

## 2014-09-07 DIAGNOSIS — I5022 Chronic systolic (congestive) heart failure: Secondary | ICD-10-CM

## 2014-09-07 DIAGNOSIS — E785 Hyperlipidemia, unspecified: Secondary | ICD-10-CM

## 2014-09-07 LAB — GLUCOSE, CAPILLARY
GLUCOSE-CAPILLARY: 127 mg/dL — AB (ref 70–99)
Glucose-Capillary: 135 mg/dL — ABNORMAL HIGH (ref 70–99)

## 2014-09-07 MED ORDER — ATORVASTATIN CALCIUM 20 MG PO TABS: 20.0000 mg | ORAL_TABLET | Freq: Every day | ORAL | Status: DC

## 2014-09-07 MED ORDER — ATORVASTATIN CALCIUM 20 MG PO TABS
20.0000 mg | ORAL_TABLET | Freq: Every day | ORAL | Status: DC
  Filled 2014-09-07: qty 1

## 2014-09-07 MED ORDER — LOSARTAN POTASSIUM 25 MG PO TABS
25.0000 mg | ORAL_TABLET | Freq: Every day | ORAL | Status: DC
  Filled 2014-09-07: qty 1

## 2014-09-07 MED ORDER — FUROSEMIDE 40 MG PO TABS: 20.0000 mg | ORAL_TABLET | ORAL | Status: DC | PRN

## 2014-09-07 NOTE — Progress Notes (Signed)
   Subjective: No CP or dizziness or SOB  Throat hoarse but not sore.   Objective: Filed Vitals:   09/06/14 1230 09/06/14 1501 09/06/14 2029 09/07/14 0546  BP: 124/72 125/73 142/86 140/89  Pulse: 79 79 83 86  Temp:  98 F (36.7 C) 97.6 F (36.4 C) 98.4 F (36.9 C)  TempSrc:  Oral Oral Oral  Resp:  18 25   Height:      Weight:    237 lb 3.2 oz (107.593 kg)  SpO2: 96% 97% 97% 96%   Weight change: -1 lb 12.8 oz (-0.816 kg)  Intake/Output Summary (Last 24 hours) at 09/07/14 0803 Last data filed at 09/07/14 0515  Gross per 24 hour  Intake    960 ml  Output   1475 ml  Net   -515 ml    General: Alert, awake, oriented x3, in no acute distress Neck:  JVP is normal Heart: Regular rate and rhythm, without murmurs, rubs, gallops.  Lungs: Clear to auscultation.  No rales or wheezes. Exemities:  No edema.   Neuro: Grossly intact, nonfocal.   Lab Results: Results for orders placed during the hospital encounter of 09/05/14 (from the past 24 hour(s))  GLUCOSE, CAPILLARY     Status: Abnormal   Collection Time    09/06/14 11:36 AM      Result Value Ref Range   Glucose-Capillary 137 (*) 70 - 99 mg/dL  GLUCOSE, CAPILLARY     Status: Abnormal   Collection Time    09/06/14  4:52 PM      Result Value Ref Range   Glucose-Capillary 188 (*) 70 - 99 mg/dL  GLUCOSE, CAPILLARY     Status: Abnormal   Collection Time    09/06/14  8:26 PM      Result Value Ref Range   Glucose-Capillary 120 (*) 70 - 99 mg/dL    Studies/Results: No results found.  Medications: REviewed   @PROBHOSP @  1.  Hx CP  Minimal CAD by cath  Not a cause for CP   2.  Chronic systolic CHF  LVEDP normal at cath  Volume looks good today.  Would resume Cozaar 25  To take lasix prn at home   3.  Hypotension  BP improved.  Follow    4.DM Typed II,  Metformin on hold for another day. 5.  Fever  Currently afebrile  Bl Cultures negative.    6.  HL  Was on 20 lipitor at home  LDL was 71 in July  (increased dose on admit)   Will resume 20 Lipitor  Patient has f/u appt on Fri with Dayna Dunn  Keep.     LOS: 2 days   Dorris Carnes 09/07/2014, 8:03 AM

## 2014-09-07 NOTE — Discharge Summary (Signed)
Physician Discharge Summary  Patient ID: Mike Jimenez MRN: [id number] DOB/AGE: 06/20/1951 63 y.o.  Admit date: 09/05/2014 Discharge date: 09/07/2014  Primary Discharge Diagnosis: 1.Chest Pain: Non-cardiac 2. Chronic Systolic CHF 3. Hypotension  Secondary Discharge Diagnosis 1.CAD 2. NICM 3. Diabetes 4. Hx of MDT Bi-V ICD in situ  Primary Cardiologist:  1.GreggTaylor  Significant Diagnostic Studies:  1.Cardiac Cath 09/06/2014 Left main: No obstructive disease.  Left Anterior Descending Artery: Large caliber vessel that courses to the apex. There are mild luminal irregularities in the mid vessel. There are several small caliber diagonal branches with no obstructive disease.  Circumflex Artery: Large caliber vessel with termination into a large caliber bifurcating obtuse marginal branch. There is a very small caliber AV groove branch that has an ostial 99% stenosis. This vessel is unchanged from cath in 2011 and too small for PCI.  Right Coronary Artery: Large dominant vessel with mild luminal irregularities mid vessel.  Left Ventricular Angiogram: LVEF=20-25%, global hypokinesis.  Impression:  1. Mild non-obstructive CAD  2. Severe LV systolic dysfunction Left main: No obstructive disease.  Left Anterior Descending Artery: Large caliber vessel that courses to the apex. There are mild luminal irregularities in the mid vessel. There are several small caliber diagonal branches with no obstructive disease.  Circumflex Artery: Large caliber vessel with termination into a large caliber bifurcating obtuse marginal branch. There is a very small caliber AV groove branch that has an ostial 99% stenosis. This vessel is unchanged from cath in 2011 and too small for PCI.  Right Coronary Artery: Large dominant vessel with mild luminal irregularities mid vessel.  Left Ventricular Angiogram: LVEF=20-25%, global hypokinesis.  Impression:  1. Mild non-obstructive CAD  2. Severe LV systolic  dysfunction  Recommendations: His chest pain is not cardiac. He has been known to have severe LV dysfunction due to a non-ischemic cardiomyopathy for years. LV filling pressures are ok. Would continue to hold his Lasix and Losartan. He may need a formal CHF team consult to assist in management of his chronic CHF. With indwelling device (ICD) and recent episodes of night sweats, low grade fever we also have blood cultures pending. Will continue to monitor today.   Echocardiogram: 09/06/2014 Left ventricle: Diffuse hypokinesis worse in the inferior wall LVOT signal suggests low output. The cavity size was severely dilated. Wall thickness was normal. Systolic function was severely reduced. The estimated ejection fraction was in the range of 20% to 25%. - Left atrium: The atrium was moderately dilated. - Right ventricle: The cavity size was mildly dilated. - Atrial septum: No defect or patent foramen ovale was identified.  Consults: None  Hospital Course:     Mike Jimenez is a 63 year old patient of Dr. Lovena Le was complicated cardiac history to include nonischemic cardio myopathy, by the ICD in situ, hypertension, chronic systolic CHF, with other history can include hyperlipidemia, diabetes, type II. The patient was admitted from primary care physician's office after he was seen there for complaints of nausea, diaphoresis, weakness, and chest discomfort. For this patient had a 7-10 day history of orthopnea, dyspnea on exertion, and chest discomfort upon lying down.   He was seen by PCP on 08/26/2014 he had elevated proBNP graded 2000 with a chest x-ray showed pulmonary vascular congestion. And was advised to take Lasix 40 mg daily. The day of admission the patient suddenly hypotensive and was seen by urgent medical, family care, and was advised to go to the ER. Due to symptoms as described, hypotension, and history  of CAD.     On admission, he was provided with IV hydration as he was felt to be volume  depleted. Cardiac catheterization was completed with known history and symptoms. Metformin was held. Her calf is dated 09/06/2014. The patient had mild nonobstructive CAD with severe LV systolic dysfunction, LVEF of 20-25% with global hypokinesis. His Lasix and losartan were held.      He was having episodes of night sweats and a low-grade fever and blood cultures were drawn. No growth with seen on culture.    The patient normalized with no further complaints of dizziness or diaphoresis or chest pain. Echocardiogram was performed to confirm LV function. There is diffuse hypokinesis of the left ventricle, worse in the inferior wall . LVOT signal suggests low output. The cavity size with severely dilated. Wall thickness was normal. Systolic function was severely reduced.  with an EF of 20-25%, the left atrium was moderately dilated, right ventricle cavity size is mildly dilated.     Date discharged the patient was seen and examined by Dr. Dorris Carnes . Cozaar was resumed at 25 mg daily. He was advised to take the Lasix as needed, but not on a daily basis. Lipitor was decreased from 40 mg daily to 20 mg daily. The patient was to keep followup appointment previously scheduled with Melina Copa, PA, on 09/13/2014.         Discharge Exam: Blood pressure 140/89, pulse 86, temperature 98.4 F (36.9 C), temperature source Oral, resp. rate 25, height 6\' 1"  (1.854 m), weight 237 lb 3.2 oz (107.593 kg), SpO2 96.00%.   Labs:   Lab Results  Component Value Date   WBC 5.5 09/06/2014   HGB 12.1* 09/06/2014   HCT 37.3* 09/06/2014   MCV 88.0 09/06/2014   PLT 251 09/06/2014    Recent Labs Lab 09/03/14 0958  09/06/14 0343  NA 140  < > 142  K 4.7  < > 4.4  CL 101  < > 102  CO2 26  < > 25  BUN 24*  < > 20  CREATININE 1.11  < > 0.88  CALCIUM 9.2  < > 8.9  PROT 7.9  --   --   BILITOT 0.9  --   --   ALKPHOS 68  --   --   ALT 14  --   --   AST 16  --   --   GLUCOSE 115*  < > 104*  < > = values in this interval  not displayed. Lab Results  Component Value Date   TROPONINI <0.30 09/06/2014    Lab Results  Component Value Date   CHOL 121 06/20/2014   CHOL 186 01/29/2014   CHOL 180 03/09/2011   Lab Results  Component Value Date   HDL 34.70* 06/20/2014   HDL 38.50* 01/29/2014   HDL 37.50* 03/09/2011   Lab Results  Component Value Date   LDLCALC 71 06/20/2014   LDLCALC 108* 01/29/2014   LDLCALC 104* 03/09/2011   Lab Results  Component Value Date   TRIG 77.0 06/20/2014   TRIG 196.0* 01/29/2014   TRIG 192.0* 03/09/2011   Lab Results  Component Value Date   CHOLHDL 3 06/20/2014   CHOLHDL 5 01/29/2014   CHOLHDL 5 03/09/2011   Lab Results  Component Value Date   LDLDIRECT 144.9 11/01/2008   LDLDIRECT 170.2 01/25/2007      Radiology: Dg Chest 2 View  09/05/2014   CLINICAL DATA:  Hypertension.  Weakness.  EXAM: CHEST  2 VIEW  COMPARISON:  September 03, 2014.  FINDINGS: The heart size and mediastinal contours are within normal limits. Both lungs are clear. Left-sided pacemaker is unchanged in position. No pneumothorax or pleural effusion is noted. The visualized skeletal structures are unremarkable.  IMPRESSION: No acute cardiopulmonary abnormality seen.   Electronically Signed   By: Sabino Dick M.D.   On: 09/05/2014 13:30   Dg Chest 2 View  09/03/2014   CLINICAL DATA:  Chest pain, dizziness, shortness of Breath  EXAM: CHEST  2 VIEW  COMPARISON:  08/26/2014  FINDINGS: Borderline cardiomegaly. Three leads cardiac pacemaker is unchanged in position. No acute infiltrate or pleural effusion. No pulmonary edema. Stable mild degenerative changes thoracic spine.  IMPRESSION: No active cardiopulmonary disease.   Electronically Signed   By: Lahoma Crocker M.D.   On: 09/03/2014 10:33   Dg Chest 2 View  08/26/2014   CLINICAL DATA:  Mid chest pain, congestion, and night sweats for 2-3 weeks, recently worked in a mold infested environment, history pacemaker, cardiomyopathy, thyroid cancer  EXAM: CHEST  2 VIEW  COMPARISON:   12/06/2011  FINDINGS: LEFT subclavian AICD leads project at RIGHT atrium, RIGHT ventricle and coronary sinus.  Enlargement of cardiac silhouette with pulmonary vascular congestion.  Mediastinal contours normal.  Lungs clear.  No pleural effusion or pneumothorax.  Bones unremarkable.  IMPRESSION: Enlargement of cardiac silhouette with pulmonary vascular congestion post AICD.  No acute abnormalities.   Electronically Signed   By: Lavonia Dana M.D.   On: 08/26/2014 15:28    EKG:BiV pacemaker.Rate of 81 bpm.  FOLLOW UP PLANS AND APPOINTMENTS     Discharge Instructions   Diet - low sodium heart healthy    Complete by:  As directed      Diet - low sodium heart healthy    Complete by:  As directed      Increase activity slowly    Complete by:  As directed      Increase activity slowly    Complete by:  As directed             Medication List         aspirin EC 81 MG tablet  Take 1 tablet (81 mg total) by mouth daily.     atorvastatin 20 MG tablet  Commonly known as:  LIPITOR  Take 1 tablet (20 mg total) by mouth daily at 6 PM.     benzonatate 100 MG capsule  Commonly known as:  TESSALON  Take 1 capsule (100 mg total) by mouth 3 (three) times daily as needed for cough.     carvedilol 6.25 MG tablet  Commonly known as:  COREG  Take 6.25 mg by mouth 2 (two) times daily with a meal.     cefdinir 300 MG capsule  Commonly known as:  OMNICEF  Take 1 capsule (300 mg total) by mouth 2 (two) times daily.     Fish Oil 1000 MG Caps  Take 1 capsule by mouth daily.     furosemide 40 MG tablet  Commonly known as:  LASIX  Take 0.5 tablets (20 mg total) by mouth as needed. Changed dosage 09/03/2014 per ED MD     levothyroxine 112 MCG tablet  Commonly known as:  SYNTHROID, LEVOTHROID  Take 224 mcg by mouth daily. Take 2 tablets daily     losartan 25 MG tablet  Commonly known as:  COZAAR  Take 1 tablet (25 mg total) by mouth daily.     metFORMIN  1000 MG tablet  Commonly known as:   GLUCOPHAGE  Take 1,000 mg by mouth 2 (two) times daily with a meal.     multivitamin tablet  Take 1 tablet by mouth daily.     POTASSIUM CHLORIDE PO  (OTC) Take 1 capsule once a day     saw palmetto 80 MG capsule  Take 80 mg by mouth daily.     tadalafil 20 MG tablet  Commonly known as:  CIALIS  Take 20 mg by mouth daily as needed for erectile dysfunction.     vitamin E 400 UNIT capsule  Take 400 Units by mouth daily.       Follow-up Information   Follow up with Melina Copa, PA-C On 09/13/2014. (Keep previously scheduled appt.)    Specialty:  Cardiology   Contact information:   [address] [place] [address] [phone]         Time spent with patient to include physician time:35 minutes Signed: Phill Myron. Nichael Ehly NP  09/07/2014, 10:37 AM Co-Sign MD

## 2014-09-11 LAB — CULTURE, BLOOD (ROUTINE X 2): CULTURE: NO GROWTH

## 2014-09-13 ENCOUNTER — Ambulatory Visit (INDEPENDENT_AMBULATORY_CARE_PROVIDER_SITE_OTHER): Payer: 59 | Admitting: Physician Assistant

## 2014-09-13 ENCOUNTER — Encounter: Payer: Self-pay | Admitting: Physician Assistant

## 2014-09-13 ENCOUNTER — Other Ambulatory Visit: Payer: 59 | Admitting: *Deleted

## 2014-09-13 VITALS — BP 122/68 | HR 78 | Ht 73.0 in | Wt 244.8 lb

## 2014-09-13 DIAGNOSIS — I428 Other cardiomyopathies: Secondary | ICD-10-CM

## 2014-09-13 DIAGNOSIS — I5023 Acute on chronic systolic (congestive) heart failure: Secondary | ICD-10-CM

## 2014-09-13 DIAGNOSIS — I5022 Chronic systolic (congestive) heart failure: Secondary | ICD-10-CM

## 2014-09-13 DIAGNOSIS — I2510 Atherosclerotic heart disease of native coronary artery without angina pectoris: Secondary | ICD-10-CM | POA: Insufficient documentation

## 2014-09-13 DIAGNOSIS — R55 Syncope and collapse: Secondary | ICD-10-CM

## 2014-09-13 DIAGNOSIS — I429 Cardiomyopathy, unspecified: Secondary | ICD-10-CM

## 2014-09-13 DIAGNOSIS — I10 Essential (primary) hypertension: Secondary | ICD-10-CM

## 2014-09-13 NOTE — Patient Instructions (Addendum)
Your physician recommends that you continue on your current medications as directed. Please refer to the Current Medication list given to you today.    Your physician recommends that you schedule appointment  WITH DR Alvia Grove  IN THE [facility] IN ONE MONTH

## 2014-09-13 NOTE — Progress Notes (Signed)
[place], [address] [place], [place]  [address] Phone: [phone] Fax:  [phone]  Date:  09/13/2014   Patient ID:  Mike Jimenez 06-Sep-1951, MRN [id number]   PCP:  Unice Cobble, MD  Cardiologist: Dr. Lovena Le    History of Present Illness: Mike Jimenez is a 63 y.o. male with history of chronic systolic CHF/NICM (dx 7035 in setting of sustained VT s/p BiV-ICD - EF 10-15% then and 20-25% recently), HTN, HLD, thyroid cancer s/p surgery who presents for follow-up. His recent history is somewhat complex.  He had seen his PCP for DOE and orthopnea on 08/26/14. Prior to this, he had only been taking 1/2 tablet of Lasix (20mg  total) daily due to the hot weather and trying to prevent dehydration. (He traditionally drinks a lot of water.) pBNP was 2298. CXR 08/26/14: Enlargement of cardiac silhouette with pulmonary vascular congestion, no acute abnormalities. PCP increased Lasix to 40mg  daily and asked him to f/u here. I saw him on 08/29/14 at which time he was feeling significantly better, but still with some DOE and not yet back to baseline. BP at that visit was 130/80. He had denied syncope, LEE, ICD discharge or palpitations. He weighed 245 in the office that day, confirmed on 2 scales, which he had said was more in line with the 241 he had been getting at home than the 260 he had weighed recently at his primary's office on 08/26/14. He has also been recently actively trying to lose weight. At that visit, his CHF regimen consisted of losartan 25mg  daily and carvedilol 6.25mg  BID. I increased his losartan to 50mg  daily and advised f/u BMET, echo, and appointment. He had also mentioned some night sweats recently and I had advised him to discuss with PCP as these had preceded his sx of volume overload.   On 09/02/14, he saw family medicine for nausea, sweating, nasal congestion, and 15 minutes of lightheadedness. He declined CXR and EKG at that visit and BP was 138/87. Temp was 99.7. WBC was  normal, CMET was stable, and he was prescribed an antibiotic.   On 09/03/14 he presented to the ED feeling poorly. While at work he became very lightheaded and diaphoretic. He had no chest pain or dyspnea. EMS was called and BP was recorded in the 80s. ICD was interrogated and did not show any arrhythmia. pBNP was 3471, however CXR showed no active cardiopulm disease. He was not volume overloaded on exam. Losartan was decreased back to 25mg  daily and Lasix was cut down to 20mg  daily.   He went to [facility] on 09/05/14 with nausea, diaphoresis, and generalized weakness as well as chest pain. BP was 92/58. He was sent to the ED for admission and BP was 102/58. BUN was 24, Cr stable at 1.1. Lasix and Losartan were held. Given recent diaphoresis, low grade temp on 09/02/14, night sweats, and ICD, blood cultures were obtained to exclude occult infection which were negative. pBNP was 2587. He underwent LHC on 09/06/14 showing mild nonobstructive CAD (there was a very small caliber AV groove branch that has an ostial 99% stenosis, unchanged from cath in 2011 and too small for PCI); EF 20-25% with global HK. 2D echo was obtained 09/06/14 with EF 20-25%, severely dilated LV, diffuse hypokinesis worse in the inferior wall, LVOT signal suggests low output, mod dilated LA, mildly dilated RV. Discharge blood pressure was 140/89 and discharge weight 237lb. He was discharged with plan for PRN  Lasix only for weight >245lb and losartan 25mg  daily.  He returns back to clinic today feeling better. He has no acute complaints. No CP or SOB. Cath site is doing OK. Weight 244lbs. He got the unfortunate news that he has been laid off but he thinks this might not be a bad thing in the long run. 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  Recent Labs: 06/20/2014: HDL Cholesterol by NMR 34.70*; LDL (calc) 71  09/03/2014: ALT 14  09/05/2014: Pro B Natriuretic peptide (BNP) 2587.0*  09/06/2014:  Creatinine 0.88; Hemoglobin 12.1*; Potassium 4.4   Wt Readings from Last 3 Encounters:  09/13/14 244 lb 12.8 oz (111.041 kg)  09/07/14 237 lb 3.2 oz (107.593 kg)  09/07/14 237 lb 3.2 oz (107.593 kg)     Past Medical History  Diagnosis Date  . Hyperlipidemia   . Hypertension   . Anemia   . NICM (nonischemic cardiomyopathy)     a. Dx 2011 - presented with sustained VT. Normal coronaries at that time, EF 10-15% by cath and 20-25% by echo. b. s/p biventricular ICD 2011. c. CP 08/2014: mild nonobstructive CAD.  Marland Kitchen Gastric ulcer 2011  . Ventricular tachycardia     a. Sustained VT 2011 s/p MDT [id number] Concert BiV ICD, ser# [id number] H.  . Postsurgical hypothyroidism   . Obesity   . Torn meniscus   . LBBB (left bundle branch block)   . Automatic implantable cardioverter-defibrillator in situ   . CHF (congestive heart failure)   . Type II diabetes mellitus   . Childhood asthma   . GERD (gastroesophageal reflux disease)   . Arthritis     "knees" (09/05/2014)  . History of gout   . Thyroid cancer     a. 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 Dr Harlow Asa. s/p thyroidectomy 2013.  Marland Kitchen CAD (coronary artery disease)     a. Mild nonobstructive CAD by cath 08/2014.    Current Outpatient Prescriptions  Medication Sig Dispense Refill  . aspirin EC 81 MG tablet Take 1 tablet (81 mg total) by mouth daily.      Marland Kitchen atorvastatin (LIPITOR) 20 MG tablet Take 1 tablet (20 mg total) by mouth daily at 6 PM.  30 tablet  10  . carvedilol (COREG) 6.25 MG tablet Take 6.25 mg by mouth 2 (two) times daily with a meal.       . furosemide (LASIX) 40 MG tablet Take 0.5 tablets (20 mg total) by mouth as needed. Changed dosage 09/03/2014 per ED MD  30 tablet  6  . levothyroxine (SYNTHROID, LEVOTHROID) 112 MCG tablet Take 224 mcg by mouth daily. Take 2 tablets daily      . losartan (COZAAR) 25 MG tablet Take 1 tablet (25 mg total) by mouth daily.  30 tablet  6  . metFORMIN (GLUCOPHAGE) 1000 MG tablet Take  1,000 mg by mouth 2 (two) times daily with a meal.      . Multiple Vitamin (MULTIVITAMIN) tablet Take 1 tablet by mouth daily.        . Omega-3 Fatty Acids (FISH OIL) 1000 MG CAPS Take 1 capsule by mouth daily.      Marland Kitchen POTASSIUM CHLORIDE PO (OTC) Take 1 capsule once a day      . saw palmetto 80 MG capsule Take 80 mg by mouth daily.       . tadalafil (CIALIS) 20 MG tablet Take 20 mg by mouth daily as needed for erectile dysfunction.      Marland Kitchen  vitamin E 400 UNIT capsule Take 400 Units by mouth daily.      . [DISCONTINUED] calcium carbonate (OS-CAL - DOSED IN MG OF ELEMENTAL CALCIUM) 1250 MG tablet Take 2 tablets (1,000 mg of elemental calcium total) by mouth 3 (three) times daily.  50 tablet  0   No current facility-administered medications for this visit.    Allergies:   Ramipril   Social History:  The patient  reports that he quit smoking about 43 years ago. His smoking use included Cigarettes. He has a 5 pack-year smoking history. He has quit using smokeless tobacco. His smokeless tobacco use included Chew. He reports that he drinks alcohol. He reports that he does not use illicit drugs.   Family History:  The patient's family history includes Asthma in his sister; Atrial fibrillation in his father; Colon cancer in his maternal aunt and maternal aunt; Diabetes in his maternal aunt and maternal grandfather; Hypertension in his father and mother; Hypothyroidism in his daughter and mother; Lung cancer in his maternal uncle and paternal uncle. There is no history of Prostate cancer or CAD.  ROS:  Please see the history of present illness.     All other systems reviewed and negative.   PHYSICAL EXAM:  VS:  BP 122/68  Pulse 78  Ht 6\' 1"  (1.854 m)  Wt 244 lb 12.8 oz (111.041 kg)  BMI 32.30 kg/m2 Well nourished, well developed WM, in no acute distress HEENT: normal Neck: no JVD Cardiac:  normal S1, S2; RRR; no murmur Lungs:  clear to auscultation bilaterally, no wheezing, rhonchi or rales Abd:  soft, nontender, no hepatomegaly Ext: no edema, radial cath site without complication Skin: warm and dry Neuro:  moves all extremities spontaneously, no focal abnormalities noted  ASSESSMENT AND PLAN:  1. Chronic systolic CHF s/p BiV-ICD 2. Recent pre-syncope 3. HTN 4. H/o VT 2011, quiescent 5. Mild CAD by cath 09/06/14 with recent noncardiac CP  Mike Jimenez has had a tumultuous course lately. He did not tolerate CHF med titration well at all (i.e. the increase in losartan from 25mg  to 50mg  daily with baseline BP 130/80 at that time). He initially did well on Lasix but then had issues of hypotension, low-grade fever, diaphoresis and nausea. When he was in the office on 08/29/14, he had DOE at 245lb, however, today, he is at 244lb and denies any acute symptoms. The interesting thing is that when he was in the hospital on day of discharge feeling better on 09/07/14, his weight was all the way down to 237lb. Blood cultures in the hospital were negative. Even with Bi-V pacing his EF never improved and now we don't seem able to advance his regimen at all. I have not made any med changes today since he is feeling better. I think he would benefit from evaluation in the Advanced Heart Failure clinic since his case is complex, and we will refer him. I hope he follows up with PCP as instructed to discuss the night sweats as I wonder if there is something else underlying here.  Dispo: To see Dr. Haroldine Laws in CHF clinic in 1 month. He also has f/u planned at the end of this month to send in remote device transmission to the device clinic.  Signed, Melina Copa, PA-C  09/13/2014 4:31 PM

## 2014-09-23 ENCOUNTER — Ambulatory Visit (INDEPENDENT_AMBULATORY_CARE_PROVIDER_SITE_OTHER): Payer: 59 | Admitting: *Deleted

## 2014-09-23 DIAGNOSIS — I429 Cardiomyopathy, unspecified: Secondary | ICD-10-CM

## 2014-09-23 DIAGNOSIS — I428 Other cardiomyopathies: Secondary | ICD-10-CM

## 2014-09-23 DIAGNOSIS — I5022 Chronic systolic (congestive) heart failure: Secondary | ICD-10-CM

## 2014-09-23 NOTE — Progress Notes (Signed)
Remote ICD transmission.   

## 2014-09-25 LAB — MDC_IDC_ENUM_SESS_TYPE_REMOTE
Brady Statistic AP VP Percent: 0.24 %
Brady Statistic AP VS Percent: 0.02 %
Brady Statistic AS VS Percent: 2.24 %
Brady Statistic RA Percent Paced: 0.25 %
Brady Statistic RV Percent Paced: 97.74 %
HIGH POWER IMPEDANCE MEASURED VALUE: 52 Ohm
HighPow Impedance: 40 Ohm
Lead Channel Impedance Value: 380 Ohm
Lead Channel Impedance Value: 418 Ohm
Lead Channel Pacing Threshold Amplitude: 1 V
Lead Channel Pacing Threshold Pulse Width: 0.4 ms
Lead Channel Sensing Intrinsic Amplitude: 12.125 mV
Lead Channel Sensing Intrinsic Amplitude: 12.125 mV
Lead Channel Sensing Intrinsic Amplitude: 2.375 mV
Lead Channel Sensing Intrinsic Amplitude: 2.375 mV
Lead Channel Setting Pacing Amplitude: 2.5 V
Lead Channel Setting Pacing Amplitude: 2.5 V
Lead Channel Setting Pacing Pulse Width: 0.4 ms
Lead Channel Setting Pacing Pulse Width: 0.4 ms
Lead Channel Setting Sensing Sensitivity: 0.3 mV
MDC IDC MSMT BATTERY VOLTAGE: 2.93 V
MDC IDC MSMT LEADCHNL LV IMPEDANCE VALUE: 532 Ohm
MDC IDC MSMT LEADCHNL LV IMPEDANCE VALUE: 798 Ohm
MDC IDC MSMT LEADCHNL RV IMPEDANCE VALUE: 342 Ohm
MDC IDC SESS DTM: 20151026194346
MDC IDC SET LEADCHNL RV PACING AMPLITUDE: 2.5 V
MDC IDC SET ZONE DETECTION INTERVAL: 250 ms
MDC IDC SET ZONE DETECTION INTERVAL: 350 ms
MDC IDC STAT BRADY AS VP PERCENT: 97.5 %
Zone Setting Detection Interval: 280 ms
Zone Setting Detection Interval: 320 ms
Zone Setting Detection Interval: 450 ms

## 2014-10-23 ENCOUNTER — Telehealth: Payer: Self-pay | Admitting: *Deleted

## 2014-10-23 NOTE — Telephone Encounter (Signed)
Pt states "in his mind, he has already switched PCP from [facility] to [facility].  I see in his chart, he saw [name] for his annual exam in February 2015.  He has seen [facility] several times over 2015.  Does he need a "New Patient Appointment" to switch to [facility], or is it OK to make [name] his PCP?

## 2014-10-23 NOTE — Telephone Encounter (Signed)
Please put me as his PCP. Schedule a physical for February 2015, sooner if he has any issues.

## 2014-10-23 NOTE — Telephone Encounter (Signed)
PCP has been changed to [facility].  Pt has appointment with [name] 10/30/2014, "F/U on Anemia"

## 2014-10-28 ENCOUNTER — Other Ambulatory Visit: Payer: Self-pay

## 2014-10-30 ENCOUNTER — Ambulatory Visit: Payer: 59 | Admitting: Internal Medicine

## 2014-11-07 ENCOUNTER — Encounter (HOSPITAL_COMMUNITY): Payer: Self-pay | Admitting: Cardiovascular Disease

## 2014-11-19 IMAGING — CR DG CHEST 2V
2 series · 2 of 2 positions shown · non-contrast
Comparison: September 03, 2014.

CLINICAL DATA: Hypertension.  Weakness.

EXAM:
CHEST  2 VIEW

[w chest pa]
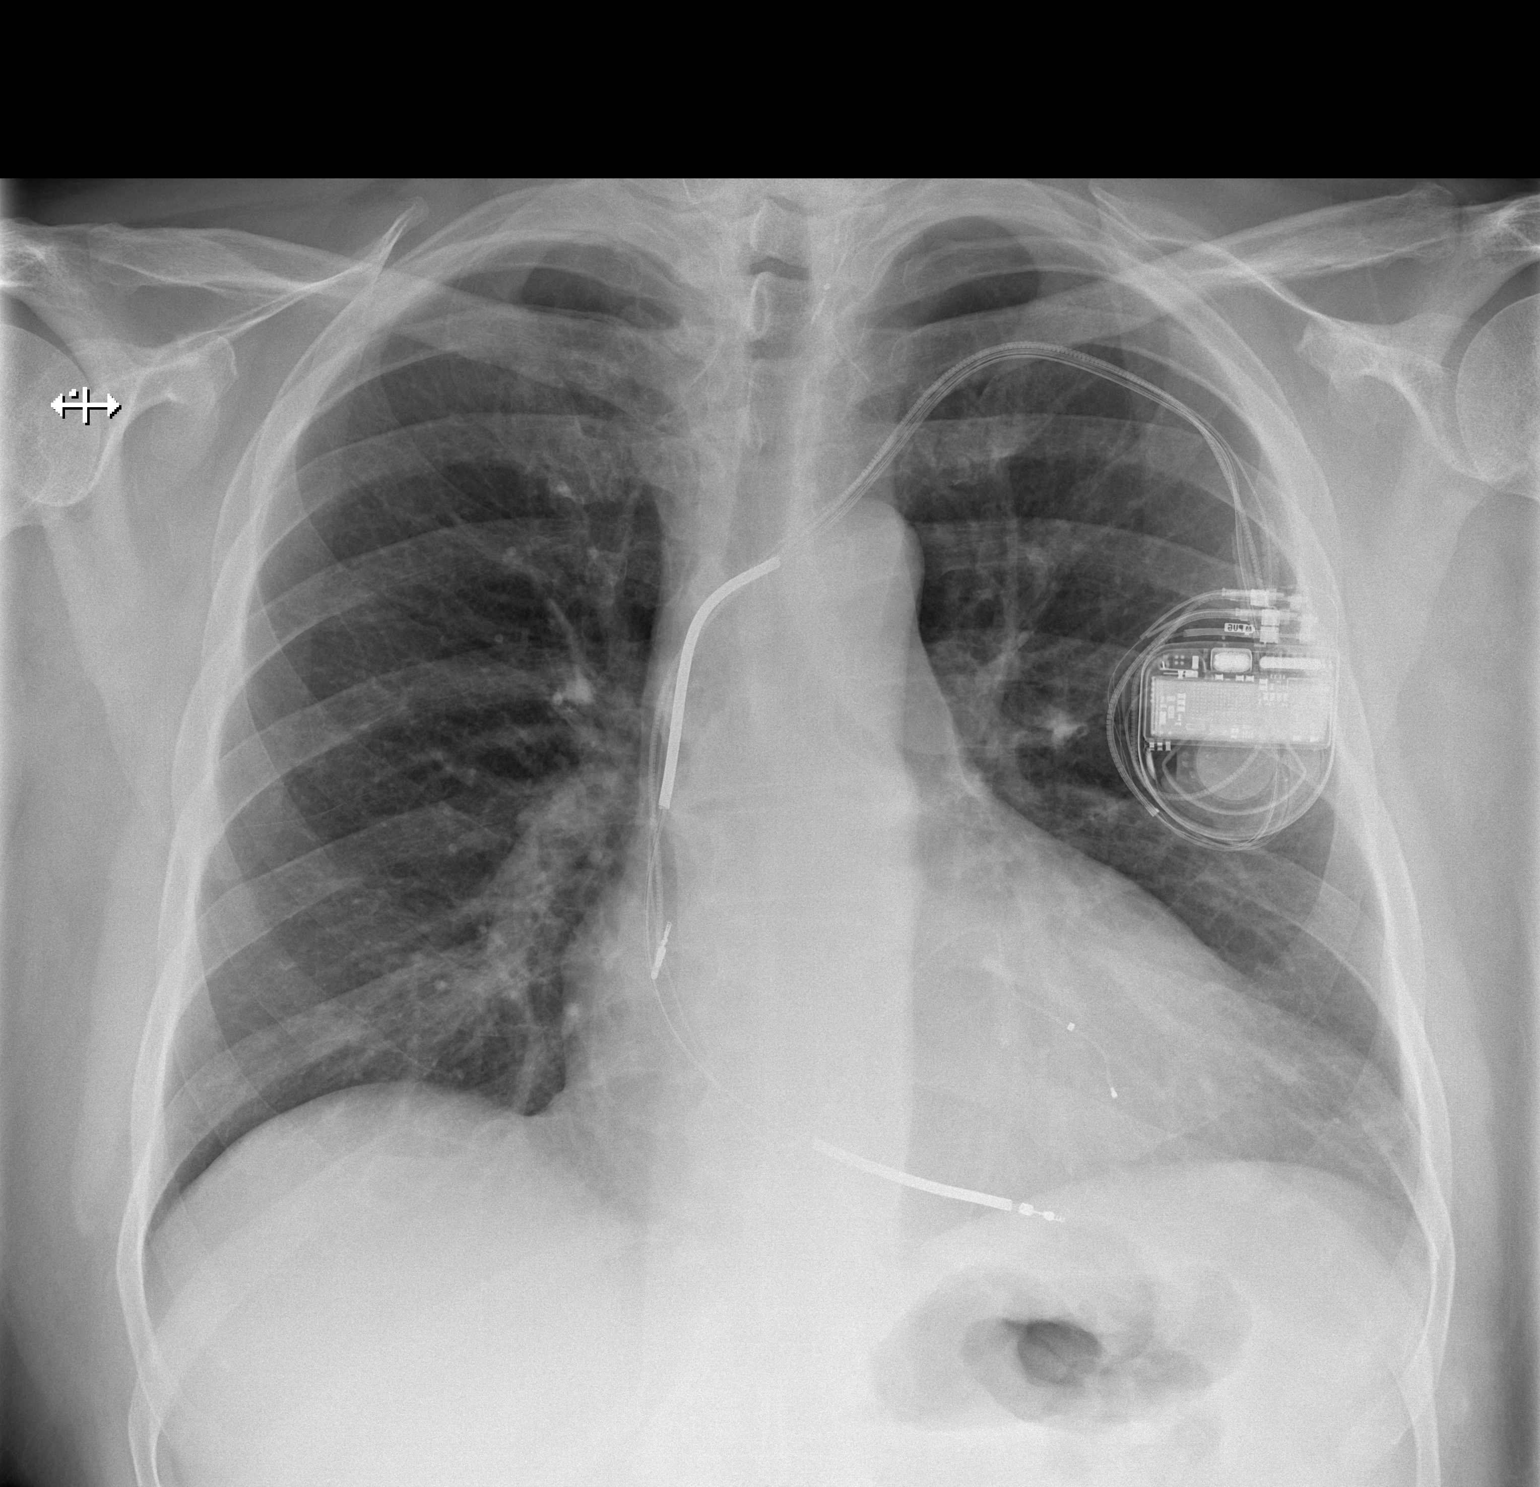

[w chest lat]
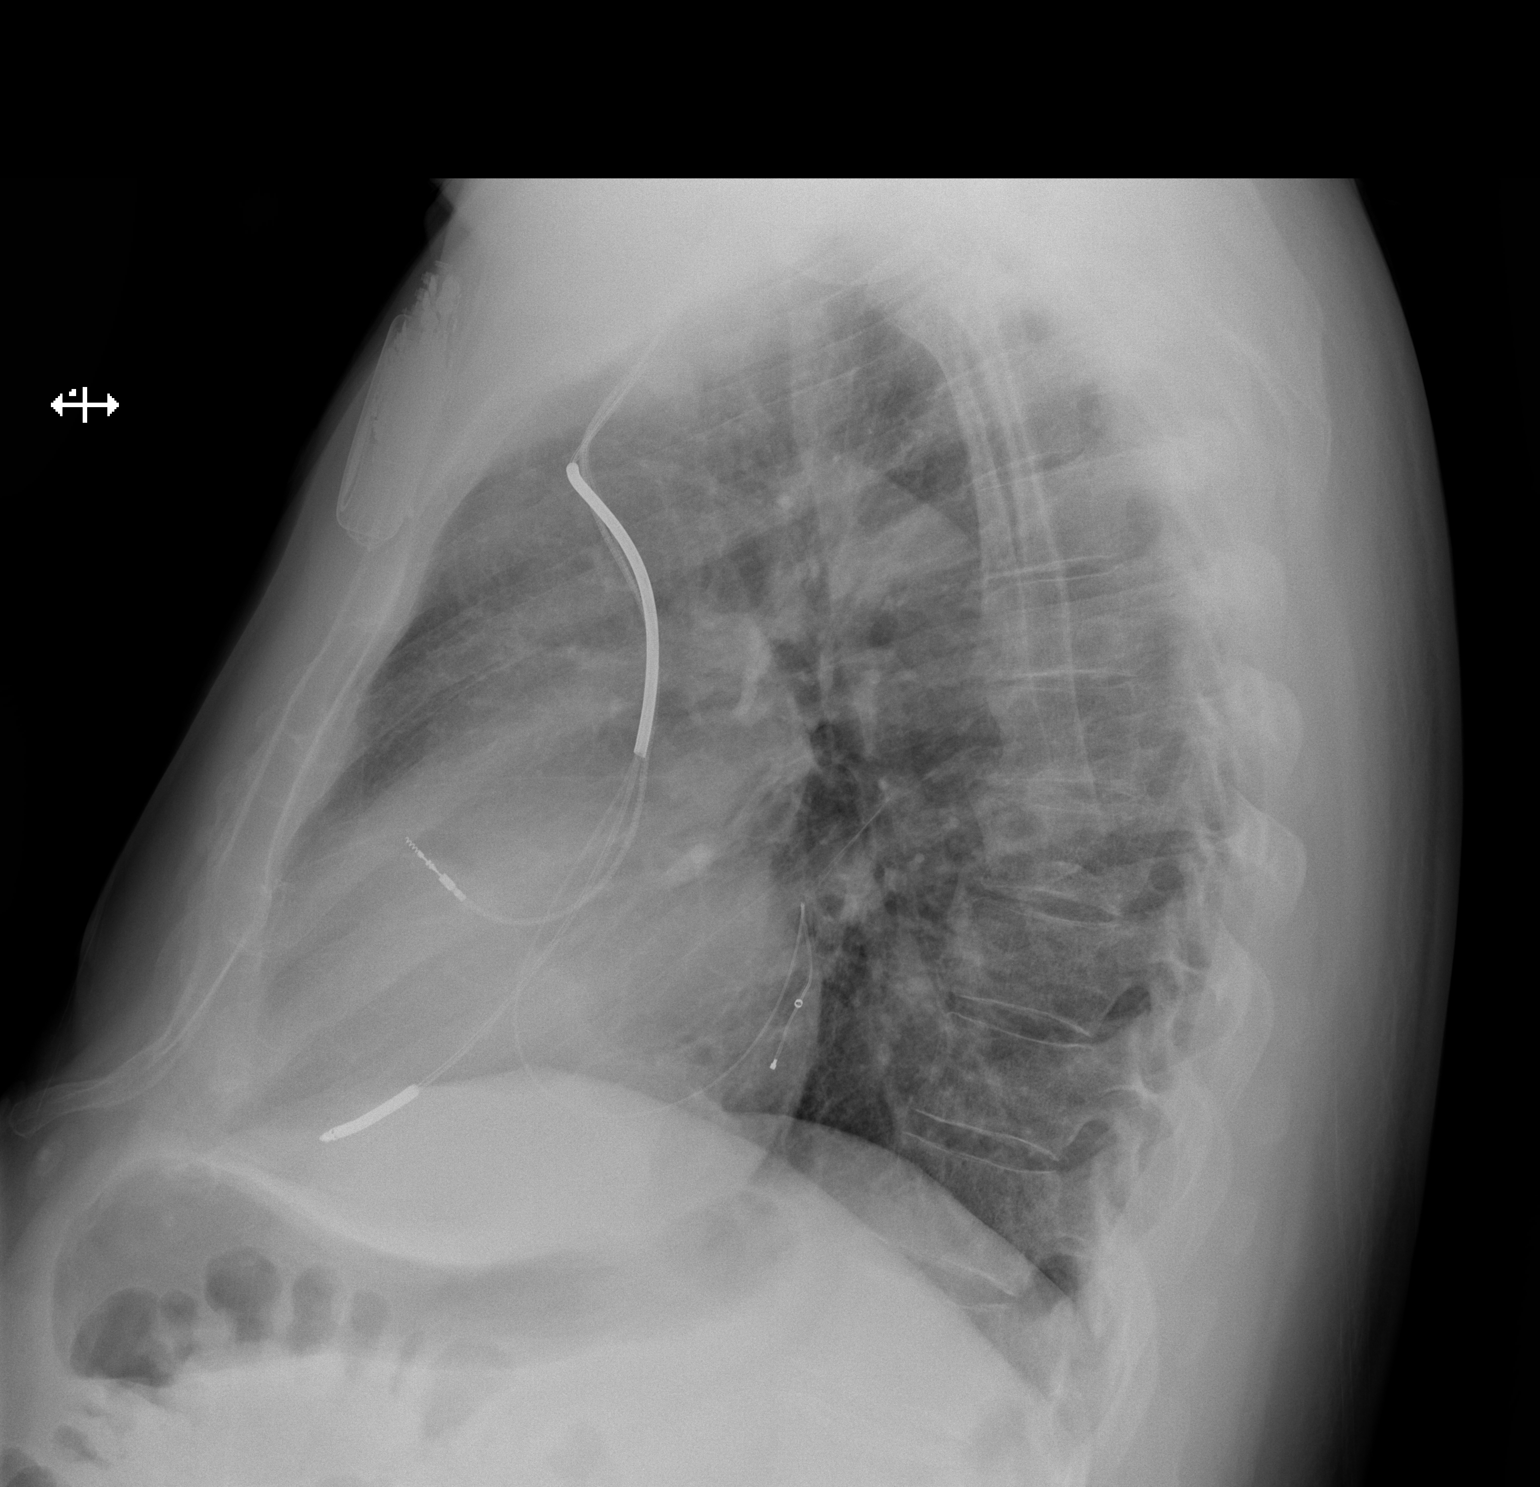

[2 of 2 positions shown; findings below may reference images not displayed]

FINDINGS: The heart size and mediastinal contours are within normal limits.
Both lungs are clear. Left-sided pacemaker is unchanged in position.
No pneumothorax or pleural effusion is noted. The visualized
skeletal structures are unremarkable.
IMPRESSION: No acute cardiopulmonary abnormality seen.

## 2014-11-20 ENCOUNTER — Ambulatory Visit (INDEPENDENT_AMBULATORY_CARE_PROVIDER_SITE_OTHER): Payer: 59 | Admitting: Internal Medicine

## 2014-11-20 ENCOUNTER — Encounter: Payer: Self-pay | Admitting: Internal Medicine

## 2014-11-20 VITALS — BP 128/84 | HR 70 | Temp 97.6°F | Ht 73.0 in | Wt 252.4 lb

## 2014-11-20 DIAGNOSIS — E119 Type 2 diabetes mellitus without complications: Secondary | ICD-10-CM

## 2014-11-20 DIAGNOSIS — I428 Other cardiomyopathies: Secondary | ICD-10-CM

## 2014-11-20 DIAGNOSIS — Z23 Encounter for immunization: Secondary | ICD-10-CM

## 2014-11-20 DIAGNOSIS — C73 Malignant neoplasm of thyroid gland: Secondary | ICD-10-CM

## 2014-11-20 DIAGNOSIS — I429 Cardiomyopathy, unspecified: Secondary | ICD-10-CM

## 2014-11-20 LAB — HEMOGLOBIN A1C: Hgb A1c MFr Bld: 6.5 % (ref 4.6–6.5)

## 2014-11-20 LAB — BASIC METABOLIC PANEL
BUN: 16 mg/dL (ref 6–23)
CO2: 28 meq/L (ref 19–32)
Calcium: 9.5 mg/dL (ref 8.4–10.5)
Chloride: 105 mEq/L (ref 96–112)
Creatinine, Ser: 1 mg/dL (ref 0.4–1.5)
GFR: 78.45 mL/min (ref >60.00)
Glucose, Bld: 106 mg/dL — ABNORMAL HIGH (ref 70–99)
Potassium: 4.5 mEq/L (ref 3.5–5.1)
Sodium: 141 mEq/L (ref 135–145)

## 2014-11-20 NOTE — Assessment & Plan Note (Signed)
Continue metformin, check A1c

## 2014-11-20 NOTE — Progress Notes (Signed)
Subjective:    Patient ID: Mike Jimenez, male    DOB: March 11, 1951, 63 y.o.   MRN: [id number]  DOS:  11/20/2014 Type of visit - description : Follow-up Interval history: since the last time I saw him 07-2014, he was diagnosed with acute on chronic CHF, initially managed as an outpatient. He was eventually admitted to the hospital 09/05/2014 with diaphoresis, weakness, chest pain. He was diuresed, cardiac catheterization showed an EF of 20% with global hypokinesis, + nonobstructive CAD. He also had night sweats and low-grade fevers, blood cultures were negative. He was seen at the cardiology office 09/13/2014, he was noted not to tolerate an increase in losartan. He was referred to the advanced heart failure clinic. I don't believe he has been seen there as of yet   ROS  in the last few weeks he is doing well. Denies night sweats. No chest pain, SOB, lower extremity edema or orthopnea No nausea or vomiting. Ambulatory blood sugars usually in the low 100. Ambulatory BPs in the 120s. Current weight at home  around 250 pounds  Past Medical History  Diagnosis Date  . Hyperlipidemia   . Hypertension   . Anemia   . NICM (nonischemic cardiomyopathy)     a. Dx 2011 - presented with sustained VT. Normal coronaries at that time, EF 10-15% by cath and 20-25% by echo. b. s/p biventricular ICD 2011. c. CP 08/2014: mild nonobstructive CAD.  Marland Kitchen Gastric ulcer 2011  . Ventricular tachycardia     a. Sustained VT 2011 s/p MDT [id number] Concert BiV ICD, ser# [id number] H.  . Postsurgical hypothyroidism   . Obesity   . Torn meniscus   . LBBB (left bundle branch block)   . Automatic implantable cardioverter-defibrillator in situ   . CHF (congestive heart failure)   . Type II diabetes mellitus   . Childhood asthma   . GERD (gastroesophageal reflux disease)   . Arthritis     "knees" (09/05/2014)  . History of gout   . Thyroid cancer     a. 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 Dr Harlow Asa.  s/p thyroidectomy 2013.  Marland Kitchen CAD (coronary artery disease)     a. Mild nonobstructive CAD by cath 08/2014.    Past Surgical History  Procedure Laterality Date  . Ptx      traumatic, age 67 months  . Vasectomy    . Bi-ventricular implantable cardioverter defibrillator  (crt-d)  03/2010  . Thyroidectomy  12/10/2011    Procedure: TOTAL THYROIDECTOMY WITH CENTRAL COMPARTMENT DISSECTION;  Surgeon: Earnstine Regal, MD;  Location: [facility];  Service: General;  Laterality: N/A;  Total thyroidectomy with limited central compartment lymph node dissection.  . Cardiac catheterization  03/2010  . Left heart catheterization with coronary angiogram N/A 09/06/2014    Procedure: LEFT HEART CATHETERIZATION WITH CORONARY ANGIOGRAM;  Surgeon: Burnell Blanks, MD;  Location[facility];  Service: Cardiovascular;  Laterality: N/A;    History   Social History  . Marital Status: Married    Spouse Name: N/A    Number of Children: 2  . Years of Education: N/A   Occupational History  . Warehouse work    Social History Main Topics  . Smoking status: Former Smoker -- 2.50 packs/day for 2 years    Types: Cigarettes    Quit date: 11/29/1970  . Smokeless tobacco: Former Systems developer    Types: Chew  . Alcohol Use: Yes     Comment: 09/05/2014 "2 drinks q couple months, if that  .  Drug Use: No  . Sexual Activity: Yes   Other Topics Concern  . Not on file   Social History Narrative   Live w/ wife , daughter lives w/ them - in [place]   6 dogs, fish tank.   Unloads trucks for a living.   Does not routinely exercise.        Medication List       This list is accurate as of: 11/20/14  6:17 PM.  Always use your most recent med list.               aspirin EC 81 MG tablet  Take 1 tablet (81 mg total) by mouth daily.     atorvastatin 20 MG tablet  Commonly known as:  LIPITOR  Take 1 tablet (20 mg total) by mouth daily at 6 PM.     carvedilol 6.25 MG tablet  Commonly known as:  COREG  Take 6.25 mg by mouth  2 (two) times daily with a meal.     Fish Oil 1000 MG Caps  Take 1 capsule by mouth daily.     furosemide 40 MG tablet  Commonly known as:  LASIX  Take 0.5 tablets (20 mg total) by mouth as needed. Changed dosage 09/03/2014 per ED MD     levothyroxine 112 MCG tablet  Commonly known as:  SYNTHROID, LEVOTHROID  Take 224 mcg by mouth daily. Take 2 tablets daily     losartan 25 MG tablet  Commonly known as:  COZAAR  Take 1 tablet (25 mg total) by mouth daily.     metFORMIN 1000 MG tablet  Commonly known as:  GLUCOPHAGE  Take 1,000 mg by mouth 2 (two) times daily with a meal.     multivitamin tablet  Take 1 tablet by mouth daily.     POTASSIUM CHLORIDE PO  (OTC) Take 1 capsule once a day     saw palmetto 80 MG capsule  Take 80 mg by mouth daily.     tadalafil 20 MG tablet  Commonly known as:  CIALIS  Take 20 mg by mouth daily as needed for erectile dysfunction.     vitamin E 400 UNIT capsule  Take 400 Units by mouth daily.           Objective:   Physical Exam BP 128/84 mmHg  Pulse 70  Temp(Src) 97.6 F (36.4 C) (Oral)  Ht 6\' 1"  (1.854 m)  Wt 252 lb 6 oz (114.477 kg)  BMI 33.30 kg/m2  SpO2 98% General -- alert, well-developed, NAD.  Neck --no JVD at 45 HEENT-- Not pale.   Lungs -- normal respiratory effort, no intercostal retractions, no accessory muscle use, and normal breath sounds.  Heart-- normal rate, regular rhythm, no murmur.  Extremities-- no pretibial edema bilaterally  Neurologic--  alert & oriented X3. Speech normal, gait appropriate for age, strength symmetric and appropriate for age.  Psych-- Cognition and judgment appear intact. Cooperative with normal attention span and concentration. No anxious or depressed appearing.     Assessment & Plan:

## 2014-11-20 NOTE — Progress Notes (Signed)
Pre visit review using our clinic review tool, if applicable. No additional management support is needed unless otherwise documented below in the visit note. 

## 2014-11-20 NOTE — Assessment & Plan Note (Signed)
Seems to be stable on Lasix 40 mg half tablet daily, Coreg, losartan 25 mg. Was told that his ideal weight was 245 pounds, currently 250. Recommend to watch his diet, salt and water intake. Check a BMP Consultation with the advance CHF clinic pending.

## 2014-11-20 NOTE — Assessment & Plan Note (Signed)
F/u by Dr Buddy Duty, sees him ~ 2 times a year, Will fax to him the recent thyroglobulin result which is a slightly elevated

## 2014-11-20 NOTE — Patient Instructions (Signed)
Get your blood work before you leave   continue checking your weight daily, per cardiology recommendation should  be around 245 lb. Watch your  diet, fluid and salt intake.   Please come back to the office in 3 months  for a routine check up

## 2014-12-05 ENCOUNTER — Other Ambulatory Visit: Payer: Self-pay

## 2014-12-05 MED ORDER — CARVEDILOL 6.25 MG PO TABS: 6.2500 mg | ORAL_TABLET | Freq: Two times a day (BID) | ORAL | Status: DC

## 2014-12-11 ENCOUNTER — Ambulatory Visit (INDEPENDENT_AMBULATORY_CARE_PROVIDER_SITE_OTHER): Payer: 59 | Admitting: Internal Medicine

## 2014-12-11 ENCOUNTER — Encounter: Payer: Self-pay | Admitting: Internal Medicine

## 2014-12-11 VITALS — BP 124/68 | HR 65 | Temp 97.6°F | Ht 73.0 in | Wt 253.5 lb

## 2014-12-11 DIAGNOSIS — Z8739 Personal history of other diseases of the musculoskeletal system and connective tissue: Secondary | ICD-10-CM

## 2014-12-11 DIAGNOSIS — M109 Gout, unspecified: Secondary | ICD-10-CM

## 2014-12-11 DIAGNOSIS — M1009 Idiopathic gout, multiple sites: Secondary | ICD-10-CM

## 2014-12-11 MED ORDER — PREDNISONE 10 MG PO TABS: ORAL_TABLET | ORAL | Status: DC

## 2014-12-11 MED ORDER — HYDROCODONE-ACETAMINOPHEN 5-325 MG PO TABS: 1.0000 | ORAL_TABLET | Freq: Three times a day (TID) | ORAL | Status: DC | PRN

## 2014-12-11 MED ORDER — ALLOPURINOL 100 MG PO TABS: 100.0000 mg | ORAL_TABLET | Freq: Every day | ORAL | Status: DC

## 2014-12-11 NOTE — Progress Notes (Signed)
Subjective:    Patient ID: Mike Jimenez, male    DOB: December 22, 1950, 63 y.o.   MRN: [id number]  DOS:  12/11/2014 Type of visit - description : Acute visit Interval history: Symptoms started a few days ago initially at the left great toe, then right great toe and in the last 2 days left ankle. Area is very tender, slightly warm. Symptoms similar to previous episode of gout   ROS Denies fever, chills. No injury or falls.   Past Medical History  Diagnosis Date  . Hyperlipidemia   . Hypertension   . Anemia   . NICM (nonischemic cardiomyopathy)     a. Dx 2011 - presented with sustained VT. Normal coronaries at that time, EF 10-15% by cath and 20-25% by echo. b. s/p biventricular ICD 2011. c. CP 08/2014: mild nonobstructive CAD.  Marland Kitchen Gastric ulcer 2011  . Ventricular tachycardia     a. Sustained VT 2011 s/p MDT [id number] Concert BiV ICD, ser# [id number] H.  . Postsurgical hypothyroidism   . Obesity   . Torn meniscus   . LBBB (left bundle branch block)   . Automatic implantable cardioverter-defibrillator in situ   . CHF (congestive heart failure)   . Type II diabetes mellitus   . Childhood asthma   . GERD (gastroesophageal reflux disease)   . Arthritis     "knees" (09/05/2014)  . History of gout   . Thyroid cancer     a. 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 Dr Harlow Asa. s/p thyroidectomy 2013.  Marland Kitchen CAD (coronary artery disease)     a. Mild nonobstructive CAD by cath 08/2014.    Past Surgical History  Procedure Laterality Date  . Ptx      traumatic, age 51 months  . Vasectomy    . Bi-ventricular implantable cardioverter defibrillator  (crt-d)  03/2010  . Thyroidectomy  12/10/2011    Procedure: TOTAL THYROIDECTOMY WITH CENTRAL COMPARTMENT DISSECTION;  Surgeon: Earnstine Regal, MD;  Location: [facility];  Service: General;  Laterality: N/A;  Total thyroidectomy with limited central compartment lymph node dissection.  . Cardiac catheterization  03/2010  . Left heart catheterization  with coronary angiogram N/A 09/06/2014    Procedure: LEFT HEART CATHETERIZATION WITH CORONARY ANGIOGRAM;  Surgeon: Burnell Blanks, MD;  Location: [facility] CATH LAB;  Service: Cardiovascular;  Laterality: N/A;    History   Social History  . Marital Status: Married    Spouse Name: N/A    Number of Children: 2  . Years of Education: N/A   Occupational History  . Warehouse work    Social History Main Topics  . Smoking status: Former Smoker -- 2.50 packs/day for 2 years    Types: Cigarettes    Quit date: 11/29/1970  . Smokeless tobacco: Former Systems developer    Types: Chew  . Alcohol Use: Yes     Comment: 09/05/2014 "2 drinks q couple months, if that  . Drug Use: No  . Sexual Activity: Yes   Other Topics Concern  . Not on file   Social History Narrative   Live w/ wife , daughter lives w/ them - in [place]   6 dogs, fish tank.   Unloads trucks for a living.   Does not routinely exercise.        Medication List       This list is accurate as of: 12/11/14  6:20 PM.  Always use your most recent med list.  allopurinol 100 MG tablet  Commonly known as:  ZYLOPRIM  Take 1 tablet (100 mg total) by mouth daily.     aspirin EC 81 MG tablet  Take 1 tablet (81 mg total) by mouth daily.     atorvastatin 20 MG tablet  Commonly known as:  LIPITOR  Take 1 tablet (20 mg total) by mouth daily at 6 PM.     carvedilol 6.25 MG tablet  Commonly known as:  COREG  Take 1 tablet (6.25 mg total) by mouth 2 (two) times daily with a meal.     Fish Oil 1000 MG Caps  Take 1 capsule by mouth daily.     furosemide 40 MG tablet  Commonly known as:  LASIX  Take 0.5 tablets (20 mg total) by mouth as needed. Changed dosage 09/03/2014 per ED MD     HYDROcodone-acetaminophen 5-325 MG per tablet  Commonly known as:  NORCO/VICODIN  Take 1-2 tablets by mouth every 8 (eight) hours as needed.     levothyroxine 112 MCG tablet  Commonly known as:  SYNTHROID, LEVOTHROID  Take 224 mcg by mouth  daily. Take 2 tablets daily     losartan 25 MG tablet  Commonly known as:  COZAAR  Take 1 tablet (25 mg total) by mouth daily.     metFORMIN 1000 MG tablet  Commonly known as:  GLUCOPHAGE  Take 1,000 mg by mouth 2 (two) times daily with a meal.     multivitamin tablet  Take 1 tablet by mouth daily.     POTASSIUM CHLORIDE PO  (OTC) Take 1 capsule once a day     predniSONE 10 MG tablet  Commonly known as:  DELTASONE  2 tablets daily for 4 days, then 1 tablet daily for 4 days     saw palmetto 80 MG capsule  Take 80 mg by mouth daily.     tadalafil 20 MG tablet  Commonly known as:  CIALIS  Take 20 mg by mouth daily as needed for erectile dysfunction.     vitamin E 400 UNIT capsule  Take 400 Units by mouth daily.           Objective:   Physical Exam BP 124/68 mmHg  Pulse 65  Temp(Src) 97.6 F (36.4 C) (Oral)  Ht 6\' 1"  (1.854 m)  Wt 253 lb 8 oz (114.987 kg)  BMI 33.45 kg/m2  SpO2 98% General -- alert, well-developed, NAD.    Extremities-- Left foot, base of the great toe slightly tender, minimal swelling, no red or warm Right foot, great toe essentially normal Left ankle: Swollen, slightly warm and red. Alert and pain with passive range of motion.. Neurologic--  alert & oriented X3.   Psych-- Cognition and judgment appear intact. Cooperative with normal attention span and concentration. No anxious or depressed appearing.        Assessment & Plan:

## 2014-12-11 NOTE — Assessment & Plan Note (Signed)
Symptoms consistent with gout, the problem has been silent for many years, currently taking Lasix and given cardiovascular problems; I don't think we should stop it. Plan: check a uric Start allopurinol Symptomatic treatment with prednisone with close monitoring of CBGs, see instructions Hydrocodone as needed, drowsiness discuss

## 2014-12-11 NOTE — Progress Notes (Signed)
Pre visit review using our clinic review tool, if applicable. No additional management support is needed unless otherwise documented below in the visit note. 

## 2014-12-11 NOTE — Patient Instructions (Signed)
Get your blood work before you leave    Start allopurinol 1 tablet daily  Take prednisone as recommended for the next 8 days While  taking prednisone, check your blood sugar daily, if the blood sugar is more than 200: Stop prednisone and call us   Vicodin as needed for pain Get the left leg elevated and iced the ankle  Call if the pain is severe, you have fever, chills or the redness increases.

## 2014-12-25 ENCOUNTER — Encounter: Payer: Self-pay | Admitting: Internal Medicine

## 2014-12-25 ENCOUNTER — Ambulatory Visit (INDEPENDENT_AMBULATORY_CARE_PROVIDER_SITE_OTHER): Payer: 59 | Admitting: *Deleted

## 2014-12-25 DIAGNOSIS — I429 Cardiomyopathy, unspecified: Secondary | ICD-10-CM

## 2014-12-25 DIAGNOSIS — I428 Other cardiomyopathies: Secondary | ICD-10-CM

## 2014-12-25 DIAGNOSIS — I5022 Chronic systolic (congestive) heart failure: Secondary | ICD-10-CM

## 2014-12-25 NOTE — Progress Notes (Signed)
Remote ICD transmission.   

## 2014-12-30 ENCOUNTER — Telehealth: Payer: Self-pay | Admitting: Internal Medicine

## 2014-12-30 DIAGNOSIS — M109 Gout, unspecified: Secondary | ICD-10-CM

## 2014-12-30 LAB — MDC_IDC_ENUM_SESS_TYPE_REMOTE
Brady Statistic AP VP Percent: 0.79 %
Brady Statistic AS VP Percent: 95.43 %
Date Time Interrogation Session: 20160127182824
HIGH POWER IMPEDANCE MEASURED VALUE: 44 Ohm
HIGH POWER IMPEDANCE MEASURED VALUE: 55 Ohm
Lead Channel Impedance Value: 532 Ohm
Lead Channel Impedance Value: 779 Ohm
Lead Channel Pacing Threshold Amplitude: 1.125 V
Lead Channel Pacing Threshold Pulse Width: 0.4 ms
Lead Channel Sensing Intrinsic Amplitude: 15.75 mV
Lead Channel Sensing Intrinsic Amplitude: 2.5 mV
Lead Channel Setting Pacing Amplitude: 2.5 V
Lead Channel Setting Pacing Pulse Width: 0.4 ms
MDC IDC MSMT BATTERY VOLTAGE: 2.89 V
MDC IDC MSMT LEADCHNL LV IMPEDANCE VALUE: 380 Ohm
MDC IDC MSMT LEADCHNL RA IMPEDANCE VALUE: 437 Ohm
MDC IDC MSMT LEADCHNL RV IMPEDANCE VALUE: 342 Ohm
MDC IDC SET LEADCHNL LV PACING AMPLITUDE: 2.75 V
MDC IDC SET LEADCHNL LV PACING PULSEWIDTH: 0.4 ms
MDC IDC SET LEADCHNL RA PACING AMPLITUDE: 2.5 V
MDC IDC SET LEADCHNL RV SENSING SENSITIVITY: 0.3 mV
MDC IDC SET ZONE DETECTION INTERVAL: 250 ms
MDC IDC SET ZONE DETECTION INTERVAL: 280 ms
MDC IDC SET ZONE DETECTION INTERVAL: 320 ms
MDC IDC STAT BRADY AP VS PERCENT: 0.02 %
MDC IDC STAT BRADY AS VS PERCENT: 3.76 %
MDC IDC STAT BRADY RA PERCENT PACED: 0.81 %
MDC IDC STAT BRADY RV PERCENT PACED: 96.22 %
Zone Setting Detection Interval: 350 ms
Zone Setting Detection Interval: 450 ms

## 2014-12-30 NOTE — Telephone Encounter (Signed)
Caller name: majour Relation to pt: self Call back number: [phone] Pharmacy:  Reason for call:   Patient states that he forgot to do labs when he was in at last visit. He wants to know if he still needs to do labs. No labs have been entered.

## 2014-12-30 NOTE — Telephone Encounter (Signed)
Pt was supposed to get Uric acid levels checked last visit. I will place orders for Uric acid. Ask Pt to return at his earliest convenience to have this done. Thank you!

## 2014-12-30 NOTE — Telephone Encounter (Signed)
Uric acid lab ordered

## 2014-12-31 NOTE — Telephone Encounter (Signed)
Left message for patient to return my call.

## 2014-12-31 NOTE — Telephone Encounter (Signed)
Pt scheduled lab appointment for 01/01/14

## 2015-01-01 ENCOUNTER — Other Ambulatory Visit (INDEPENDENT_AMBULATORY_CARE_PROVIDER_SITE_OTHER): Payer: 59

## 2015-01-01 DIAGNOSIS — M109 Gout, unspecified: Secondary | ICD-10-CM

## 2015-01-01 DIAGNOSIS — M10079 Idiopathic gout, unspecified ankle and foot: Secondary | ICD-10-CM

## 2015-01-01 LAB — URIC ACID: URIC ACID, SERUM: 7.6 mg/dL (ref 4.0–7.8)

## 2015-01-02 ENCOUNTER — Encounter: Payer: Self-pay | Admitting: Cardiology

## 2015-01-07 ENCOUNTER — Encounter: Payer: Self-pay | Admitting: Internal Medicine

## 2015-01-30 ENCOUNTER — Ambulatory Visit (INDEPENDENT_AMBULATORY_CARE_PROVIDER_SITE_OTHER): Payer: 59 | Admitting: *Deleted

## 2015-01-30 ENCOUNTER — Telehealth: Payer: Self-pay | Admitting: Cardiology

## 2015-01-30 DIAGNOSIS — Z95810 Presence of automatic (implantable) cardiac defibrillator: Secondary | ICD-10-CM

## 2015-01-30 DIAGNOSIS — I5022 Chronic systolic (congestive) heart failure: Secondary | ICD-10-CM

## 2015-01-30 NOTE — Telephone Encounter (Signed)
LMOVM reminding pt to send remote transmission.   

## 2015-02-03 NOTE — Progress Notes (Signed)
EPIC Encounter for ICM Monitoring  Patient Name: Mike Jimenez is a 64 y.o. male Date: 02/03/2015 Primary Care Physican: Kathlene November, MD Primary Cardiologist: Lovena Le Electrophysiologist: Lovena Le Dry Weight: 245-250 lbs- typical weight range for the patient.   Bi-V pacing: 80.9% (was previously 70.4%)       In the past month, have you:  1. Gained more than 2 pounds in a day or more than 5 pounds in a week? no  2. Had changes in your medications (with verification of current medications)? no  3. Had more shortness of breath than is usual for you? no  4. Limited your activity because of shortness of breath? no  5. Not been able to sleep because of shortness of breath? no  6. Had increased swelling in your feet or ankles? no  7. Had symptoms of dehydration (dizziness, dry mouth, increased thirst, decreased urine output) no  8. Had changes in sodium restriction? no  9. Been compliant with medication? Yes   ICM trend:   Follow-up plan: ICM clinic phone appointment: 03/06/15  Copy of note sent to patient's primary care physician, primary cardiologist, and device following physician.  Alvis Lemmings, RN, BSN 02/03/2015 4:09 PM

## 2015-02-12 ENCOUNTER — Telehealth: Payer: Self-pay | Admitting: Internal Medicine

## 2015-02-12 DIAGNOSIS — I10 Essential (primary) hypertension: Secondary | ICD-10-CM

## 2015-02-12 NOTE — Telephone Encounter (Signed)
Routing error, please advise.

## 2015-02-12 NOTE — Telephone Encounter (Signed)
Okay 90 days, no refill on: Allopurinol, atorvastatin, carvedilol, Lasix, Synthroid, losartan, metformin

## 2015-02-12 NOTE — Telephone Encounter (Signed)
Caller name: [place]  Relation to pt: self Call back number: [phone] [facility]acy: [facility] [address]  Reason for call:   Patient wife is going to be laid off of work and is Requesting a 90 day supply of all medications.  So insurance will pay

## 2015-02-13 ENCOUNTER — Other Ambulatory Visit: Payer: Self-pay

## 2015-02-13 DIAGNOSIS — I10 Essential (primary) hypertension: Secondary | ICD-10-CM

## 2015-02-13 MED ORDER — LEVOTHYROXINE SODIUM 112 MCG PO TABS: ORAL_TABLET | ORAL | Status: DC

## 2015-02-13 MED ORDER — ALLOPURINOL 100 MG PO TABS: 100.0000 mg | ORAL_TABLET | Freq: Every day | ORAL | Status: DC

## 2015-02-13 MED ORDER — LOSARTAN POTASSIUM 25 MG PO TABS: 25.0000 mg | ORAL_TABLET | Freq: Every day | ORAL | Status: DC

## 2015-02-13 MED ORDER — CARVEDILOL 6.25 MG PO TABS: 6.2500 mg | ORAL_TABLET | Freq: Two times a day (BID) | ORAL | Status: DC

## 2015-02-13 MED ORDER — ATORVASTATIN CALCIUM 20 MG PO TABS: 20.0000 mg | ORAL_TABLET | Freq: Every day | ORAL | Status: DC

## 2015-02-13 MED ORDER — FUROSEMIDE 40 MG PO TABS: ORAL_TABLET | ORAL | Status: DC

## 2015-02-13 MED ORDER — METFORMIN HCL 1000 MG PO TABS: 1000.0000 mg | ORAL_TABLET | Freq: Two times a day (BID) | ORAL | Status: DC

## 2015-02-13 NOTE — Telephone Encounter (Signed)
Rx's sent to [facility] pharmacy as requested.

## 2015-03-06 ENCOUNTER — Encounter: Payer: 59 | Admitting: *Deleted

## 2015-03-10 ENCOUNTER — Telehealth: Payer: Self-pay | Admitting: *Deleted

## 2015-03-10 NOTE — Telephone Encounter (Signed)
ICM transmission received. I left a message for the patient to call. 

## 2015-03-11 NOTE — Telephone Encounter (Signed)
I attempted to call the patient. I left a message that I will call back in the morning.

## 2015-03-12 ENCOUNTER — Encounter: Payer: Self-pay | Admitting: *Deleted

## 2015-03-12 NOTE — Telephone Encounter (Signed)
Attempted to call the patient. I left the patient a message I will mail him the results of his ICM transmission this month.

## 2015-04-01 ENCOUNTER — Encounter: Payer: Self-pay | Admitting: *Deleted

## 2015-04-07 ENCOUNTER — Telehealth: Payer: Self-pay | Admitting: Cardiology

## 2015-04-07 ENCOUNTER — Ambulatory Visit (INDEPENDENT_AMBULATORY_CARE_PROVIDER_SITE_OTHER): Payer: Self-pay | Admitting: *Deleted

## 2015-04-07 DIAGNOSIS — I428 Other cardiomyopathies: Secondary | ICD-10-CM

## 2015-04-07 NOTE — Telephone Encounter (Signed)
Spoke with pt and reminded pt of remote transmission that is due today. Pt verbalized understanding.   

## 2015-04-08 ENCOUNTER — Encounter: Payer: Self-pay | Admitting: Internal Medicine

## 2015-04-08 DIAGNOSIS — I429 Cardiomyopathy, unspecified: Secondary | ICD-10-CM

## 2015-04-08 NOTE — Progress Notes (Signed)
Remote ICD transmission.   

## 2015-04-09 ENCOUNTER — Telehealth: Payer: Self-pay | Admitting: *Deleted

## 2015-04-09 NOTE — Telephone Encounter (Signed)
ICM transmission received. I left a message for the patient to call. 

## 2015-04-11 LAB — CUP PACEART REMOTE DEVICE CHECK
Brady Statistic AP VS Percent: 0.02 %
Brady Statistic AS VP Percent: 90.06 %
Brady Statistic AS VS Percent: 9.24 %
Brady Statistic RV Percent Paced: 90.73 %
HighPow Impedance: 40 Ohm
HighPow Impedance: 51 Ohm
Lead Channel Impedance Value: 342 Ohm
Lead Channel Impedance Value: 494 Ohm
Lead Channel Impedance Value: 741 Ohm
Lead Channel Pacing Threshold Amplitude: 1.25 V
Lead Channel Sensing Intrinsic Amplitude: 3 mV
Lead Channel Setting Pacing Amplitude: 2.75 V
Lead Channel Setting Pacing Pulse Width: 0.4 ms
Lead Channel Setting Sensing Sensitivity: 0.3 mV
MDC IDC MSMT BATTERY VOLTAGE: 2.8 V
MDC IDC MSMT LEADCHNL LV PACING THRESHOLD PULSEWIDTH: 0.4 ms
MDC IDC MSMT LEADCHNL RA IMPEDANCE VALUE: 418 Ohm
MDC IDC MSMT LEADCHNL RA SENSING INTR AMPL: 3 mV
MDC IDC MSMT LEADCHNL RV IMPEDANCE VALUE: 418 Ohm
MDC IDC MSMT LEADCHNL RV SENSING INTR AMPL: 15 mV
MDC IDC MSMT LEADCHNL RV SENSING INTR AMPL: 15 mV
MDC IDC SESS DTM: 20160510160343
MDC IDC SET LEADCHNL LV PACING PULSEWIDTH: 0.4 ms
MDC IDC SET LEADCHNL RA PACING AMPLITUDE: 2.5 V
MDC IDC SET LEADCHNL RV PACING AMPLITUDE: 2.5 V
MDC IDC SET ZONE DETECTION INTERVAL: 250 ms
MDC IDC SET ZONE DETECTION INTERVAL: 280 ms
MDC IDC SET ZONE DETECTION INTERVAL: 320 ms
MDC IDC SET ZONE DETECTION INTERVAL: 350 ms
MDC IDC STAT BRADY AP VP PERCENT: 0.68 %
MDC IDC STAT BRADY RA PERCENT PACED: 0.7 %
Zone Setting Detection Interval: 450 ms

## 2015-04-11 NOTE — Telephone Encounter (Signed)
I left a message for the patient to call. 

## 2015-04-14 ENCOUNTER — Encounter: Payer: Self-pay | Admitting: *Deleted

## 2015-04-14 NOTE — Telephone Encounter (Signed)
No call back from the patient. Letter mailed.

## 2015-04-18 ENCOUNTER — Encounter: Payer: Self-pay | Admitting: Cardiology

## 2015-05-02 ENCOUNTER — Encounter: Payer: Self-pay | Admitting: Cardiology

## 2015-05-15 ENCOUNTER — Telehealth: Payer: Self-pay | Admitting: Cardiology

## 2015-05-15 ENCOUNTER — Encounter: Payer: Self-pay | Admitting: *Deleted

## 2015-05-15 NOTE — Telephone Encounter (Signed)
LMOVM reminding pt to send remote transmission.   

## 2015-05-26 ENCOUNTER — Other Ambulatory Visit: Payer: Self-pay

## 2015-05-27 ENCOUNTER — Encounter: Payer: Self-pay | Admitting: *Deleted

## 2015-06-19 ENCOUNTER — Encounter: Payer: Self-pay | Admitting: *Deleted

## 2015-06-19 ENCOUNTER — Telehealth: Payer: Self-pay | Admitting: Cardiology

## 2015-06-19 NOTE — Telephone Encounter (Signed)
LMOVM reminding pt to send remote transmission.   

## 2015-06-24 ENCOUNTER — Encounter: Payer: Self-pay | Admitting: *Deleted

## 2015-06-30 ENCOUNTER — Ambulatory Visit (INDEPENDENT_AMBULATORY_CARE_PROVIDER_SITE_OTHER): Payer: Self-pay | Admitting: *Deleted

## 2015-06-30 ENCOUNTER — Telehealth: Payer: Self-pay | Admitting: Internal Medicine

## 2015-06-30 DIAGNOSIS — Z95810 Presence of automatic (implantable) cardiac defibrillator: Secondary | ICD-10-CM

## 2015-06-30 DIAGNOSIS — I5022 Chronic systolic (congestive) heart failure: Secondary | ICD-10-CM

## 2015-06-30 NOTE — Telephone Encounter (Signed)
Informed patient that he is overdue to see Dr.Taylor. Patient voiced understanding. Will defer scheduling to [facility]. I also informed him that he has missed a couple of ICM remote appts as well. Patient voiced understanding and stated that he would send today.

## 2015-06-30 NOTE — Telephone Encounter (Signed)
New message     Pt states he thinks he missed a remote transmission----did not have ins.  Now pt is off schedule and need to know when he is due to have a transmission. He will have insurance again in about 30 days. Please call

## 2015-07-03 ENCOUNTER — Telehealth: Payer: Self-pay | Admitting: *Deleted

## 2015-07-03 NOTE — Telephone Encounter (Signed)
ICM transmission received. I left a message for the patient to call. 

## 2015-07-04 ENCOUNTER — Encounter: Payer: Self-pay | Admitting: *Deleted

## 2015-07-04 NOTE — Telephone Encounter (Signed)
I spoke with the patient. 

## 2015-07-04 NOTE — Progress Notes (Signed)
...........  EPIC Encounter for ICM Monitoring  Patient Name: Mike Jimenez is a 64 y.o. male Date: 07/04/2015 Primary Care Physican: Kathlene November, MD Primary Cardiologist: Lovena Le Electrophysiologist: Lovena Le Dry Weight: 254 lbs       BiV Pacing: 89.5%  In the past month, have you:  1. Gained more than 2 pounds in a day or more than 5 pounds in a week? no  2. Had changes in your medications (with verification of current medications)? no  3. Had more shortness of breath than is usual for you? no  4. Limited your activity because of shortness of breath? no  5. Not been able to sleep because of shortness of breath? no  6. Had increased swelling in your feet or ankles? no  7. Had symptoms of dehydration (dizziness, dry mouth, increased thirst, decreased urine output) no  8. Had changes in sodium restriction? Patient out of town for wedding and admits some dietary indiscretion.   9. Been compliant with medication? Yes   ICM trend:    Follow-up plan: ICM clinic phone appointment Wednesday, August 06, 2015.  Optivol Reading slightly elevated from 05/29/2015 to 06/09/2015 and reports he attended a wedding out of town during that time.   Copy of note sent to patient's primary care physician, primary cardiologist, and device following physician.  Alvis Lemmings, RN, BSN 07/04/2015 4:21 PM

## 2015-07-04 NOTE — Telephone Encounter (Signed)
I left a message for the patient to call. 

## 2015-07-11 ENCOUNTER — Encounter: Payer: Self-pay | Admitting: Internal Medicine

## 2015-07-13 ENCOUNTER — Other Ambulatory Visit: Payer: Self-pay | Admitting: Internal Medicine

## 2015-07-25 ENCOUNTER — Encounter: Payer: Self-pay | Admitting: *Deleted

## 2015-08-01 ENCOUNTER — Other Ambulatory Visit: Payer: Self-pay

## 2015-08-01 MED ORDER — FUROSEMIDE 40 MG PO TABS: ORAL_TABLET | ORAL | Status: DC

## 2015-08-06 ENCOUNTER — Telehealth: Payer: Self-pay | Admitting: Cardiology

## 2015-08-06 NOTE — Telephone Encounter (Signed)
LMOVM reminding pt to send remote transmission.   

## 2015-08-18 ENCOUNTER — Other Ambulatory Visit: Payer: Self-pay

## 2015-08-19 ENCOUNTER — Other Ambulatory Visit: Payer: Self-pay | Admitting: Internal Medicine

## 2015-08-19 MED ORDER — CARVEDILOL 6.25 MG PO TABS: 6.2500 mg | ORAL_TABLET | Freq: Two times a day (BID) | ORAL | Status: DC

## 2015-08-20 ENCOUNTER — Encounter: Payer: Self-pay | Admitting: Internal Medicine

## 2015-09-02 ENCOUNTER — Other Ambulatory Visit: Payer: Self-pay | Admitting: *Deleted

## 2015-09-02 DIAGNOSIS — I10 Essential (primary) hypertension: Secondary | ICD-10-CM

## 2015-09-02 MED ORDER — LOSARTAN POTASSIUM 25 MG PO TABS: 25.0000 mg | ORAL_TABLET | Freq: Every day | ORAL | Status: DC

## 2015-09-23 ENCOUNTER — Encounter: Payer: Self-pay | Admitting: Internal Medicine

## 2015-09-27 ENCOUNTER — Other Ambulatory Visit: Payer: Self-pay | Admitting: Internal Medicine

## 2015-10-21 ENCOUNTER — Telehealth: Payer: Self-pay

## 2015-10-21 NOTE — Telephone Encounter (Signed)
Call to patient.  I reminded him a ICM transmission is due tomorrow from home.  I explained the information should transmit during the night between 12:00PM and 5AM if he sleeps by his monitor.  He stated he works late and usually does not get home until about 2:00AM and he is in bed by 3:30AM.  He stated he will send a manual transmission if needed.

## 2015-10-22 ENCOUNTER — Telehealth: Payer: Self-pay

## 2015-10-22 ENCOUNTER — Telehealth: Payer: Self-pay | Admitting: Cardiology

## 2015-10-22 NOTE — Telephone Encounter (Signed)
LMOVM reminding pt to send remote transmission.   

## 2015-10-22 NOTE — Telephone Encounter (Signed)
ICM transmission received.  Attempted call to patient and left message for return call. 

## 2015-10-28 NOTE — Telephone Encounter (Signed)
Attempted ICM call and no message.  Next remote scheduled for 12/08/2015 since he has an office check with Dr Lovena Le on 11/07/2015.  10/22/2015 impedance slightly below baseline 10/15/2015 to 10/22/2015 which may suggest fluid retention.  Patient letter sent with new transmission date and to call if experiencing any HF symptoms.    ICM trend:  10/22/2015

## 2015-11-07 ENCOUNTER — Encounter: Payer: Self-pay | Admitting: Internal Medicine

## 2015-11-07 ENCOUNTER — Ambulatory Visit (INDEPENDENT_AMBULATORY_CARE_PROVIDER_SITE_OTHER): Payer: BLUE CROSS/BLUE SHIELD | Admitting: Internal Medicine

## 2015-11-07 VITALS — BP 124/78 | HR 59 | Ht 74.0 in | Wt 259.8 lb

## 2015-11-07 DIAGNOSIS — I5022 Chronic systolic (congestive) heart failure: Secondary | ICD-10-CM | POA: Diagnosis not present

## 2015-11-07 DIAGNOSIS — Z95810 Presence of automatic (implantable) cardiac defibrillator: Secondary | ICD-10-CM

## 2015-11-07 DIAGNOSIS — I428 Other cardiomyopathies: Secondary | ICD-10-CM

## 2015-11-07 DIAGNOSIS — I2583 Coronary atherosclerosis due to lipid rich plaque: Secondary | ICD-10-CM

## 2015-11-07 DIAGNOSIS — I447 Left bundle-branch block, unspecified: Secondary | ICD-10-CM | POA: Diagnosis not present

## 2015-11-07 DIAGNOSIS — R55 Syncope and collapse: Secondary | ICD-10-CM | POA: Diagnosis not present

## 2015-11-07 DIAGNOSIS — I429 Cardiomyopathy, unspecified: Secondary | ICD-10-CM | POA: Diagnosis not present

## 2015-11-07 DIAGNOSIS — I2510 Atherosclerotic heart disease of native coronary artery without angina pectoris: Secondary | ICD-10-CM

## 2015-11-07 NOTE — Assessment & Plan Note (Signed)
His symptoms are well controlled. He is class 2. No change in his meds.

## 2015-11-07 NOTE — Assessment & Plan Note (Signed)
His Medtronic BiV device is working normally. Will recheck in several months. He will likely be at [facility].

## 2015-11-07 NOTE — Patient Instructions (Signed)
Medication Instructions:  Your physician recommends that you continue on your current medications as directed. Please refer to the Current Medication list given to you today.  Labwork: None ordered  Testing/Procedures: None ordered  Follow-Up: Your device battery is nearing replacement.  We will schedule for replacement when it is needed.  If you need a refill on your cardiac medications before your next appointment, please call your pharmacy.  Thank you for choosing [facility]!!   Janan Halter, RN [phone]

## 2015-11-07 NOTE — Progress Notes (Signed)
HPI [name] returns today for followup. He is a pleasant 64 yo man with an non-ICM, chronic systolic CHF, and VT. He is s/p BiV ICD implant. In the interim, he has had no chest pain or sob or ICD shock. No syncope. He was diagnosed Thyroid CA, and underwent surgery. He is on thyroid replacement. He has gone back to work.  Allergies  Allergen Reactions  . Ramipril Cough     Current Outpatient Prescriptions  Medication Sig Dispense Refill  . allopurinol (ZYLOPRIM) 100 MG tablet Take 1 tablet (100 mg total) by mouth daily. 90 tablet 0  . aspirin EC 81 MG tablet Take 1 tablet (81 mg total) by mouth daily.    Marland Kitchen atorvastatin (LIPITOR) 20 MG tablet Take 1 tablet (20 mg total) by mouth daily at 6 PM. 90 tablet 0  . carvedilol (COREG) 6.25 MG tablet Take 1 tablet (6.25 mg total) by mouth 2 (two) times daily with a meal. 180 tablet 0  . furosemide (LASIX) 40 MG tablet TAKE 1/2 TABLET (20 MG) BY MOUTH ONCE DAILY 15 tablet 3  . levothyroxine (SYNTHROID, LEVOTHROID) 112 MCG tablet Take 224 mcg by mouth daily.    Marland Kitchen losartan (COZAAR) 25 MG tablet Take 1 tablet (25 mg total) by mouth daily. 90 tablet 0  . metFORMIN (GLUCOPHAGE) 1000 MG tablet Take 1 tablet (1,000 mg total) by mouth 2 (two) times daily with a meal. 180 tablet 0  . Multiple Vitamin (MULTIVITAMIN) tablet Take 1 tablet by mouth daily.      . Omega-3 Fatty Acids (FISH OIL) 1000 MG CAPS Take 1 capsule by mouth daily.    Marland Kitchen POTASSIUM CHLORIDE PO (OTC) Take 1 capsule once a day    . tadalafil (CIALIS) 20 MG tablet Take 20 mg by mouth daily as needed for erectile dysfunction.    . vitamin E 400 UNIT capsule Take 400 Units by mouth daily.    . [DISCONTINUED] calcium carbonate (OS-CAL - DOSED IN MG OF ELEMENTAL CALCIUM) 1250 MG tablet Take 2 tablets (1,000 mg of elemental calcium total) by mouth 3 (three) times daily. 50 tablet 0   No current facility-administered medications for this visit.     Past Medical History  Diagnosis Date  .  Hyperlipidemia   . Hypertension   . Anemia   . NICM (nonischemic cardiomyopathy) ([place])     a. Dx 2011 - presented with sustained VT. Normal coronaries at that time, EF 10-15% by cath and 20-25% by echo. b. s/p biventricular ICD 2011. c. CP 08/2014: mild nonobstructive CAD.  Marland Kitchen Gastric ulcer 2011  . Ventricular tachycardia ([place])     a. Sustained VT 2011 s/p MDT [id number] Concert BiV ICD, ser# [id number].  . Postsurgical hypothyroidism   . Obesity   . Torn meniscus   . LBBB (left bundle branch block)   . Automatic implantable cardioverter-defibrillator in situ   . CHF (congestive heart failure) ([place])   . Type II diabetes mellitus ([place])   . Childhood asthma   . GERD (gastroesophageal reflux disease)   . Arthritis     "knees" (09/05/2014)  . History of gout   . Thyroid cancer ([place])     a. 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 Dr Harlow Asa. s/p thyroidectomy 2013.  Marland Kitchen CAD (coronary artery disease)     a. Mild nonobstructive CAD by cath 08/2014.    ROS:   All systems reviewed and negative except as noted in the HPI.   Past Surgical History  Procedure  Laterality Date  . Ptx      traumatic, age 41 months  . Vasectomy    . Bi-ventricular implantable cardioverter defibrillator  (crt-d)  03/2010  . Thyroidectomy  12/10/2011    Procedure: TOTAL THYROIDECTOMY WITH CENTRAL COMPARTMENT DISSECTION;  Surgeon: Earnstine Regal, MD;  Location: [place];  Service: General;  Laterality: N/A;  Total thyroidectomy with limited central compartment lymph node dissection.  . Cardiac catheterization  03/2010  . Left heart catheterization with coronary angiogram N/A 09/06/2014    Procedure: LEFT HEART CATHETERIZATION WITH CORONARY ANGIOGRAM;  Surgeon: Burnell Blanks, MD;  Location: [facility] CATH LAB;  Service: Cardiovascular;  Laterality: N/A;     Family History  Problem Relation Age of Onset  . Diabetes Maternal Grandfather   . Diabetes Maternal Aunt   . Colon cancer Maternal Aunt   . Colon  cancer Maternal Aunt   . Lung cancer Paternal Uncle   . Lung cancer Maternal Uncle   . Asthma Sister   . Atrial fibrillation Father   . Prostate cancer Neg Hx   . CAD Neg Hx   . Hypothyroidism Mother   . Hypothyroidism Daughter   . Hypertension Mother     alive @ 109.  Marland Kitchen Hypertension Father     alive @ 51.     Social History   Social History  . Marital Status: Married    Spouse Name: N/A  . Number of Children: 2  . Years of Education: N/A   Occupational History  . Warehouse work    Social History Main Topics  . Smoking status: Former Smoker -- 2.50 packs/day for 2 years    Types: Cigarettes    Quit date: 11/29/1970  . Smokeless tobacco: Former Systems developer    Types: Chew  . Alcohol Use: Yes     Comment: 09/05/2014 "2 drinks q couple months, if that  . Drug Use: No  . Sexual Activity: Yes   Other Topics Concern  . Not on file   Social History Narrative   Live w/ wife , daughter lives w/ them - in [place]   6 dogs, fish tank.   Unloads trucks for a living.   Does not routinely exercise.     BP 124/78 mmHg  Pulse 59  Ht 6\' 2"  (1.88 m)  Wt 259 lb 12.8 oz (117.845 kg)  BMI 33.34 kg/m2  Physical Exam:  Well appearing obese middle aged man, NAD HEENT: Unremarkable Neck:  6 cm JVD, no thyromegally Lungs:  Clear with no wheezes, rales, or rhonchi. Well healed ICD incision. HEART:  Regular rate rhythm, no murmurs, no rubs, no clicks Abd:  soft, positive bowel sounds, no organomegally, no rebound, no guarding Ext:  2 plus pulses, no edema, no cyanosis, no clubbing Skin:  No rashes no nodules Neuro:  CN II through XII intact, motor grossly intact  ECG - nsr with p synchronous biventricular pacing  DEVICE  Normal device function.  See PaceArt for details. He is approaching ERI.  Assess/Plan:

## 2015-11-07 NOTE — Assessment & Plan Note (Signed)
He denies anginal symptoms. Will follow. 

## 2015-11-19 LAB — CUP PACEART INCLINIC DEVICE CHECK
Battery Voltage: 2.63 V
Brady Statistic AP VS Percent: 0.02 %
Brady Statistic RA Percent Paced: 0.9 %
Date Time Interrogation Session: 20161209162547
HighPow Impedance: 39 Ohm
HighPow Impedance: 48 Ohm
Implantable Lead Implant Date: 20110516
Implantable Lead Implant Date: 20110516
Implantable Lead Implant Date: 20110516
Implantable Lead Location: 753860
Implantable Lead Model: 6947
Lead Channel Impedance Value: 323 Ohm
Lead Channel Impedance Value: 779 Ohm
Lead Channel Pacing Threshold Amplitude: 1 V
Lead Channel Pacing Threshold Amplitude: 1.25 V
Lead Channel Pacing Threshold Pulse Width: 0.4 ms
Lead Channel Pacing Threshold Pulse Width: 0.4 ms
Lead Channel Pacing Threshold Pulse Width: 0.4 ms
Lead Channel Sensing Intrinsic Amplitude: 10.9 mV
Lead Channel Setting Pacing Amplitude: 2.5 V
Lead Channel Setting Pacing Pulse Width: 0.4 ms
Lead Channel Setting Sensing Sensitivity: 0.3 mV
MDC IDC LEAD LOCATION: 753858
MDC IDC LEAD LOCATION: 753859
MDC IDC LEAD MODEL: 1158
MDC IDC MSMT LEADCHNL LV IMPEDANCE VALUE: 342 Ohm
MDC IDC MSMT LEADCHNL LV IMPEDANCE VALUE: 494 Ohm
MDC IDC MSMT LEADCHNL RA IMPEDANCE VALUE: 437 Ohm
MDC IDC MSMT LEADCHNL RA PACING THRESHOLD AMPLITUDE: 1.25 V
MDC IDC MSMT LEADCHNL RA SENSING INTR AMPL: 2.8 mV
MDC IDC SET LEADCHNL LV PACING AMPLITUDE: 2.5 V
MDC IDC SET LEADCHNL RV PACING AMPLITUDE: 2.5 V
MDC IDC SET LEADCHNL RV PACING PULSEWIDTH: 0.4 ms
MDC IDC STAT BRADY AP VP PERCENT: 0.88 %
MDC IDC STAT BRADY AS VP PERCENT: 94.28 %
MDC IDC STAT BRADY AS VS PERCENT: 4.82 %
MDC IDC STAT BRADY RV PERCENT PACED: 95.16 %

## 2015-11-27 ENCOUNTER — Telehealth: Payer: Self-pay | Admitting: Behavioral Health

## 2015-11-27 NOTE — Telephone Encounter (Signed)
Unable to reach patient at time of Pre-Visit Call.  Left message for patient to return call when available.    

## 2015-11-28 ENCOUNTER — Encounter: Payer: Self-pay | Admitting: Internal Medicine

## 2015-11-28 ENCOUNTER — Ambulatory Visit (INDEPENDENT_AMBULATORY_CARE_PROVIDER_SITE_OTHER): Payer: BLUE CROSS/BLUE SHIELD | Admitting: Internal Medicine

## 2015-11-28 VITALS — BP 122/74 | HR 61 | Temp 98.2°F | Ht 74.0 in | Wt 256.2 lb

## 2015-11-28 DIAGNOSIS — Z1211 Encounter for screening for malignant neoplasm of colon: Secondary | ICD-10-CM

## 2015-11-28 DIAGNOSIS — Z85850 Personal history of malignant neoplasm of thyroid: Secondary | ICD-10-CM

## 2015-11-28 DIAGNOSIS — Z114 Encounter for screening for human immunodeficiency virus [HIV]: Secondary | ICD-10-CM

## 2015-11-28 DIAGNOSIS — Z23 Encounter for immunization: Secondary | ICD-10-CM | POA: Diagnosis not present

## 2015-11-28 DIAGNOSIS — Z0000 Encounter for general adult medical examination without abnormal findings: Secondary | ICD-10-CM | POA: Diagnosis not present

## 2015-11-28 DIAGNOSIS — E119 Type 2 diabetes mellitus without complications: Secondary | ICD-10-CM | POA: Diagnosis not present

## 2015-11-28 DIAGNOSIS — M109 Gout, unspecified: Secondary | ICD-10-CM

## 2015-11-28 LAB — COMPREHENSIVE METABOLIC PANEL
ALT: 18 U/L (ref 0–53)
AST: 20 U/L (ref 0–37)
Albumin: 4.3 g/dL (ref 3.5–5.2)
Alkaline Phosphatase: 66 U/L (ref 39–117)
BILIRUBIN TOTAL: 0.8 mg/dL (ref 0.2–1.2)
BUN: 21 mg/dL (ref 6–23)
CALCIUM: 9.2 mg/dL (ref 8.4–10.5)
CHLORIDE: 106 meq/L (ref 96–112)
CO2: 28 meq/L (ref 19–32)
CREATININE: 1.08 mg/dL (ref 0.40–1.50)
GFR: 73.2 mL/min (ref >60.00)
GLUCOSE: 115 mg/dL — AB (ref 70–99)
Potassium: 3.8 mEq/L (ref 3.5–5.1)
SODIUM: 143 meq/L (ref 135–145)
Total Protein: 7.3 g/dL (ref 6.0–8.3)

## 2015-11-28 LAB — LIPID PANEL
CHOL/HDL RATIO: 7
Cholesterol: 223 mg/dL — ABNORMAL HIGH (ref 0–200)
HDL: 33.5 mg/dL — ABNORMAL LOW (ref >39.00)
LDL CALC: 163 mg/dL — AB (ref 0–99)
NONHDL: 189.21
TRIGLYCERIDES: 129 mg/dL (ref 0.0–149.0)
VLDL: 25.8 mg/dL (ref 0.0–40.0)

## 2015-11-28 LAB — CBC WITH DIFFERENTIAL/PLATELET
BASOS ABS: 0 10*3/uL (ref 0.0–0.1)
Basophils Relative: 0.5 % (ref 0.0–3.0)
EOS ABS: 0.2 10*3/uL (ref 0.0–0.7)
Eosinophils Relative: 4.4 % (ref 0.0–5.0)
HEMATOCRIT: 37.5 % — AB (ref 39.0–52.0)
Hemoglobin: 12.6 g/dL — ABNORMAL LOW (ref 13.0–17.0)
LYMPHS PCT: 21.4 % (ref 12.0–46.0)
Lymphs Abs: 0.9 10*3/uL (ref 0.7–4.0)
MCHC: 33.5 g/dL (ref 30.0–36.0)
MCV: 91.2 fl (ref 78.0–100.0)
MONOS PCT: 11.7 % (ref 3.0–12.0)
Monocytes Absolute: 0.5 10*3/uL (ref 0.1–1.0)
NEUTROS ABS: 2.7 10*3/uL (ref 1.4–7.7)
Neutrophils Relative %: 62 % (ref 43.0–77.0)
PLATELETS: 232 10*3/uL (ref 150.0–400.0)
RBC: 4.11 Mil/uL — AB (ref 4.22–5.81)
RDW: 13.4 % (ref 11.5–15.5)
WBC: 4.3 10*3/uL (ref 4.0–10.5)

## 2015-11-28 LAB — HEMOGLOBIN A1C: Hgb A1c MFr Bld: 6.4 % (ref 4.6–6.5)

## 2015-11-28 LAB — URIC ACID: URIC ACID, SERUM: 8.9 mg/dL — AB (ref 4.0–7.8)

## 2015-11-28 LAB — TSH: TSH: 0.16 u[IU]/mL — AB (ref 0.35–4.50)

## 2015-11-28 NOTE — Patient Instructions (Signed)
BEFORE YOU LEAVE THE OFFICE:  GO TO THE LAB  Get the blood work    GO TO THE FRONT DESK Schedule a routine office visit or check up to be done in 3-4 months.   Please be fasting  We can also see you in the afternoon and check labs the next day Front desk:    30     Think about immunization call Zostavax to prevent shingles

## 2015-11-28 NOTE — Progress Notes (Signed)
Pre visit review using our clinic review tool, if applicable. No additional management support is needed unless otherwise documented below in the visit note. 

## 2015-11-28 NOTE — Progress Notes (Signed)
Subjective:    Patient ID: Mike Jimenez, male    DOB: [birth year], 64 y.o.   MRN: [id number]  DOS:  11/28/2015 Type of visit - description : CPX Interval history: Last seen about a year ago, today we also managed his multiple medical problems.   Review of Systems  Constitutional: No fever. No chills. No unexplained wt changes. No unusual sweats  HEENT: No dental problems, no ear discharge, no facial swelling, no voice changes. No eye discharge, no eye  redness , no  intolerance to light   Respiratory: No wheezing , no  difficulty breathing. No cough , no mucus production  Cardiovascular: No CP, no leg swelling , no  Palpitations  GI: no nausea, no vomiting, no diarrhea , no  abdominal pain.  No blood in the stools. No dysphagia, no odynophagia    Endocrine: No polyphagia, no polyuria , no polydipsia  GU: No dysuria, gross hematuria, difficulty urinating. No urinary urgency, no frequency.  Musculoskeletal: No joint swellings, occasional pain at the dorsum of the right foot when standing for long time.  Skin: No change in the color of the skin, palor , no  Rash  Allergic, immunologic: No environmental allergies , no  food allergies  Neurological: No dizziness no  syncope. No headaches. No diplopia, no slurred, no slurred speech, no motor deficits, no facial  Numbness  Hematological: No enlarged lymph nodes, no easy bruising , no unusual bleedings  Psychiatry: No suicidal ideas, no hallucinations, no beavior problems, no confusion.  No unusual/severe anxiety, no depression  Past Medical History  Diagnosis Date  . Hyperlipidemia   . Hypertension   . Anemia   . NICM (nonischemic cardiomyopathy) ([place])     a. Dx 2011 - presented with sustained VT. Normal coronaries at that time, EF 10-15% by cath and 20-25% by echo. b. s/p biventricular ICD 2011. c. CP 08/2014: mild nonobstructive CAD.  Marland Kitchen Gastric ulcer 2011  . Ventricular tachycardia ([place])     a. Sustained VT 2011 s/p MDT  [id number] Concert BiV ICD, ser# [id number] H.  . Postsurgical hypothyroidism   . Obesity   . Torn meniscus   . LBBB (left bundle branch block)   . Automatic implantable cardioverter-defibrillator in situ   . CHF (congestive heart failure) ([place])   . Type II diabetes mellitus ([place])   . Childhood asthma   . GERD (gastroesophageal reflux disease)   . Arthritis     "knees" (09/05/2014)  . History of gout   . Thyroid cancer ([place])     a. 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 Dr Harlow Asa. s/p thyroidectomy 2013.  Marland Kitchen CAD (coronary artery disease)     a. Mild nonobstructive CAD by cath 08/2014.    Past Surgical History  Procedure Laterality Date  . Ptx      traumatic, age 21 months  . Vasectomy    . Bi-ventricular implantable cardioverter defibrillator  (crt-d)  03/2010  . Thyroidectomy  12/10/2011    Procedure: TOTAL THYROIDECTOMY WITH CENTRAL COMPARTMENT DISSECTION;  Surgeon: Earnstine Regal, MD;  Location: [place];  Service: General;  Laterality: N/A;  Total thyroidectomy with limited central compartment lymph node dissection.  . Cardiac catheterization  03/2010  . Left heart catheterization with coronary angiogram N/A 09/06/2014    Procedure: LEFT HEART CATHETERIZATION WITH CORONARY ANGIOGRAM;  Surgeon: Burnell Blanks, MD;  Location: [facility] CATH LAB;  Service: Cardiovascular;  Laterality: N/A;    Social History   Social History  . Marital  Status: Married    Spouse Name: N/A  . Number of Children: 2  . Years of Education: N/A   Occupational History  . Warehouse work    Social History Main Topics  . Smoking status: Former Smoker -- 2.50 packs/day for 2 years    Types: Cigarettes    Quit date: 11/29/1970  . Smokeless tobacco: Former Systems developer    Types: Chew  . Alcohol Use: Yes     Comment: 09/05/2014 "2 drinks q couple months, if that  . Drug Use: No  . Sexual Activity: Yes   Other Topics Concern  . Not on file   Social History Narrative   Live w/ wife , daughter lives  w/ them - in [place]   7 dogs, fish pond.   Unloads trucks for a living.   Does not routinely exercise.     Family History  Problem Relation Age of Onset  . Diabetes Maternal Grandfather   . Diabetes Maternal Aunt   . Colon cancer Maternal Aunt   . Colon cancer Maternal Aunt   . Lung cancer Paternal Uncle   . Lung cancer Maternal Uncle   . Asthma Sister   . Atrial fibrillation Father   . Prostate cancer Neg Hx   . CAD Neg Hx   . Hypothyroidism Mother   . Hypothyroidism Daughter   . Hypertension Mother     alive @ 72.  Marland Kitchen Hypertension Father     alive @ 38.      Medication List       This list is accurate as of: 11/28/15 11:59 PM.  Always use your most recent med list.               allopurinol 100 MG tablet  Commonly known as:  ZYLOPRIM  Take 1 tablet (100 mg total) by mouth daily.     aspirin EC 81 MG tablet  Take 1 tablet (81 mg total) by mouth daily.     carvedilol 6.25 MG tablet  Commonly known as:  COREG  Take 1 tablet (6.25 mg total) by mouth 2 (two) times daily with a meal.     Fish Oil 1000 MG Caps  Take 1 capsule by mouth daily.     furosemide 40 MG tablet  Commonly known as:  LASIX  TAKE 1/2 TABLET (20 MG) BY MOUTH ONCE DAILY     levothyroxine 112 MCG tablet  Commonly known as:  SYNTHROID, LEVOTHROID  Take 224 mcg by mouth daily.     losartan 25 MG tablet  Commonly known as:  COZAAR  Take 1 tablet (25 mg total) by mouth daily.     metFORMIN 1000 MG tablet  Commonly known as:  GLUCOPHAGE  Take 1 tablet (1,000 mg total) by mouth 2 (two) times daily with a meal.     multivitamin tablet  Take 1 tablet by mouth daily.     POTASSIUM CHLORIDE PO  (OTC) Take 1 capsule once a day     tadalafil 20 MG tablet  Commonly known as:  CIALIS  Take 20 mg by mouth daily as needed for erectile dysfunction.     vitamin E 400 UNIT capsule  Take 400 Units by mouth daily.           Objective:   Physical Exam BP 122/74 mmHg  Pulse 61  Temp(Src) 98.2  F (36.8 C) (Oral)  Ht 6\' 2"  (1.88 m)  Wt 256 lb 4 oz (116.234 kg)  BMI 32.89 kg/m2  SpO2 98% General:   Well developed, well nourished . NAD.  Neck:  Full range of motion. Supple. No  thyromegaly, no mass HEENT:  Normocephalic . Face symmetric, atraumatic Lungs:  CTA B Normal respiratory effort, no intercostal retractions, no accessory muscle use. Heart: RRR,  no murmur.  No pretibial edema bilaterally  Abdomen:  Not distended, soft, non-tender. No rebound or rigidity.  Diabetic feet exam: Normal pedal pulses, no edema, pinprick examination normal, has high arch and some toe deformities. Skin: Exposed areas without rash. Not pale. Not jaundice Neurologic:  alert & oriented X3.  Speech normal, gait appropriate for age and unassisted Strength symmetric and appropriate for age.  Psych: Cognition and judgment appear intact.  Cooperative with normal attention span and concentration.  Behavior appropriate. No anxious or depressed appearing.    Assessment & Plan:   Assessment DM HTN Hyperlipidemia Hypothyroidism h/oThyroid cancer, nodularity, no metastasis, thyroidectomy 2013 CV: --CHF/nonischemic cardiomyopathy dx 2011 --Ventricular tachycardia, 2011, normal coronaries, EF  ~15 -20 % -- s/p  biventricular ICD 2011 --CAD -Nonobstructive   by cath 08-2014 --LBBB  GI: --GERD --H/o anemia:  --Gastric ulcer per EGD 07-2010 --Colonoscopy 2011:4 polyps  H/o gout  PLAN DM: Good compliance of medication, saw endocrinology 01-2015 but no follow-up since then. Check A1c. Exam negative today HTN: Seems well-controlled, check a CMP and CBC Hyperlipidemia: out of lipitor for months d/t insurance issues, check FLP and consider restart Lipitor. History of thyroid cancer: Refer to Dr.Kerr, check a TSH Cardiovascular: Seems to be stable History of gout: On allopurinol,  no recent problems. Compliance: Have not seen in almost a year, strongly recommend to come back every 3-4 months  for a checkup since he takes multiple medications and needs to be monitored. Apparently has some problems with insurance. RTC 4 months  Today, I spent more than 15  min with the patient (in addition to his cpx): >50% of the time counseling regards his chronic medical conditions

## 2015-11-28 NOTE — Assessment & Plan Note (Addendum)
Td 2015; Pneumovax 2011; Prevnar 2015; zostavax discussed , elected to have it today. Had a Flu shot CCS: Colonoscopy 9- 2011 4 polyps, due for a colonoscopy, discussed referral:elected referral, will do  Prostate cancer screening: DRE wnl, PSA 1.1 01-2014 Diet and exercise discussed

## 2015-11-29 LAB — HIV ANTIBODY (ROUTINE TESTING W REFLEX): HIV: NONREACTIVE

## 2015-11-30 ENCOUNTER — Other Ambulatory Visit: Payer: Self-pay | Admitting: Internal Medicine

## 2015-12-05 MED ORDER — LEVOTHYROXINE SODIUM 100 MCG PO TABS: 100.0000 ug | ORAL_TABLET | Freq: Every day | ORAL | Status: DC

## 2015-12-05 MED ORDER — ATORVASTATIN CALCIUM 20 MG PO TABS: 20.0000 mg | ORAL_TABLET | Freq: Every day | ORAL | Status: DC

## 2015-12-05 NOTE — Addendum Note (Signed)
Addended by: Damita Dunnings D on: 12/05/2015 10:17 AM   Modules accepted: Orders, Medications

## 2015-12-08 ENCOUNTER — Ambulatory Visit (INDEPENDENT_AMBULATORY_CARE_PROVIDER_SITE_OTHER): Payer: 59

## 2015-12-08 DIAGNOSIS — I5022 Chronic systolic (congestive) heart failure: Secondary | ICD-10-CM | POA: Diagnosis not present

## 2015-12-08 DIAGNOSIS — Z95810 Presence of automatic (implantable) cardiac defibrillator: Secondary | ICD-10-CM

## 2015-12-09 NOTE — Progress Notes (Signed)
EPIC Encounter for ICM Monitoring  Patient Name: Mike Jimenez is a 65 y.o. male Date: 12/09/2015 Primary Care Physican: Kathlene November, MD Primary Cardiologist: Lovena Le Electrophysiologist: Lovena Le Dry Weight: 254 lb  Bi-V Pacing 87.1%        In the past month, have you:  1. Gained more than 2 pounds in a day or more than 5 pounds in a week? no  2. Had changes in your medications (with verification of current medications)? no  3. Had more shortness of breath than is usual for you? no  4. Limited your activity because of shortness of breath? no  5. Not been able to sleep because of shortness of breath? no  6. Had increased swelling in your feet or ankles? no  7. Had symptoms of dehydration (dizziness, dry mouth, increased thirst, decreased urine output) no  8. Had changes in sodium restriction? no  9. Been compliant with medication? Yes   ICM trend: 1 year view 12/08/2015   ICM trend: 3 month view 12/08/2015    Follow-up plan: ICM clinic phone appointment 01/13/2016.  Optivol impedance below baseline in December and returned back to baseline on 12/03/2015.  He denied any symptoms during that time but did eat foods that were high in sodium during that time.  He stated he is doing well at this time.  No changes today.    Copy of note sent to patient's primary care physician, primary cardiologist, and device following physician.  Rosalene Billings, RN, CCM 12/09/2015 3:56 PM

## 2015-12-27 ENCOUNTER — Other Ambulatory Visit: Payer: Self-pay | Admitting: Internal Medicine

## 2016-01-13 ENCOUNTER — Ambulatory Visit (INDEPENDENT_AMBULATORY_CARE_PROVIDER_SITE_OTHER): Payer: 59

## 2016-01-13 DIAGNOSIS — Z95810 Presence of automatic (implantable) cardiac defibrillator: Secondary | ICD-10-CM

## 2016-01-13 DIAGNOSIS — I5022 Chronic systolic (congestive) heart failure: Secondary | ICD-10-CM | POA: Diagnosis not present

## 2016-01-15 NOTE — Progress Notes (Signed)
EPIC Encounter for ICM Monitoring  Patient Name: Mike Jimenez is a 65 y.o. male Date: 01/15/2016 Primary Care Physican: Kathlene November, MD Primary Cardiologist: Lovena Le Electrophysiologist: Lovena Le Dry Weight: 245 lbs  Bi-V Pacing 85.1%       In the past month, have you:  1. Gained more than 2 pounds in a day or more than 5 pounds in a week? no  2. Had changes in your medications (with verification of current medications)? no  3. Had more shortness of breath than is usual for you? no  4. Limited your activity because of shortness of breath? no  5. Not been able to sleep because of shortness of breath? no  6. Had increased swelling in your feet or ankles? no  7. Had symptoms of dehydration (dizziness, dry mouth, increased thirst, decreased urine output) no  8. Had changes in sodium restriction? no  9. Been compliant with medication? Yes   ICM trend: 3 month view    ICM trend: 1 year view   Follow-up plan: ICM clinic phone appointment 02/18/2016.  Optivol thoracic impedance at reference line starting 01/08/2016.   Thoracic impedance below reference line from 12/17/2015 to 12/27/2015 and 01/05/2016 to 2/92017 suggesting fluid accumulation but denied any fluid symptoms.  He stated he is has been eating differently due to moving parents into a nursing home in the area.   Encouraged him to call for any fluid symptoms.  No changes today.   Copy of note sent to patient's primary care physician, primary cardiologist, and device following physician.  Rosalene Billings, RN, CCM 01/15/2016 12:47 PM

## 2016-01-29 ENCOUNTER — Other Ambulatory Visit: Payer: Self-pay | Admitting: Internal Medicine

## 2016-02-03 LAB — HM DIABETES FOOT EXAM

## 2016-02-03 LAB — TSH: TSH: 31.32 u[IU]/mL — AB (ref <=5.90)

## 2016-02-06 ENCOUNTER — Encounter: Payer: Self-pay | Admitting: Internal Medicine

## 2016-02-08 ENCOUNTER — Telehealth: Payer: Self-pay | Admitting: Internal Medicine

## 2016-02-09 NOTE — Telephone Encounter (Signed)
LMOM informing Pt to return call regarding TSH results and Synthroid dosage.

## 2016-02-09 NOTE — Telephone Encounter (Signed)
Pt requesting refill on Synthroid, TSH completed on 02/03/2016. Please advise.

## 2016-02-09 NOTE — Telephone Encounter (Signed)
TSH 31!, please call patient and ask about compliance and Synthroid dosing.let me know

## 2016-02-11 MED ORDER — LEVOTHYROXINE SODIUM 100 MCG PO TABS: 100.0000 ug | ORAL_TABLET | Freq: Every day | ORAL | Status: DC

## 2016-02-11 NOTE — Telephone Encounter (Signed)
Please call the patient again, if you are unable to communicate with him, send him a letter: --Needs follow-up --Enclose a prescription for Synthroid 100 mcg #30 no refills.

## 2016-02-11 NOTE — Telephone Encounter (Signed)
LMOM informing Pt to return call regarding lab results.  

## 2016-02-11 NOTE — Telephone Encounter (Signed)
Letter printed w/ Synthroid Rx and mailed to Pt w/ recommendations below.

## 2016-02-11 NOTE — Addendum Note (Signed)
Addended byDamita Dunnings D on: 02/11/2016 02:04 PM   Modules accepted: Orders, Medications

## 2016-02-17 ENCOUNTER — Encounter: Payer: Self-pay | Admitting: Internal Medicine

## 2016-02-18 ENCOUNTER — Telehealth: Payer: Self-pay

## 2016-02-18 ENCOUNTER — Ambulatory Visit (INDEPENDENT_AMBULATORY_CARE_PROVIDER_SITE_OTHER): Payer: 59 | Admitting: *Deleted

## 2016-02-18 DIAGNOSIS — I5022 Chronic systolic (congestive) heart failure: Secondary | ICD-10-CM | POA: Diagnosis not present

## 2016-02-18 DIAGNOSIS — Z95810 Presence of automatic (implantable) cardiac defibrillator: Secondary | ICD-10-CM

## 2016-02-18 DIAGNOSIS — I472 Ventricular tachycardia, unspecified: Secondary | ICD-10-CM

## 2016-02-18 NOTE — Progress Notes (Signed)
Remote ICD transmission.   

## 2016-02-18 NOTE — Telephone Encounter (Signed)
ICM transmission received.  Attempted call to patient and left voice mail for return call.

## 2016-02-18 NOTE — Progress Notes (Signed)
EPIC Encounter for ICM Monitoring  Patient Name: Mike Jimenez is a 65 y.o. male Date: 02/18/2016 Primary Care Physican: Kathlene November, MD Primary Cardiologist: Lovena Le Electrophysiologist: Lovena Le Dry Weight: unknown   Bi-V Pacing 87.8%      In the past month, have you:  1. Gained more than 2 pounds in a day or more than 5 pounds in a week? N/A  2. Had changes in your medications (with verification of current medications)? N/A  3. Had more shortness of breath than is usual for you? N/A  4. Limited your activity because of shortness of breath? N/A  5. Not been able to sleep because of shortness of breath? N/A  6. Had increased swelling in your feet or ankles? N/A  7. Had symptoms of dehydration (dizziness, dry mouth, increased thirst, decreased urine output) N/A  8. Had changes in sodium restriction? N/A  9. Been compliant with medication? N/A   ICM trend: 3 month view for 02/18/2016   ICM trend: 1 year view for 02/18/2016   Follow-up plan: ICM clinic phone appointment on  03/24/2016.  Attempted call to patient and unable to reach.  Transmission reviewed.  Thoracic impedance trending close to reference line suggesting stable fluid levels.       Rosalene Billings, RN, CCM 02/18/2016 12:53 PM

## 2016-03-02 ENCOUNTER — Encounter: Payer: Self-pay | Admitting: Internal Medicine

## 2016-03-07 ENCOUNTER — Observation Stay (HOSPITAL_COMMUNITY)
Admission: EM | Admit: 2016-03-07 | Discharge: 2016-03-09 | Disposition: A | Discharge status: Home / Self Care | Payer: 59 | Attending: Emergency Medicine | Admitting: Emergency Medicine

## 2016-03-07 ENCOUNTER — Ambulatory Visit (INDEPENDENT_AMBULATORY_CARE_PROVIDER_SITE_OTHER): Payer: 59

## 2016-03-07 ENCOUNTER — Ambulatory Visit (INDEPENDENT_AMBULATORY_CARE_PROVIDER_SITE_OTHER): Payer: 59 | Admitting: Physician Assistant

## 2016-03-07 ENCOUNTER — Encounter (HOSPITAL_COMMUNITY): Payer: Self-pay | Admitting: Family Medicine

## 2016-03-07 ENCOUNTER — Emergency Department (HOSPITAL_COMMUNITY): Payer: 59

## 2016-03-07 VITALS — BP 142/80 | HR 79 | Temp 97.9°F | Resp 14 | Ht 74.5 in | Wt 267.0 lb

## 2016-03-07 DIAGNOSIS — Z85850 Personal history of malignant neoplasm of thyroid: Secondary | ICD-10-CM | POA: Diagnosis not present

## 2016-03-07 DIAGNOSIS — E119 Type 2 diabetes mellitus without complications: Secondary | ICD-10-CM | POA: Insufficient documentation

## 2016-03-07 DIAGNOSIS — R0602 Shortness of breath: Secondary | ICD-10-CM

## 2016-03-07 DIAGNOSIS — I2510 Atherosclerotic heart disease of native coronary artery without angina pectoris: Secondary | ICD-10-CM | POA: Diagnosis not present

## 2016-03-07 DIAGNOSIS — K259 Gastric ulcer, unspecified as acute or chronic, without hemorrhage or perforation: Secondary | ICD-10-CM | POA: Diagnosis not present

## 2016-03-07 DIAGNOSIS — R079 Chest pain, unspecified: Secondary | ICD-10-CM | POA: Diagnosis present

## 2016-03-07 DIAGNOSIS — I10 Essential (primary) hypertension: Secondary | ICD-10-CM | POA: Diagnosis not present

## 2016-03-07 DIAGNOSIS — I5023 Acute on chronic systolic (congestive) heart failure: Secondary | ICD-10-CM

## 2016-03-07 DIAGNOSIS — K219 Gastro-esophageal reflux disease without esophagitis: Secondary | ICD-10-CM | POA: Diagnosis not present

## 2016-03-07 DIAGNOSIS — D649 Anemia, unspecified: Secondary | ICD-10-CM | POA: Insufficient documentation

## 2016-03-07 DIAGNOSIS — I509 Heart failure, unspecified: Secondary | ICD-10-CM | POA: Diagnosis not present

## 2016-03-07 DIAGNOSIS — E890 Postprocedural hypothyroidism: Secondary | ICD-10-CM | POA: Insufficient documentation

## 2016-03-07 DIAGNOSIS — E669 Obesity, unspecified: Secondary | ICD-10-CM | POA: Insufficient documentation

## 2016-03-07 DIAGNOSIS — J45901 Unspecified asthma with (acute) exacerbation: Principal | ICD-10-CM | POA: Insufficient documentation

## 2016-03-07 DIAGNOSIS — E785 Hyperlipidemia, unspecified: Secondary | ICD-10-CM | POA: Diagnosis present

## 2016-03-07 DIAGNOSIS — M1990 Unspecified osteoarthritis, unspecified site: Secondary | ICD-10-CM | POA: Insufficient documentation

## 2016-03-07 DIAGNOSIS — Z79899 Other long term (current) drug therapy: Secondary | ICD-10-CM | POA: Insufficient documentation

## 2016-03-07 DIAGNOSIS — E1165 Type 2 diabetes mellitus with hyperglycemia: Secondary | ICD-10-CM

## 2016-03-07 DIAGNOSIS — I429 Cardiomyopathy, unspecified: Secondary | ICD-10-CM | POA: Insufficient documentation

## 2016-03-07 DIAGNOSIS — Z7982 Long term (current) use of aspirin: Secondary | ICD-10-CM | POA: Insufficient documentation

## 2016-03-07 DIAGNOSIS — I472 Ventricular tachycardia: Secondary | ICD-10-CM | POA: Insufficient documentation

## 2016-03-07 DIAGNOSIS — Z7984 Long term (current) use of oral hypoglycemic drugs: Secondary | ICD-10-CM | POA: Diagnosis not present

## 2016-03-07 DIAGNOSIS — Z87891 Personal history of nicotine dependence: Secondary | ICD-10-CM | POA: Insufficient documentation

## 2016-03-07 LAB — TROPONIN I: TROPONIN I: 0.04 ng/mL — AB (ref <0.031)

## 2016-03-07 LAB — BASIC METABOLIC PANEL
ANION GAP: 13 (ref 5–15)
BUN: 11 mg/dL (ref 6–20)
CO2: 24 mmol/L (ref 22–32)
CREATININE: 1.02 mg/dL (ref 0.61–1.24)
Calcium: 8.9 mg/dL (ref 8.9–10.3)
Chloride: 104 mmol/L (ref 101–111)
GFR calc Af Amer: >60 mL/min (ref >60)
Glucose, Bld: 150 mg/dL — ABNORMAL HIGH (ref 65–99)
POTASSIUM: 3.9 mmol/L (ref 3.5–5.1)
Sodium: 141 mmol/L (ref 135–145)

## 2016-03-07 LAB — POCT CBC
GRANULOCYTE PERCENT: 79.5 % (ref 37–80)
HCT, POC: 35.4 % — AB (ref 43.5–53.7)
HEMOGLOBIN: 12.3 g/dL — AB (ref 14.1–18.1)
LYMPH, POC: 0.8 (ref 0.6–3.4)
MCH, POC: 31.6 pg — AB (ref 27–31.2)
MCHC: 34.8 g/dL (ref 31.8–35.4)
MCV: 90.8 fL (ref 80–97)
MID (cbc): 0.7 (ref 0–0.9)
MPV: 6.8 fL (ref 0–99.8)
PLATELET COUNT, POC: 205 10*3/uL (ref 142–424)
POC GRANULOCYTE: 6 (ref 2–6.9)
POC LYMPH %: 10.7 % (ref 10–50)
POC MID %: 9.8 %M (ref 0–12)
RBC: 3.9 M/uL — AB (ref 4.69–6.13)
RDW, POC: 13.9 %
WBC: 7.6 10*3/uL (ref 4.6–10.2)

## 2016-03-07 LAB — CBC
HCT: 37.7 % — ABNORMAL LOW (ref 39.0–52.0)
Hemoglobin: 12.7 g/dL — ABNORMAL LOW (ref 13.0–17.0)
MCH: 31.2 pg (ref 26.0–34.0)
MCHC: 33.7 g/dL (ref 30.0–36.0)
MCV: 92.6 fL (ref 78.0–100.0)
PLATELETS: 204 10*3/uL (ref 150–400)
RBC: 4.07 MIL/uL — AB (ref 4.22–5.81)
RDW: 13.5 % (ref 11.5–15.5)
WBC: 7.4 10*3/uL (ref 4.0–10.5)

## 2016-03-07 LAB — COMPLETE METABOLIC PANEL WITH GFR
ALBUMIN: 4.1 g/dL (ref 3.6–5.1)
ALK PHOS: 62 U/L (ref 40–115)
ALT: 12 U/L (ref 9–46)
AST: 14 U/L (ref 10–35)
BUN: 12 mg/dL (ref 7–25)
CO2: 29 mmol/L (ref 20–31)
Calcium: 8.6 mg/dL (ref 8.6–10.3)
Chloride: 102 mmol/L (ref 98–110)
Creat: 0.98 mg/dL (ref 0.70–1.25)
GFR, EST NON AFRICAN AMERICAN: 81 mL/min (ref >=60)
GFR, Est African American: >89 mL/min (ref >=60)
GLUCOSE: 153 mg/dL — AB (ref 65–99)
POTASSIUM: 4.2 mmol/L (ref 3.5–5.3)
SODIUM: 140 mmol/L (ref 135–146)
TOTAL PROTEIN: 6.8 g/dL (ref 6.1–8.1)
Total Bilirubin: 1.5 mg/dL — ABNORMAL HIGH (ref 0.2–1.2)

## 2016-03-07 LAB — I-STAT TROPONIN, ED: Troponin i, poc: 0.02 ng/mL (ref 0.00–0.08)

## 2016-03-07 LAB — BRAIN NATRIURETIC PEPTIDE: Brain Natriuretic Peptide: 1373.7 pg/mL — ABNORMAL HIGH (ref <100)

## 2016-03-07 MED ORDER — ISOSORB DINITRATE-HYDRALAZINE 20-37.5 MG PO TABS
0.5000 | ORAL_TABLET | Freq: Two times a day (BID) | ORAL | Status: DC
  Administered 2016-03-07 – 2016-03-09 (×4): 0.5 via ORAL
  Filled 2016-03-07 (×4): qty 1

## 2016-03-07 MED ORDER — INSULIN ASPART 100 UNIT/ML ~~LOC~~ SOLN
0.0000 [IU] | SUBCUTANEOUS | Status: DC
  Administered 2016-03-08 (×2): 2 [IU] via SUBCUTANEOUS
  Administered 2016-03-08: 4 [IU] via SUBCUTANEOUS
  Administered 2016-03-09: 2 [IU] via SUBCUTANEOUS

## 2016-03-07 MED ORDER — LEVOTHYROXINE SODIUM 100 MCG PO TABS
200.0000 ug | ORAL_TABLET | Freq: Every day | ORAL | Status: DC
  Administered 2016-03-07 – 2016-03-09 (×3): 200 ug via ORAL
  Filled 2016-03-07 (×3): qty 2

## 2016-03-07 MED ORDER — IPRATROPIUM-ALBUTEROL 0.5-2.5 (3) MG/3ML IN SOLN: 3.0000 mL | RESPIRATORY_TRACT | Status: DC | PRN

## 2016-03-07 MED ORDER — CARVEDILOL 6.25 MG PO TABS
6.2500 mg | ORAL_TABLET | Freq: Two times a day (BID) | ORAL | Status: DC
  Administered 2016-03-07 – 2016-03-09 (×4): 6.25 mg via ORAL
  Filled 2016-03-07 (×4): qty 1

## 2016-03-07 MED ORDER — ACETAMINOPHEN 325 MG PO TABS
650.0000 mg | ORAL_TABLET | Freq: Four times a day (QID) | ORAL | Status: DC | PRN
  Administered 2016-03-07: 650 mg via ORAL
  Filled 2016-03-07: qty 2

## 2016-03-07 MED ORDER — ATORVASTATIN CALCIUM 20 MG PO TABS
20.0000 mg | ORAL_TABLET | Freq: Every day | ORAL | Status: DC
  Administered 2016-03-07 – 2016-03-09 (×3): 20 mg via ORAL
  Filled 2016-03-07 (×3): qty 1

## 2016-03-07 MED ORDER — FUROSEMIDE 20 MG PO TABS
20.0000 mg | ORAL_TABLET | Freq: Every day | ORAL | Status: DC
  Administered 2016-03-07: 20 mg via ORAL

## 2016-03-07 MED ORDER — ASPIRIN EC 81 MG PO TBEC
81.0000 mg | DELAYED_RELEASE_TABLET | Freq: Every day | ORAL | Status: DC
  Administered 2016-03-07 – 2016-03-08 (×2): 81 mg via ORAL
  Filled 2016-03-07: qty 1

## 2016-03-07 MED ORDER — ALLOPURINOL 100 MG PO TABS
100.0000 mg | ORAL_TABLET | Freq: Every day | ORAL | Status: DC
  Administered 2016-03-07 – 2016-03-09 (×3): 100 mg via ORAL
  Filled 2016-03-07 (×3): qty 1

## 2016-03-07 MED ORDER — ACETAMINOPHEN 325 MG PO TABS
650.0000 mg | ORAL_TABLET | Freq: Four times a day (QID) | ORAL | Status: DC | PRN
  Administered 2016-03-07 – 2016-03-09 (×2): 650 mg via ORAL
  Filled 2016-03-07: qty 2

## 2016-03-07 MED ORDER — ENOXAPARIN SODIUM 40 MG/0.4ML ~~LOC~~ SOLN
40.0000 mg | SUBCUTANEOUS | Status: DC
  Administered 2016-03-07 – 2016-03-08 (×2): 40 mg via SUBCUTANEOUS
  Filled 2016-03-07 (×2): qty 0.4

## 2016-03-07 NOTE — ED Notes (Signed)
Cardiology at bedside.

## 2016-03-07 NOTE — ED Notes (Signed)
2nd attempt to call report.  

## 2016-03-07 NOTE — Patient Instructions (Signed)
     IF you received an x-ray today, you will receive an invoice from [facility]. Please contact [facility] at [phone] with questions or concerns regarding your invoice.   IF you received labwork today, you will receive an invoice from [facility]. Please contact [facility] at [phone] with questions or concerns regarding your invoice.   Our billing staff will not be able to assist you with questions regarding bills from these companies.  You will be contacted with the lab results as soon as they are available. The fastest way to get your results is to activate your My Chart account. Instructions are located on the last page of this paperwork. If you have not heard from us regarding the results in 2 weeks, please contact this office.      

## 2016-03-07 NOTE — ED Notes (Signed)
Attempted report 

## 2016-03-07 NOTE — ED Notes (Signed)
Pt. Transported to xray at this time.  

## 2016-03-07 NOTE — Progress Notes (Signed)
03/07/2016 3:51 PM   DOB: 1950/12/31 / MRN: Mike Jimenez  SUBJECTIVE:  Mike Jimenez is a 65 y.o. male presenting for SOB that started 4 days ago and is worsening.  He has a history of CHF and cardiomyopathy.  His last echo was 10/9 and showed an LVEF of 20-25% with severe dilation. This lead to an pacemake/defibrillator implantation.  His daughter is with him today and reports he is having increase DOE, and is also having to take breaks during stair climbing.  He sleeps on two pillows at night and this has not increased.  He denies dizziness, chest pain, diaphoresis, and cough.  He associates some nasal congestion however denies fever, chills, nausea.    He is allergic to ramipril.   He  has a past medical history of Hyperlipidemia; Hypertension; Anemia; NICM (nonischemic cardiomyopathy) (Mike Jimenez); Gastric ulcer (2011); Ventricular tachycardia (Mike Jimenez); Postsurgical hypothyroidism; Obesity; Torn meniscus; LBBB (left bundle branch block); Automatic implantable cardioverter-defibrillator in situ; CHF (congestive heart failure) (Mike Jimenez); Type II diabetes mellitus (Mike Jimenez); Childhood asthma; GERD (gastroesophageal reflux disease); Arthritis; History of gout; Thyroid cancer (Mike Jimenez); and CAD (coronary artery disease).    He  reports that he quit smoking about 45 years ago. His smoking use included Cigarettes. He has a 5 pack-year smoking history. He has quit using smokeless tobacco. His smokeless tobacco use included Chew. He reports that he drinks alcohol. He reports that he does not use illicit drugs. He  reports that he currently engages in sexual activity. The patient  has past surgical history that includes PTX; Vasectomy; Bi-ventricular implantable cardioverter defibrillator  (crt-d) (03/2010); Thyroidectomy (12/10/2011); Cardiac catheterization (03/2010); and left heart catheterization with coronary angiogram (N/A, 09/06/2014).  His family history includes Asthma in his sister; Atrial fibrillation in his father; Colon cancer  in his maternal aunt and maternal aunt; Diabetes in his maternal aunt and maternal grandfather; Hypertension in his father and mother; Hypothyroidism in his daughter and mother; Lung cancer in his maternal uncle and paternal uncle. There is no history of Prostate cancer or CAD.  Review of Systems  Constitutional: Negative for fever, chills, weight loss and diaphoresis.  Respiratory: Positive for shortness of breath. Negative for cough, hemoptysis, sputum production and wheezing.   Cardiovascular: Negative for chest pain, orthopnea and leg swelling.  Skin: Negative for rash.  Neurological: Positive for weakness. Negative for dizziness and headaches.    Problem list and medications reviewed and updated by myself where necessary, and exist elsewhere in the encounter.   OBJECTIVE:  BP 142/80 mmHg  Pulse 79  Temp(Src) 97.9 F (36.6 C) (Oral)  Resp 14  Ht 6' 2.5" (1.892 m)  Wt 267 lb (121.11 kg)  BMI 33.83 kg/m2  SpO2 97%  Vitals - 1 value per visit 03/07/2016 11/28/2015 11/07/2015  Weight (lb) 267 256.25 259.8    Physical Exam  Constitutional: He is oriented to person, place, and time. He appears well-developed. He does not appear ill.  HENT:  Right Ear: Tympanic membrane normal.  Left Ear: Tympanic membrane normal.  Nose: Nose normal.  Mouth/Throat: Uvula is midline, oropharynx is clear and moist and mucous membranes are normal.  Eyes: Conjunctivae and EOM are normal. Pupils are equal, round, and reactive to light.  Cardiovascular: Normal rate and regular rhythm.   Pulmonary/Chest: Effort normal and breath sounds normal.    Abdominal: He exhibits no distension.  Musculoskeletal: Normal range of motion.  Neurological: He is alert and oriented to person, place, and time. No cranial nerve deficit. Coordination normal.  Skin: Skin is warm and dry. He is not diaphoretic.  Psychiatric: He has a normal mood and affect.  Nursing note and vitals reviewed.   Results for orders placed  or performed in visit on 03/07/16 (from the past 72 hour(s))  POCT CBC     Status: Abnormal   Collection Time: 03/07/16  1:55 PM  Result Value Ref Range   WBC 7.6 4.6 - 10.2 K/uL   Lymph, poc 0.8 0.6 - 3.4   POC LYMPH PERCENT 10.7 10 - 50 %L   MID (cbc) 0.7 0 - 0.9   POC MID % 9.8 0 - 12 %M   POC Granulocyte 6.0 2 - 6.9   Granulocyte percent 79.5 37 - 80 %G   RBC 3.90 (A) 4.69 - 6.13 M/uL   Hemoglobin 12.3 (A) 14.1 - 18.1 g/dL   HCT, POC 35.4 (A) 43.5 - 53.7 %   MCV 90.8 80 - 97 fL   MCH, POC 31.6 (A) 27 - 31.2 pg   MCHC 34.8 31.8 - 35.4 g/dL   RDW, POC 13.9 %   Platelet Count, POC 205 142 - 424 K/uL   MPV 6.8 0 - 99.8 fL    Dg Chest 2 View  03/07/2016  CLINICAL DATA:  Shortness of breath and congestion EXAM: CHEST  2 VIEW COMPARISON:  03/07/2016 FINDINGS: Cardiac shadow is again mildly enlarged. A defibrillator is again noted. The lungs are well aerated bilaterally without focal infiltrate or sizable effusion. No acute bony abnormality is noted. IMPRESSION: No acute abnormality noted. There is no significant change from the film obtained 3 hours previous. Electronically Signed   By: Mike Jimenez M.D.   On: 03/07/2016 15:35   Dg Chest 2 View  03/07/2016  CLINICAL DATA:  Shortness of breath starting 4 days ago. History CHF. EXAM: CHEST  2 VIEW COMPARISON:  09/05/2014 FINDINGS: Cardiac silhouette is mildly enlarged. Left anterior chest wall biventricular cardioverter-defibrillator is stable and well positioned. No mediastinal or hilar masses or evidence of adenopathy. The lungs are clear. No evidence of pulmonary edema. No pleural effusion or pneumothorax. Bony thorax is intact. IMPRESSION: No acute cardiopulmonary disease. Electronically Signed   By: Mike Jimenez M.D.   On: 03/07/2016 14:08    ASSESSMENT AND PLAN  Mike Jimenez was seen today for nasal congestion and shortness of breath.  Diagnoses and all orders for this visit:  Shortness of breath: Most likely cardiac. He has an extensive  cardiac history with a very low EF.  He may be volume up but I have no objective date that supports this aside from an 11 lbs weight gain in the last four months. This may also be stable angina, and he is at very high risk of ASCVD.  Advised that he would be best off going to the ED.  -     EKG 12-Lead -     DG Chest 2 View; Future -     POCT CBC -     COMPLETE METABOLIC PANEL WITH GFR -     Brain natriuretic peptide    The patient was advised to call or return to clinic if he does not see an improvement in symptoms or to seek the care of the closest emergency department if he worsens with the above plan.   Philis Fendt, MHS, PA-C [facility] 03/07/2016 3:51 PM

## 2016-03-07 NOTE — ED Notes (Signed)
Pt here for SOB over the past week. Denies cough. sts sinus headache. St tightness in chest.

## 2016-03-07 NOTE — ED Provider Notes (Signed)
CSN: [id number]     Arrival date & time 03/07/16  1447 History   First MD Initiated Contact with Patient 03/07/16 1524     Chief Complaint  Patient presents with  . Shortness of Breath     (Consider location/radiation/quality/duration/timing/severity/associated sxs/prior Treatment) Patient is a 65 y.o. male presenting with shortness of breath. The history is provided by the patient. No language interpreter was used.  Shortness of Breath Severity:  Moderate Onset quality:  Gradual Duration:  2 days Timing:  Constant Progression:  Worsening Chronicity:  New Context: activity and pollens   Relieved by:  Nothing Worsened by:  Nothing tried Ineffective treatments:  None tried Associated symptoms: chest pain   Risk factors: obesity   Pt reports he has had shortness of breath for several days.  Pt reports he began having chest tightness 2 days ago.  Pt went to Urgent care today and was advised to come here.  Pt has a history of chf. His last EF was 18 months ago and was 20-25. He has a pacemaker and defibrillator.   Past Medical History  Diagnosis Date  . Hyperlipidemia   . Hypertension   . Anemia   . NICM (nonischemic cardiomyopathy) ([place])     a. Dx 2011 - presented with sustained VT. Normal coronaries at that time, EF 10-15% by cath and 20-25% by echo. b. s/p biventricular ICD 2011. c. CP 08/2014: mild nonobstructive CAD.  Marland Kitchen Gastric ulcer 2011  . Ventricular tachycardia ([place])     a. Sustained VT 2011 s/p MDT [id number] Concert BiV ICD, ser# [id number].  . Postsurgical hypothyroidism   . Obesity   . Torn meniscus   . LBBB (left bundle branch block)   . Automatic implantable cardioverter-defibrillator in situ   . CHF (congestive heart failure) ([place])   . Type II diabetes mellitus ([place])   . Childhood asthma   . GERD (gastroesophageal reflux disease)   . Arthritis     "knees" (09/05/2014)  . History of gout   . Thyroid cancer ([place])     a. Papillary thyroid carcinoma (3 foci) w/o  metastases. Dr Harlow Asa. s/p thyroidectomy 2013.  Marland Kitchen CAD (coronary artery disease)     a. Mild nonobstructive CAD by cath 08/2014.   Past Surgical History  Procedure Laterality Date  . Ptx      traumatic, age 23 months  . Vasectomy    . Bi-ventricular implantable cardioverter defibrillator  (crt-d)  03/2010  . Thyroidectomy  12/10/2011    Procedure: TOTAL THYROIDECTOMY WITH CENTRAL COMPARTMENT DISSECTION;  Surgeon: Earnstine Regal, MD;  Location: [facility];  Service: General;  Laterality: N/A;  Total thyroidectomy with limited central compartment lymph node dissection.  . Cardiac catheterization  03/2010  . Left heart catheterization with coronary angiogram N/A 09/06/2014    Procedure: LEFT HEART CATHETERIZATION WITH CORONARY ANGIOGRAM;  Surgeon: Burnell Blanks, MD;  Location: [facility] C[facility];  Service: Cardiovascular;  Laterality: N/A;   Family History  Problem Relation Age of Onset  . Diabetes Maternal Grandfather   . Diabetes Maternal Aunt   . Colon cancer Maternal Aunt   . Colon cancer Maternal Aunt   . Lung cancer Paternal Uncle   . Lung cancer Maternal Uncle   . Asthma Sister   . Atrial fibrillation Father   . Prostate cancer Neg Hx   . CAD Neg Hx   . Hypothyroidism Mother   . Hypothyroidism Daughter   . Hypertension Mother     alive @ 19.  Marland Kitchen  Hypertension Father     alive @ 18.   Social History  Substance Use Topics  . Smoking status: Former Smoker -- 2.50 packs/day for 2 years    Types: Cigarettes    Quit date: 11/29/1970  . Smokeless tobacco: Former Systems developer    Types: Chew  . Alcohol Use: Yes     Comment: 09/05/2014 "2 drinks q couple months, if that    Review of Systems  Respiratory: Positive for shortness of breath.   Cardiovascular: Positive for chest pain.  All other systems reviewed and are negative.     Allergies  Ramipril  Home Medications   Prior to Admission medications   Medication Sig Start Date End Date Taking? Authorizing Provider  allopurinol  (ZYLOPRIM) 100 MG tablet Take 1 tablet (100 mg total) by mouth daily. 12/02/15   Colon Branch, MD  aspirin EC 81 MG tablet Take 1 tablet (81 mg total) by mouth daily. 08/29/14   Dayna N Dunn, PA-C  atorvastatin (LIPITOR) 20 MG tablet Take 1 tablet (20 mg total) by mouth daily. 12/05/15   Colon Branch, MD  carvedilol (COREG) 6.25 MG tablet Take 1 tablet (6.25 mg total) by mouth 2 (two) times daily with a meal. 12/02/15   Evans Lance, MD  furosemide (LASIX) 40 MG tablet TAKE 1/2 TABLET (20 MG) BY MOUTH ONCE DAILY 01/29/16   Evans Lance, MD  levothyroxine (SYNTHROID, LEVOTHROID) 100 MCG tablet Take 1 tablet (100 mcg total) by mouth daily before breakfast. 02/11/16   Colon Branch, MD  losartan (COZAAR) 25 MG tablet TAKE 1 TABLET EVERY DAY 12/29/15   Evans Lance, MD  metFORMIN (GLUCOPHAGE) 1000 MG tablet Take 1 tablet (1,000 mg total) by mouth 2 (two) times daily with a meal. 02/13/15   Colon Branch, MD  Multiple Vitamin (MULTIVITAMIN) tablet Take 1 tablet by mouth daily.      Historical Provider, MD  Omega-3 Fatty Acids (FISH OIL) 1000 MG CAPS Take 1 capsule by mouth daily.    Historical Provider, MD  POTASSIUM CHLORIDE PO (OTC) Take 1 capsule once a day    Historical Provider, MD  tadalafil (CIALIS) 20 MG tablet Take 20 mg by mouth daily as needed for erectile dysfunction.    Historical Provider, MD  vitamin E 400 UNIT capsule Take 400 Units by mouth daily.    Historical Provider, MD   BP 156/101 mmHg  Pulse 92  Temp(Src) 99 F (37.2 C) (Oral)  Resp 20  Ht 6\' 2"  (1.88 m)  Wt 117.935 kg  BMI 33.37 kg/m2  SpO2 96% Physical Exam  Constitutional: He is oriented to person, place, and time. He appears well-developed and well-nourished.  HENT:  Head: Normocephalic.  Right Ear: External ear normal.  Left Ear: External ear normal.  Eyes: Conjunctivae are normal. Pupils are equal, round, and reactive to light.  Neck: Normal range of motion. Neck supple.  Cardiovascular: Normal rate and regular rhythm.    Pulmonary/Chest: Effort normal.  Abdominal: Soft.  Musculoskeletal: Normal range of motion.  Neurological: He is alert and oriented to person, place, and time. He has normal reflexes.  Skin: Skin is warm.  Psychiatric: He has a normal mood and affect.  Nursing note and vitals reviewed.   ED Course  Procedures (including critical care time) Labs Review Labs Reviewed  BASIC METABOLIC PANEL - Abnormal; Notable for the following:    Glucose, Bld 150 (*)    All other components within normal limits  CBC -  Abnormal; Notable for the following:    RBC 4.07 (*)    Hemoglobin 12.7 (*)    HCT 37.7 (*)    All other components within normal limits  I-STAT TROPOININ, ED    Imaging Review Dg Chest 2 View  03/07/2016  CLINICAL DATA:  Shortness of breath and congestion EXAM: CHEST  2 VIEW COMPARISON:  03/07/2016 FINDINGS: Cardiac shadow is again mildly enlarged. A defibrillator is again noted. The lungs are well aerated bilaterally without focal infiltrate or sizable effusion. No acute bony abnormality is noted. IMPRESSION: No acute abnormality noted. There is no significant change from the film obtained 3 hours previous. Electronically Signed   By: Inez Catalina M.D.   On: 03/07/2016 15:35   Dg Chest 2 View  03/07/2016  CLINICAL DATA:  Shortness of breath starting 4 days ago. History CHF. EXAM: CHEST  2 VIEW COMPARISON:  09/05/2014 FINDINGS: Cardiac silhouette is mildly enlarged. Left anterior chest wall biventricular cardioverter-defibrillator is stable and well positioned. No mediastinal or hilar masses or evidence of adenopathy. The lungs are clear. No evidence of pulmonary edema. No pleural effusion or pneumothorax. Bony thorax is intact. IMPRESSION: No acute cardiopulmonary disease. Electronically Signed   By: Lajean Manes M.D.   On: 03/07/2016 14:08   I have personally reviewed and evaluated these images and lab results as part of my medical decision-making.   EKG  Interpretation   Date/Time:  Sunday March 07 2016 14:52:03 EDT Ventricular Rate:  92 PR Interval:  116 QRS Duration: 172 QT Interval:  454 QTC Calculation: 561 R Axis:   -97 Text Interpretation:  Atrial-sensed ventricular-paced rhythm Abnormal ECG  No significant change since last tracing Confirmed by KNAPP  MD-J, JON  [id number]) on 03/07/2016 3:15:17 PM Also confirmed by KNAPP  MD-J, JON [id number]),  editor Gilford Rile, CCT, SANDRA ([id number])  on 03/07/2016 3:41:27 PM      MDM troponin negative,  Chest xray no chf.  Pt given tylenol for headache.    Final diagnoses:  Chest pain, unspecified chest pain type  Shortness of breath    I spoke to Dr. Susy Manor Cardiology fellow on call.   He will admit pt for observation.      Hollace Kinnier Prosper, PA-C 03/07/16 1846  Nat Christen, MD 03/08/16 [phone]

## 2016-03-07 NOTE — H&P (Addendum)
EP: Dr. Lovena Le -- last visit 11/07/2015   CC: Chest discomfort/congestion  HPI: 65 yo [place] man with non-ischemic CM, chronic systolic HF, s/p Medtronic CRT-D implanted 03/2010 (last check 10/2015 battery was approaching ERI), obesity, asthma and other medical issues noted below presents to ER for evaluation. He was initially seen earlier today at the urgent care and then referred to ER.   His symptoms started few days ago with nasal and sinus congestion and headache follow by congestion in chest and SOB but no cough. Wife reports he was wheezing yesterday. He took OTC cold medicine by mouth without much help. He felt warm couple days ago with sweating. Denies chest pain, palpitations, orthopnea (still using 2 pillows as always), PND, pedal edema, dizziness syncope, N/V. He continues to work without problem which also requires lifting 50 lbs bags. Does not smoke. Reports history of asthma but has not had to use inhalers in a long time. Wife reports loud snoring.   He also reports hearing a beep from his device last night.     Review of Systems:  10 systems reviewed unremarkable except as noted in HPI    Past Medical History  Diagnosis Date  . Hyperlipidemia   . Hypertension   . Anemia   . NICM (nonischemic cardiomyopathy) ([place])     a. Dx 2011 - presented with sustained VT. Normal coronaries at that time, EF 10-15% by cath and 20-25% by echo. b. s/p biventricular ICD 2011. c. CP 08/2014: mild nonobstructive CAD.  Marland Kitchen Gastric ulcer 2011  . Ventricular tachycardia ([place])     a. Sustained VT 2011 s/p MDT [id number] Concert BiV ICD, ser# [id number] H.  . Postsurgical hypothyroidism   . Obesity   . Torn meniscus   . LBBB (left bundle branch block)   . Automatic implantable cardioverter-defibrillator in situ   . CHF (congestive heart failure) ([place])   . Type II diabetes mellitus ([place])   . Childhood asthma   . GERD (gastroesophageal reflux disease)   . Arthritis     "knees" (09/05/2014)    . History of gout   . Thyroid cancer ([place])     a. 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 Dr Harlow Asa. s/p thyroidectomy 2013.  Marland Kitchen CAD (coronary artery disease)     a. Mild nonobstructive CAD by cath 08/2014.   No current facility-administered medications on file prior to encounter.   Current Outpatient Prescriptions on File Prior to Encounter  Medication Sig Dispense Refill  . allopurinol (ZYLOPRIM) 100 MG tablet Take 1 tablet (100 mg total) by mouth daily. 90 tablet 1  . aspirin EC 81 MG tablet Take 1 tablet (81 mg total) by mouth daily. (Patient taking differently: Take 81 mg by mouth at bedtime. )    . carvedilol (COREG) 6.25 MG tablet Take 1 tablet (6.25 mg total) by mouth 2 (two) times daily with a meal. 180 tablet 3  . furosemide (LASIX) 40 MG tablet TAKE 1/2 TABLET (20 MG) BY MOUTH ONCE DAILY 15 tablet 9  . metFORMIN (GLUCOPHAGE) 1000 MG tablet Take 1 tablet (1,000 mg total) by mouth 2 (two) times daily with a meal. 180 tablet 0  . Omega-3 Fatty Acids (FISH OIL) 1000 MG CAPS Take 1,000 mg by mouth 2 (two) times daily.     Marland Kitchen POTASSIUM CHLORIDE PO Take 1 tablet by mouth daily. OTC    . tadalafil (CIALIS) 20 MG tablet Take 20 mg by mouth daily as needed for erectile dysfunction.    Marland Kitchen  vitamin E 400 UNIT capsule Take 400 Units by mouth daily.    Marland Kitchen levothyroxine (SYNTHROID, LEVOTHROID) 100 MCG tablet Take 1 tablet (100 mcg total) by mouth daily before breakfast. (Patient not taking: Reported on 03/07/2016) 30 tablet 0  . [DISCONTINUED] calcium carbonate (OS-CAL - DOSED IN MG OF ELEMENTAL CALCIUM) 1250 MG tablet Take 2 tablets (1,000 mg of elemental calcium total) by mouth 3 (three) times daily. 50 tablet 0     Allergies  Allergen Reactions  . Losartan Other (See Comments)    Leg cramps  . Ramipril Cough    Social History   Social History  . Marital Status: Married    Spouse Name: N/A  . Number of Children: 2  . Years of Education: N/A   Occupational History  .  Warehouse work    Social History Main Topics  . Smoking status: Former Smoker -- 2.50 packs/day for 2 years    Types: Cigarettes    Quit date: 11/29/1970  . Smokeless tobacco: Former Systems developer    Types: Chew  . Alcohol Use: Yes     Comment: 09/05/2014 "2 drinks q couple months, if that  . Drug Use: No  . Sexual Activity: Yes   Other Topics Concern  . Not on file   Social History Narrative   Live w/ wife , daughter lives w/ them - in [place]   7 dogs, fish pond.   Unloads trucks for a living.   Does not routinely exercise.    Family History  Problem Relation Age of Onset  . Diabetes Maternal Grandfather   . Diabetes Maternal Aunt   . Colon cancer Maternal Aunt   . Colon cancer Maternal Aunt   . Lung cancer Paternal Uncle   . Lung cancer Maternal Uncle   . Asthma Sister   . Atrial fibrillation Father   . Prostate cancer Neg Hx   . CAD Neg Hx   . Hypothyroidism Mother   . Hypothyroidism Daughter   . Hypertension Mother     alive @ 83.  Marland Kitchen Hypertension Father     alive @ 49.    PHYSICAL EXAM: Filed Vitals:   03/07/16 1645 03/07/16 1715  BP: 146/80 156/88  Pulse: 89 80  Temp:    Resp: 19 18   General:  Well appearing. No respiratory difficulty at rest. Awake and alert.  HEENT: normal Neck: supple. no JVD. Carotids 2+ bilat; no bruits. No lymphadenopathy or thryomegaly appreciated. Cor: PMI nondisplaced. Regular rate & rhythm. No rubs, gallops or murmurs. Lungs: no wheezing or crackles. Abdomen: soft, nontender, nondistended. No hepatosplenomegaly. No bruits or masses. Good bowel sounds. Extremities: no cyanosis, clubbing, rash, edema Neuro: alert & oriented x 3, cranial nerves grossly intact. moves all 4 extremities w/o difficulty. Affect pleasant.  ECG: A sensed BiV paced rhythm at 92 bpm   Results for orders placed or performed during the hospital encounter of 03/07/16 (from the past 24 hour(s))  Basic metabolic panel     Status: Abnormal   Collection Time: 03/07/16   3:03 PM  Result Value Ref Range   Sodium 141 135 - 145 mmol/L   Potassium 3.9 3.5 - 5.1 mmol/L   Chloride 104 101 - 111 mmol/L   CO2 24 22 - 32 mmol/L   Glucose, Bld 150 (H) 65 - 99 mg/dL   BUN 11 6 - 20 mg/dL   Creatinine, Ser 1.02 0.61 - 1.24 mg/dL   Calcium 8.9 8.9 - 10.3 mg/dL   GFR calc  non Af Amer >60 >60 mL/min   GFR calc Af Amer >60 >60 mL/min   Anion gap 13 5 - 15  CBC     Status: Abnormal   Collection Time: 03/07/16  3:03 PM  Result Value Ref Range   WBC 7.4 4.0 - 10.5 K/uL   RBC 4.07 (L) 4.22 - 5.81 MIL/uL   Hemoglobin 12.7 (L) 13.0 - 17.0 g/dL   HCT 37.7 (L) 39.0 - 52.0 %   MCV 92.6 78.0 - 100.0 fL   MCH 31.2 26.0 - 34.0 pg   MCHC 33.7 30.0 - 36.0 g/dL   RDW 13.5 11.5 - 15.5 %   Platelets 204 150 - 400 K/uL  I-stat troponin, ED     Status: None   Collection Time: 03/07/16  3:16 PM  Result Value Ref Range   Troponin i, poc 0.02 0.00 - 0.08 ng/mL   Comment 3           Dg Chest 2 View  03/07/2016  CLINICAL DATA:  Shortness of breath and congestion EXAM: CHEST  2 VIEW COMPARISON:  03/07/2016 FINDINGS: Cardiac shadow is again mildly enlarged. A defibrillator is again noted. The lungs are well aerated bilaterally without focal infiltrate or sizable effusion. No acute bony abnormality is noted. IMPRESSION: No acute abnormality noted. There is no significant change from the film obtained 3 hours previous. Electronically Signed   By: Inez Catalina M.D.   On: 03/07/2016 15:35   Dg Chest 2 View  03/07/2016  CLINICAL DATA:  Shortness of breath starting 4 days ago. History CHF. EXAM: CHEST  2 VIEW COMPARISON:  09/05/2014 FINDINGS: Cardiac silhouette is mildly enlarged. Left anterior chest wall biventricular cardioverter-defibrillator is stable and well positioned. No mediastinal or hilar masses or evidence of adenopathy. The lungs are clear. No evidence of pulmonary edema. No pleural effusion or pneumothorax. Bony thorax is intact. IMPRESSION: No acute cardiopulmonary disease.  Electronically Signed   By: Lajean Manes M.D.   On: 03/07/2016 14:08   Cardiac Cath 09/06/2014 Left main: No obstructive disease.  Left Anterior Descending Artery: Large caliber vessel that courses to the apex. There are mild luminal irregularities in the mid vessel. There are several small caliber diagonal branches with no obstructive disease.  Circumflex Artery: Large caliber vessel with termination into a large caliber bifurcating obtuse marginal branch. There is a very small caliber AV groove branch that has an ostial 99% stenosis. This vessel is unchanged from cath in 2011 and too small for PCI.  Right Coronary Artery: Large dominant vessel with mild luminal irregularities mid vessel.  Left Ventricular Angiogram: LVEF=20-25%, global hypokinesis.  Impression:  1. Mild non-obstructive CAD  2. Severe LV systolic dysfunction Left main: No obstructive disease.  Left Anterior Descending Artery: Large caliber vessel that courses to the apex. There are mild luminal irregularities in the mid vessel. There are several small caliber diagonal branches with no obstructive disease.  Circumflex Artery: Large caliber vessel with termination into a large caliber bifurcating obtuse marginal branch. There is a very small caliber AV groove branch that has an ostial 99% stenosis. This vessel is unchanged from cath in 2011 and too small for PCI.  Right Coronary Artery: Large dominant vessel with mild luminal irregularities mid vessel.  Left Ventricular Angiogram: LVEF=20-25%, global hypokinesis.  Impression:  1. Mild non-obstructive CAD  2. Severe LV systolic dysfunction     ASSESSMENT:  1. Chest tightness.  - Not entirely clear of the cause; could be pulmonary cause like  bronchitis or asthma  - initial Trop negative; do not think ACS; cath in 08/2014 revealed non-obstructive CAD  - No evidence of overt HF, CXR no pulm edema  2. History of non-ischemic CM, chronic systolic HF - appears  euvolemic  - has Medtronic CRT-D since 03/2010; reports hearing a beep yesterday -- battery at ERI?    PLAN/DISCUSSION:  Admit for observation  Cycle Trop  SSI for DM, hold Metformin  Check CRT-D Bronchodilator as needed  Please refer to orders for details     Wandra Mannan, MD Cardiology    He has elevated SBP over 150 and is not on ACE-I or ARB due to prior allergy/adverse reaction, will start him on Bidil half tablet po bid (HFrEF) which can the be increased later if tolerated.  Wandra Mannan, MD

## 2016-03-08 DIAGNOSIS — I5023 Acute on chronic systolic (congestive) heart failure: Secondary | ICD-10-CM | POA: Diagnosis not present

## 2016-03-08 LAB — GLUCOSE, CAPILLARY
GLUCOSE-CAPILLARY: 171 mg/dL — AB (ref 65–99)
Glucose-Capillary: 127 mg/dL — ABNORMAL HIGH (ref 65–99)
Glucose-Capillary: 129 mg/dL — ABNORMAL HIGH (ref 65–99)
Glucose-Capillary: 131 mg/dL — ABNORMAL HIGH (ref 65–99)
Glucose-Capillary: 131 mg/dL — ABNORMAL HIGH (ref 65–99)
Glucose-Capillary: 182 mg/dL — ABNORMAL HIGH (ref 65–99)
Glucose-Capillary: 211 mg/dL — ABNORMAL HIGH (ref 65–99)

## 2016-03-08 LAB — BASIC METABOLIC PANEL
ANION GAP: 12 (ref 5–15)
BUN: 12 mg/dL (ref 6–20)
CHLORIDE: 102 mmol/L (ref 101–111)
CO2: 26 mmol/L (ref 22–32)
Calcium: 8.5 mg/dL — ABNORMAL LOW (ref 8.9–10.3)
Creatinine, Ser: 1.07 mg/dL (ref 0.61–1.24)
GFR calc Af Amer: >60 mL/min (ref >60)
GFR calc non Af Amer: >60 mL/min (ref >60)
GLUCOSE: 222 mg/dL — AB (ref 65–99)
Potassium: 3.7 mmol/L (ref 3.5–5.1)
Sodium: 140 mmol/L (ref 135–145)

## 2016-03-08 LAB — TROPONIN I: TROPONIN I: 0.04 ng/mL — AB (ref <0.031)

## 2016-03-08 MED ORDER — FUROSEMIDE 10 MG/ML IJ SOLN
40.0000 mg | Freq: Once | INTRAMUSCULAR | Status: AC
  Administered 2016-03-08: 40 mg via INTRAVENOUS
  Filled 2016-03-08: qty 4

## 2016-03-08 MED ORDER — LIVING BETTER WITH HEART FAILURE BOOK
Freq: Once | Status: AC
  Administered 2016-03-09: 10:00:00

## 2016-03-08 NOTE — Progress Notes (Signed)
SUBJECTIVE:  Feeling a little less congestion today but still not at baseline.  He is not describing chest pain.    PHYSICAL EXAM Filed Vitals:   03/07/16 1915 03/07/16 2000 03/07/16 2020 03/08/16 0500  BP: 151/85 163/89 157/77 142/73  Pulse: 79 86 84 68  Temp:   98.3 F (36.8 C) 98.6 F (37 C)  TempSrc:   Oral Oral  Resp: 22 21    Height:   6\' 2"  (1.88 m)   Weight:    261 lb 8 oz (118.616 kg)  SpO2: 98% 95% 95% 95%   General:  No distress Lungs:  Decreased breath sounds Heart:  RRR Abdomen:  Positive bowel sounds, no rebound no guarding Extremities:  No edema    Neuro:  Nonfocal   LABS: Lab Results  Component Value Date   TROPONINI 0.04* 03/08/2016   Results for orders placed or performed during the hospital encounter of 03/07/16 (from the past 24 hour(s))  Basic metabolic panel     Status: Abnormal   Collection Time: 03/07/16  3:03 PM  Result Value Ref Range   Sodium 141 135 - 145 mmol/L   Potassium 3.9 3.5 - 5.1 mmol/L   Chloride 104 101 - 111 mmol/L   CO2 24 22 - 32 mmol/L   Glucose, Bld 150 (H) 65 - 99 mg/dL   BUN 11 6 - 20 mg/dL   Creatinine, Ser 1.02 0.61 - 1.24 mg/dL   Calcium 8.9 8.9 - 10.3 mg/dL   GFR calc non Af Amer >60 >60 mL/min   GFR calc Af Amer >60 >60 mL/min   Anion gap 13 5 - 15  CBC     Status: Abnormal   Collection Time: 03/07/16  3:03 PM  Result Value Ref Range   WBC 7.4 4.0 - 10.5 K/uL   RBC 4.07 (L) 4.22 - 5.81 MIL/uL   Hemoglobin 12.7 (L) 13.0 - 17.0 g/dL   HCT 37.7 (L) 39.0 - 52.0 %   MCV 92.6 78.0 - 100.0 fL   MCH 31.2 26.0 - 34.0 pg   MCHC 33.7 30.0 - 36.0 g/dL   RDW 13.5 11.5 - 15.5 %   Platelets 204 150 - 400 K/uL  I-stat troponin, ED     Status: None   Collection Time: 03/07/16  3:16 PM  Result Value Ref Range   Troponin i, poc 0.02 0.00 - 0.08 ng/mL   Comment 3          Troponin I     Status: Abnormal   Collection Time: 03/07/16  8:25 PM  Result Value Ref Range   Troponin I 0.04 (H) <0.031 ng/mL  Glucose, capillary      Status: Abnormal   Collection Time: 03/08/16 12:27 AM  Result Value Ref Range   Glucose-Capillary 131 (H) 65 - 99 mg/dL  Troponin I     Status: Abnormal   Collection Time: 03/08/16  2:36 AM  Result Value Ref Range   Troponin I 0.04 (H) <0.031 ng/mL  Glucose, capillary     Status: Abnormal   Collection Time: 03/08/16  6:38 AM  Result Value Ref Range   Glucose-Capillary 129 (H) 65 - 99 mg/dL  Glucose, capillary     Status: Abnormal   Collection Time: 03/08/16  7:53 AM  Result Value Ref Range   Glucose-Capillary 131 (H) 65 - 99 mg/dL   No intake or output data in the 24 hours ending 03/08/16 0834    ASSESSMENT AND PLAN:  CHEST  PAIN:    Troponin is non diagnostic.    I suspect that there is some mild volume overload that could explain the symptoms and the mild trop elevation.  I am not considering any interventional procedures.  I will give a dose of IV diuresis today.  Probably home in the AM.  ICD:  Device has been interrogated by the ED but I am not sure what that showed.  I have asked [employer] to come and evaluate and for EP to comment on findings.    ACUTE on CHRONIC SYSTOLIC AND DIASTOLIC HF:  Started Bidil yesterday.   BNP is elevated.   See above.    HTN:  This is being managed in the context of treating his CHF   Minus Breeding 03/08/2016 8:34 AM

## 2016-03-08 NOTE — Plan of Care (Signed)
Problem: Activity: Goal: Capacity to carry out activities will improve Outcome: Progressing Ambulates in hallway  Problem: Cardiac: Goal: Ability to achieve and maintain adequate cardiopulmonary perfusion will improve Outcome: Progressing Vital Signs Stable  Problem: Education: Goal: Ability to demonstrate managment of disease process will improve Outcome: Progressing Heart Failure booklet ordered. Goal: Ability to verbalize understanding of medication therapies will improve Outcome: Progressing Verbalizes understanding of current medication regime.

## 2016-03-08 NOTE — Research (Signed)
REDS_0  Informed Consent   Subject Name: Mike Jimenez  Subject met inclusion and exclusion criteria.  The informed consent form, study requirements and expectations were reviewed with the subject and questions and concerns were addressed prior to the signing of the consent form.  The subject verbalized understanding of the trail requirements.  The subject agreed to participate in the REDS_1  trial and signed the informed consent.  The informed consent was obtained prior to performance of any protocol-specific procedures for the subject.  A copy of the signed informed consent was given to the subject and a copy was placed in the subject's medical record.  Sandie Ano 03/08/2016, 10:47 AM

## 2016-03-09 DIAGNOSIS — I5023 Acute on chronic systolic (congestive) heart failure: Secondary | ICD-10-CM | POA: Diagnosis not present

## 2016-03-09 LAB — BASIC METABOLIC PANEL
Anion gap: 13 (ref 5–15)
BUN: 16 mg/dL (ref 6–20)
CHLORIDE: 102 mmol/L (ref 101–111)
CO2: 27 mmol/L (ref 22–32)
CREATININE: 1.09 mg/dL (ref 0.61–1.24)
Calcium: 8.7 mg/dL — ABNORMAL LOW (ref 8.9–10.3)
GFR calc Af Amer: >60 mL/min (ref >60)
GFR calc non Af Amer: >60 mL/min (ref >60)
Glucose, Bld: 115 mg/dL — ABNORMAL HIGH (ref 65–99)
POTASSIUM: 3.5 mmol/L (ref 3.5–5.1)
SODIUM: 142 mmol/L (ref 135–145)

## 2016-03-09 LAB — GLUCOSE, CAPILLARY
GLUCOSE-CAPILLARY: 127 mg/dL — AB (ref 65–99)
GLUCOSE-CAPILLARY: 114 mg/dL — AB (ref 65–99)

## 2016-03-09 MED ORDER — POTASSIUM CHLORIDE ER 10 MEQ PO TBCR: EXTENDED_RELEASE_TABLET | ORAL | Status: DC

## 2016-03-09 MED ORDER — FUROSEMIDE 40 MG PO TABS: ORAL_TABLET | ORAL | Status: DC

## 2016-03-09 MED ORDER — ISOSORB DINITRATE-HYDRALAZINE 20-37.5 MG PO TABS: 0.5000 | ORAL_TABLET | Freq: Two times a day (BID) | ORAL | Status: DC

## 2016-03-09 NOTE — Research (Signed)
ReDS Vest Discharge Study  Results of ReDS reading  Your patient is in the Blinded arm of the Vest at Discharge study.  Your patient has had a ReDS Vest reading and the reading has been transmitted to the cloud.  Your patient is ok for discharge.    Thank You   The research team    

## 2016-03-09 NOTE — Discharge Summary (Signed)
Discharge Summary    Patient ID: Mike Jimenez,  MRN: [id number], DOB/AGE: 08-23-1951 65 y.o.  Admit date: 03/07/2016 Discharge date: 03/09/2016  Primary Care Provider: Kathlene November Primary Cardiologist: Dr. Lovena Le  Discharge Diagnoses    Principal Problem:   Acute on chronic systolic HF (heart failure) ([place]) Active Problems:   DM II (diabetes mellitus, type II), controlled ([place])   Hypertension   Hyperlipidemia   Chest pain   Allergies Allergies  Allergen Reactions  . Losartan Other (See Comments)    Leg cramps  . Ramipril Cough    Diagnostic Studies/Procedures   _____________   History of Present Illness   [name] is a 65 yo male with non-ischemic CM, chronic systolic CHF (EF 0000000 by Echo in Oct. 2015), s/p Medtronic CRT-D implanted 03/2010 (last check 10/2015 battery was approaching ERI), obesity, and asthma who presented to the ED on 03/07/16 with chest tightness. He reported a few days of nasal congestion with some SOB, no cough. He was admitted for observation and his troponin was cycled.   Hospital Course  His troponin was slightly elevated at 0.04, did not peak. Elevation was felt to be due to CHF exacerbation. No ischemic evaluation was necessary.    BNP was elevated at 1373 and he received IV Lasix, weight down 4 pounds (256 lbs at discharge).  According to last device interrogation he is approaching ERI. Device was interrogated in the ED.  He has follow up appointment with EP in May.    His SOB resolved with IV diuresis and his home dose Lasix was increased for 5 days with potassium supplementation. He was also started on Bidil and will continue this at home. He will likely need repeat Echo outpatient.   He was seen by Dr. Percival Spanish and deemed suitable for discharge.   _____________  Discharge Vitals Blood pressure 142/81, pulse 63, temperature 98.2 F (36.8 C), temperature source Oral, resp. rate 20, height 6\' 2"  (1.88 m), weight 256 lb 3.2 oz (116.212 kg),  SpO2 97 %.  Filed Weights   03/07/16 1454 03/08/16 0500 03/09/16 0509  Weight: 260 lb (117.935 kg) 261 lb 8 oz (118.616 kg) 256 lb 3.2 oz (116.212 kg)    Labs & Radiologic Studies     CBC  Recent Labs  03/07/16 1355 03/07/16 1503  WBC 7.6 7.4  HGB 12.3* 12.7*  HCT 35.4* 37.7*  MCV 90.8 92.6  PLT  --  0000000   Basic Metabolic Panel  Recent Labs  03/08/16 1004 03/09/16 0314  NA 140 142  K 3.7 3.5  CL 102 102  CO2 26 27  GLUCOSE 222* 115*  BUN 12 16  CREATININE 1.07 1.09  CALCIUM 8.5* 8.7*   Liver Function Tests  Recent Labs  03/07/16 1346  AST 14  ALT 12  ALKPHOS 62  BILITOT 1.5*  PROT 6.8  ALBUMIN 4.1   Cardiac Enzymes  Recent Labs  03/07/16 2025 03/08/16 0236  TROPONINI 0.04* 0.04*    Dg Chest 2 View  03/07/2016  CLINICAL DATA:  Shortness of breath and congestion EXAM: CHEST  2 VIEW COMPARISON:  03/07/2016 FINDINGS: Cardiac shadow is again mildly enlarged. A defibrillator is again noted. The lungs are well aerated bilaterally without focal infiltrate or sizable effusion. No acute bony abnormality is noted. IMPRESSION: No acute abnormality noted. There is no significant change from the film obtained 3 hours previous. Electronically Signed   By: Inez Catalina M.D.   On: 03/07/2016 15:35  Dg Chest 2 View  03/07/2016  CLINICAL DATA:  Shortness of breath starting 4 days ago. History CHF. EXAM: CHEST  2 VIEW COMPARISON:  09/05/2014 FINDINGS: Cardiac silhouette is mildly enlarged. Left anterior chest wall biventricular cardioverter-defibrillator is stable and well positioned. No mediastinal or hilar masses or evidence of adenopathy. The lungs are clear. No evidence of pulmonary edema. No pleural effusion or pneumothorax. Bony thorax is intact. IMPRESSION: No acute cardiopulmonary disease. Electronically Signed   By: Lajean Manes M.D.   On: 03/07/2016 14:08    Disposition   Pt is being discharged home today in good condition.  Follow-up Plans & Appointments     Follow-up Information    Follow up with Cristopher Peru, MD On 04/20/2016.   Specialty:  Cardiology   Why:  at 8:45 am    Contact information:   [address] [place] [address] [phone]      Discharge Instructions    Diet - low sodium heart healthy    Complete by:  As directed      Increase activity slowly    Complete by:  As directed            Discharge Medications   Current Discharge Medication List    START taking these medications   Details  isosorbide-hydrALAZINE (BIDIL) 20-37.5 MG tablet Take 0.5 tablets by mouth 2 (two) times daily. Qty: 30 tablet, Refills: 6    potassium chloride (K-DUR) 10 MEQ tablet Take 2 tablets (51mEq) daily for 5 days, then one tablet (88mEq) daily. Qty: 30 tablet, Refills: 6      CONTINUE these medications which have CHANGED   Details  furosemide (LASIX) 40 MG tablet Take 2 tablets (40mg ) daily for 5 days, then 1 tablet (20mg ) daily. Qty: 30 tablet, Refills: 9      CONTINUE these medications which have NOT CHANGED   Details  acetaminophen (TYLENOL) 500 MG tablet Take 1,000 mg by mouth every 6 (six) hours as needed (pain).    allopurinol (ZYLOPRIM) 100 MG tablet Take 1 tablet (100 mg total) by mouth daily. Qty: 90 tablet, Refills: 1    aspirin EC 81 MG tablet Take 1 tablet (81 mg total) by mouth daily.   Associated Diagnoses: NICM (nonischemic cardiomyopathy) ([facility])    atorvastatin (LIPITOR) 20 MG tablet Take 20 mg by mouth daily.    carvedilol (COREG) 6.25 MG tablet Take 1 tablet (6.25 mg total) by mouth 2 (two) times daily with a meal. Qty: 180 tablet, Refills: 3    levothyroxine (SYNTHROID, LEVOTHROID) 200 MCG tablet Take 200 mcg by mouth daily before breakfast. Refills: 5    metFORMIN (GLUCOPHAGE) 1000 MG tablet Take 1 tablet (1,000 mg total) by mouth 2 (two) times daily with a meal. Qty: 180 tablet, Refills: 0    Multiple Vitamin (MULTIVITAMIN WITH MINERALS) TABS tablet Take 1 tablet by mouth  daily.    Omega-3 Fatty Acids (FISH OIL) 1000 MG CAPS Take 1,000 mg by mouth 2 (two) times daily.     tadalafil (CIALIS) 20 MG tablet Take 20 mg by mouth daily as needed for erectile dysfunction.    vitamin E 400 UNIT capsule Take 400 Units by mouth daily.      STOP taking these medications     POTASSIUM CHLORIDE PO           Outstanding Labs/Studies    Duration of Discharge Encounter   Greater than 30 minutes including physician time.  Signed, Arbutus Leas NP  03/09/2016, 9:48 AM   Patient seen and examined.  Plan as discussed in my rounding note for today and outlined above. Jeneen Rinks [facility]  03/09/2016  11:03 AM

## 2016-03-09 NOTE — Progress Notes (Signed)
SUBJECTIVE:  Breathing better.  No chest pain.  Ambulated.    PHYSICAL EXAM Filed Vitals:   03/08/16 1328 03/08/16 1600 03/08/16 2027 03/09/16 0509  BP: 136/74 129/76 141/84 142/81  Pulse: 69 67 62 63  Temp: 98.6 F (37 C) 98.5 F (36.9 C) 98.3 F (36.8 C) 98.2 F (36.8 C)  TempSrc: Oral Oral Oral Oral  Resp:    20  Height:      Weight:    256 lb 3.2 oz (116.212 kg)  SpO2: 96% 96% 97% 97%   General:  No distress Lungs:  Normal breath sounds Heart:  RRR Abdomen:  Positive bowel sounds, no rebound no guarding Extremities:  No edema      LABS: Lab Results  Component Value Date   TROPONINI 0.04* 03/08/2016   Results for orders placed or performed during the hospital encounter of 03/07/16 (from the past 24 hour(s))  Basic metabolic panel     Status: Abnormal   Collection Time: 03/08/16 10:04 AM  Result Value Ref Range   Sodium 140 135 - 145 mmol/L   Potassium 3.7 3.5 - 5.1 mmol/L   Chloride 102 101 - 111 mmol/L   CO2 26 22 - 32 mmol/L   Glucose, Bld 222 (H) 65 - 99 mg/dL   BUN 12 6 - 20 mg/dL   Creatinine, Ser 1.07 0.61 - 1.24 mg/dL   Calcium 8.5 (L) 8.9 - 10.3 mg/dL   GFR calc non Af Amer >60 >60 mL/min   GFR calc Af Amer >60 >60 mL/min   Anion gap 12 5 - 15  Glucose, capillary     Status: Abnormal   Collection Time: 03/08/16 11:20 AM  Result Value Ref Range   Glucose-Capillary 211 (H) 65 - 99 mg/dL  Glucose, capillary     Status: Abnormal   Collection Time: 03/08/16  4:22 PM  Result Value Ref Range   Glucose-Capillary 171 (H) 65 - 99 mg/dL  Glucose, capillary     Status: Abnormal   Collection Time: 03/08/16  9:12 PM  Result Value Ref Range   Glucose-Capillary 127 (H) 65 - 99 mg/dL  Basic metabolic panel     Status: Abnormal   Collection Time: 03/09/16  3:14 AM  Result Value Ref Range   Sodium 142 135 - 145 mmol/L   Potassium 3.5 3.5 - 5.1 mmol/L   Chloride 102 101 - 111 mmol/L   CO2 27 22 - 32 mmol/L   Glucose, Bld 115 (H) 65 - 99 mg/dL   BUN 16 6  - 20 mg/dL   Creatinine, Ser 1.09 0.61 - 1.24 mg/dL   Calcium 8.7 (L) 8.9 - 10.3 mg/dL   GFR calc non Af Amer >60 >60 mL/min   GFR calc Af Amer >60 >60 mL/min   Anion gap 13 5 - 15  Glucose, capillary     Status: Abnormal   Collection Time: 03/09/16  4:57 AM  Result Value Ref Range   Glucose-Capillary 127 (H) 65 - 99 mg/dL    Intake/Output Summary (Last 24 hours) at 03/09/16 0801 Last data filed at 03/08/16 1800  Gross per 24 hour  Intake    480 ml  Output   1900 ml  Net  -1420 ml      ASSESSMENT AND PLAN:  CHEST PAIN:    Troponin is non diagnostic.    Device interrogation demonstrated some evidence of volume overload.  Good diuresis.  No further symptoms and no further work up.  ICD:  Device has been interrogated.  The patient was not likely hearing an alarm.  He is approaching ERI and will follow with EP to discuss the timing of his battery being changed.   Needs EP follow up appt at discharge.   ACUTE on CHRONIC SYSTOLIC AND DIASTOLIC HF:  See above.  OK to send home on 40 mg of Lasix daily for the next five days and then change back to previous 20 mg dose.  Take KDur 20 meq daily with 40 mg of Lasix then resume previous dose of potassium.    HTN:  This is being managed in the context of treating his CHF   Minus Breeding 03/09/2016 8:01 AM

## 2016-03-09 NOTE — Care Management Note (Addendum)
Case Management Note  Patient Details  Name: BENTON VENDITTO MRN: [id number] Date of Birth: [birth year]  Subjective/Objective:     CHF               Action/Plan: Discharge Planning: AVS reviewed:  Chart reviewed. Call received from [facility] and Lasix pill count and dose each day. NCM Rosendo Gros will follow up with Jettie Booze NP and pharmacy for corrections. NCM spoke to pt at bedside. No NCM needs identified. He can afford his medications. Unit RN will complete CHF teaching with pt prior to dc.   PCP Dr Kathlene November   Expected Discharge Date:  03/09/2016               Expected Discharge Plan:  Home/Self Care  In-House Referral:  NA  Discharge planning Services  CM Consult  Post Acute Care Choice:  NA Choice offered to:  NA  DME Arranged:  N/A DME Agency:  NA  HH Arranged:  NA HH Agency:  NA  Status of Service:  Completed, signed off  Medicare Important Message Given:    Date Medicare IM Given:    Medicare IM give by:    Date Additional Medicare IM Given:    Additional Medicare Important Message give by:     If discussed at [place] of Stay Meetings, dates discussed:    Additional Comments:  Erenest Rasher, RN 03/09/2016, 10:09 AM

## 2016-03-09 NOTE — Discharge Planning (Addendum)
Pharmacy called to clarify lasix order.  ERCM contacted RNCM for floor to find that quantity should be 20 mg  tablets.  ERCM returned call to pharmacy to clarify.

## 2016-03-10 ENCOUNTER — Encounter: Payer: Self-pay | Admitting: Internal Medicine

## 2016-03-10 ENCOUNTER — Telehealth: Payer: Self-pay | Admitting: Internal Medicine

## 2016-03-10 ENCOUNTER — Telehealth: Payer: Self-pay

## 2016-03-10 NOTE — Telephone Encounter (Signed)
New Message:    Pt says he need a letter stating that he can return to work on 03-15-16 with no restrictions. Please fax to [phone] Att: Kelby Aline

## 2016-03-10 NOTE — Telephone Encounter (Signed)
03/10/2016  Transition Care Management Follow-up Telephone Call  ADMISSION DATE: 03/07/16  DISCHARGE DATE: 03/09/16   How have you been since you were released from the hospital? Patient states he has felt fine since discharge.   Do you understand why you were in the hospital? YES   Do you understand the discharge instrcutions?  Yes   eMedications reviewed:  Reviewed discharge medications with patient    Allergies reviewed: Losartan and Ramipril  Dietary changes reviewed :Low Sodium Heart Healthy       Referrals reviewed: [facility] 03/10/16 and Dr. Cristopher Peru, Cardiology 04/20/16  Functional Questionnaire:   Activities of Daily Living (ADLs):  No help needed at this time   Any transportation issues/concerns?: No transportation concerns   Any patient concerns? Legs stopped cramping after discontinuing Losartan.    Confirmed importance and date/time of follow-up visits scheduled: Yes   Confirmed with patient if condition begins to worsen call PCP or go to the ER.  Yes   Patient was given the [place] line [phone]:  Yes

## 2016-03-11 ENCOUNTER — Encounter: Payer: Self-pay | Admitting: Family Medicine

## 2016-03-11 ENCOUNTER — Ambulatory Visit (INDEPENDENT_AMBULATORY_CARE_PROVIDER_SITE_OTHER): Payer: 59 | Admitting: Family Medicine

## 2016-03-11 VITALS — BP 130/78 | HR 78 | Temp 98.2°F | Ht 74.0 in | Wt 257.8 lb

## 2016-03-11 DIAGNOSIS — Z09 Encounter for follow-up examination after completed treatment for conditions other than malignant neoplasm: Secondary | ICD-10-CM

## 2016-03-11 DIAGNOSIS — I5023 Acute on chronic systolic (congestive) heart failure: Secondary | ICD-10-CM

## 2016-03-11 DIAGNOSIS — Z1211 Encounter for screening for malignant neoplasm of colon: Secondary | ICD-10-CM

## 2016-03-11 NOTE — Progress Notes (Signed)
Pre visit review using our clinic review tool, if applicable. No additional management support is needed unless otherwise documented below in the visit note. 

## 2016-03-11 NOTE — Patient Instructions (Signed)
It was good to see you today- it looks like you are doing very well at this time.   If you have any symptoms such as weight gain, shortness of breath, ankle swelling, inability to lie flat, muscle cramps or ankle swelling please seek help with [place] or your cardiologist.

## 2016-03-11 NOTE — Progress Notes (Signed)
[place] at [facility] [address], [place], [place] [address] [phone]  Date:  03/11/2016   Name:  Mike Jimenez   DOB:  Feb 18, 1951   MRN:  [id number]  PCP:  Kathlene November, MD    Chief Complaint: Hospitalization Follow-up   History of Present Illness:  Mike Jimenez is a 65 y.o. very pleasant male patient who presents with the following:  He was dx with CHF in 2011 Here today for a hospital follow-up visit.  He was admitted from 4/9 to 4/11 with acute on chronic HF, also DM, HTN, hyperlipidemia and CP.   He also has a CRT-Din place.   He went to the ER on 4/9 with chest tightness- noted to have slighlty elevated troponin thought due to CHF as opposed to ACS. BNP was at 1373, he got IV lasix and diuresed several lbs.  SOB went away, they increased his home lasix and potassium for 5 days. He also started on bidil.    He notes that he is feeling overall well since his discharge.  He notes some HA which is probably is from the bidil.  His weight is stable.  Breathing is "very good," no chest discomfort.  He is still coughing up some mucus. He is able to lie flat at night. Ankles are not swollen.   No muscle cramps.  He is feeling well overall History of hypothyroidism.  This is managed by his endocrinologist at [facility] He has follow-up scheduled with his PCP Dr. [name] in about 3 weeks and with his cardiologist Dr. Lovena Le next month.    Lab Results  Component Value Date   TSH 31.32* 02/03/2016   Lab Results  Component Value Date   HGBA1C 6.4 11/28/2015   Wt Readings from Last 3 Encounters:  03/11/16 257 lb 12.8 oz (116.937 kg)  03/09/16 256 lb 3.2 oz (116.212 kg)  03/07/16 267 lb (121.11 kg)   Patient Active Problem List   Diagnosis Date Noted  . Acute on chronic systolic HF (heart failure) ([place])   . History of gout 12/11/2014  . CAD (coronary artery disease)   . Chest pain 09/05/2014  . Pre-syncope 09/05/2014  . Dehydration  09/05/2014  . Ventricular tachycardia ([place])   . NICM (nonischemic cardiomyopathy) ([place])   . LBBB (left bundle branch block)   . Hypertension   . Hyperlipidemia   . Annual physical exam 01/23/2014  . Chronic systolic CHF (congestive heart failure) ([place]) 06/13/2012  . Thyroid cancer, multifocal papillary carcinoma 01/31/2012  . 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 GERD 07/14/2010  . ANEMIA, IRON DEFICIENCY 05/27/2010  . SHOULDER PAIN 05/26/2010  . ASTHMA 04/23/2010  . HOARSENESS, CHRONIC 02/11/2010  . DM II (diabetes mellitus, type II), controlled ([place]) 08/06/2009  . HYPOTHYROIDISM 12/21/2007  . CARPAL TUNNEL SYNDROME 05/02/2007    Past Medical History  Diagnosis Date  . Hyperlipidemia   . Hypertension   . Anemia   . NICM (nonischemic cardiomyopathy) ([place])     a. Dx 2011 - presented with sustained VT. Normal coronaries at that time, EF 10-15% by cath and 20-25% by echo. b. s/p biventricular ICD 2011. c. CP 08/2014: mild nonobstructive CAD.  Marland Kitchen Gastric ulcer 2011  . Ventricular tachycardia ([place])     a. Sustained VT 2011 s/p MDT [id number] Concert BiV ICD, ser# [id number].  . Postsurgical hypothyroidism   . Obesity   . Torn meniscus   . LBBB (left  bundle branch block)   . Automatic implantable cardioverter-defibrillator in situ   . CHF (congestive heart failure) ([place])   . Type II diabetes mellitus ([place])   . Childhood asthma   . GERD (gastroesophageal reflux disease)   . Arthritis     "knees" (09/05/2014)  . History of gout   . Thyroid cancer ([place])     a. 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 Dr Harlow Asa. s/p thyroidectomy 2013.  Marland Kitchen CAD (coronary artery disease)     a. Mild nonobstructive CAD by cath 08/2014.    Past Surgical History  Procedure Laterality Date  . Ptx      traumatic, age 7 months  . Vasectomy    . Bi-ventricular implantable cardioverter defibrillator  (crt-d)  03/2010  . Thyroidectomy  12/10/2011    Procedure:  TOTAL THYROIDECTOMY WITH CENTRAL COMPARTMENT DISSECTION;  Surgeon: Earnstine Regal, MD;  Location: [facility];  Service: General;  Laterality: N/A;  Total thyroidectomy with limited central compartment lymph node dissection.  . Cardiac catheterization  03/2010  . Left heart catheterization with coronary angiogram N/A 09/06/2014    Procedure: LEFT HEART CATHETERIZATION WITH CORONARY ANGIOGRAM;  Surgeon: Burnell Blanks, MD;  Location: [facility] CATH LAB;  Service: Cardiovascular;  Laterality: N/A;    Social History  Substance Use Topics  . Smoking status: Former Smoker -- 2.50 packs/day for 2 years    Types: Cigarettes    Quit date: 11/29/1970  . Smokeless tobacco: Former Systems developer    Types: Chew  . Alcohol Use: Yes     Comment: 09/05/2014 "2 drinks q couple months, if that    Family History  Problem Relation Age of Onset  . Diabetes Maternal Grandfather   . Diabetes Maternal Aunt   . Colon cancer Maternal Aunt   . Colon cancer Maternal Aunt   . Lung cancer Paternal Uncle   . Lung cancer Maternal Uncle   . Asthma Sister   . Atrial fibrillation Father   . Prostate cancer Neg Hx   . CAD Neg Hx   . Hypothyroidism Mother   . Hypothyroidism Daughter   . Hypertension Mother     alive @ 41.  Marland Kitchen Hypertension Father     alive @ 67.    Allergies  Allergen Reactions  . Losartan Other (See Comments)    Leg cramps  . Ramipril Cough    Medication list has been reviewed and updated.  Current Outpatient Prescriptions on File Prior to Visit  Medication Sig Dispense Refill  . acetaminophen (TYLENOL) 500 MG tablet Take 1,000 mg by mouth every 6 (six) hours as needed (pain).    Marland Kitchen allopurinol (ZYLOPRIM) 100 MG tablet Take 1 tablet (100 mg total) by mouth daily. 90 tablet 1  . aspirin EC 81 MG tablet Take 1 tablet (81 mg total) by mouth daily. (Patient taking differently: Take 81 mg by mouth at bedtime. )    . atorvastatin (LIPITOR) 20 MG tablet Take 20 mg by mouth daily.    . carvedilol (COREG) 6.25 MG  tablet Take 1 tablet (6.25 mg total) by mouth 2 (two) times daily with a meal. 180 tablet 3  . furosemide (LASIX) 40 MG tablet Take 2 tablets (40mg ) daily for 5 days, then 1 tablet (20mg ) daily. 30 tablet 9  . isosorbide-hydrALAZINE (BIDIL) 20-37.5 MG tablet Take 0.5 tablets by mouth 2 (two) times daily. 30 tablet 6  . levothyroxine (SYNTHROID, LEVOTHROID) 200 MCG tablet Take 200 mcg by mouth daily before breakfast.  5  .  metFORMIN (GLUCOPHAGE) 1000 MG tablet Take 1 tablet (1,000 mg total) by mouth 2 (two) times daily with a meal. 180 tablet 0  . Multiple Vitamin (MULTIVITAMIN WITH MINERALS) TABS tablet Take 1 tablet by mouth daily.    . Omega-3 Fatty Acids (FISH OIL) 1000 MG CAPS Take 1,000 mg by mouth 2 (two) times daily.     . potassium chloride (K-DUR) 10 MEQ tablet Take 2 tablets (42mEq) daily for 5 days, then one tablet (42mEq) daily. 30 tablet 6  . tadalafil (CIALIS) 20 MG tablet Take 20 mg by mouth daily as needed for erectile dysfunction.    . vitamin E 400 UNIT capsule Take 400 Units by mouth daily.    . [DISCONTINUED] calcium carbonate (OS-CAL - DOSED IN MG OF ELEMENTAL CALCIUM) 1250 MG tablet Take 2 tablets (1,000 mg of elemental calcium total) by mouth 3 (three) times daily. 50 tablet 0   No current facility-administered medications on file prior to visit.    Review of Systems:  As per HPI- otherwise negative.   Physical Examination: Filed Vitals:   03/11/16 1059  BP: 130/78  Pulse: 78  Temp: 98.2 F (36.8 C)   Filed Vitals:   03/11/16 1059  Height: 6\' 2"  (1.88 m)  Weight: 257 lb 12.8 oz (116.937 kg)   Body mass index is 33.09 kg/(m^2). Ideal Body Weight: Weight in (lb) to have BMI = 25: 194.3  GEN: WDWN, NAD, Non-toxic, A & O x 3, overweight, looks well HEENT: Atraumatic, Normocephalic. Neck supple. No masses, No LAD. Ears and Nose: No external deformity. CV: RRR, No M/G/R. No JVD. No thrill. No extra heart sounds. PULM: CTA B, no wheezes, crackles, rhonchi. No  retractions. No resp. distress. No accessory muscle use. ABD: S, NT, ND EXTR: No c/c/e  NEURO Normal gait.  PSYCH: Normally interactive. Conversant. Not depressed or anxious appearing.  Calm demeanor.    Assessment and Plan: Acute on chronic systolic congestive heart failure ([place])  Encounter for screening colonoscopy - Plan: Ambulatory referral to Gastroenterology  Hospital discharge follow-up  Here today for a recheck form his hospital discharge 2 days ago. He was admitted for 2 days with a mild CHF exacerbation. He is on increased lasix for 5 days and started bidil He had labs just 2 days ago so will not draw today He is supplementing his potassium He will watch for any signs of relapse- If you have any symptoms such as weight gain, shortness of breath, ankle swelling, inability to lie flat, muscle cramps or ankle swelling please seek help with [place] or your cardiologist  Otherwise he will continue to take his medications as directed and will seek care if any concerns He is due for a colonoscopy so did referral today for same   Signed Lamar Blinks, MD

## 2016-03-11 NOTE — Telephone Encounter (Signed)
Dr Lovena Le said Mr. Jory may return to work with no restrictions on 03/15/16.  Call with questions.  [phone]

## 2016-03-18 LAB — CUP PACEART REMOTE DEVICE CHECK
Brady Statistic AP VP Percent: 2.88 %
Brady Statistic AP VS Percent: 0.01 %
Brady Statistic AS VS Percent: 2.8 %
Brady Statistic RV Percent Paced: 97.19 %
Date Time Interrogation Session: 20170322082208
HighPow Impedance: 47 Ohm
HighPow Impedance: 59 Ohm
Implantable Lead Implant Date: 20110516
Implantable Lead Location: 753858
Implantable Lead Location: 753859
Implantable Lead Model: 1158
Lead Channel Impedance Value: 342 Ohm
Lead Channel Impedance Value: 418 Ohm
Lead Channel Impedance Value: 437 Ohm
Lead Channel Impedance Value: 589 Ohm
Lead Channel Pacing Threshold Pulse Width: 0.4 ms
Lead Channel Sensing Intrinsic Amplitude: 2.75 mV
Lead Channel Setting Pacing Amplitude: 2.5 V
Lead Channel Setting Pacing Pulse Width: 0.4 ms
Lead Channel Setting Sensing Sensitivity: 0.3 mV
MDC IDC LEAD IMPLANT DT: 20110516
MDC IDC LEAD IMPLANT DT: 20110516
MDC IDC LEAD LOCATION: 753860
MDC IDC MSMT BATTERY VOLTAGE: 2.61 V
MDC IDC MSMT LEADCHNL LV IMPEDANCE VALUE: 855 Ohm
MDC IDC MSMT LEADCHNL LV PACING THRESHOLD AMPLITUDE: 1.25 V
MDC IDC MSMT LEADCHNL RA SENSING INTR AMPL: 2.75 mV
MDC IDC MSMT LEADCHNL RV SENSING INTR AMPL: 14.875 mV
MDC IDC MSMT LEADCHNL RV SENSING INTR AMPL: 14.875 mV
MDC IDC SET LEADCHNL LV PACING AMPLITUDE: 2.75 V
MDC IDC SET LEADCHNL RA PACING AMPLITUDE: 2.5 V
MDC IDC SET LEADCHNL RV PACING PULSEWIDTH: 0.4 ms
MDC IDC STAT BRADY AS VP PERCENT: 94.31 %
MDC IDC STAT BRADY RA PERCENT PACED: 2.89 %

## 2016-03-18 LAB — HM DIABETES EYE EXAM

## 2016-03-19 ENCOUNTER — Encounter: Payer: Self-pay | Admitting: Cardiology

## 2016-03-19 ENCOUNTER — Telehealth: Payer: Self-pay | Admitting: Cardiology

## 2016-03-19 NOTE — Telephone Encounter (Signed)
LMOVM for pt to return my call.  

## 2016-03-19 NOTE — Telephone Encounter (Signed)
Spoke w/ pt and informed him that his battery has reached RRT and that a scheduler will be calling him to schedule and appt w/ MD / NP / PA to discuss the procedure and that the procedure will be scheduled for another day. Pt verbalized understanding.

## 2016-03-22 ENCOUNTER — Telehealth: Payer: Self-pay | Admitting: Cardiology

## 2016-03-22 NOTE — Telephone Encounter (Signed)
Spoke w/ pt and informed him that his 04/2016 remote transmission would be canceled since his device has reached ERI. Informed pt that his remote that is scheduled for 03-24-16 is w/ the [facility] nurse and it will be done from home w/ his home monitor. Explained to pt that a scheduler should call him about getting a sooner appointment w/ MD since he has reached ERI but he currently has a 04/20/2016 appt w/ MD. Pt verbalized understanding.

## 2016-03-24 ENCOUNTER — Ambulatory Visit (INDEPENDENT_AMBULATORY_CARE_PROVIDER_SITE_OTHER): Payer: 59

## 2016-03-24 DIAGNOSIS — Z95810 Presence of automatic (implantable) cardiac defibrillator: Secondary | ICD-10-CM | POA: Diagnosis not present

## 2016-03-24 DIAGNOSIS — I5022 Chronic systolic (congestive) heart failure: Secondary | ICD-10-CM | POA: Diagnosis not present

## 2016-03-24 NOTE — Progress Notes (Signed)
EPIC Encounter for ICM Monitoring  Patient Name: Mike Jimenez is a 65 y.o. male Date: 03/24/2016 Primary Care Physican: Kathlene November, MD Primary Cardiologist: Lovena Le Electrophysiologist: Lovena Le Dry Weight: unknown   Bi-V Pacing 81%      In the past month, have you:  1. Gained more than 2 pounds in a day or more than 5 pounds in a week? N/A  2. Had changes in your medications (with verification of current medications)? N/A  3. Had more shortness of breath than is usual for you? N/A  4. Limited your activity because of shortness of breath? N/A  5. Not been able to sleep because of shortness of breath? N/A  6. Had increased swelling in your feet or ankles? N/A  7. Had symptoms of dehydration (dizziness, dry mouth, increased thirst, decreased urine output) N/A  8. Had changes in sodium restriction? N/A  9. Been compliant with medication? N/A   ICM trend: 3 month view for 03/23/2016   ICM trend: 1 year view for 03/23/2016   Follow-up plan: ICM clinic phone appointment on 6/262017 and office appt on 04/20/2016.  Attempted call to patient and unable to reach.  Transmission reviewed.  Thoracic impedance below reference line from 02/19/2016 to 03/07/2016 suggesting fluid accumulation and returned to reference line 03/08/2016 suggesting fluid levels stabilized.   Per note, patient is aware device has reached ERI and appointment with Dr Lovena Le has been scheduled for 04/20/2016.     Rosalene Billings, RN, CCM 03/24/2016 11:16 AM

## 2016-03-25 ENCOUNTER — Telehealth: Payer: Self-pay | Admitting: Cardiology

## 2016-03-25 NOTE — Telephone Encounter (Signed)
LMOVM for pt to return call 

## 2016-03-26 NOTE — Telephone Encounter (Signed)
Spoke w/ pt and informed him that he could come in before his 03/2016 appt w/ MD to have to ERI alert turned off. Pt verbalized understanding and agreed to an appt w/ device clinic on 03-31-16 at 9:30 AM.

## 2016-03-29 ENCOUNTER — Encounter: Payer: Self-pay | Admitting: Internal Medicine

## 2016-03-29 ENCOUNTER — Ambulatory Visit (INDEPENDENT_AMBULATORY_CARE_PROVIDER_SITE_OTHER): Payer: 59 | Admitting: Internal Medicine

## 2016-03-29 VITALS — BP 132/74 | HR 66 | Temp 97.6°F | Resp 16 | Ht 74.0 in | Wt 259.6 lb

## 2016-03-29 DIAGNOSIS — E119 Type 2 diabetes mellitus without complications: Secondary | ICD-10-CM

## 2016-03-29 DIAGNOSIS — E785 Hyperlipidemia, unspecified: Secondary | ICD-10-CM

## 2016-03-29 DIAGNOSIS — I5022 Chronic systolic (congestive) heart failure: Secondary | ICD-10-CM

## 2016-03-29 DIAGNOSIS — Z09 Encounter for follow-up examination after completed treatment for conditions other than malignant neoplasm: Secondary | ICD-10-CM | POA: Insufficient documentation

## 2016-03-29 MED ORDER — POTASSIUM CHLORIDE ER 10 MEQ PO TBCR: 20.0000 meq | EXTENDED_RELEASE_TABLET | Freq: Every day | ORAL | Status: DC

## 2016-03-29 MED ORDER — FUROSEMIDE 20 MG PO TABS: 40.0000 mg | ORAL_TABLET | Freq: Every day | ORAL | Status: DC

## 2016-03-29 NOTE — Progress Notes (Signed)
Subjective:    Patient ID: Mike Jimenez, male    DOB: 1950/12/19, 65 y.o.   MRN: [id number]  DOS:  03/29/2016 Type of visit - description : Routine checkup Interval history:  Since the last time I saw him in December, he was admitted to the hospital  March 07 2016 with acute on chronic systolic CHF. Medication list reviewed, not taking the medications as prescribed. Diabetes: On metformin, ambulatory blood sugars in the 120s High cholesterol: Started Lipitor a few months ago, no apparent side effects. Thyroid cancer, just saw endocrinology, told is doing well  Wt Readings from Last 3 Encounters:  03/29/16 259 lb 9.6 oz (117.754 kg)  03/11/16 257 lb 12.8 oz (116.937 kg)  03/09/16 256 lb 3.2 oz (116.212 kg)      Review of Systems  Denies chest pain, difficulty breathing or lower extremity edema. No DOE, orthopnea. Admits to occasional cough, chest congestion with minimal amount of sputum production. no wheezing  Past Medical History  Diagnosis Date  . Hyperlipidemia   . Hypertension   . Anemia   . NICM (nonischemic cardiomyopathy) ([place])     a. Dx 2011 - presented with sustained VT. Normal coronaries at that time, EF 10-15% by cath and 20-25% by echo. b. s/p biventricular ICD 2011. c. CP 08/2014: mild nonobstructive CAD.  Marland Kitchen Gastric ulcer 2011  . Ventricular tachycardia ([place])     a. Sustained VT 2011 s/p MDT [id number] Concert BiV ICD, ser# [id number].  . Postsurgical hypothyroidism   . Obesity   . Torn meniscus   . LBBB (left bundle branch block)   . Automatic implantable cardioverter-defibrillator in situ   . CHF (congestive heart failure) ([place])   . Type II diabetes mellitus ([place])   . Childhood asthma   . GERD (gastroesophageal reflux disease)   . Arthritis     "knees" (09/05/2014)  . History of gout   . Thyroid cancer ([place])     a. 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 Dr Harlow Asa. s/p thyroidectomy 2013.  Marland Kitchen CAD (coronary artery disease)     a. Mild  nonobstructive CAD by cath 08/2014.    Past Surgical History  Procedure Laterality Date  . Ptx      traumatic, age 61 months  . Vasectomy    . Bi-ventricular implantable cardioverter defibrillator  (crt-d)  03/2010  . Thyroidectomy  12/10/2011    Procedure: TOTAL THYROIDECTOMY WITH CENTRAL COMPARTMENT DISSECTION;  Surgeon: Earnstine Regal, MD;  Location: [facility];  Service: General;  Laterality: N/A;  Total thyroidectomy with limited central compartment lymph node dissection.  . Cardiac catheterization  03/2010  . Left heart catheterization with coronary angiogram N/A 09/06/2014    Procedure: LEFT HEART CATHETERIZATION WITH CORONARY ANGIOGRAM;  Surgeon: Burnell Blanks, MD;  Location: [facility] CATH LAB;  Service: Cardiovascular;  Laterality: N/A;    Social History   Social History  . Marital Status: Married    Spouse Name: N/A  . Number of Children: 2  . Years of Education: N/A   Occupational History  . Warehouse work    Social History Main Topics  . Smoking status: Former Smoker -- 2.50 packs/day for 2 years    Types: Cigarettes    Quit date: 11/29/1970  . Smokeless tobacco: Former Systems developer    Types: Chew  . Alcohol Use: Yes     Comment: 09/05/2014 "2 drinks q couple months, if that  . Drug Use: No  . Sexual Activity: Yes   Other  Topics Concern  . Not on file   Social History Narrative   Live w/ wife , daughter lives w/ them - in [place]   7 dogs, fish pond.   Unloads trucks for a living.   Does not routinely exercise.        Medication List       This list is accurate as of: 03/29/16  7:48 PM.  Always use your most recent med list.               acetaminophen 500 MG tablet  Commonly known as:  TYLENOL  Take 1,000 mg by mouth every 6 (six) hours as needed (pain).     allopurinol 100 MG tablet  Commonly known as:  ZYLOPRIM  Take 1 tablet (100 mg total) by mouth daily.     aspirin EC 81 MG tablet  Take 1 tablet (81 mg total) by mouth daily.     atorvastatin 20 MG  tablet  Commonly known as:  LIPITOR  Take 20 mg by mouth daily.     carvedilol 6.25 MG tablet  Commonly known as:  COREG  Take 1 tablet (6.25 mg total) by mouth 2 (two) times daily with a meal.     Fish Oil 1000 MG Caps  Take 1,000 mg by mouth 2 (two) times daily.     furosemide 20 MG tablet  Commonly known as:  LASIX  Take 2 tablets (40 mg total) by mouth daily.     levothyroxine 200 MCG tablet  Commonly known as:  SYNTHROID, LEVOTHROID  Take 200 mcg by mouth daily before breakfast.     metFORMIN 1000 MG tablet  Commonly known as:  GLUCOPHAGE  Take 1 tablet (1,000 mg total) by mouth 2 (two) times daily with a meal.     multivitamin with minerals Tabs tablet  Take 1 tablet by mouth daily.     potassium chloride 10 MEQ tablet  Commonly known as:  K-DUR  Take 2 tablets (20 mEq total) by mouth daily.     tadalafil 20 MG tablet  Commonly known as:  CIALIS  Take 20 mg by mouth daily as needed for erectile dysfunction.     vitamin E 400 UNIT capsule  Take 400 Units by mouth daily.           Objective:   Physical Exam BP 132/74 mmHg  Pulse 66  Temp(Src) 97.6 F (36.4 C) (Oral)  Resp 16  Ht 6\' 2"  (1.88 m)  Wt 259 lb 9.6 oz (117.754 kg)  BMI 33.32 kg/m2  SpO2 97% General:   Well developed, well nourished . NAD.  HEENT:  Normocephalic . Face symmetric, atraumatic. Neck: No JVD at 45 Lungs:  Slightly decreased breath sounds at bases? Normal respiratory effort, no intercostal retractions, no accessory muscle use. Heart: RRR,  no murmur.  No pretibial edema bilaterally  Skin: Not pale. Not jaundice Neurologic:  alert & oriented X3.  Speech normal, gait appropriate for age and unassisted Psych--  Cognition and judgment appear intact.  Cooperative with normal attention span and concentration.  Behavior appropriate. No anxious or depressed appearing.      Assessment & Plan:   Assessment DM HTN Hyperlipidemia Hypothyroidism h/oThyroid cancer,  nodularity, no metastasis, thyroidectomy 2013 CV: --CHF/nonischemic cardiomyopathy dx 2011 --Ventricular tachycardia, 2011, normal coronaries, EF  ~15 -20 % -- s/p  biventricular ICD 2011 --CAD -Nonobstructive   by cath 08-2014 --LBBB  GI: --GERD --H/o anemia:  --Gastric ulcer per EGD 07-2010 --Colonoscopy 2011:4  polyps  H/o gout  PLAN DM: Continue metformin, check A1c Hyperlipidemia: Started Lipitor December 2016, check FLP. Recent LFTs normal. CHF:  S/p  recent admission, discharge weight was 256, now is  259. Self dc bidil dt cramps. Currently on Lasix 20 mg, not taking potassium (ran out). Although there is no overt s/s of vol overload, he is gaining weight. Plan: Increase Lasix to 40 mg, potassium to 20 mEq, OTC magnesium. BMP in one week. Self monitor weight. Has already an appointment to see cardiology. Hypothyroidism:   saw endo last week ago, told ok RTC 3 months

## 2016-03-29 NOTE — Patient Instructions (Addendum)
GO TO THE FRONT DESK  Schedule labs to be done 1 week from today, fasting: BMP, Mg CHF FLP high cholesterol A1c diabetes  Schedule your next appointment for a  routine checkup in 3 months, no fasting.    --------  Check the  blood pressure daily Be sure your blood pressure is between 110/65 and  145/85. If it is consistently higher or lower, let me know   Weight yourself every day, call us or cardiology if you increase more than 3 pounds in the next week.  Low salt diet

## 2016-03-29 NOTE — Assessment & Plan Note (Signed)
DM: Continue metformin, check A1c Hyperlipidemia: Started Lipitor December 2016, check FLP. Recent LFTs normal. CHF:  S/p  recent admission, discharge weight was 256, now is  259. Self dc bidil dt cramps. Currently on Lasix 20 mg, not taking potassium (ran out). Although there is no overt s/s of vol overload, he is gaining weight. Plan: Increase Lasix to 40 mg, potassium to 20 mEq, OTC magnesium. BMP in one week. Self monitor weight. Has already an appointment to see cardiology. Hypothyroidism:   saw endo last week ago, told ok RTC 3 months

## 2016-03-29 NOTE — Progress Notes (Signed)
Pre visit review using our clinic review tool, if applicable. No additional management support is needed unless otherwise documented below in the visit note. 

## 2016-03-31 ENCOUNTER — Ambulatory Visit (INDEPENDENT_AMBULATORY_CARE_PROVIDER_SITE_OTHER): Payer: 59 | Admitting: *Deleted

## 2016-03-31 ENCOUNTER — Encounter: Payer: Self-pay | Admitting: Internal Medicine

## 2016-03-31 DIAGNOSIS — I5022 Chronic systolic (congestive) heart failure: Secondary | ICD-10-CM

## 2016-03-31 DIAGNOSIS — Z95810 Presence of automatic (implantable) cardiac defibrillator: Secondary | ICD-10-CM | POA: Diagnosis not present

## 2016-03-31 LAB — CUP PACEART INCLINIC DEVICE CHECK
Battery Voltage: 2.62 V
Brady Statistic AP VP Percent: 1.75 %
Brady Statistic AP VS Percent: 0.02 %
Brady Statistic AS VS Percent: 6.28 %
Brady Statistic RV Percent Paced: 93.69 %
HighPow Impedance: 39 Ohm
HighPow Impedance: 53 Ohm
Implantable Lead Implant Date: 20110516
Implantable Lead Implant Date: 20110516
Implantable Lead Location: 753860
Implantable Lead Model: 1158
Implantable Lead Model: 5076
Lead Channel Impedance Value: 342 Ohm
Lead Channel Impedance Value: 551 Ohm
Lead Channel Pacing Threshold Amplitude: 1 V
Lead Channel Pacing Threshold Pulse Width: 0.4 ms
Lead Channel Pacing Threshold Pulse Width: 0.4 ms
Lead Channel Sensing Intrinsic Amplitude: 10.5 mV
Lead Channel Setting Pacing Amplitude: 2.5 V
Lead Channel Setting Pacing Amplitude: 2.5 V
Lead Channel Setting Pacing Amplitude: 2.75 V
Lead Channel Setting Pacing Pulse Width: 0.4 ms
Lead Channel Setting Pacing Pulse Width: 0.4 ms
Lead Channel Setting Sensing Sensitivity: 0.3 mV
MDC IDC LEAD IMPLANT DT: 20110516
MDC IDC LEAD LOCATION: 753858
MDC IDC LEAD LOCATION: 753859
MDC IDC MSMT LEADCHNL LV IMPEDANCE VALUE: 380 Ohm
MDC IDC MSMT LEADCHNL LV IMPEDANCE VALUE: 798 Ohm
MDC IDC MSMT LEADCHNL LV PACING THRESHOLD AMPLITUDE: 1 V
MDC IDC MSMT LEADCHNL RA IMPEDANCE VALUE: 418 Ohm
MDC IDC MSMT LEADCHNL RA PACING THRESHOLD AMPLITUDE: 1.25 V
MDC IDC MSMT LEADCHNL RA PACING THRESHOLD PULSEWIDTH: 0.4 ms
MDC IDC MSMT LEADCHNL RA SENSING INTR AMPL: 2.875 mV
MDC IDC SESS DTM: 20170503094853
MDC IDC STAT BRADY AS VP PERCENT: 91.94 %
MDC IDC STAT BRADY RA PERCENT PACED: 1.77 %

## 2016-03-31 NOTE — Progress Notes (Signed)
CRT-D device check in office to turn ERI alert tone off. ERI as of 03/19/16. Thresholds and sensing consistent with previous device measurements. Lead impedance trends stable over time. No mode switch episodes recorded. No ventricular arrhythmia episodes recorded. Patient bi-ventricularly pacing 84.2% of the time. 5.6% VSR pacing. 48 single PVCs per hour, 22.7 PVC runs per hour. Device programmed with appropriate safety margins. Heart failure diagnostics reviewed and trends are stable for patient. Audible alert for ERI turned off. No changes made this session. ROV with GT 04/20/16 to discuss generator replacement.

## 2016-04-04 ENCOUNTER — Other Ambulatory Visit: Payer: Self-pay | Admitting: Internal Medicine

## 2016-04-06 ENCOUNTER — Other Ambulatory Visit (INDEPENDENT_AMBULATORY_CARE_PROVIDER_SITE_OTHER): Payer: 59

## 2016-04-06 DIAGNOSIS — E785 Hyperlipidemia, unspecified: Secondary | ICD-10-CM

## 2016-04-06 DIAGNOSIS — I5022 Chronic systolic (congestive) heart failure: Secondary | ICD-10-CM

## 2016-04-06 DIAGNOSIS — E119 Type 2 diabetes mellitus without complications: Secondary | ICD-10-CM

## 2016-04-06 LAB — BASIC METABOLIC PANEL
BUN: 17 mg/dL (ref 6–23)
CALCIUM: 8.9 mg/dL (ref 8.4–10.5)
CO2: 32 mEq/L (ref 19–32)
CREATININE: 1 mg/dL (ref 0.40–1.50)
Chloride: 105 mEq/L (ref 96–112)
GFR: 79.91 mL/min (ref >60.00)
Glucose, Bld: 123 mg/dL — ABNORMAL HIGH (ref 70–99)
Potassium: 4.1 mEq/L (ref 3.5–5.1)
Sodium: 145 mEq/L (ref 135–145)

## 2016-04-06 LAB — LIPID PANEL
CHOL/HDL RATIO: 4
CHOLESTEROL: 146 mg/dL (ref 0–200)
HDL: 35.9 mg/dL — ABNORMAL LOW (ref >39.00)
LDL Cholesterol: 82 mg/dL (ref 0–99)
NonHDL: 110.32
Triglycerides: 141 mg/dL (ref 0.0–149.0)
VLDL: 28.2 mg/dL (ref 0.0–40.0)

## 2016-04-06 LAB — HEMOGLOBIN A1C: HEMOGLOBIN A1C: 7.3 % — AB (ref 4.6–6.5)

## 2016-04-06 LAB — MAGNESIUM: MAGNESIUM: 1.9 mg/dL (ref 1.5–2.5)

## 2016-04-20 ENCOUNTER — Ambulatory Visit (INDEPENDENT_AMBULATORY_CARE_PROVIDER_SITE_OTHER): Payer: 59 | Admitting: Internal Medicine

## 2016-04-20 ENCOUNTER — Encounter: Payer: Self-pay | Admitting: Internal Medicine

## 2016-04-20 VITALS — BP 132/78 | HR 69 | Ht 74.0 in | Wt 261.4 lb

## 2016-04-20 DIAGNOSIS — I472 Ventricular tachycardia, unspecified: Secondary | ICD-10-CM

## 2016-04-20 NOTE — Progress Notes (Signed)
HPI [name] returns today for followup. He is a pleasant 65 yo man with an non-ICM, chronic systolic CHF, and VT (sustained monomorphic). He is s/p BiV ICD implant. In the interim, he has had no chest pain or sob or ICD shock. No syncope. He was diagnosed Thyroid CA, and underwent surgery. He is on thyroid replacement. He has gone back to work. He has reached ERI on his device. He had been in the hospital with CHF for 3 days but is better now. He denies dietary indiscretion. Allergies  Allergen Reactions  . Losartan Other (See Comments)    Leg cramps  . Ramipril Cough     Current Outpatient Prescriptions  Medication Sig Dispense Refill  . acetaminophen (TYLENOL) 500 MG tablet Take 1,000 mg by mouth every 6 (six) hours as needed (pain).    Marland Kitchen allopurinol (ZYLOPRIM) 100 MG tablet Take 1 tablet (100 mg total) by mouth daily. 90 tablet 1  . aspirin EC 81 MG tablet Take 1 tablet (81 mg total) by mouth daily. (Patient taking differently: Take 81 mg by mouth at bedtime. )    . atorvastatin (LIPITOR) 20 MG tablet Take 20 mg by mouth daily.    . carvedilol (COREG) 6.25 MG tablet Take 1 tablet (6.25 mg total) by mouth 2 (two) times daily with a meal. 180 tablet 3  . furosemide (LASIX) 20 MG tablet Take 2 tablets (40 mg total) by mouth daily. 60 tablet 3  . levothyroxine (SYNTHROID, LEVOTHROID) 200 MCG tablet Take 200 mcg by mouth daily before breakfast.  5  . metFORMIN (GLUCOPHAGE) 1000 MG tablet Take 1 tablet (1,000 mg total) by mouth 2 (two) times daily with a meal. 180 tablet 0  . Multiple Vitamin (MULTIVITAMIN WITH MINERALS) TABS tablet Take 1 tablet by mouth daily.    . Omega-3 Fatty Acids (FISH OIL) 1000 MG CAPS Take 1,000 mg by mouth 2 (two) times daily.     . potassium chloride (K-DUR) 10 MEQ tablet Take 2 tablets (20 mEq total) by mouth daily. 60 tablet 3  . tadalafil (CIALIS) 20 MG tablet Take 20 mg by mouth daily as needed for erectile dysfunction.    . vitamin E 400 UNIT capsule Take 400  Units by mouth daily.    . [DISCONTINUED] calcium carbonate (OS-CAL - DOSED IN MG OF ELEMENTAL CALCIUM) 1250 MG tablet Take 2 tablets (1,000 mg of elemental calcium total) by mouth 3 (three) times daily. 50 tablet 0   No current facility-administered medications for this visit.     Past Medical History  Diagnosis Date  . Hyperlipidemia   . Hypertension   . Anemia   . NICM (nonischemic cardiomyopathy) ([place])     a. Dx 2011 - presented with sustained VT. Normal coronaries at that time, EF 10-15% by cath and 20-25% by echo. b. s/p biventricular ICD 2011. c. CP 08/2014: mild nonobstructive CAD.  Marland Kitchen Gastric ulcer 2011  . Ventricular tachycardia ([place])     a. Sustained VT 2011 s/p MDT [id number] Concert BiV ICD, ser# [id number] H.  . Postsurgical hypothyroidism   . Obesity   . Torn meniscus   . LBBB (left bundle branch block)   . Automatic implantable cardioverter-defibrillator in situ   . CHF (congestive heart failure) ([place])   . Type II diabetes mellitus ([place])   . Childhood asthma   . GERD (gastroesophageal reflux disease)   . Arthritis     "knees" (09/05/2014)  . History of gout   . Thyroid cancer ([place])  a. 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 Dr Harlow Asa. s/p thyroidectomy 2013.  Marland Kitchen CAD (coronary artery disease)     a. Mild nonobstructive CAD by cath 08/2014.    ROS:   All systems reviewed and negative except as noted in the HPI.   Past Surgical History  Procedure Laterality Date  . Ptx      traumatic, age 93 months  . Vasectomy    . Bi-ventricular implantable cardioverter defibrillator  (crt-d)  03/2010  . Thyroidectomy  12/10/2011    Procedure: TOTAL THYROIDECTOMY WITH CENTRAL COMPARTMENT DISSECTION;  Surgeon: Earnstine Regal, MD;  Location: [facility];  Service: General;  Laterality: N/A;  Total thyroidectomy with limited central compartment lymph node dissection.  . Cardiac catheterization  03/2010  . Left heart catheterization with coronary angiogram N/A 09/06/2014     Procedure: LEFT HEART CATHETERIZATION WITH CORONARY ANGIOGRAM;  Surgeon: Burnell Blanks, MD;  Location: [facility] CATH LAB;  Service: Cardiovascular;  Laterality: N/A;     Family History  Problem Relation Age of Onset  . Diabetes Maternal Grandfather   . Diabetes Maternal Aunt   . Colon cancer Maternal Aunt   . Colon cancer Maternal Aunt   . Lung cancer Paternal Uncle   . Lung cancer Maternal Uncle   . Asthma Sister   . Atrial fibrillation Father   . Prostate cancer Neg Hx   . CAD Neg Hx   . Hypothyroidism Mother   . Hypothyroidism Daughter   . Hypertension Mother     alive @ 21.  Marland Kitchen Hypertension Father     alive @ 63.     Social History   Social History  . Marital Status: Married    Spouse Name: N/A  . Number of Children: 2  . Years of Education: N/A   Occupational History  . Warehouse work    Social History Main Topics  . Smoking status: Former Smoker -- 2.50 packs/day for 2 years    Types: Cigarettes    Quit date: 11/29/1970  . Smokeless tobacco: Former Systems developer    Types: Chew  . Alcohol Use: Yes     Comment: 09/05/2014 "2 drinks q couple months, if that  . Drug Use: No  . Sexual Activity: Yes   Other Topics Concern  . Not on file   Social History Narrative   Live w/ wife , daughter lives w/ them - in [place]   7 dogs, fish pond.   Unloads trucks for a living.   Does not routinely exercise.     There were no vitals taken for this visit.  Physical Exam:  Well appearing obese middle aged man, NAD HEENT: Unremarkable Neck:  6 cm JVD, no thyromegally Lungs:  Clear with no wheezes, rales, or rhonchi. Well healed ICD incision. HEART:  Regular rate rhythm, no murmurs, no rubs, no clicks Abd:  soft, positive bowel sounds, no organomegally, no rebound, no guarding Ext:  2 plus pulses, no edema, no cyanosis, no clubbing Skin:  No rashes no nodules Neuro:  CN II through XII intact, motor grossly intact  ECG - nsr with p synchronous biventricular pacing  DEVICE   Normal device function.  See PaceArt for details. He has reached ERI.  Assess/Plan:  1. VT - he has had no recurrent episodes. No change in meds. 2. Chronic systolic heart failure - he is now euvolemic. We discussed the importance of a low sodium diet. 3. HTN - his blood pressure is minimally elevated. He does not  abuse sodium. 4. ICD - his device is working normally but has reached ERI. Will schedule gen change. Because of his history of sustained VT, and because his EF was 25% by echo 18 months ago, I will not obtain another echo because he will need an ICD regardless of the result with his myopathy and h/o sustained VT.  Mikle Bosworth.D.

## 2016-04-20 NOTE — Patient Instructions (Signed)
Medication Instructions:  Your physician recommends that you continue on your current medications as directed. Please refer to the Current Medication list given to you today.   Labwork: Your physician recommends that you return for lab work in: 05/14/16 at 10:00am You do have to be fasting for labs   Testing/Procedures: ICD generator change on 05/21/16 at 7:30am  Please be at [facility] at 5:30am on 05/21/16 Do not eat or drink after midnight the night prior to your procedure Do not take any medications the morning of your procedure   Follow-Up: Your physician recommends that you schedule a follow-up appointment in: 10-14 days from 05/21/16 in device clinic for wound check and 3 months from 05/21/16 with Dr Lovena Le   Any Other Special Instructions Will Be Listed Below (If Applicable).     If you need a refill on your cardiac medications before your next appointment, please call your pharmacy.

## 2016-05-14 ENCOUNTER — Other Ambulatory Visit (INDEPENDENT_AMBULATORY_CARE_PROVIDER_SITE_OTHER): Payer: 59 | Admitting: *Deleted

## 2016-05-14 DIAGNOSIS — I472 Ventricular tachycardia, unspecified: Secondary | ICD-10-CM

## 2016-05-14 LAB — CBC WITH DIFFERENTIAL/PLATELET
BASOS ABS: 0 {cells}/uL (ref 0–200)
Basophils Relative: 0 %
EOS ABS: 250 {cells}/uL (ref 15–500)
Eosinophils Relative: 5 %
HEMATOCRIT: 37.3 % — AB (ref 38.5–50.0)
Hemoglobin: 12.7 g/dL — ABNORMAL LOW (ref 13.2–17.1)
LYMPHS PCT: 29 %
Lymphs Abs: 1450 cells/uL (ref 850–3900)
MCH: 30.5 pg (ref 27.0–33.0)
MCHC: 34 g/dL (ref 32.0–36.0)
MCV: 89.7 fL (ref 80.0–100.0)
MONO ABS: 550 {cells}/uL (ref 200–950)
MPV: 9.5 fL (ref 7.5–12.5)
Monocytes Relative: 11 %
NEUTROS PCT: 55 %
Neutro Abs: 2750 cells/uL (ref 1500–7800)
Platelets: 229 10*3/uL (ref 140–400)
RBC: 4.16 MIL/uL — AB (ref 4.20–5.80)
RDW: 14 % (ref 11.0–15.0)
WBC: 5 10*3/uL (ref 3.8–10.8)

## 2016-05-14 LAB — BASIC METABOLIC PANEL
BUN: 21 mg/dL (ref 7–25)
CO2: 27 mmol/L (ref 20–31)
CREATININE: 1.29 mg/dL — AB (ref 0.70–1.25)
Calcium: 8.6 mg/dL (ref 8.6–10.3)
Chloride: 106 mmol/L (ref 98–110)
Glucose, Bld: 118 mg/dL — ABNORMAL HIGH (ref 65–99)
Potassium: 4 mmol/L (ref 3.5–5.3)
Sodium: 144 mmol/L (ref 135–146)

## 2016-05-14 NOTE — Addendum Note (Signed)
Addended by: Eulis Foster on: 05/14/2016 09:44 AM   Modules accepted: Orders

## 2016-05-21 ENCOUNTER — Other Ambulatory Visit: Payer: Self-pay

## 2016-05-21 ENCOUNTER — Encounter (HOSPITAL_COMMUNITY)
Admission: RE | Disposition: A | Discharge status: Home / Self Care | Payer: Self-pay | Source: Ambulatory Visit | Attending: Internal Medicine

## 2016-05-21 ENCOUNTER — Ambulatory Visit (HOSPITAL_COMMUNITY)
Admission: RE | Admit: 2016-05-21 | Discharge: 2016-05-21 | Disposition: A | Discharge status: Home / Self Care | Payer: 59 | Source: Ambulatory Visit | Attending: Internal Medicine | Admitting: Internal Medicine

## 2016-05-21 DIAGNOSIS — I472 Ventricular tachycardia: Secondary | ICD-10-CM | POA: Diagnosis not present

## 2016-05-21 DIAGNOSIS — Z6831 Body mass index (BMI) 31.0-31.9, adult: Secondary | ICD-10-CM | POA: Diagnosis not present

## 2016-05-21 DIAGNOSIS — M1990 Unspecified osteoarthritis, unspecified site: Secondary | ICD-10-CM | POA: Diagnosis not present

## 2016-05-21 DIAGNOSIS — Z7984 Long term (current) use of oral hypoglycemic drugs: Secondary | ICD-10-CM | POA: Insufficient documentation

## 2016-05-21 DIAGNOSIS — I447 Left bundle-branch block, unspecified: Secondary | ICD-10-CM | POA: Diagnosis not present

## 2016-05-21 DIAGNOSIS — D649 Anemia, unspecified: Secondary | ICD-10-CM | POA: Diagnosis not present

## 2016-05-21 DIAGNOSIS — I5022 Chronic systolic (congestive) heart failure: Secondary | ICD-10-CM | POA: Diagnosis not present

## 2016-05-21 DIAGNOSIS — Z8719 Personal history of other diseases of the digestive system: Secondary | ICD-10-CM | POA: Diagnosis not present

## 2016-05-21 DIAGNOSIS — Z7982 Long term (current) use of aspirin: Secondary | ICD-10-CM | POA: Diagnosis not present

## 2016-05-21 DIAGNOSIS — E890 Postprocedural hypothyroidism: Secondary | ICD-10-CM | POA: Insufficient documentation

## 2016-05-21 DIAGNOSIS — E119 Type 2 diabetes mellitus without complications: Secondary | ICD-10-CM | POA: Insufficient documentation

## 2016-05-21 DIAGNOSIS — E785 Hyperlipidemia, unspecified: Secondary | ICD-10-CM | POA: Insufficient documentation

## 2016-05-21 DIAGNOSIS — Z8249 Family history of ischemic heart disease and other diseases of the circulatory system: Secondary | ICD-10-CM | POA: Insufficient documentation

## 2016-05-21 DIAGNOSIS — I428 Other cardiomyopathies: Secondary | ICD-10-CM | POA: Diagnosis not present

## 2016-05-21 DIAGNOSIS — E669 Obesity, unspecified: Secondary | ICD-10-CM | POA: Diagnosis not present

## 2016-05-21 DIAGNOSIS — Z4502 Encounter for adjustment and management of automatic implantable cardiac defibrillator: Secondary | ICD-10-CM | POA: Diagnosis not present

## 2016-05-21 DIAGNOSIS — I110 Hypertensive heart disease with heart failure: Secondary | ICD-10-CM | POA: Diagnosis not present

## 2016-05-21 DIAGNOSIS — Z87891 Personal history of nicotine dependence: Secondary | ICD-10-CM | POA: Diagnosis not present

## 2016-05-21 DIAGNOSIS — Z95810 Presence of automatic (implantable) cardiac defibrillator: Secondary | ICD-10-CM | POA: Diagnosis present

## 2016-05-21 DIAGNOSIS — Z85850 Personal history of malignant neoplasm of thyroid: Secondary | ICD-10-CM | POA: Insufficient documentation

## 2016-05-21 DIAGNOSIS — I2510 Atherosclerotic heart disease of native coronary artery without angina pectoris: Secondary | ICD-10-CM | POA: Insufficient documentation

## 2016-05-21 DIAGNOSIS — K219 Gastro-esophageal reflux disease without esophagitis: Secondary | ICD-10-CM | POA: Diagnosis not present

## 2016-05-21 HISTORY — PX: EP IMPLANTABLE DEVICE: SHX172B

## 2016-05-21 LAB — GLUCOSE, CAPILLARY
GLUCOSE-CAPILLARY: 131 mg/dL — AB (ref 65–99)
Glucose-Capillary: 143 mg/dL — ABNORMAL HIGH (ref 65–99)

## 2016-05-21 LAB — SURGICAL PCR SCREEN
MRSA, PCR: NEGATIVE
Staphylococcus aureus: NEGATIVE

## 2016-05-21 SURGERY — ICD/BIV ICD GENERATOR CHANGEOUT

## 2016-05-21 MED ORDER — SODIUM CHLORIDE 0.9 % IR SOLN
Status: AC
  Filled 2016-05-21: qty 2

## 2016-05-21 MED ORDER — FENTANYL CITRATE (PF) 100 MCG/2ML IJ SOLN
INTRAMUSCULAR | Status: AC
  Filled 2016-05-21: qty 2

## 2016-05-21 MED ORDER — SODIUM CHLORIDE 0.9 % IR SOLN
80.0000 mg | Status: AC
  Administered 2016-05-21: 80 mg

## 2016-05-21 MED ORDER — LIDOCAINE HCL (PF) 1 % IJ SOLN
INTRAMUSCULAR | Status: DC | PRN
  Administered 2016-05-21: 15 mL

## 2016-05-21 MED ORDER — CEFAZOLIN SODIUM-DEXTROSE 2-3 GM-% IV SOLR
INTRAVENOUS | Status: DC | PRN
Start: 2016-05-21 — End: 2016-05-21
  Administered 2016-05-21: 2 g via INTRAVENOUS

## 2016-05-21 MED ORDER — MUPIROCIN 2 % EX OINT
1.0000 "application " | TOPICAL_OINTMENT | Freq: Once | CUTANEOUS | Status: AC
  Administered 2016-05-21: 1 via TOPICAL

## 2016-05-21 MED ORDER — ACETAMINOPHEN 325 MG PO TABS: 325.0000 mg | ORAL_TABLET | ORAL | Status: DC | PRN

## 2016-05-21 MED ORDER — LIDOCAINE HCL (PF) 1 % IJ SOLN
INTRAMUSCULAR | Status: AC
  Filled 2016-05-21: qty 60

## 2016-05-21 MED ORDER — SODIUM CHLORIDE 0.9 % IV SOLN
INTRAVENOUS | Status: DC
  Administered 2016-05-21: 06:00:00 via INTRAVENOUS

## 2016-05-21 MED ORDER — MIDAZOLAM HCL 5 MG/5ML IJ SOLN
INTRAMUSCULAR | Status: AC
  Filled 2016-05-21: qty 5

## 2016-05-21 MED ORDER — ONDANSETRON HCL 4 MG/2ML IJ SOLN: 4.0000 mg | Freq: Four times a day (QID) | INTRAMUSCULAR | Status: DC | PRN

## 2016-05-21 MED ORDER — MIDAZOLAM HCL 5 MG/5ML IJ SOLN
INTRAMUSCULAR | Status: DC | PRN
  Administered 2016-05-21: 2 mg via INTRAVENOUS

## 2016-05-21 MED ORDER — FENTANYL CITRATE (PF) 100 MCG/2ML IJ SOLN
INTRAMUSCULAR | Status: DC | PRN
  Administered 2016-05-21: 25 ug via INTRAVENOUS

## 2016-05-21 MED ORDER — CHLORHEXIDINE GLUCONATE 4 % EX LIQD: 60.0000 mL | Freq: Once | CUTANEOUS | Status: DC

## 2016-05-21 MED ORDER — MUPIROCIN 2 % EX OINT
TOPICAL_OINTMENT | CUTANEOUS | Status: AC
  Filled 2016-05-21: qty 22

## 2016-05-21 MED ORDER — CEFAZOLIN SODIUM-DEXTROSE 2-4 GM/100ML-% IV SOLN
INTRAVENOUS | Status: AC
  Filled 2016-05-21: qty 100

## 2016-05-21 MED ORDER — CEFAZOLIN SODIUM-DEXTROSE 2-4 GM/100ML-% IV SOLN: 2.0000 g | INTRAVENOUS | Status: DC

## 2016-05-21 SURGICAL SUPPLY — 4 items
CABLE SURGICAL S-101-97-12 (CABLE) ×2 IMPLANT
ICD VIVA XT CRT-D DTBA1D1 (ICD Generator) ×2 IMPLANT
PAD DEFIB LIFELINK (PAD) ×2 IMPLANT
TRAY PACEMAKER INSERTION (PACKS) ×2 IMPLANT

## 2016-05-21 NOTE — H&P (Signed)
HPI [name] returns today for followup. He is a pleasant 65 yo man with an non-ICM, chronic systolic CHF, and VT (sustained monomorphic). He is s/p BiV ICD implant. In the interim, he has had no chest pain or sob or ICD shock. No syncope. He was diagnosed Thyroid CA, and underwent surgery. He is on thyroid replacement. He has gone back to work. He has reached ERI on his device. He had been in the hospital with CHF for 3 days but is better now. He denies dietary indiscretion. Allergies  Allergen Reactions  . Losartan Other (See Comments)    Leg cramps  . Ramipril Cough     Current Outpatient Prescriptions  Medication Sig Dispense Refill  . acetaminophen (TYLENOL) 500 MG tablet Take 1,000 mg by mouth every 6 (six) hours as needed (pain).    Marland Kitchen allopurinol (ZYLOPRIM) 100 MG tablet Take 1 tablet (100 mg total) by mouth daily. 90 tablet 1  . aspirin EC 81 MG tablet Take 1 tablet (81 mg total) by mouth daily. (Patient taking differently: Take 81 mg by mouth at bedtime. )    . atorvastatin (LIPITOR) 20 MG tablet Take 20 mg by mouth daily.    . carvedilol (COREG) 6.25 MG tablet Take 1 tablet (6.25 mg total) by mouth 2 (two) times daily with a meal. 180 tablet 3  . furosemide (LASIX) 20 MG tablet Take 2 tablets (40 mg total) by mouth daily. 60 tablet 3  . levothyroxine (SYNTHROID, LEVOTHROID) 200 MCG tablet Take 200 mcg by mouth daily before breakfast.  5  . metFORMIN (GLUCOPHAGE) 1000 MG tablet Take 1 tablet (1,000 mg total) by mouth 2 (two) times daily with a meal. 180 tablet 0  . Multiple Vitamin (MULTIVITAMIN WITH MINERALS) TABS tablet Take 1 tablet by mouth daily.    . Omega-3 Fatty Acids (FISH OIL) 1000 MG CAPS Take 1,000 mg by mouth 2 (two) times daily.     . potassium chloride (K-DUR) 10 MEQ tablet Take 2 tablets (20 mEq total) by mouth daily. 60 tablet 3  . tadalafil (CIALIS) 20 MG tablet Take 20 mg by mouth daily  as needed for erectile dysfunction.    . vitamin E 400 UNIT capsule Take 400 Units by mouth daily.    . [DISCONTINUED] calcium carbonate (OS-CAL - DOSED IN MG OF ELEMENTAL CALCIUM) 1250 MG tablet Take 2 tablets (1,000 mg of elemental calcium total) by mouth 3 (three) times daily. 50 tablet 0   No current facility-administered medications for this visit.     Past Medical History  Diagnosis Date  . Hyperlipidemia   . Hypertension   . Anemia   . NICM (nonischemic cardiomyopathy) ([place])     a. Dx 2011 - presented with sustained VT. Normal coronaries at that time, EF 10-15% by cath and 20-25% by echo. b. s/p biventricular ICD 2011. c. CP 08/2014: mild nonobstructive CAD.  Marland Kitchen Gastric ulcer 2011  . Ventricular tachycardia ([place])     a. Sustained VT 2011 s/p MDT [id number] Concert BiV ICD, ser# [id number] H.  . Postsurgical hypothyroidism   . Obesity   . Torn meniscus   . LBBB (left bundle branch block)   . Automatic implantable cardioverter-defibrillator in situ   . CHF (congestive heart failure) ([place])   . Type II diabetes mellitus ([place])   . Childhood asthma   . GERD (gastroesophageal reflux disease)   . Arthritis     "knees" (09/05/2014)  . History of gout   . Thyroid cancer ([place])  a. 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 Dr Harlow Asa. s/p thyroidectomy 2013.  Marland Kitchen CAD (coronary artery disease)     a. Mild nonobstructive CAD by cath 08/2014.    ROS:  All systems reviewed and negative except as noted in the HPI.   Past Surgical History  Procedure Laterality Date  . Ptx      traumatic, age 14 months  . Vasectomy    . Bi-ventricular implantable cardioverter defibrillator (crt-d)  03/2010  . Thyroidectomy  12/10/2011    Procedure: TOTAL THYROIDECTOMY WITH CENTRAL COMPARTMENT DISSECTION; Surgeon: Earnstine Regal, MD; Location: [facility]; Service: General;  Laterality: N/A; Total thyroidectomy with limited central compartment lymph node dissection.  . Cardiac catheterization  03/2010  . Left heart catheterization with coronary angiogram N/A 09/06/2014    Procedure: LEFT HEART CATHETERIZATION WITH CORONARY ANGIOGRAM; Surgeon: Burnell Blanks, MD; Location: [facility] CATH LAB; Service: Cardiovascular; Laterality: N/A;     Family History  Problem Relation Age of Onset  . Diabetes Maternal Grandfather   . Diabetes Maternal Aunt   . Colon cancer Maternal Aunt   . Colon cancer Maternal Aunt   . Lung cancer Paternal Uncle   . Lung cancer Maternal Uncle   . Asthma Sister   . Atrial fibrillation Father   . Prostate cancer Neg Hx   . CAD Neg Hx   . Hypothyroidism Mother   . Hypothyroidism Daughter   . Hypertension Mother     alive @ 94.  Marland Kitchen Hypertension Father     alive @ 86.     Social History   Social History  . Marital Status: Married    Spouse Name: N/A  . Number of Children: 2  . Years of Education: N/A   Occupational History  . Warehouse work    Social History Main Topics  . Smoking status: Former Smoker -- 2.50 packs/day for 2 years    Types: Cigarettes    Quit date: 11/29/1970  . Smokeless tobacco: Former Systems developer    Types: Chew  . Alcohol Use: Yes     Comment: 09/05/2014 "2 drinks q couple months, if that  . Drug Use: No  . Sexual Activity: Yes   Other Topics Concern  . Not on file   Social History Narrative   Live w/ wife , daughter lives w/ them - in [place]   7 dogs, fish pond.   Unloads trucks for a living.   Does not routinely exercise.     There were no vitals taken for this visit.  Physical Exam:  Well appearing obese middle aged man, NAD HEENT: Unremarkable Neck: 6 cm JVD, no thyromegally Lungs: Clear with no wheezes, rales, or rhonchi. Well  healed ICD incision. HEART: Regular rate rhythm, no murmurs, no rubs, no clicks Abd: soft, positive bowel sounds, no organomegally, no rebound, no guarding Ext: 2 plus pulses, no edema, no cyanosis, no clubbing Skin: No rashes no nodules Neuro: CN II through XII intact, motor grossly intact  ECG - nsr with p synchronous biventricular pacing  DEVICE  Normal device function. See PaceArt for details. He has reached ERI.  Assess/Plan:  1. VT - he has had no recurrent episodes. No change in meds. 2. Chronic systolic heart failure - he is now euvolemic. We discussed the importance of a low sodium diet. 3. HTN - his blood pressure is minimally elevated. He does not abuse sodium. 4. ICD - his device is working normally but has reached ERI. Will schedule gen change. Because  of his history of sustained VT, and because his EF was 25% by echo 18 months ago, I will not obtain another echo because he will need an ICD regardless of the result with his myopathy and h/o sustained VT.  Mikle Bosworth.D.

## 2016-05-21 NOTE — Discharge Instructions (Signed)
Pacemaker Battery Change, Care After °Refer to this sheet in the next few weeks. These instructions provide you with information on caring for yourself after your procedure. Your health care provider may also give you more specific instructions. Your treatment has been planned according to current medical practices, but problems sometimes occur. Call your health care provider if you have any problems or questions after your procedure. °WHAT TO EXPECT AFTER THE PROCEDURE °After your procedure, it is typical to have the following sensations: °· Soreness at the pacemaker site. °HOME CARE INSTRUCTIONS  °· Keep the incision clean and dry. °· Do not get site wet for one week °· For the first week after the replacement, avoid stretching motions that pull at the incision site, and avoid heavy exercise with the arm that is on the same side as the incision. °· Take medicines only as directed by your health care provider. °· Keep all follow-up visits as directed by your health care provider. °SEEK MEDICAL CARE IF:  °· You have pain at the incision site that is not relieved by over-the-counter or prescription medicine. °· There is drainage or pus from the incision site. °· There is swelling larger than a lime at the incision site. °· You develop red streaking that extends above or below the incision site. °· You feel brief, intermittent palpitations, light-headedness, or any symptoms that you feel might be related to your heart. °SEEK IMMEDIATE MEDICAL CARE IF:  °· You experience chest pain that is different than the pain at the pacemaker site. °· You experience shortness of breath. °· You have palpitations or irregular heartbeat. °· You have light-headedness that does not go away quickly. °· You faint. °· You have pain that gets worse and is not relieved by medicine. °  °This information is not intended to replace advice given to you by your health care provider. Make sure you discuss any questions you have with your health  care provider. °  °Document Released: 09/05/2013 Document Revised: 12/06/2014 Document Reviewed: 09/05/2013 °Elsevier Interactive Patient Education ©2016 Elsevier Inc. ° °

## 2016-05-21 NOTE — H&P (Signed)
  ICD Criteria  Current LVEF:25%. Within 12 months prior to implant: No   Heart failure history: Yes, Class II  Cardiomyopathy history: Yes, Non-Ischemic Cardiomyopathy.  Atrial Fibrillation/Atrial Flutter: No.  Ventricular tachycardia history: Yes, Hemodynamic instability present. VT Type: Sustained Ventricular Tachycardia - Monomorphic.  Cardiac arrest history: No.  History of syndromes with risk of sudden death: No.  Previous ICD: Yes, Reason for ICD:  Secondary prevention.  Current ICD indication: Secondary  PPM indication: No.   Class I or II Bradycardia indication present: No  Beta Blocker therapy for 3 or more months: Yes, prescribed.   Ace Inhibitor/ARB therapy for 3 or more months: No, medical reason. Allergy to both ARB and ACE inhibitors

## 2016-05-21 NOTE — Progress Notes (Signed)
Given mupirocin and instruction sheet for positive staph pcr

## 2016-05-24 ENCOUNTER — Encounter (HOSPITAL_COMMUNITY): Payer: Self-pay | Admitting: Internal Medicine

## 2016-06-02 ENCOUNTER — Telehealth: Payer: Self-pay | Admitting: *Deleted

## 2016-06-02 NOTE — Telephone Encounter (Signed)
I spoke with patient to follow-up with 87-month phone call for [email or web address]  Study. Patient is doing well and has no hospitalization for congestive heart failure. Patient had to come and have his battery replaced in his defibrillator.I thanked patient for his participation in the study. Marland Kitchen

## 2016-06-03 ENCOUNTER — Encounter: Payer: Self-pay | Admitting: Internal Medicine

## 2016-06-03 ENCOUNTER — Ambulatory Visit (INDEPENDENT_AMBULATORY_CARE_PROVIDER_SITE_OTHER): Payer: 59 | Admitting: *Deleted

## 2016-06-03 DIAGNOSIS — I429 Cardiomyopathy, unspecified: Secondary | ICD-10-CM

## 2016-06-03 DIAGNOSIS — I428 Other cardiomyopathies: Secondary | ICD-10-CM

## 2016-06-03 LAB — CUP PACEART INCLINIC DEVICE CHECK
Battery Voltage: 3.15 V
Brady Statistic AP VP Percent: 5.66 %
Brady Statistic AS VP Percent: 86.59 %
Brady Statistic RV Percent Paced: 42.18 %
HIGH POWER IMPEDANCE MEASURED VALUE: 39 Ohm
HighPow Impedance: 54 Ohm
Implantable Lead Implant Date: 20110516
Implantable Lead Location: 753858
Implantable Lead Location: 753859
Implantable Lead Location: 753860
Implantable Lead Model: 5076
Implantable Lead Model: 6947
Lead Channel Impedance Value: 285 Ohm
Lead Channel Impedance Value: 342 Ohm
Lead Channel Impedance Value: 399 Ohm
Lead Channel Impedance Value: 722 Ohm
Lead Channel Pacing Threshold Amplitude: 1 V
Lead Channel Pacing Threshold Amplitude: 1.125 V
Lead Channel Pacing Threshold Pulse Width: 0.4 ms
Lead Channel Pacing Threshold Pulse Width: 0.4 ms
Lead Channel Sensing Intrinsic Amplitude: 10.5 mV
Lead Channel Sensing Intrinsic Amplitude: 2.875 mV
Lead Channel Setting Pacing Amplitude: 2.25 V
Lead Channel Setting Pacing Amplitude: 2.5 V
Lead Channel Setting Pacing Pulse Width: 0.4 ms
Lead Channel Setting Sensing Sensitivity: 0.3 mV
MDC IDC LEAD IMPLANT DT: 20110516
MDC IDC LEAD IMPLANT DT: 20110516
MDC IDC LEAD MODEL: 1158
MDC IDC MSMT BATTERY REMAINING LONGEVITY: 112 mo
MDC IDC MSMT LEADCHNL LV IMPEDANCE VALUE: 494 Ohm
MDC IDC MSMT LEADCHNL LV PACING THRESHOLD PULSEWIDTH: 0.4 ms
MDC IDC MSMT LEADCHNL RA SENSING INTR AMPL: 2.75 mV
MDC IDC MSMT LEADCHNL RV IMPEDANCE VALUE: 323 Ohm
MDC IDC MSMT LEADCHNL RV PACING THRESHOLD AMPLITUDE: 1 V
MDC IDC MSMT LEADCHNL RV SENSING INTR AMPL: 11 mV
MDC IDC SESS DTM: 20170706102751
MDC IDC SET LEADCHNL LV PACING AMPLITUDE: 2 V
MDC IDC SET LEADCHNL RV PACING PULSEWIDTH: 0.4 ms
MDC IDC STAT BRADY AP VS PERCENT: 0.27 %
MDC IDC STAT BRADY AS VS PERCENT: 7.48 %
MDC IDC STAT BRADY RA PERCENT PACED: 5.93 %

## 2016-06-03 NOTE — Progress Notes (Signed)
CRT-D Wound check in office. Steri-Strips removed. Wound well healed without redness, swelling, drainage. Thresholds and sensing consistent with previous device measurements. Lead impedance trends stable over time. No mode switch episodes recorded. No ventricular arrhythmia episodes recorded. Patient bi-ventricularly pacing 79%, 6.5% VS response. PVC Single 103.1 per hour, pt asymptomatic. Device programmed with appropriate safety margins. Pts home monitor alert time changed to 10am. Estimated longevity 9.8yrs.  Patient enrolled in remote follow up. ICM remote 06/30/2016. ROV with GT 08/24/2016 Patient educated about wound care and home monitoring.

## 2016-06-29 ENCOUNTER — Encounter: Payer: Self-pay | Admitting: Internal Medicine

## 2016-06-29 ENCOUNTER — Ambulatory Visit (INDEPENDENT_AMBULATORY_CARE_PROVIDER_SITE_OTHER): Payer: 59 | Admitting: Internal Medicine

## 2016-06-29 VITALS — BP 130/72 | HR 34 | Temp 97.8°F | Resp 14 | Ht 74.0 in | Wt 261.0 lb

## 2016-06-29 DIAGNOSIS — E118 Type 2 diabetes mellitus with unspecified complications: Secondary | ICD-10-CM | POA: Diagnosis not present

## 2016-06-29 DIAGNOSIS — Z09 Encounter for follow-up examination after completed treatment for conditions other than malignant neoplasm: Secondary | ICD-10-CM | POA: Diagnosis not present

## 2016-06-29 DIAGNOSIS — I509 Heart failure, unspecified: Secondary | ICD-10-CM | POA: Diagnosis not present

## 2016-06-29 LAB — BASIC METABOLIC PANEL
BUN: 24 mg/dL — AB (ref 6–23)
CO2: 28 mEq/L (ref 19–32)
CREATININE: 1.33 mg/dL (ref 0.40–1.50)
Calcium: 9.4 mg/dL (ref 8.4–10.5)
Chloride: 102 mEq/L (ref 96–112)
GFR: 57.46 mL/min — AB (ref >60.00)
GLUCOSE: 140 mg/dL — AB (ref 70–99)
POTASSIUM: 4.3 meq/L (ref 3.5–5.1)
Sodium: 141 mEq/L (ref 135–145)

## 2016-06-29 LAB — HEMOGLOBIN A1C: Hgb A1c MFr Bld: 7.3 % — ABNORMAL HIGH (ref 4.6–6.5)

## 2016-06-29 NOTE — Progress Notes (Signed)
Pre visit review using our clinic review tool, if applicable. No additional management support is needed unless otherwise documented below in the visit note. 

## 2016-06-29 NOTE — Progress Notes (Addendum)
Subjective:    Patient ID: Mike Jimenez, male    DOB: 12-21-1950, 65 y.o.   MRN: [id number]  DOS:  06/29/2016 Type of visit - description : rov DM: Good compliance with metformin, ambulatory CBGs in the 120s CHF: Weight at home ( naked)  is around 245-250  Has a pacemaker, no recent shocks Thyroid disease: Follow-up by endocrinology.  Interval history: Denies chest pain, difficulty breathing or palpitations No syncope or orthostatic dizziness No nausea, vomiting, diarrhea  Wt Readings from Last 3 Encounters:  06/29/16 261 lb (118.4 kg)  05/21/16 245 lb (111.1 kg)  04/20/16 261 lb 6.4 oz (118.6 kg)    Review of Systems   Past Medical History:  Diagnosis Date  . Anemia   . Arthritis    "knees" (09/05/2014)  . Automatic implantable cardioverter-defibrillator in situ   . CAD (coronary artery disease)    a. Mild nonobstructive CAD by cath 08/2014.  Marland Kitchen CHF (congestive heart failure) ([place])   . Childhood asthma   . Gastric ulcer 2011  . GERD (gastroesophageal reflux disease)   . History of gout   . Hyperlipidemia   . Hypertension   . LBBB (left bundle branch block)   . NICM (nonischemic cardiomyopathy) ([place])    a. Dx 2011 - presented with sustained VT. Normal coronaries at that time, EF 10-15% by cath and 20-25% by echo. b. s/p biventricular ICD 2011. c. CP 08/2014: mild nonobstructive CAD.  Marland Kitchen Obesity   . Postsurgical hypothyroidism   . Thyroid cancer ([place])    a. 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 Dr Harlow Asa. s/p thyroidectomy 2013.  . Torn meniscus   . Type II diabetes mellitus ([place])   . Ventricular tachycardia ([place])    a. Sustained VT 2011 s/p MDT [id number] Concert BiV ICD, ser# [id number].    Past Surgical History:  Procedure Laterality Date  . BI-VENTRICULAR IMPLANTABLE CARDIOVERTER DEFIBRILLATOR  (CRT-D)  03/2010  . CARDIAC CATHETERIZATION  03/2010  . EP IMPLANTABLE DEVICE N/A 05/21/2016   Procedure:  ICD Generator Changeout;  Surgeon: Evans Lance,  MD;  Location: [place] [facility];  Service: Cardiovascular;  Laterality: N/A;  . LEFT HEART CATHETERIZATION WITH CORONARY ANGIOGRAM N/A 09/06/2014   Procedure: LEFT HEART CATHETERIZATION WITH CORONARY ANGIOGRAM;  Surgeon: Burnell Blanks, MD;  Location: [facility] CATH LAB;  Service: Cardiovascular;  Laterality: N/A;  . PTX     traumatic, age 62 months  . THYROIDECTOMY  12/10/2011   Procedure: TOTAL THYROIDECTOMY WITH CENTRAL COMPARTMENT DISSECTION;  Surgeon: Earnstine Regal, MD;  Location: [place];  Service: General;  Laterality: N/A;  Total thyroidectomy with limited central compartment lymph node dissection.  Marland Kitchen VASECTOMY      Social History   Social History  . Marital status: Married    Spouse name: N/A  . Number of children: 2  . Years of education: N/A   Occupational History  . Warehouse work Medical laboratory scientific officer   Social History Main Topics  . Smoking status: Former Smoker    Packs/day: 2.50    Years: 2.00    Types: Cigarettes    Quit date: 11/29/1970  . Smokeless tobacco: Former Systems developer    Types: Chew  . Alcohol use Yes     Comment: 09/05/2014 "2 drinks q couple months, if that  . Drug use: No  . Sexual activity: Yes   Other Topics Concern  . Not on file   Social History Narrative   Live w/ wife , daughter lives w/  them - in [place]   7 dogs, fish pond.   Unloads trucks for a living.   Does not routinely exercise.        Medication List       Accurate as of 06/29/16  5:33 PM. Always use your most recent med list.          acetaminophen 500 MG tablet Commonly known as:  TYLENOL Take 1,000 mg by mouth every 6 (six) hours as needed (pain).   allopurinol 100 MG tablet Commonly known as:  ZYLOPRIM Take 1 tablet (100 mg total) by mouth daily.   aspirin EC 81 MG tablet Take 1 tablet (81 mg total) by mouth daily.   atorvastatin 20 MG tablet Commonly known as:  LIPITOR Take 40 mg by mouth daily.   carvedilol 6.25 MG tablet Commonly known as:  COREG Take 1 tablet (6.25 mg total) by  mouth 2 (two) times daily with a meal.   Fish Oil 1000 MG Caps Take 1,000 mg by mouth 2 (two) times daily.   furosemide 20 MG tablet Commonly known as:  LASIX Take 2 tablets (40 mg total) by mouth daily.   levothyroxine 200 MCG tablet Commonly known as:  SYNTHROID, LEVOTHROID Take 200 mcg by mouth daily before breakfast.   MAGNESIUM PO Take 1 tablet by mouth daily.   metFORMIN 1000 MG tablet Commonly known as:  GLUCOPHAGE Take 1 tablet (1,000 mg total) by mouth 2 (two) times daily with a meal.   multivitamin with minerals Tabs tablet Take 1 tablet by mouth daily.   potassium chloride 10 MEQ tablet Commonly known as:  K-DUR Take 2 tablets (20 mEq total) by mouth daily.   tadalafil 20 MG tablet Commonly known as:  CIALIS Take 20 mg by mouth daily as needed for erectile dysfunction.   vitamin E 400 UNIT capsule Take 400 Units by mouth daily.          Objective:   Physical Exam BP 130/72 (BP Location: Left Arm, Patient Position: Sitting, Cuff Size: Normal)   Pulse (!) 34   Temp 97.8 F (36.6 C) (Oral)   Resp 14   Ht 6\' 2"  (1.88 m)   Wt 261 lb (118.4 kg)   SpO2 95%   BMI 33.51 kg/m  General:   Well developed, well nourished . NAD.  HEENT:  Normocephalic . Face symmetric, atraumatic Lungs:  CTA B Normal respiratory effort, no intercostal retractions, no accessory muscle use. Heart: Bradycardic,  no murmur.  no pretibial edema bilaterally  Abdomen:  Not distended, soft, non-tender. No rebound or rigidity.  Skin: Not pale. Not jaundice Neurologic:  alert & oriented X3.  Speech normal, gait appropriate for age and unassisted Psych--  Cognition and judgment appear intact.  Cooperative with normal attention span and concentration.  Behavior appropriate. No anxious or depressed appearing.    Assessment & Plan:     Assessment DM HTN Hyperlipidemia Hypothyroidism h/oThyroid cancer, nodularity, no mets, thyroidectomy 2013- sees Dr Buddy Duty yearly as off  05-2016  CV: --CHF/nonischemic cardiomyopathy dx 2011 --Ventricular tachycardia, 2011, normal coronaries, EF  ~15 -20 % -- s/p  biventricular ICD 2011--- replaced 05-21-26 --CAD -Nonobstructive   by cath 08-2014 --LBBB  GI: --GERD --H/o anemia:  --Gastric ulcer per EGD 07-2010 --Colonoscopy 2011:4 polyps  H/o gout  PLAN DM: Continue metformin, check A1c. Doing better with diet and exercise lately. Hypothyroidism: Last TSH was 31, follow-up by endocrinology. CHF: No evidence of vol overload. Weight at this office was 259, today  261. Recommend no change, watch salt intake. V. tach: Noted to be bradycardic, EKG paced, will discuss with cardiology. Addendum, EKG discussed informally with cardiology, he is in bigeminy, needs to see Dr. Lovena Le within 2-3 weeks. Will contact Dr. Lovena Le nurse, arrange a f/u and notify the patient. RTC 4 months, CPX

## 2016-06-29 NOTE — Patient Instructions (Signed)
GO TO THE LAB : Get the blood work     GO TO THE FRONT DESK Schedule your next appointment for a physical exam by December 2017

## 2016-06-29 NOTE — Assessment & Plan Note (Addendum)
DM: Continue metformin, check A1c. Doing better with diet and exercise lately. Hypothyroidism: Last TSH was 31, follow-up by endocrinology. CHF: No evidence of vol overload. Weight at this office was 259, today 261. Recommend no change, watch salt intake. V. tach: Noted to be bradycardic, EKG paced, will discuss with cardiology. Addendum, EKG discussed informally with cardiology, he is in bigeminy, needs to see Dr. Lovena Le within 2-3 weeks. Will contact Dr. Lovena Le nurse, arrange a f/u and notify the patient. RTC 4 months, CPX

## 2016-06-30 ENCOUNTER — Telehealth: Payer: Self-pay

## 2016-06-30 ENCOUNTER — Ambulatory Visit (INDEPENDENT_AMBULATORY_CARE_PROVIDER_SITE_OTHER): Payer: 59

## 2016-06-30 ENCOUNTER — Telehealth: Payer: Self-pay | Admitting: Cardiology

## 2016-06-30 DIAGNOSIS — Z95810 Presence of automatic (implantable) cardiac defibrillator: Secondary | ICD-10-CM | POA: Diagnosis not present

## 2016-06-30 DIAGNOSIS — I5022 Chronic systolic (congestive) heart failure: Secondary | ICD-10-CM

## 2016-06-30 NOTE — Telephone Encounter (Signed)
-----   Message from Colon Branch, MD sent at 06/30/2016  1:07 PM EDT ----- Call Dr. Tanna Furry nurse, needs to be seen within 2 or 3 weeks, also notify the patient about it.

## 2016-06-30 NOTE — Telephone Encounter (Signed)
Tried calling cardiology, on hold for > 15 minutes, unable to hold any longer. LMOM informing Pt to return call at his earliest convenience.

## 2016-06-30 NOTE — Telephone Encounter (Signed)
Spoke with pt and reminded pt of remote transmission that is due today. Pt verbalized understanding.   

## 2016-07-01 MED ORDER — SITAGLIPTIN PHOSPHATE 100 MG PO TABS: 100.0000 mg | ORAL_TABLET | Freq: Every day | ORAL | 5 refills | Status: DC

## 2016-07-01 NOTE — Addendum Note (Signed)
Addended byDamita Dunnings D on: 07/01/2016 09:10 AM   Modules accepted: Orders

## 2016-07-01 NOTE — Progress Notes (Signed)
EPIC Encounter for ICM Monitoring  Patient Name: Mike Jimenez is a 65 y.o. male Date: 07/01/2016 Primary Care Physican: Kathlene November, MD Primary Cardiologist: Lovena Le Electrophysiologist: Lovena Le Dry Weight: unknown Bi-V Pacing:  82.7%       Heart Failure questions reviewed, pt asymptomatic   Thoracic impedance normal.  Recommendations: No changes.  Patient had PCP visit yesterday and Dr [name] saw abnormality on EKG which was reviewed by Dr Curt Bears on 06/30/2016.  Recommended patient have an appointment with Dr Lovena Le in the next 2-3 weeks instead of the late September appointment.  Message sent to scheduler to call patient to make an appointment.     ICM trend: 07/01/2016     Follow-up plan: ICM clinic phone appointment on 08/04/2016.  Copy of ICM check sent to device physician.   Rosalene Billings, RN 07/01/2016 1:45 PM

## 2016-07-01 NOTE — Telephone Encounter (Signed)
Spoke w/ Mike Jimenez, informed him that Dr. Lovena Le is out of the office for the next two weeks, Dr. Curt Bears reviewed EKG and recommended he see Dr. Lovena Le in 2-3 weeks instead of waiting until the end of September. Mike Jimenez verbalized he would call cardiology to schedule an appt w/ Dr. Lovena Le.

## 2016-07-07 ENCOUNTER — Encounter: Payer: Self-pay | Admitting: Internal Medicine

## 2016-07-14 ENCOUNTER — Encounter: Payer: 59 | Admitting: Internal Medicine

## 2016-07-16 ENCOUNTER — Encounter: Payer: Self-pay | Admitting: Physician Assistant

## 2016-07-18 NOTE — Progress Notes (Signed)
Cardiology Office Note Date:  07/19/2016  Patient ID:  Gilman, Cantey 10-22-1951, MRN [id number] PCP:  Kathlene November, MD  Cardiologist:  Dr. Lovena Le    Chief Complaint: abnormal EKG, V bigemeny noted at PMD office  History of Present Illness: MARCOS EDWARDSON is a 65 y.o. male with history of NICm w/i ICD, CHF (systolic) exacerbation April 2017, thyroid ca s/p surgery, HTN, DM, HLD, obesity, asthma, comes to the office today to be seen by Dr. Lovena Le, last seen by him in May, at that time doing well, noting his device was at [facility] and underwent gen change 05/21/16.  The patient was apparently seen by his PMD who was concerned about his EKG (noting V. bigemeny, this was reviewed by dr. Curt Bears who suggested his planned OV be moved up, and comes in today to be seen.  He is feeling well, states his PMD office checked his vitals while there for a routine visit and told him his HR was in the 40's and needed to come in.  He is feeling well, denies any kind of CP, palpitations or SOB, no dizziness, near syncope or syncope.  Denies symptoms of PND or orthopnea.  Reports his exertional capacity at it's baseline.  The patient mentions he feels like this device is more pronounced, or he is more aware of it then the last.    Device information: MDT CRT-D, gen change 05/21/16, original implant 04/13/10, primary prevention  The patient states he has never been shocked  Past Medical History:  Diagnosis Date  . Anemia   . Arthritis    "knees" (09/05/2014)  . Automatic implantable cardioverter-defibrillator in situ   . CAD (coronary artery disease)    a. Mild nonobstructive CAD by cath 08/2014.  Marland Kitchen CHF (congestive heart failure) ([place])   . Childhood asthma   . Gastric ulcer 2011  . GERD (gastroesophageal reflux disease)   . History of gout   . Hyperlipidemia   . Hypertension   . LBBB (left bundle branch block)   . NICM (nonischemic cardiomyopathy) ([place])    a. Dx 2011 - presented with sustained VT. Normal  coronaries at that time, EF 10-15% by cath and 20-25% by echo. b. s/p biventricular ICD 2011. c. CP 08/2014: mild nonobstructive CAD.  Marland Kitchen Obesity   . Postsurgical hypothyroidism   . Thyroid cancer ([place])    a. 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 Dr Harlow Asa. s/p thyroidectomy 2013.  . Torn meniscus   . Type II diabetes mellitus ([place])   . Ventricular tachycardia ([place])    a. Sustained VT 2011 s/p MDT [id number] Concert BiV ICD, ser# [id number].    Past Surgical History:  Procedure Laterality Date  . BI-VENTRICULAR IMPLANTABLE CARDIOVERTER DEFIBRILLATOR  (CRT-D)  03/2010  . CARDIAC CATHETERIZATION  03/2010  . EP IMPLANTABLE DEVICE N/A 05/21/2016   Procedure:  ICD Generator Changeout;  Surgeon: Evans Lance, MD;  Location: [place] CV [facility];  Service: Cardiovascular;  Laterality: N/A;  . LEFT HEART CATHETERIZATION WITH CORONARY ANGIOGRAM N/A 09/06/2014   Procedure: LEFT HEART CATHETERIZATION WITH CORONARY ANGIOGRAM;  Surgeon: Burnell Blanks, MD;  Location[facility] C[facility] LAB;  Service: Cardiovascular;  Laterality: N/A;  . PTX     traumatic, age 70 months  . THYROIDECTOMY  12/10/2011   Procedure: TOTAL THYROIDECTOMY WITH CENTRAL COMPARTMENT DISSECTION;  Surgeon: Earnstine Regal, MD;  Location: [place];  Service: General;  Laterality: N/A;  Total thyroidectomy with limited central compartment lymph node dissection.  Marland Kitchen  VASECTOMY      Current Outpatient Prescriptions  Medication Sig Dispense Refill  . acetaminophen (TYLENOL) 500 MG tablet Take 1,000 mg by mouth every 6 (six) hours as needed (pain).    Marland Kitchen allopurinol (ZYLOPRIM) 100 MG tablet Take 1 tablet (100 mg total) by mouth daily. 90 tablet 1  . aspirin EC 81 MG tablet Take 1 tablet (81 mg total) by mouth daily.    Marland Kitchen atorvastatin (LIPITOR) 20 MG tablet Take 40 mg by mouth daily.    . carvedilol (COREG) 12.5 MG tablet Take 1 tablet (12.5 mg total) by mouth 2 (two) times daily with a meal. 180 tablet 5  . furosemide (LASIX) 20 MG  tablet Take 2 tablets (40 mg total) by mouth daily. 60 tablet 3  . levothyroxine (SYNTHROID, LEVOTHROID) 200 MCG tablet Take 200 mcg by mouth daily before breakfast.  5  . MAGNESIUM PO Take 1 tablet by mouth daily.    . metFORMIN (GLUCOPHAGE) 1000 MG tablet Take 1 tablet (1,000 mg total) by mouth 2 (two) times daily with a meal. 180 tablet 0  . Multiple Vitamin (MULTIVITAMIN WITH MINERALS) TABS tablet Take 1 tablet by mouth daily.    . Omega-3 Fatty Acids (FISH OIL) 1000 MG CAPS Take 1,000 mg by mouth 2 (two) times daily.     . potassium chloride (K-DUR) 10 MEQ tablet Take 2 tablets (20 mEq total) by mouth daily. 60 tablet 3  . sitaGLIPtin (JANUVIA) 100 MG tablet Take 1 tablet (100 mg total) by mouth daily. 30 tablet 5  . tadalafil (CIALIS) 20 MG tablet Take 20 mg by mouth daily as needed for erectile dysfunction.    . vitamin E 400 UNIT capsule Take 400 Units by mouth daily.     No current facility-administered medications for this visit.     Allergies:   Losartan and Ramipril   Social History:  The patient  reports that he quit smoking about 45 years ago. His smoking use included Cigarettes. He has a 5.00 pack-year smoking history. He has quit using smokeless tobacco. His smokeless tobacco use included Chew. He reports that he drinks alcohol. He reports that he does not use drugs.   Family History:  The patient's family history includes Asthma in his sister; Atrial fibrillation in his father; Colon cancer in his maternal aunt and maternal aunt; Diabetes in his maternal aunt and maternal grandfather; Hypertension in his father and mother; Hypothyroidism in his daughter and mother; Lung cancer in his maternal uncle and paternal uncle.  ROS:  Please see the history of present illness.   All other systems are reviewed and otherwise negative.   PHYSICAL EXAM:  VS:  BP (!) 144/68   Pulse 76   Ht 6\' 2"  (1.88 m)   Wt 259 lb (117.5 kg)   BMI 33.25 kg/m  BMI: Body mass index is 33.25  kg/m. Well nourished, well developed, in no acute distress  HEENT: normocephalic, atraumatic  Neck: no JVD, carotid bruits or masses Cardiac:  normal S1, S2; RRR; extrasystoles noted,  no significant murmurs, no rubs, or gallops Lungs:  clear to auscultation bilaterally, no wheezing, rhonchi or rales  Abd: soft, nontender MS: no deformity or atrophy Ext: no edema  Skin: warm and dry, no rash Neuro:  No gross deficits appreciated Psych: euthymic mood, full affect  ICD site is well healed,  no tethering or discomfort, possibly a minimal amount  fluid surrounding device, no erythema, no increased heat to the surrounding tissues, non-tender.  EKG:  Done today and reviewed by myself shows SR, V paced, PVC's (quadragemeny) ICD interrogation today: normal device function, significant ectopy, only 80% BiVe paving (50% LV pacing), very brief NSVT episode  09/06/14: Echocardiogram Study Conclusions - Left ventricle: Diffuse hypokinesis worse in the inferior wall LVOT signal suggests low output. The cavity size was severely dilated. Wall thickness was normal. Systolic function was severely reduced. The estimated ejection fraction was in the range of 20% to 25%. - Left atrium: The atrium was moderately dilated. 31mm - Right ventricle: The cavity size was mildly dilated. - Atrial septum: No defect or patent foramen ovale was identified.   Recent Labs: 02/03/2016: TSH 31.32 03/07/2016: ALT 12; Brain Natriuretic Peptide 1,373.7 04/06/2016: Magnesium 1.9 05/14/2016: Hemoglobin 12.7; Platelets 229 06/29/2016: BUN 24; Creatinine, Ser 1.33; Potassium 4.3; Sodium 141  04/06/2016: Cholesterol 146; HDL 35.90; LDL Cholesterol 82; Total CHOL/HDL Ratio 4; Triglycerides 141.0; VLDL 28.2   Estimated Creatinine Clearance: 76.4 mL/min (by C-G formula based on SCr of 1.33 mg/dL).   Wt Readings from Last 3 Encounters:  07/19/16 259 lb (117.5 kg)  06/29/16 261 lb (118.4 kg)  05/21/16 245 lb (111.1 kg)      Other studies reviewed: Additional studies/records reviewed today include: summarized above  ASSESSMENT AND PLAN:   1. PVC's    no palpitations, CP    2. NICM     exam is euvolemic, weight is down 2 pounds from previous     optivol appears up, though exam does not support fluid OL  3. ICD site     Site may have small amount of fluid surrounding the device, no exam findings to suggest infection, no symptoms of illness reported by the patient, no chills or fever, [employer], ARNP evaluated site, also felt to be stable.    4. HTN     stable   Will check a mag level today, increase his coreg to 12.5mg  BID The patient is instructed should the device site become painful, red, warm or if he develops any symptoms of illnes to notify us immediately.      Disposition: F/u in 19mo to reassess his V ectopy, % bive pacing, sooner if needed.  Will evaluate possible add on ARB at that time, pending his need for further up-titration of BB and BP.  Current medicines are reviewed at length with the patient today.  The patient did not have any concerns regarding medicines.  Haywood Lasso, PA-C 07/19/2016 1:02 PM     [address] [place] [address] [phone] (office)  [phone] (fax)

## 2016-07-19 ENCOUNTER — Ambulatory Visit (INDEPENDENT_AMBULATORY_CARE_PROVIDER_SITE_OTHER): Payer: 59 | Admitting: Physician Assistant

## 2016-07-19 ENCOUNTER — Encounter: Payer: Self-pay | Admitting: Physician Assistant

## 2016-07-19 VITALS — BP 144/68 | HR 76 | Ht 74.0 in | Wt 259.0 lb

## 2016-07-19 DIAGNOSIS — I429 Cardiomyopathy, unspecified: Secondary | ICD-10-CM

## 2016-07-19 DIAGNOSIS — I493 Ventricular premature depolarization: Secondary | ICD-10-CM | POA: Diagnosis not present

## 2016-07-19 DIAGNOSIS — I10 Essential (primary) hypertension: Secondary | ICD-10-CM | POA: Diagnosis not present

## 2016-07-19 LAB — MAGNESIUM: MAGNESIUM: 1.7 mg/dL (ref 1.5–2.5)

## 2016-07-19 MED ORDER — CARVEDILOL 12.5 MG PO TABS: 12.5000 mg | ORAL_TABLET | Freq: Two times a day (BID) | ORAL | 5 refills | Status: DC

## 2016-07-19 NOTE — Patient Instructions (Signed)
Medication Instructions:   START TAKING COREG 12.5 MG TWICE A DAY  If you need a refill on your cardiac medications before your next appointment, please call your pharmacy.  Labwork: MAG TODAY    Testing/Procedures: NONE ORDER TODAY   Follow-Up: IN ONE MONTH WITH RENEE    Any Other Special Instructions Will Be Listed Below (If Applicable).

## 2016-07-21 ENCOUNTER — Other Ambulatory Visit: Payer: Self-pay | Admitting: Internal Medicine

## 2016-07-23 ENCOUNTER — Encounter: Payer: Self-pay | Admitting: Internal Medicine

## 2016-07-28 ENCOUNTER — Other Ambulatory Visit: Payer: Self-pay | Admitting: Internal Medicine

## 2016-07-29 ENCOUNTER — Telehealth: Payer: Self-pay | Admitting: *Deleted

## 2016-07-29 NOTE — Telephone Encounter (Signed)
No answer

## 2016-07-29 NOTE — Telephone Encounter (Signed)
-----   Message from [facility], [place] sent at 07/27/2016  8:31 AM EDT ----- Please let that patient know to go ahead and continue the 6.25mg  dose of his coreg, unchanged.  Since he is feeling well we can move his follow up appointment out to 6 months if he would like, since we are going back to his original dose of coreg.    Thanks [employer]

## 2016-08-04 ENCOUNTER — Ambulatory Visit (INDEPENDENT_AMBULATORY_CARE_PROVIDER_SITE_OTHER): Payer: 59

## 2016-08-04 ENCOUNTER — Telehealth: Payer: Self-pay

## 2016-08-04 DIAGNOSIS — I429 Cardiomyopathy, unspecified: Secondary | ICD-10-CM

## 2016-08-04 DIAGNOSIS — I5023 Acute on chronic systolic (congestive) heart failure: Secondary | ICD-10-CM

## 2016-08-04 DIAGNOSIS — Z95810 Presence of automatic (implantable) cardiac defibrillator: Secondary | ICD-10-CM | POA: Diagnosis not present

## 2016-08-04 NOTE — Progress Notes (Signed)
EPIC Encounter for ICM Monitoring  Patient Name: Mike Jimenez is a 65 y.o. male Date: 08/04/2016 Primary Care Physican: Kathlene November, MD Primary Cardiologist: Lovena Le Electrophysiologist: Lovena Le Dry Weight: unknown Bi-V Pacing:  79.5% (82.7% on 07/01/2016)      Attempted ICM call and unable to reach.  Transmission reviewed.   Thoracic impedance abnormal suggesting fluid accumulation.  Follow-up plan: ICM clinic phone appointment on 09/28/2016.  Office appointment with [name], PA on 08/25/2016.  Copy of ICM check sent to device physician.   ICM trend: 08/04/2016       Rosalene Billings, RN 08/04/2016 9:37 AM

## 2016-08-04 NOTE — Telephone Encounter (Signed)
Remote ICM transmission received.  Attempted patient call and left message for return call.   

## 2016-08-06 ENCOUNTER — Encounter: Payer: Self-pay | Admitting: Physician Assistant

## 2016-08-06 MED ORDER — CARVEDILOL 6.25 MG PO TABS: 6.2500 mg | ORAL_TABLET | Freq: Two times a day (BID) | ORAL | 2 refills | Status: DC

## 2016-08-06 NOTE — Addendum Note (Signed)
Addended by: Claude Manges on: 08/06/2016 02:04 PM   Modules accepted: Orders

## 2016-08-06 NOTE — Telephone Encounter (Signed)
See 8/31 telephone encounter for documentation on this    Stanton Kidney, RN at 07/29/2016 1:45 PM   Status: Signed    ----- Message from Baldwin [place], PA-C sent at 07/27/2016  8:31 AM EDT ----- Please let that patient know to go ahead and continue the 6.25mg  dose of his coreg, unchanged.  Since he is feeling well we can move his follow up appointment out to 6 months if he would like, since we are going back to his original dose of coreg.    Thanks Lance Sell, RN at 07/29/2016 1:45 PM   Status: Signed    No answer

## 2016-08-06 NOTE — Telephone Encounter (Signed)
SPOKE TO PT ABOUT CONTINUING COREG DOSE 6.25 TWICE A DAY ( 90 DAY NEW RX SENT IN ) AND AGREED WITH  PUSHING  HIS APPOINTMENT OUT 6 MONTHS DUE TO HIM [facility] BETTER

## 2016-08-06 NOTE — Telephone Encounter (Signed)
No answer  Will route to [employer] covering assist, Edwena Blow, to try and  reach patient to inform him of recommendations.

## 2016-08-20 ENCOUNTER — Ambulatory Visit: Payer: 59 | Admitting: Physician Assistant

## 2016-08-24 ENCOUNTER — Other Ambulatory Visit: Payer: Self-pay | Admitting: Internal Medicine

## 2016-08-24 ENCOUNTER — Encounter: Payer: 59 | Admitting: Internal Medicine

## 2016-08-27 ENCOUNTER — Encounter: Payer: Self-pay | Admitting: Internal Medicine

## 2016-09-28 ENCOUNTER — Ambulatory Visit (INDEPENDENT_AMBULATORY_CARE_PROVIDER_SITE_OTHER): Payer: 59

## 2016-09-28 ENCOUNTER — Telehealth: Payer: Self-pay

## 2016-09-28 DIAGNOSIS — I5022 Chronic systolic (congestive) heart failure: Secondary | ICD-10-CM | POA: Diagnosis not present

## 2016-09-28 DIAGNOSIS — Z95810 Presence of automatic (implantable) cardiac defibrillator: Secondary | ICD-10-CM | POA: Diagnosis not present

## 2016-09-28 NOTE — Progress Notes (Signed)
EPIC Encounter for ICM Monitoring  Patient Name: Mike Jimenez is a 65 y.o. male Date: 09/28/2016 Primary Care Physican: Kathlene November, MD Primary Cardiologist:Taylor Electrophysiologist: Lovena Le Dry Weight:    unknown Bi-V Pacing:  90.5%      Attempted ICM call and unable to reach. Left detailed message regarding transmission.  Transmission reviewed.   Thoracic impedance normal   Follow-up plan: ICM clinic phone appointment on 10/29/2016.  Copy of ICM check sent to device physician.   ICM trend: 09/28/2016       Rosalene Billings, RN 09/28/2016 12:23 PM

## 2016-09-28 NOTE — Telephone Encounter (Signed)
Remote ICM transmission received.  Attempted patient call and left detailed message regarding transmission and next ICM scheduled for 10/29/2016.  Advised to return call for any fluid symptoms or questions.

## 2016-09-29 ENCOUNTER — Other Ambulatory Visit: Payer: Self-pay | Admitting: Internal Medicine

## 2016-10-29 ENCOUNTER — Ambulatory Visit (INDEPENDENT_AMBULATORY_CARE_PROVIDER_SITE_OTHER): Payer: 59

## 2016-10-29 DIAGNOSIS — Z95810 Presence of automatic (implantable) cardiac defibrillator: Secondary | ICD-10-CM

## 2016-10-29 DIAGNOSIS — I5022 Chronic systolic (congestive) heart failure: Secondary | ICD-10-CM | POA: Diagnosis not present

## 2016-10-29 NOTE — Progress Notes (Signed)
EPIC Encounter for ICM Monitoring  Patient Name: Mike Jimenez is a 65 y.o. male Date: 10/29/2016 Primary Care Physican: Kathlene November, MD Primary Cardiologist:Taylor Electrophysiologist: Lovena Le Dry Weight:unknown Bi-V Pacing: 97.1%                Attempted ICM call and unable to reach. Left message to return call.  Transmission reviewed.   Thoracic impedance normal   Recommendations:  NONE - unable to reach  Follow-up plan: ICM clinic phone appointment on 12/02/2016.  Copy of ICM check sent to device physician.   ICM trend: 10/29/2016       Rosalene Billings, RN 10/29/2016 11:08 AM

## 2016-11-05 ENCOUNTER — Encounter: Payer: Self-pay | Admitting: Internal Medicine

## 2016-11-05 ENCOUNTER — Ambulatory Visit (INDEPENDENT_AMBULATORY_CARE_PROVIDER_SITE_OTHER): Payer: 59 | Admitting: Internal Medicine

## 2016-11-05 ENCOUNTER — Ambulatory Visit (HOSPITAL_BASED_OUTPATIENT_CLINIC_OR_DEPARTMENT_OTHER)
Admission: RE | Admit: 2016-11-05 | Discharge: 2016-11-05 | Disposition: A | Discharge status: Home / Self Care | Payer: 59 | Source: Ambulatory Visit | Attending: Internal Medicine | Admitting: Internal Medicine

## 2016-11-05 VITALS — BP 132/78 | HR 68 | Temp 97.9°F | Resp 14 | Ht 73.0 in | Wt 253.4 lb

## 2016-11-05 DIAGNOSIS — G8929 Other chronic pain: Secondary | ICD-10-CM | POA: Diagnosis not present

## 2016-11-05 DIAGNOSIS — E118 Type 2 diabetes mellitus with unspecified complications: Secondary | ICD-10-CM

## 2016-11-05 DIAGNOSIS — M25552 Pain in left hip: Secondary | ICD-10-CM | POA: Insufficient documentation

## 2016-11-05 DIAGNOSIS — Z0000 Encounter for general adult medical examination without abnormal findings: Secondary | ICD-10-CM | POA: Diagnosis not present

## 2016-11-05 DIAGNOSIS — Z1159 Encounter for screening for other viral diseases: Secondary | ICD-10-CM

## 2016-11-05 LAB — LIPID PANEL
CHOL/HDL RATIO: 5
Cholesterol: 172 mg/dL (ref 0–200)
HDL: 37.5 mg/dL — AB (ref >39.00)
LDL Cholesterol: 98 mg/dL (ref 0–99)
NONHDL: 134.37
TRIGLYCERIDES: 180 mg/dL — AB (ref 0.0–149.0)
VLDL: 36 mg/dL (ref 0.0–40.0)

## 2016-11-05 LAB — MICROALBUMIN / CREATININE URINE RATIO
CREATININE, U: 24.1 mg/dL
MICROALB UR: 5.4 mg/dL — AB (ref 0.0–1.9)
MICROALB/CREAT RATIO: 22.4 mg/g (ref 0.0–30.0)

## 2016-11-05 LAB — BASIC METABOLIC PANEL
BUN: 20 mg/dL (ref 6–23)
CALCIUM: 9.5 mg/dL (ref 8.4–10.5)
CO2: 30 meq/L (ref 19–32)
CREATININE: 0.94 mg/dL (ref 0.40–1.50)
Chloride: 104 mEq/L (ref 96–112)
GFR: 85.67 mL/min (ref >60.00)
Glucose, Bld: 112 mg/dL — ABNORMAL HIGH (ref 70–99)
Potassium: 4.1 mEq/L (ref 3.5–5.1)
SODIUM: 141 meq/L (ref 135–145)

## 2016-11-05 LAB — HEMOGLOBIN A1C: Hgb A1c MFr Bld: 6.5 % (ref 4.6–6.5)

## 2016-11-05 NOTE — Progress Notes (Signed)
Pre visit review using our clinic review tool, if applicable. No additional management support is needed unless otherwise documented below in the visit note. 

## 2016-11-05 NOTE — Progress Notes (Signed)
Subjective:    Patient ID: Mike Jimenez, male    DOB: [birth year], 65 y.o.   MRN: [id number]  DOS:  11/05/2016 Type of visit - description : Complete physical exam Interval history:  Good medication compliance, feels well in general.  BP Readings from Last 3 Encounters:  11/05/16 132/78  07/19/16 (!) 144/68  06/29/16 130/72    Review of Systems  Constitutional: No fever. No chills. No unexplained wt changes. No unusual sweats  HEENT: No dental problems, no ear discharge, no facial swelling, no voice changes. No eye discharge, no eye  redness , no  intolerance to light   Respiratory: No wheezing , no  difficulty breathing. No cough , no mucus production  Cardiovascular: No CP, no leg swelling , no  Palpitations  GI: no nausea, no vomiting, no diarrhea , no  abdominal pain.  No blood in the stools. No dysphagia, no odynophagia    Endocrine: No polyphagia, no polyuria , no polydipsia  GU: No dysuria, gross hematuria, difficulty urinating. No urinary urgency, no frequency.  Musculoskeletal: Pain at the hips, worse on the left, mostly when he sits down. No pain at work or when he walks  Skin: No change in the color of the skin, palor , no  Rash  Allergic, immunologic: No environmental allergies , no  food allergies  Neurological: No dizziness no  syncope. No headaches. No diplopia, no slurred, no slurred speech, no motor deficits, no facial  Numbness  Hematological: No enlarged lymph nodes, no easy bruising , no unusual bleedings  Psychiatry: No suicidal ideas, no hallucinations, no beavior problems, no confusion.  No unusual/severe anxiety, no depression  Past Medical History:  Diagnosis Date  . Anemia   . Arthritis    "knees" (09/05/2014)  . Automatic implantable cardioverter-defibrillator in situ   . CAD (coronary artery disease)    a. Mild nonobstructive CAD by cath 08/2014.  Marland Kitchen CHF (congestive heart failure) ([place])   . Childhood asthma   . Gastric ulcer 2011  .  GERD (gastroesophageal reflux disease)   . History of gout   . Hyperlipidemia   . Hypertension   . LBBB (left bundle branch block)   . NICM (nonischemic cardiomyopathy) ([place])    a. Dx 2011 - presented with sustained VT. Normal coronaries at that time, EF 10-15% by cath and 20-25% by echo. b. s/p biventricular ICD 2011. c. CP 08/2014: mild nonobstructive CAD.  Marland Kitchen Obesity   . Postsurgical hypothyroidism   . Thyroid cancer ([place])    a. 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 Dr Harlow Asa. s/p thyroidectomy 2013.  . Torn meniscus   . Type II diabetes mellitus ([place])   . Ventricular tachycardia ([place])    a. Sustained VT 2011 s/p MDT [id number] Concert BiV ICD, ser# [id number].    Past Surgical History:  Procedure Laterality Date  . BI-VENTRICULAR IMPLANTABLE CARDIOVERTER DEFIBRILLATOR  (CRT-D)  03/2010  . CARDIAC CATHETERIZATION  03/2010  . EP IMPLANTABLE DEVICE N/A 05/21/2016   Procedure:  ICD Generator Changeout;  Surgeon: Evans Lance, MD;  Location: [place] [facility];  Service: Cardiovascular;  Laterality: N/A;  . LEFT HEART CATHETERIZATION WITH CORONARY ANGIOGRAM N/A 09/06/2014   Procedure: LEFT HEART CATHETERIZATION WITH CORONARY ANGIOGRAM;  Surgeon: Burnell Blanks, MD;  Location: [facility] CATH LAB;  Service: Cardiovascular;  Laterality: N/A;  . PTX     traumatic, age 80 months  . THYROIDECTOMY  12/10/2011   Procedure: TOTAL THYROIDECTOMY WITH CENTRAL COMPARTMENT DISSECTION;  Surgeon: Earnstine Regal, MD;  Location: [place];  Service: General;  Laterality: N/A;  Total thyroidectomy with limited central compartment lymph node dissection.  Marland Kitchen VASECTOMY      Social History   Social History  . Marital status: Married    Spouse name: N/A  . Number of children: 2  . Years of education: N/A   Occupational History  . Warehouse work Medical laboratory scientific officer   Social History Main Topics  . Smoking status: Former Smoker    Packs/day: 2.50    Years: 2.00    Types: Cigarettes    Quit date: 11/29/1970  .  Smokeless tobacco: Former Systems developer    Types: Chew  . Alcohol use Yes     Comment: 09/05/2014 "2 drinks q couple months, if that  . Drug use: No  . Sexual activity: Yes   Other Topics Concern  . Not on file   Social History Narrative   Live w/ wife , daughter lives w/ them - in [place]   8 dogs, fish pond.   Job is very physical.   Does not routinely exercise.     Family History  Problem Relation Age of Onset  . Atrial fibrillation Father   . Hypertension Father     alive @ 40.  Marland Kitchen Hypothyroidism Mother   . Hypertension Mother     alive @ 28.  . Diabetes Maternal Grandfather   . Diabetes Maternal Aunt   . Colon cancer Maternal Aunt   . Colon cancer Maternal Aunt   . Lung cancer Paternal Uncle   . Lung cancer Maternal Uncle   . Asthma Sister   . Hypothyroidism Daughter   . Prostate cancer Neg Hx   . CAD Neg Hx        Medication List       Accurate as of 11/05/16 11:59 PM. Always use your most recent med list.          acetaminophen 500 MG tablet Commonly known as:  TYLENOL Take 1,000 mg by mouth every 6 (six) hours as needed (pain).   allopurinol 100 MG tablet Commonly known as:  ZYLOPRIM Take 1 tablet (100 mg total) by mouth daily.   aspirin EC 81 MG tablet Take 1 tablet (81 mg total) by mouth daily.   atorvastatin 20 MG tablet Commonly known as:  LIPITOR Take 1 tablet (20 mg total) by mouth daily.   carvedilol 6.25 MG tablet Commonly known as:  COREG Take 1 tablet (6.25 mg total) by mouth 2 (two) times daily with a meal.   Fish Oil 1000 MG Caps Take 1,000 mg by mouth 2 (two) times daily.   furosemide 20 MG tablet Commonly known as:  LASIX Take 2 tablets (40 mg total) by mouth daily.   levothyroxine 200 MCG tablet Commonly known as:  SYNTHROID, LEVOTHROID Take 200 mcg by mouth daily before breakfast.   MAGNESIUM PO Take 1 tablet by mouth daily.   metFORMIN 1000 MG tablet Commonly known as:  GLUCOPHAGE Take 1 tablet (1,000 mg total) by mouth 2  (two) times daily with a meal.   multivitamin with minerals Tabs tablet Take 1 tablet by mouth daily.   potassium chloride 10 MEQ tablet Commonly known as:  K-DUR,KLOR-CON Take 2 tablets (20 mEq total) by mouth daily.   sitaGLIPtin 100 MG tablet Commonly known as:  JANUVIA Take 1 tablet (100 mg total) by mouth daily.   tadalafil 20 MG tablet Commonly known as:  CIALIS Take 20 mg by mouth  daily as needed for erectile dysfunction.   vitamin E 400 UNIT capsule Take 400 Units by mouth daily.          Objective:   Physical Exam BP 132/78 (BP Location: Right Arm, Patient Position: Sitting, Cuff Size: Normal)   Pulse 68   Temp 97.9 F (36.6 C) (Oral)   Resp 14   Ht 6\' 1"  (1.854 m)   Wt 253 lb 6 oz (114.9 kg)   SpO2 98%   BMI 33.43 kg/m   General:   Well developed, well nourished . NAD.  Neck: No  thyromegaly  HEENT:  Normocephalic . Face symmetric, atraumatic Lungs:  CTA B Normal respiratory effort, no intercostal retractions, no accessory muscle use. Heart: RRR,  no murmur.  No pretibial edema bilaterally  Abdomen:  Not distended, soft, non-tender. No rebound or rigidity.   Rectal:  External abnormalities: none. Normal sphincter tone. No rectal masses or tenderness.  Stool brown  Prostate: Prostate gland firm and smooth, no enlargement, nodularity, tenderness, mass, asymmetry or induration.  Neurologic:  alert & oriented X3.  Speech normal, gait appropriate for age and unassisted Strength symmetric and appropriate for age.  Psych: Cognition and judgment appear intact.  Cooperative with normal attention span and concentration.  Behavior appropriate. No anxious or depressed appearing.    Assessment & Plan:   Assessment DM HTN Hyperlipidemia Hypothyroidism h/oThyroid cancer, nodularity, no mets, thyroidectomy 2013- sees Dr Buddy Duty yearly as off 05-2016  CV: --CHF/nonischemic cardiomyopathy dx 2011 --Ventricular tachycardia, 2011, normal coronaries, EF  ~15  -20 % -- s/p  biventricular ICD 2011--- replaced 04-2016 --CAD -Nonobstructive   by cath 08-2014 --LBBB  GI: --GERD --H/o anemia:  --Gastric ulcer per EGD 07-2010 --Colonoscopy 2011:4 polyps  H/o gout  PLAN DM: Based on last A1c, Januvia was added to metformin. Check labs. Hyperlipidemia: Continue Lipitor, check a FLP Hypothyroidism: Follow-up by endocrinology CV: Seems to be stable, follow-up by cardiology. Hip pain: DJD ?, lost fat pad?. Will get a x-ray for completeness. Otherwise observation. RTC 4-5 months

## 2016-11-05 NOTE — Patient Instructions (Signed)
GO TO THE LAB : Get the blood work     GO TO THE FRONT DESK Schedule your next appointment for a  routine checkup in 4-5 months  

## 2016-11-05 NOTE — Assessment & Plan Note (Addendum)
Td 2015; Pneumovax 2011; Prevnar 2015; zostavax 2016; had a flu shot    CCS:  Colonoscopy 9- 2011 4 polyps, due for a colonoscopy, plans to proceed early next year Prostate cancer screening: DRE normal today, check a PSA.   Diet and exercise discussed Labs: BMP, FLP, A1c, hep C, PSA.

## 2016-11-06 LAB — HEPATITIS C ANTIBODY: HCV Ab: NEGATIVE

## 2016-11-06 NOTE — Assessment & Plan Note (Signed)
DM: Based on last A1c, Januvia was added to metformin. Check labs. Hyperlipidemia: Continue Lipitor, check a FLP Hypothyroidism: Follow-up by endocrinology CV: Seems to be stable, follow-up by cardiology. Hip pain: DJD ?, lost fat pad?. Will get a x-ray for completeness. Otherwise observation. RTC 4-5 months

## 2016-11-09 MED ORDER — ATORVASTATIN CALCIUM 40 MG PO TABS: 40.0000 mg | ORAL_TABLET | Freq: Every day | ORAL | 5 refills | Status: DC

## 2016-11-09 NOTE — Addendum Note (Signed)
Addended byDamita Dunnings D on: 11/09/2016 09:37 AM   Modules accepted: Orders

## 2016-12-02 ENCOUNTER — Ambulatory Visit (INDEPENDENT_AMBULATORY_CARE_PROVIDER_SITE_OTHER): Payer: 59

## 2016-12-02 DIAGNOSIS — Z95810 Presence of automatic (implantable) cardiac defibrillator: Secondary | ICD-10-CM | POA: Diagnosis not present

## 2016-12-02 DIAGNOSIS — I5022 Chronic systolic (congestive) heart failure: Secondary | ICD-10-CM | POA: Diagnosis not present

## 2016-12-03 ENCOUNTER — Telehealth: Payer: Self-pay

## 2016-12-03 NOTE — Telephone Encounter (Signed)
Remote ICM transmission received.  Attempted patient call and left detailed message regarding transmission and next ICM scheduled for 01/03/2017.  Advised to return call for any fluid symptoms or questions.

## 2016-12-03 NOTE — Progress Notes (Signed)
EPIC Encounter for ICM Monitoring  Patient Name: Mike Jimenez is a 66 y.o. male Date: 12/03/2016 Primary Care Physican: Kathlene November, MD Primary Cardiologist:Taylor Electrophysiologist: Lovena Le Dry Weight:unknown Bi-V Pacing: 97.6%      Attempted ICM call and unable to reach.  Left detailed message regarding transmission.  Transmission reviewed.   Thoracic impedance normal   Recommendations:  Left direct ICM number and encouraged to call for fluid symptoms.    Follow-up plan: ICM clinic phone appointment on 01/03/2017.  Copy of ICM check sent to device physician.   3 month ICM trend : 12/02/2016   1 Year ICM trend:      Rosalene Billings, RN 12/03/2016 10:15 AM

## 2017-01-03 ENCOUNTER — Ambulatory Visit (INDEPENDENT_AMBULATORY_CARE_PROVIDER_SITE_OTHER): Payer: 59

## 2017-01-03 ENCOUNTER — Telehealth: Payer: Self-pay

## 2017-01-03 DIAGNOSIS — I5023 Acute on chronic systolic (congestive) heart failure: Secondary | ICD-10-CM

## 2017-01-03 DIAGNOSIS — I5022 Chronic systolic (congestive) heart failure: Secondary | ICD-10-CM

## 2017-01-03 DIAGNOSIS — Z95810 Presence of automatic (implantable) cardiac defibrillator: Secondary | ICD-10-CM | POA: Diagnosis not present

## 2017-01-03 NOTE — Progress Notes (Signed)
EPIC Encounter for ICM Monitoring  Patient Name: Mike Jimenez is a 66 y.o. male Date: 01/03/2017 Primary Care Physican: Kathlene November, MD Primary Cardiologist:Taylor Electrophysiologist: Lovena Le Dry Weight:unknown Bi-V Pacing: 95.8%        Attempted call to patient and unable to reach.  Transmission reviewed.   Thoracic impedance normal   Recommendations: NONE - Unable to reach patient   Follow-up plan: ICM clinic phone appointment on 02/03/2017.  Copy of ICM check sent to device physician.   3 month ICM trend: 01/03/2017  1 Year ICM trend:      Rosalene Billings, RN 01/03/2017 1:53 PM

## 2017-01-03 NOTE — Telephone Encounter (Signed)
Remote ICM transmission received.  Attempted patient call and no answer or answering machine.  

## 2017-01-07 ENCOUNTER — Other Ambulatory Visit: Payer: Self-pay | Admitting: Internal Medicine

## 2017-01-20 ENCOUNTER — Other Ambulatory Visit: Payer: Self-pay | Admitting: Internal Medicine

## 2017-02-03 ENCOUNTER — Telehealth: Payer: Self-pay

## 2017-02-03 ENCOUNTER — Ambulatory Visit (INDEPENDENT_AMBULATORY_CARE_PROVIDER_SITE_OTHER): Payer: 59

## 2017-02-03 DIAGNOSIS — Z95810 Presence of automatic (implantable) cardiac defibrillator: Secondary | ICD-10-CM | POA: Diagnosis not present

## 2017-02-03 DIAGNOSIS — I5022 Chronic systolic (congestive) heart failure: Secondary | ICD-10-CM | POA: Diagnosis not present

## 2017-02-03 NOTE — Telephone Encounter (Signed)
Remote ICM transmission received.  Attempted patient call and left message to return call.   

## 2017-02-03 NOTE — Progress Notes (Signed)
EPIC Encounter for ICM Monitoring  Patient Name: Mike Jimenez is a 66 y.o. male Date: 02/03/2017 Primary Care Physican: Kathlene November, MD Primary Cardiologist:Taylor Electrophysiologist: Lovena Le Dry Weight:unknown Bi-V Pacing: 96.8%       Attempted call to patient and unable to reach.  Left message to return call. Transmission reviewed.    Thoracic impedance normal.  Prescribed dosage: Furosemide 20 mg 2 tablets (40 mg total) daily.  Potassium 10 mEq 2 tablets (20 mEq total) daily.    Recommendations: NONE - Unable to reach patient   Follow-up plan: ICM clinic phone appointment on 03/08/2017.  Copy of ICM check sent to device physician.   3 month ICM trend: 02/03/2017   1 Year ICM trend:      Rosalene Billings, RN 02/03/2017 1:30 PM

## 2017-02-10 NOTE — Progress Notes (Signed)
Patient returned call and stated he is feeling well and denied any fluid symptoms.  Reviewed transmission and advised to call for any fluid symptoms.  No changes today.  Next ICM remote transmission 03/08/2017.

## 2017-03-08 ENCOUNTER — Ambulatory Visit (INDEPENDENT_AMBULATORY_CARE_PROVIDER_SITE_OTHER): Payer: 59

## 2017-03-08 DIAGNOSIS — Z95810 Presence of automatic (implantable) cardiac defibrillator: Secondary | ICD-10-CM

## 2017-03-08 DIAGNOSIS — I5022 Chronic systolic (congestive) heart failure: Secondary | ICD-10-CM

## 2017-03-08 NOTE — Progress Notes (Signed)
EPIC Encounter for ICM Monitoring  Patient Name: Mike Jimenez is a 66 y.o. male Date: 03/08/2017 Primary Care Physican: Kathlene November, MD Primary Cardiologist:Taylor Electrophysiologist: Lovena Le Dry Weight:unknown Bi-V Pacing: 98.4%      Heart Failure questions reviewed, pt asymptomatic    Thoracic impedance normal   Prescribed dosage: Furosemide 20 mg 2 tablets (40 mg total) daily.  Potassium 10 mEq 2 tablets (20 mEq total) daily.    Recommendations: No changes. Discussed to limit salt intake to 2000 mg/day and fluid intake to < 2 liters/day.  Encouraged to call for fluid symptoms.  Follow-up plan: ICM clinic phone appointment on 04/08/2017.    Copy of ICM check sent to device physician.   3 month ICM trend: 03/08/2017   1 Year ICM trend:      Rosalene Billings, RN 03/08/2017 9:55 AM

## 2017-03-18 NOTE — Progress Notes (Signed)
ICM remote transmission rescheduled from 04/08/2017 to 04/11/2017.  

## 2017-03-22 ENCOUNTER — Encounter: Payer: Self-pay | Admitting: Internal Medicine

## 2017-04-04 ENCOUNTER — Other Ambulatory Visit: Payer: Self-pay | Admitting: Internal Medicine

## 2017-04-11 ENCOUNTER — Ambulatory Visit (INDEPENDENT_AMBULATORY_CARE_PROVIDER_SITE_OTHER): Payer: 59 | Admitting: Internal Medicine

## 2017-04-11 ENCOUNTER — Telehealth: Payer: Self-pay

## 2017-04-11 ENCOUNTER — Encounter: Payer: Self-pay | Admitting: Internal Medicine

## 2017-04-11 ENCOUNTER — Ambulatory Visit (INDEPENDENT_AMBULATORY_CARE_PROVIDER_SITE_OTHER): Payer: 59

## 2017-04-11 VITALS — BP 124/68 | HR 74 | Temp 98.1°F | Resp 14 | Ht 73.0 in | Wt 256.4 lb

## 2017-04-11 DIAGNOSIS — I5022 Chronic systolic (congestive) heart failure: Secondary | ICD-10-CM

## 2017-04-11 DIAGNOSIS — D509 Iron deficiency anemia, unspecified: Secondary | ICD-10-CM | POA: Diagnosis not present

## 2017-04-11 DIAGNOSIS — E118 Type 2 diabetes mellitus with unspecified complications: Secondary | ICD-10-CM

## 2017-04-11 DIAGNOSIS — E785 Hyperlipidemia, unspecified: Secondary | ICD-10-CM | POA: Diagnosis not present

## 2017-04-11 DIAGNOSIS — M109 Gout, unspecified: Secondary | ICD-10-CM

## 2017-04-11 DIAGNOSIS — Z23 Encounter for immunization: Secondary | ICD-10-CM | POA: Diagnosis not present

## 2017-04-11 DIAGNOSIS — Z95810 Presence of automatic (implantable) cardiac defibrillator: Secondary | ICD-10-CM

## 2017-04-11 LAB — CBC WITH DIFFERENTIAL/PLATELET
BASOS PCT: 0.5 % (ref 0.0–3.0)
Basophils Absolute: 0 10*3/uL (ref 0.0–0.1)
Eosinophils Absolute: 0.1 10*3/uL (ref 0.0–0.7)
Eosinophils Relative: 2.7 % (ref 0.0–5.0)
HEMATOCRIT: 38.7 % — AB (ref 39.0–52.0)
HEMOGLOBIN: 13 g/dL (ref 13.0–17.0)
Lymphocytes Relative: 14.8 % (ref 12.0–46.0)
Lymphs Abs: 0.8 10*3/uL (ref 0.7–4.0)
MCHC: 33.7 g/dL (ref 30.0–36.0)
MCV: 90.4 fl (ref 78.0–100.0)
Monocytes Absolute: 0.5 10*3/uL (ref 0.1–1.0)
Monocytes Relative: 8.7 % (ref 3.0–12.0)
Neutro Abs: 4 10*3/uL (ref 1.4–7.7)
Neutrophils Relative %: 73.3 % (ref 43.0–77.0)
Platelets: 232 10*3/uL (ref 150.0–400.0)
RBC: 4.28 Mil/uL (ref 4.22–5.81)
RDW: 14.1 % (ref 11.5–15.5)
WBC: 5.4 10*3/uL (ref 4.0–10.5)

## 2017-04-11 LAB — COMPREHENSIVE METABOLIC PANEL
ALBUMIN: 4.4 g/dL (ref 3.5–5.2)
ALT: 13 U/L (ref 0–53)
AST: 16 U/L (ref 0–37)
Alkaline Phosphatase: 69 U/L (ref 39–117)
BILIRUBIN TOTAL: 0.5 mg/dL (ref 0.2–1.2)
BUN: 13 mg/dL (ref 6–23)
CALCIUM: 9.2 mg/dL (ref 8.4–10.5)
CHLORIDE: 104 meq/L (ref 96–112)
CO2: 29 mEq/L (ref 19–32)
CREATININE: 0.99 mg/dL (ref 0.40–1.50)
GFR: 80.59 mL/min (ref >60.00)
Glucose, Bld: 123 mg/dL — ABNORMAL HIGH (ref 70–99)
Potassium: 4 mEq/L (ref 3.5–5.1)
Sodium: 141 mEq/L (ref 135–145)
Total Protein: 7.4 g/dL (ref 6.0–8.3)

## 2017-04-11 LAB — LIPID PANEL
CHOLESTEROL: 133 mg/dL (ref 0–200)
HDL: 39.6 mg/dL (ref >39.00)
LDL Cholesterol: 73 mg/dL (ref 0–99)
NONHDL: 93.83
Total CHOL/HDL Ratio: 3
Triglycerides: 102 mg/dL (ref 0.0–149.0)
VLDL: 20.4 mg/dL (ref 0.0–40.0)

## 2017-04-11 LAB — HEMOGLOBIN A1C: Hgb A1c MFr Bld: 6.4 % (ref 4.6–6.5)

## 2017-04-11 MED ORDER — TADALAFIL 20 MG PO TABS: 20.0000 mg | ORAL_TABLET | Freq: Every day | ORAL | 5 refills | Status: AC | PRN

## 2017-04-11 NOTE — Progress Notes (Signed)
Subjective:    Patient ID: Mike Jimenez, male    DOB: [birth year], 66 y.o.   MRN: [id number]  DOS:  04/11/2017 Type of visit - description : rov Interval history: CHF: Since the last visit, he developed early morning leg cramps, Lasix and potassium dose reduce, cramps significantly decrease. Denies lower extremity edema, DOE. HTN: Remains well controlled when BP is checked. He actually change carvedilol from 6.25 mg twice a day to 12.5 mg daily and that works well for him. ED: Symptoms have returned to some extent, needs a refill on Cialis DM: Good medication compliance, CBGs in the morning usually 120, occ.in the  90s High cholesterol: Lipitor dose increased, good compliance without apparent side effects  Review of Systems Denies nausea, vomiting, diarrhea. No blood in the stools. Libido is normal No claudication symptoms.  Past Medical History:  Diagnosis Date  . Anemia   . Arthritis    "knees" (09/05/2014)  . Automatic implantable cardioverter-defibrillator in situ   . CAD (coronary artery disease)    a. Mild nonobstructive CAD by cath 08/2014.  Marland Kitchen CHF (congestive heart failure) ([place])   . Childhood asthma   . Gastric ulcer 2011  . GERD (gastroesophageal reflux disease)   . History of gout   . Hyperlipidemia   . Hypertension   . LBBB (left bundle branch block)   . NICM (nonischemic cardiomyopathy) ([place])    a. Dx 2011 - presented with sustained VT. Normal coronaries at that time, EF 10-15% by cath and 20-25% by echo. b. s/p biventricular ICD 2011. c. CP 08/2014: mild nonobstructive CAD.  Marland Kitchen Obesity   . Postsurgical hypothyroidism   . Thyroid cancer ([place])    a. 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 Dr Harlow Asa. s/p thyroidectomy 2013.  . Torn meniscus   . Type II diabetes mellitus ([place])   . Ventricular tachycardia ([place])    a. Sustained VT 2011 s/p MDT [id number] Concert BiV ICD, ser# [id number] H.    Past Surgical History:  Procedure Laterality Date  .  BI-VENTRICULAR IMPLANTABLE CARDIOVERTER DEFIBRILLATOR  (CRT-D)  03/2010  . CARDIAC CATHETERIZATION  03/2010  . EP IMPLANTABLE DEVICE N/A 05/21/2016   Procedure:  ICD Generator Changeout;  Surgeon: Evans Lance, MD;  Location: [place] [facility];  Service: Cardiovascular;  Laterality: N/A;  . LEFT HEART CATHETERIZATION WITH CORONARY ANGIOGRAM N/A 09/06/2014   Procedure: LEFT HEART CATHETERIZATION WITH CORONARY ANGIOGRAM;  Surgeon: Burnell Blanks, MD;  Location: [facility] CATH LAB;  Service: Cardiovascular;  Laterality: N/A;  . PTX     traumatic, age 34 months  . THYROIDECTOMY  12/10/2011   Procedure: TOTAL THYROIDECTOMY WITH CENTRAL COMPARTMENT DISSECTION;  Surgeon: Earnstine Regal, MD;  Location: [place];  Service: General;  Laterality: N/A;  Total thyroidectomy with limited central compartment lymph node dissection.  Marland Kitchen VASECTOMY      Social History   Social History  . Marital status: Married    Spouse name: N/A  . Number of children: 2  . Years of education: N/A   Occupational History  . Warehouse work Medical laboratory scientific officer   Social History Main Topics  . Smoking status: Former Smoker    Packs/day: 2.50    Years: 2.00    Types: Cigarettes    Quit date: 11/29/1970  . Smokeless tobacco: Former Systems developer    Types: Chew  . Alcohol use Yes     Comment: 09/05/2014 "2 drinks q couple months, if that  . Drug use: No  . Sexual activity:  Yes   Other Topics Concern  . Not on file   Social History Narrative   Live w/ wife , daughter lives w/ them - in [place]   8 dogs, fish pond.   Job is very physical.   Does not routinely exercise.      Allergies as of 04/11/2017      Reactions   Losartan Other (See Comments)   Leg cramps   Ramipril Cough      Medication List       Accurate as of 04/11/17 11:59 PM. Always use your most recent med list.          acetaminophen 500 MG tablet Commonly known as:  TYLENOL Take 1,000 mg by mouth every 6 (six) hours as needed (pain).   allopurinol 100 MG tablet Commonly  known as:  ZYLOPRIM Take 1 tablet (100 mg total) by mouth daily.   aspirin EC 81 MG tablet Take 1 tablet (81 mg total) by mouth daily.   atorvastatin 40 MG tablet Commonly known as:  LIPITOR Take 1 tablet (40 mg total) by mouth daily.   carvedilol 12.5 MG tablet Commonly known as:  COREG Take 12.5 mg by mouth daily.   Fish Oil 1000 MG Caps Take 1,000 mg by mouth 2 (two) times daily.   furosemide 20 MG tablet Commonly known as:  LASIX Take 20 mg by mouth daily.   levothyroxine 200 MCG tablet Commonly known as:  SYNTHROID, LEVOTHROID Take 200 mcg by mouth daily before breakfast.   MAGNESIUM PO Take 1 tablet by mouth daily.   metFORMIN 1000 MG tablet Commonly known as:  GLUCOPHAGE Take 1 tablet (1,000 mg total) by mouth 2 (two) times daily with a meal.   multivitamin with minerals Tabs tablet Take 1 tablet by mouth daily.   potassium chloride 10 MEQ tablet Commonly known as:  KLOR-CON M10 Take 1 tablet (10 mEq total) by mouth daily.   sitaGLIPtin 100 MG tablet Commonly known as:  JANUVIA Take 1 tablet (100 mg total) by mouth daily.   tadalafil 20 MG tablet Commonly known as:  CIALIS Take 1 tablet (20 mg total) by mouth daily as needed for erectile dysfunction.   vitamin E 400 UNIT capsule Take 400 Units by mouth daily.          Objective:   Physical Exam BP 124/68 (BP Location: Left Arm, Patient Position: Sitting, Cuff Size: Normal)   Pulse 74   Temp 98.1 F (36.7 C) (Oral)   Resp 14   Ht 6\' 1"  (1.854 m)   Wt 256 lb 6 oz (116.3 kg)   SpO2 96%   BMI 33.82 kg/m  General:   Well developed, well nourished . NAD.  HEENT:  Normocephalic . Face symmetric, atraumatic Lungs:  CTA B Normal respiratory effort, no intercostal retractions, no accessory muscle use. Heart: RRR,  no murmur.  No pretibial edema bilaterally  DIABETIC FEET EXAM: No lower extremity edema Normal pedal pulses bilaterally Skin : Some hemosiderin at the ankles Pinprick examination  : Decrease sensitivity, mild and the distal left plantar area Neurologic:  alert & oriented X3.  Speech normal, gait appropriate for age and unassisted Psych--  Cognition and judgment appear intact.  Cooperative with normal attention span and concentration.  Behavior appropriate. No anxious or depressed appearing.      Assessment & Plan:   Assessment DM, + Mild neuropathy on exam 03/2017 HTN Hyperlipidemia Hypothyroidism h/oThyroid cancer, nodularity, no mets, thyroidectomy 2013- sees Dr Buddy Duty yearly as  off 05-2016  CV: --CHF/nonischemic cardiomyopathy dx 2011 --Ventricular tachycardia, 2011, normal coronaries, EF  ~15 -20 % -- s/p  biventricular ICD 2011--- replaced 04-2016 --CAD -Nonobstructive   by cath 08-2014 --LBBB  GI: --GERD --H/o anemia:  --Gastric ulcer per EGD 07-2010 --Colonoscopy 2011:4 polyps  H/o gout  PLAN DM: Continue metformin, Januvia. Check A1c. Feet exam with my neuropathy, feet care discussed HTN: Well-controlled with current meds: Carvedilol, Lasix, potassium. CHF: Decreased Lasix dose and potassium supplements to one tablet daily due to leg cramps, sxs resolved, he continue to do well. Check a CMP Gout: on Allopurinol, only had one attack, mild, since the last visit ED: Refill Cialis Primary care: Pneumonia shot today. RTC 9-18 for CPX (a little early but that's patient's preference)

## 2017-04-11 NOTE — Progress Notes (Signed)
EPIC Encounter for ICM Monitoring  Patient Name: Mike Jimenez is a 66 y.o. male Date: 04/11/2017 Primary Care Physican: Colon Branch, MD Primary Cardiologist:Taylor Electrophysiologist: Druscilla Brownie Weight:unknown Bi-V Pacing: 98.4%       Attempted call to patient and unable to reach.  Left message to return call.  Transmission reviewed.    Thoracic impedance slightly abnormal suggesting fluid accumulation since 03/19/2017 but is trending toward baseline today.  Prescribed dosage: Furosemide 20 mg 1 tablets (20 mg total) daily (decreased by Dr [name] on 4/24 in response to patient's leg cramps). Potassium 10 mEq 1 tablets (10 mEq total) daily (decreased by Dr [name] 4/24).  Recommendations:  NONE - Unable to reach patient   Follow-up plan: ICM clinic phone appointment on 05/12/2017.  He is due for office visit with Dr Lovena Le.  Copy of ICM check sent to device physician.   3 month ICM trend: 04/11/2017   1 Year ICM trend:      Rosalene Billings, RN 04/11/2017 12:09 PM

## 2017-04-11 NOTE — Telephone Encounter (Signed)
Remote ICM transmission received.  Attempted patient call and left message to return call.   

## 2017-04-11 NOTE — Progress Notes (Signed)
Pre visit review using our clinic review tool, if applicable. No additional management support is needed unless otherwise documented below in the visit note. 

## 2017-04-11 NOTE — Patient Instructions (Signed)
GO TO THE LAB : Get the blood work     GO TO THE FRONT DESK Schedule your next appointment for a  physical exam by September 2018    Diabetes and Foot Care Diabetes may cause you to have problems because of poor blood supply (circulation) to your feet and legs. This may cause the skin on your feet to become thinner, break easier, and heal more slowly. Your skin may become dry, and the skin may peel and crack. You may also have nerve damage in your legs and feet causing decreased feeling in them. You may not notice minor injuries to your feet that could lead to infections or more serious problems. Taking care of your feet is one of the most important things you can do for yourself. Follow these instructions at home:  Wear shoes at all times, even in the house. Do not go barefoot. Bare feet are easily injured.  Check your feet daily for blisters, cuts, and redness. If you cannot see the bottom of your feet, use a mirror or ask someone for help.  Wash your feet with warm water (do not use hot water) and mild soap. Then pat your feet and the areas between your toes until they are completely dry. Do not soak your feet as this can dry your skin.  Apply a moisturizing lotion or petroleum jelly (that does not contain alcohol and is unscented) to the skin on your feet and to dry, brittle toenails. Do not apply lotion between your toes.  Trim your toenails straight across. Do not dig under them or around the cuticle. File the edges of your nails with an emery board or nail file.  Do not cut corns or calluses or try to remove them with medicine.  Wear clean socks or stockings every day. Make sure they are not too tight. Do not wear knee-high stockings since they may decrease blood flow to your legs.  Wear shoes that fit properly and have enough cushioning. To break in new shoes, wear them for just a few hours a day. This prevents you from injuring your feet. Always look in your shoes before you put  them on to be sure there are no objects inside.  Do not cross your legs. This may decrease the blood flow to your feet.  If you find a minor scrape, cut, or break in the skin on your feet, keep it and the skin around it clean and dry. These areas may be cleansed with mild soap and water. Do not cleanse the area with peroxide, alcohol, or iodine.  When you remove an adhesive bandage, be sure not to damage the skin around it.  If you have a wound, look at it several times a day to make sure it is healing.  Do not use heating pads or hot water bottles. They may burn your skin. If you have lost feeling in your feet or legs, you may not know it is happening until it is too late.  Make sure your health care provider performs a complete foot exam at least annually or more often if you have foot problems. Report any cuts, sores, or bruises to your health care provider immediately. Contact a health care provider if:  You have an injury that is not healing.  You have cuts or breaks in the skin.  You have an ingrown nail.  You notice redness on your legs or feet.  You feel burning or tingling in your legs or feet.  You have pain or cramps in your legs and feet.  Your legs or feet are numb.  Your feet always feel cold. Get help right away if:  There is increasing redness, swelling, or pain in or around a wound.  There is a red line that goes up your leg.  Pus is coming from a wound.  You develop a fever or as directed by your health care provider.  You notice a bad smell coming from an ulcer or wound. This information is not intended to replace advice given to you by your health care provider. Make sure you discuss any questions you have with your health care provider. Document Released: 11/12/2000 Document Revised: 04/22/2016 Document Reviewed: 04/24/2013 Elsevier Interactive Patient Education  2017 [employer].

## 2017-04-12 NOTE — Assessment & Plan Note (Signed)
PLAN DM: Continue metformin, Januvia. Check A1c. Feet exam with my neuropathy, feet care discussed HTN: Well-controlled with current meds: Carvedilol, Lasix, potassium. CHF: Decreased Lasix dose and potassium supplements to one tablet daily due to leg cramps, sxs resolved, he continue to do well. Check a CMP Gout: on Allopurinol, only had one attack, mild, since the last visit ED: Refill Cialis Primary care: Pneumonia shot today. RTC 9-18 for CPX (a little early but that's patient's preference)

## 2017-04-14 NOTE — Progress Notes (Signed)
Attempted call back to patient and left detailed message regarding transmission and advised to call back if having any fluid symptoms.

## 2017-04-14 NOTE — Progress Notes (Signed)
Patient left voice mail returning call.

## 2017-04-22 ENCOUNTER — Other Ambulatory Visit: Payer: Self-pay | Admitting: Internal Medicine

## 2017-05-12 ENCOUNTER — Telehealth: Payer: Self-pay

## 2017-05-12 ENCOUNTER — Ambulatory Visit (INDEPENDENT_AMBULATORY_CARE_PROVIDER_SITE_OTHER): Payer: 59

## 2017-05-12 ENCOUNTER — Encounter: Payer: Self-pay | Admitting: Family Medicine

## 2017-05-12 ENCOUNTER — Ambulatory Visit (INDEPENDENT_AMBULATORY_CARE_PROVIDER_SITE_OTHER): Payer: 59 | Admitting: Family Medicine

## 2017-05-12 VITALS — BP 136/74 | HR 61 | Temp 98.0°F | Ht 73.0 in | Wt 259.4 lb

## 2017-05-12 DIAGNOSIS — S46812A Strain of other muscles, fascia and tendons at shoulder and upper arm level, left arm, initial encounter: Secondary | ICD-10-CM

## 2017-05-12 DIAGNOSIS — Z95810 Presence of automatic (implantable) cardiac defibrillator: Secondary | ICD-10-CM | POA: Diagnosis not present

## 2017-05-12 DIAGNOSIS — I5022 Chronic systolic (congestive) heart failure: Secondary | ICD-10-CM

## 2017-05-12 DIAGNOSIS — M19019 Primary osteoarthritis, unspecified shoulder: Secondary | ICD-10-CM

## 2017-05-12 MED ORDER — TRIAMCINOLONE ACETONIDE 40 MG/ML IJ SUSP
20.0000 mg | Freq: Once | INTRAMUSCULAR | Status: AC
  Administered 2017-05-12: 20 mg

## 2017-05-12 MED ORDER — LIDOCAINE HCL 1 % IJ SOLN
0.5000 mL | Freq: Once | INTRAMUSCULAR | Status: AC
  Administered 2017-05-12: 0.5 mL

## 2017-05-12 NOTE — Telephone Encounter (Signed)
Remote ICM transmission received.  Attempted patient call and left detailed message regarding transmission and next ICM scheduled for 06/14/2017.  Advised to return call for any fluid symptoms or questions.

## 2017-05-12 NOTE — Progress Notes (Signed)
Musculoskeletal Exam  Patient: Mike Jimenez DOB: 1951-11-04  DOS: 05/12/2017  SUBJECTIVE:  Chief Complaint:   Chief Complaint  Patient presents with  . Shoulder Pain    (L) chronic pain got worse since last Friday-using heat and it did help    Mike Jimenez is a 66 y.o.  male for evaluation and treatment of L shoulder pain.   Onset:  6 days ago got worse. Sudden, no change in activity. Last year ICD modified and started nagging pain Works at [employer] and lifts bags ~40 lbs Location: top of shoulder and a little behind Character:  aching  Progression of issue:  worsened Associated symptoms: decreased ROM It does not catch or lock on him.  No redness, bruising, or swelling. Treatment: to date has been heat and activity modification.   Neurovascular symptoms: no  ROS: Musculoskeletal/Extremities: +shoulder pain Neurologic: no numbness, tingling no weakness   Past Medical History:  Diagnosis Date  . Anemia   . Arthritis    "knees" (09/05/2014)  . Automatic implantable cardioverter-defibrillator in situ   . CAD (coronary artery disease)    a. Mild nonobstructive CAD by cath 08/2014.  Marland Kitchen CHF (congestive heart failure) ([place])   . Childhood asthma   . Gastric ulcer 2011  . GERD (gastroesophageal reflux disease)   . History of gout   . Hyperlipidemia   . Hypertension   . LBBB (left bundle branch block)   . NICM (nonischemic cardiomyopathy) ([place])    a. Dx 2011 - presented with sustained VT. Normal coronaries at that time, EF 10-15% by cath and 20-25% by echo. b. s/p biventricular ICD 2011. c. CP 08/2014: mild nonobstructive CAD.  Marland Kitchen Obesity   . Postsurgical hypothyroidism   . Thyroid cancer ([place])    a. 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 Dr Harlow Asa. s/p thyroidectomy 2013.  . Torn meniscus   . Type II diabetes mellitus ([place])   . Ventricular tachycardia ([place])    a. Sustained VT 2011 s/p MDT [id number] Concert BiV ICD, ser# [id number] H.   Past Surgical History:   Procedure Laterality Date  . BI-VENTRICULAR IMPLANTABLE CARDIOVERTER DEFIBRILLATOR  (CRT-D)  03/2010  . CARDIAC CATHETERIZATION  03/2010  . EP IMPLANTABLE DEVICE N/A 05/21/2016   Procedure:  ICD Generator Changeout;  Surgeon: Evans Lance, MD;  Location: [facility];  Service: Cardiovascular;  Laterality: N/A;  . LEFT HEART CATHETERIZATION WITH CORONARY ANGIOGRAM N/A 09/06/2014   Procedure: LEFT HEART CATHETERIZATION WITH CORONARY ANGIOGRAM;  Surgeon: Burnell Blanks, MD;  Location: [facility] CATH LAB;  Service: Cardiovascular;  Laterality: N/A;  . PTX     traumatic, age 12 months  . THYROIDECTOMY  12/10/2011   Procedure: TOTAL THYROIDECTOMY WITH CENTRAL COMPARTMENT DISSECTION;  Surgeon: Earnstine Regal, MD;  Location: [facility];  Service: General;  Laterality: N/A;  Total thyroidectomy with limited central compartment lymph node dissection.  Marland Kitchen VASECTOMY     Family History  Problem Relation Age of Onset  . Atrial fibrillation Father   . Hypertension Father        alive @ 54.  Marland Kitchen Hypothyroidism Mother   . Hypertension Mother        alive @ 93.  . Diabetes Maternal Grandfather   . Diabetes Maternal Aunt   . Colon cancer Maternal Aunt   . Colon cancer Maternal Aunt   . Lung cancer Paternal Uncle   . Lung cancer Maternal Uncle   . Asthma Sister   . Hypothyroidism Daughter   .  Prostate cancer Neg Hx   . CAD Neg Hx    Current Outpatient Prescriptions  Medication Sig Dispense Refill  . acetaminophen (TYLENOL) 500 MG tablet Take 1,000 mg by mouth every 6 (six) hours as needed (pain).    Marland Kitchen allopurinol (ZYLOPRIM) 100 MG tablet Take 1 tablet (100 mg total) by mouth daily. 90 tablet 1  . aspirin EC 81 MG tablet Take 1 tablet (81 mg total) by mouth daily.    Marland Kitchen atorvastatin (LIPITOR) 40 MG tablet Take 1 tablet (40 mg total) by mouth daily. 30 tablet 5  . carvedilol (COREG) 12.5 MG tablet Take 12.5 mg by mouth daily.    . furosemide (LASIX) 20 MG tablet Take 1 tablet (20 mg total) by mouth  daily. 30 tablet 5  . levothyroxine (SYNTHROID, LEVOTHROID) 200 MCG tablet Take 200 mcg by mouth daily before breakfast.  5  . MAGNESIUM PO Take 1 tablet by mouth daily.    . metFORMIN (GLUCOPHAGE) 1000 MG tablet Take 1 tablet (1,000 mg total) by mouth 2 (two) times daily with a meal. 180 tablet 2  . Multiple Vitamin (MULTIVITAMIN WITH MINERALS) TABS tablet Take 1 tablet by mouth daily.    . Omega-3 Fatty Acids (FISH OIL) 1000 MG CAPS Take 1,000 mg by mouth 2 (two) times daily.     . potassium chloride (KLOR-CON M10) 10 MEQ tablet Take 1 tablet (10 mEq total) by mouth daily. 30 tablet 0  . sitaGLIPtin (JANUVIA) 100 MG tablet Take 1 tablet (100 mg total) by mouth daily. 30 tablet 5  . tadalafil (CIALIS) 20 MG tablet Take 1 tablet (20 mg total) by mouth daily as needed for erectile dysfunction. 10 tablet 5  . vitamin E 400 UNIT capsule Take 400 Units by mouth daily.     Allergies  Allergen Reactions  . Losartan Other (See Comments)    Leg cramps  . Ramipril Cough   Social History   Social History  . Marital status: Married   Occupational History  . Warehouse work Medical laboratory scientific officer   Social History Main Topics  . Smoking status: Former Smoker    Packs/day: 2.50    Years: 2.00    Types: Cigarettes    Quit date: 11/29/1970  . Smokeless tobacco: Former Systems developer    Types: Chew  . Alcohol use Yes     Comment: 09/05/2014 "2 drinks q couple months, if that  . Drug use: No  . Sexual activity: Yes   Social History Narrative   Live w/ wife , daughter lives w/ them - in [place]   8 dogs, fish pond.   Job is very physical.   Does not routinely exercise.    Objective: VITAL SIGNS: BP 136/74 (BP Location: Right Arm, Patient Position: Sitting, Cuff Size: Large)   Pulse 61   Temp 98 F (36.7 C) (Oral)   Ht 6\' 1"  (1.854 m)   Wt 259 lb 6.4 oz (117.7 kg)   SpO2 96%   BMI 34.22 kg/m  Constitutional: Well formed, well developed. No acute distress. Cardiovascular: Brisk cap refill Thorax & Lungs: No accessory  muscle use Extremities: No clubbing. No cyanosis. No edema.  Skin: Warm. Dry. No erythema. No rash.  Musculoskeletal: L shoulder.   Normal active range of motion: no.  Decreased horizontal flexion Normal passive range of motion: no, decreased horizontal flexion Tenderness to palpation: yes- over AC joint and trap Deformity: no Ecchymosis: no Tests positive: Cross over, [employer] over [place]rien's Tests negative: Neer's, [employer], liftoff, speeds,  empty can Neurologic: Normal sensory function. No focal deficits noted. DTR's equal and symmetry in UE's. No clonus. Psychiatric: Normal mood. Age appropriate judgment and insight. Alert & oriented x 3.    Procedure Note; Shoulder bursa injection Verbal consent obtained. [facility] was palpated, an area was marked with an otoscope speculum, and cleaned with alcohol x1. A 30-gauge needle was used to enter the joint perpendicularly with ease. 20 mg of Kenalog with 0.5 mL of 1% lidocaine was injected. The patient tolerated the procedure well. There were no complications noted.  Assessment:  AC joint arthropathy - Plan: PR DRAIN/INJECT INTERMEDIATE JOINT/BURSA  Strain of left trapezius muscle, initial encounter  Plan: Orders as above. Sheet. Ice. Anti-inflammatories as needed. Tylenol. Home stretches and exercises given for both before meals joint and trapezius. F/u prn. May need to consider PT vs referral to sports med/ortho. The patient voiced understanding and agreement to the plan.   [place], DO 05/12/17  8:56 AM

## 2017-05-12 NOTE — Progress Notes (Signed)
EPIC Encounter for ICM Monitoring  Patient Name: Mike Jimenez is a 66 y.o. male Date: 05/12/2017 Primary Care Physican: Colon Branch, MD Primary Cardiologist:Taylor Electrophysiologist: Lovena Le Dry Weight:unknown Bi-V Pacing: 98.3%         Attempted call to patient and unable to reach.  Left detailed message regarding transmission.  Transmission reviewed.    Thoracic impedance normal.  Prescribed dosage: Furosemide 20 mg 1 tablets (20 mg total) daily. Potassium 10 mEq 1 tablets (10 mEq total) daily.  Recommendations: Left voice mail with ICM number and encouraged to call for fluid symptoms.  Follow-up plan: ICM clinic phone appointment on 06/14/2017.  He is due for office visit with Dr Lovena Le.  Copy of ICM check sent to device physician.   3 month ICM trend: 05/12/2017   1 Year ICM trend:      Rosalene Billings, RN 05/12/2017 7:49 AM

## 2017-05-12 NOTE — Addendum Note (Signed)
Addended by: Harl Bowie on: 05/12/2017 04:53 PM   Modules accepted: Orders

## 2017-05-12 NOTE — Patient Instructions (Addendum)
Acromioclavicular Separation Rehab Ask your health care provider which exercises are safe for you. Do exercises exactly as told by your health care provider and adjust them as directed. It is normal to feel mild stretching, pulling, tightness, or discomfort as you do these exercises, but you should stop right away if you feel sudden pain or your pain gets worse.Do not begin these exercises until told by your health care provider. Stretching and range of motion exercises These exercises warm up your muscles and joints and improve the movement and flexibility of your shoulder. These exercises also help to relieve pain and stiffness. Exercise A: Pendulum  1. Stand near a wall or a surface that you can hold onto for balance. 2. Bend forward at the waist and let your left / right arm hang straight down. Use your other arm to keep your balance. 3. Relax your arm and shoulder muscles, and move your hips and your trunk so your left / right arm swings freely. Your arm should swing because of the motion of your body, not because you are using your arm or shoulder muscles. 4. Keep moving so your arm swings in the following directions, as told by your health care provider: ? Side to side. ? Forward and backward. ? In clockwise and counterclockwise circles. 5. Slowly return to the starting position. Repeat 2 times, or for 10 seconds per direction. Complete this exercise once a day. Exercise B: Flexion, seated  1. Sit in a stable chair and rest your left / right forearm on a flat surface. Your elbow should rest at a height that keeps your upper arm next to your body. 2. Keeping your left / right shoulder relaxed, lean forward at the waist and slide your hand forward until you feel a stretch in your shoulder. You can move your chair farther away from the surface to increase the stretch, if needed. 3. Hold for 10 seconds. 4. Slowly return to the starting position. Repeat 2 times. Complete this exercise once a  day. Strengthening exercises These exercises build strength and endurance in your shoulder. Endurance is the ability to use your muscles for a long time, even after they get tired. Exercise C: Scapular retraction  1. Sit in a stable chair without armrests, or stand. 2. Secure an exercise band to a stable object in front of you so the band is at shoulder height. 3. Hold one end of the exercise band in each hand. 4. Squeeze your shoulder blades together and move your elbows slightly behind you. Do not shrug your shoulders while you do this. 5. Hold for 3 seconds. 6. Slowly return to the starting position. Repeat 2 times. Complete this exercise every other day. Exercise D: Shoulder abduction 1. Sit in a stable chair without arm rests, or stand. 2. If directed, hold a 5-10 lb weight in your left / right hand. 3. Start with your arms straight down. Turn your left / right hand so your palm faces in, toward your body. 4. Slowly lift your right / left hand out to your side. Do not lift your hand above shoulder height. ? Keep your arms straight. ? Avoid shrugging your shoulder while you do this movement. Keep your shoulder blade tucked down toward the middle of your back. 5. Hold for 3-5 seconds. 6. Slowly lower your arm and return to the starting position. Repeat 2 times. Complete this exercise every other day. Exercise E: Scapular protraction, standing  1. Stand so you are facing a wall. Place  your feet about one arm-length away from the wall. 2. Place your hands on the wall and straighten your elbows. 3. Keep your hands on the wall as you push your upper back away from the wall. You should feel your shoulder blades sliding forward around your rib cage. Keep your elbows and your head still. 4. Hold for 5 seconds. 5. Slowly return to the starting position. Let your muscles relax completely before you repeat this exercise. Repeat 2 times. Complete this exercise once a day. This information is  not intended to replace advice given to you by your health care provider. Make sure you discuss any questions you have with your health care provider. Document Released: 11/15/2005 Document Revised: 07/22/2016 Document Reviewed: 10/18/2015 Elsevier Interactive Patient Education  2018 [employer] Ask your health care provider which exercises are safe for you. Do exercises exactly as told by your health care provider and adjust them as directed. It is normal to feel mild stretching, pulling, tightness, or discomfort as you do these exercises, but you should stop right away if you feel sudden pain or your pain gets worse.Do not begin these exercises until told by your health care provider. Stretching and range of motion exercises These exercises warm up your muscles and joints and improve the movement and flexibility of your shoulder. These exercises can also help to relieve pain, numbness, and tingling. If you are unable to do any of the following for any reason, do not further attempt to do it.  Exercise A: Flexion, standing    6. Stand and hold a broomstick, a cane, or a similar object. Place your hands a little more than shoulder-width apart on the object. Your left / right hand should be palm-up, and your other hand should be palm-down. 7. Push the stick to raise your left / right arm out to your side and then over your head. Use your other hand to help move the stick. Stop when you feel a stretch in your shoulder, or when you reach the angle that is recommended by your health care provider. ? Avoid shrugging your shoulder while you raise your arm. Keep your shoulder blade tucked down toward your spine. 8. Hold for 10-15 seconds. 9. Slowly return to the starting position. Repeat 2-3 times. Complete this exercise 1 time a day.  Exercise B: Abduction, supine    1. Lie on your back and hold a broomstick, a cane, or a similar object. Place your hands a little more than  shoulder-width apart on the object. Your left / right hand should be palm-up, and your other hand should be palm-down. 2. Push the stick to raise your left / right arm out to your side and then over your head. Use your other hand to help move the stick. Stop when you feel a stretch in your shoulder, or when you reach the angle that is recommended by your health care provider. ? Avoid shrugging your shoulder while you raise your arm. Keep your shoulder blade tucked down toward your spine. 3. Hold for 10-15 seconds. 4. Slowly return to the starting position. Repeat 2-3 times. Complete this exercise 1 time  a day.  Exercise C: Flexion, active-assisted    7. Lie on your back. You may bend your knees for comfort. 8. Hold a broomstick, a cane, or a similar object. Place your hands about shoulder-width apart on the object. Your palms should face toward your feet. 9. Raise the stick and move your arms  over your head and behind your head, toward the floor. Use your healthy arm to help your left / right arm move farther. Stop when you feel a gentle stretch in your shoulder, or when you reach the angle where your health care provider tells you to stop. 10. Hold for 10-15 seconds. 11. Slowly return to the starting position. Repeat 2-3 times. Complete this exercise 1 time  a day.  Exercise D: External rotation and abduction    7. Stand in a door frame with one of your feet slightly in front of the other. This is called a staggered stance. 8. Choose one of the following positions as told by your health care provider: ? Place your hands and forearms on the door frame above your head. ? Place your hands and forearms on the door frame at the height of your head. ? Place your hands on the door frame at the height of your elbows. 9. Slowly move your weight onto your front foot until you feel a stretch across your chest and in the front of your shoulders. Keep your head and chest upright and keep your  abdominal muscles tight. 10. Hold for 10-15 seconds. 11. To release the stretch, shift your weight to your back foot. Repeat 2-3 times. Complete this stretch 1 time  a day.  Strengthening exercises These exercises build strength and endurance in your shoulder. Endurance is the ability to use your muscles for a long time, even after your muscles get tired. Exercise E: Scapular depression and adduction  6. Sit on a stable chair. Support your arms in front of you with pillows, armrests, or a tabletop. Keep your elbows in line with the sides of your body. 7. Gently move your shoulder blades down toward your middle back. Relax the muscles on the tops of your shoulders and in the back of your neck. 8. Hold for 10-15 seconds. 9. Slowly release the tension and relax your muscles completely before doing this exercise again. 10. After you have practiced this exercise, try doing the exercise without the arm support. Then, try the exercise while standing instead of sitting. Repeat 2-3 times. Complete this exercise 1 time  a day.  Exercise F: Shoulder abduction, isometric    1. Stand or sit about 4-6 inches (10-15 cm) from a wall with your left / right side facing the wall. 2. Bend your left / right elbow and gently press your elbow against the wall. 3. Increase the pressure slowly until you are pressing as hard as you can without shrugging your shoulder. 4. Hold for 10-15 seconds. 5. Slowly release the tension and relax your muscles completely. Repeat 2-3 times. Complete this exercise 1 time  a day.  Exercise G: Shoulder flexion, isometric    1. Stand or sit about 4-6 inches (10-15 cm) away from a wall with your left / right side facing the wall. 2. Keep your left / right elbow straight and gently press the top of your fist against the wall. Increase the pressure slowly until you are pressing as hard as you can without shrugging your shoulder. 3. Hold for 10-15 seconds. 4. Slowly release the  tension and relax your muscles completely. Repeat 2-3 times. Complete this exercise 1 time  a day.  Exercise H: Internal rotation    1. Sit in a stable chair without armrests, or stand. Secure an exercise band at your left / right side, at elbow height. 2. Place a soft object, such as a folded towel or  a small pillow, under your left / right upper arm so your elbow is a few inches (about 8 cm) away from your side. 3. Hold the end of the exercise band so the band stretches. 4. Keeping your elbow pressed against the soft object under your arm, move your forearm across your body toward your abdomen. Keep your body steady so the movement is only coming from your shoulder. 5. Hold for 10-15 seconds. 6. Slowly return to the starting position. Repeat 2-3 times. Complete this exercise 1 time  a day.  Exercise I: External rotation    1. Sit in a stable chair without armrests, or stand. 2. Secure an exercise band at your left / right side, at elbow height. 3. Place a soft object, such as a folded towel or a small pillow, under your left / right upper arm so your elbow is a few inches (about 8 cm) away from your side. 4. Hold the end of the exercise band so the band stretches. 5. Keeping your elbow pressed against the soft object under your arm, move your forearm out, away from your abdomen. Keep your body steady so the movement is only coming from your shoulder. 6. Hold for 10-15 seconds. 7. Slowly return to the starting position. Repeat 2-3 times. Complete this exercise 1 time  a day. Exercise J: Shoulder extension  1. Sit in a stable chair without armrests, or stand. Secure an exercise band to a stable object in front of you so the band is at shoulder height. 2. Hold one end of the exercise band in each hand. Your palms should face each other. 3. Straighten your elbows and lift your hands up to shoulder height. 4. Step back, away from the secured end of the exercise band, until the band  stretches. 5. Squeeze your shoulder blades together and pull your hands down to the sides of your thighs. Stop when your hands are straight down by your sides. Do not let your hands go behind your body. 6. Hold for 10-15 seconds. 7. Slowly return to the starting position. Repeat 2-3 times. Complete this exercise 1 time  a day.  Exercise K: Shoulder extension, prone    1. Lie on your abdomen on a firm surface so your left / right arm hangs over the edge. 2. Hold a 5 lb weight in your hand so your palm faces in toward your body. Your arm should be straight. 3. Squeeze your shoulder blade down toward the middle of your back. 4. Slowly raise your arm behind you, up to the height of the surface that you are lying on. Keep your arm straight. 5. Hold for 10-15 seconds. 6. Slowly return to the starting position and relax your muscles. Repeat 2-3 times. Complete this exercise 1 time  a day.  Exercise L: Horizontal abduction, prone  1. Lie on your abdomen on a firm surface so your left / right arm hangs over the edge. 2. Hold a 5 lb weight in your hand so your palm faces toward your feet. Your arm should be straight. 3. Squeeze your shoulder blade down toward the middle of your back. 4. Bend your elbow so your hand moves up, until your elbow is bent to an "L" shape (90 degrees). With your elbow bent, slowly move your forearm forward and up. Raise your hand up to the height of the surface that you are lying on. ? Your upper arm should not move, and your elbow should stay bent. ? At the  top of the movement, your palm should face the floor. 5. Hold for 10-15 seconds. 6. Slowly return to the starting position and relax your muscles. Repeat 2-3 times. Complete this exercise 1 time a day.  Exercise M: Horizontal abduction, standing  1. Sit on a stable chair, or stand. 2. Secure an exercise band to a stable object in front of you so the band is at shoulder height. 3. Hold one end of the exercise band  in each hand. 4. Straighten your elbows and lift your hands straight in front of you, up to shoulder height. Your palms should face down, toward the floor. 5. Step back, away from the secured end of the exercise band, until the band stretches. 6. Move your arms out to your sides, and keep your arms straight. 7. Hold for 10-15 seconds. 8. Slowly return to the starting position. Repeat 2-3 times. Complete this exercise 1 time a day.  Exercise N: Scapular retraction and elevation  1. Sit on a stable chair, or stand. 2. Secure an exercise band to a stable object in front of you so the band is at shoulder height. 3. Hold one end of the exercise band in each hand. Your palms should face each other. 4. Sit in a stable chair without armrests, or stand. 5. Step back, away from the secured end of the exercise band, until the band stretches. 6. Squeeze your shoulder blades together and lift your hands over your head. Keep your elbows straight. 7. Hold for 10-15 seconds. 8. Slowly return to the starting position. Repeat 3-4 times. Complete this exercise 1-2 times a day.  This information is not intended to replace advice given to you by your health care provider. Make sure you discuss any questions you have with your health care provider. Document Released: 11/15/2005 Document Revised: 07/22/2016 Document Reviewed: 10/02/2015 Elsevier Interactive Patient Education  2017 [employer].

## 2017-05-21 ENCOUNTER — Other Ambulatory Visit: Payer: Self-pay | Admitting: Internal Medicine

## 2017-06-14 ENCOUNTER — Telehealth: Payer: Self-pay

## 2017-06-14 ENCOUNTER — Ambulatory Visit (INDEPENDENT_AMBULATORY_CARE_PROVIDER_SITE_OTHER): Payer: 59 | Admitting: *Deleted

## 2017-06-14 DIAGNOSIS — I5022 Chronic systolic (congestive) heart failure: Secondary | ICD-10-CM | POA: Diagnosis not present

## 2017-06-14 DIAGNOSIS — I428 Other cardiomyopathies: Secondary | ICD-10-CM | POA: Diagnosis not present

## 2017-06-14 DIAGNOSIS — Z95810 Presence of automatic (implantable) cardiac defibrillator: Secondary | ICD-10-CM

## 2017-06-14 NOTE — Telephone Encounter (Signed)
Remote ICM transmission received.  Attempted patient call and left detailed message regarding transmission and next ICM scheduled for 07/15/2017.  Advised to return call for any fluid symptoms or questions.

## 2017-06-14 NOTE — Progress Notes (Signed)
EPIC Encounter for ICM Monitoring  Patient Name: Mike Jimenez is a 66 y.o. male Date: 06/14/2017 Primary Care Physican: Colon Branch, MD Primary Cardiologist:Taylor Electrophysiologist: Druscilla Brownie Weight:unknown Bi-V Pacing: 98.2%        Attempted call to patient and unable to reach.  Left detailed message regarding transmission.  Transmission reviewed.    Thoracic impedance normal.  Prescribed dosage: Furosemide 20 mg 1tablets (20 mg total) daily. Potassium 10 mEq 1tablets (10 mEq total) daily.  Recommendations: Left voice mail with ICM number and encouraged to call for fluid symptoms.  Follow-up plan: ICM clinic phone appointment on 07/15/2017.    Copy of ICM check sent to device physician.   3 month ICM trend: 06/14/2017   1 Year ICM trend:      Rosalene Billings, RN 06/14/2017 1:13 PM

## 2017-06-14 NOTE — Progress Notes (Signed)
Remote ICD transmission received.

## 2017-06-15 ENCOUNTER — Encounter: Payer: Self-pay | Admitting: Cardiology

## 2017-06-15 ENCOUNTER — Other Ambulatory Visit: Payer: Self-pay | Admitting: Internal Medicine

## 2017-06-16 LAB — CUP PACEART REMOTE DEVICE CHECK
Battery Remaining Longevity: 100 mo
Battery Voltage: 3.01 V
Brady Statistic AP VP Percent: 1.93 %
Brady Statistic RA Percent Paced: 1.97 %
Brady Statistic RV Percent Paced: 52.25 %
Date Time Interrogation Session: 20180717071804
HighPow Impedance: 43 Ohm
HighPow Impedance: 55 Ohm
Implantable Lead Implant Date: 20110516
Implantable Lead Implant Date: 20110516
Implantable Lead Location: 753858
Implantable Lead Location: 753859
Implantable Lead Model: 5076
Implantable Lead Model: 6947
Lead Channel Impedance Value: 399 Ohm
Lead Channel Impedance Value: 817 Ohm
Lead Channel Pacing Threshold Amplitude: 0.875 V
Lead Channel Pacing Threshold Amplitude: 1.125 V
Lead Channel Pacing Threshold Pulse Width: 0.4 ms
Lead Channel Pacing Threshold Pulse Width: 0.4 ms
Lead Channel Sensing Intrinsic Amplitude: 2.5 mV
Lead Channel Sensing Intrinsic Amplitude: 2.5 mV
Lead Channel Setting Pacing Amplitude: 2.25 V
Lead Channel Setting Sensing Sensitivity: 0.3 mV
MDC IDC LEAD IMPLANT DT: 20110516
MDC IDC LEAD LOCATION: 753860
MDC IDC MSMT LEADCHNL LV IMPEDANCE VALUE: 399 Ohm
MDC IDC MSMT LEADCHNL LV IMPEDANCE VALUE: 551 Ohm
MDC IDC MSMT LEADCHNL LV PACING THRESHOLD AMPLITUDE: 1 V
MDC IDC MSMT LEADCHNL LV PACING THRESHOLD PULSEWIDTH: 0.4 ms
MDC IDC MSMT LEADCHNL RV IMPEDANCE VALUE: 266 Ohm
MDC IDC MSMT LEADCHNL RV IMPEDANCE VALUE: 342 Ohm
MDC IDC MSMT LEADCHNL RV SENSING INTR AMPL: 10.125 mV
MDC IDC MSMT LEADCHNL RV SENSING INTR AMPL: 10.125 mV
MDC IDC PG IMPLANT DT: 20170623
MDC IDC SET LEADCHNL LV PACING AMPLITUDE: 2 V
MDC IDC SET LEADCHNL LV PACING PULSEWIDTH: 0.4 ms
MDC IDC SET LEADCHNL RV PACING AMPLITUDE: 2.5 V
MDC IDC SET LEADCHNL RV PACING PULSEWIDTH: 0.4 ms
MDC IDC STAT BRADY AP VS PERCENT: 0.05 %
MDC IDC STAT BRADY AS VP PERCENT: 96.53 %
MDC IDC STAT BRADY AS VS PERCENT: 1.49 %

## 2017-06-20 ENCOUNTER — Other Ambulatory Visit: Payer: Self-pay | Admitting: Internal Medicine

## 2017-07-02 ENCOUNTER — Other Ambulatory Visit: Payer: Self-pay | Admitting: Internal Medicine

## 2017-07-15 ENCOUNTER — Ambulatory Visit (INDEPENDENT_AMBULATORY_CARE_PROVIDER_SITE_OTHER): Payer: 59

## 2017-07-15 ENCOUNTER — Telehealth: Payer: Self-pay

## 2017-07-15 DIAGNOSIS — I5022 Chronic systolic (congestive) heart failure: Secondary | ICD-10-CM

## 2017-07-15 DIAGNOSIS — Z95810 Presence of automatic (implantable) cardiac defibrillator: Secondary | ICD-10-CM | POA: Diagnosis not present

## 2017-07-15 NOTE — Progress Notes (Signed)
EPIC Encounter for ICM Monitoring  Patient Name: WASYL DORNFELD is a 66 y.o. male Date: 07/15/2017 Primary Care Physican: Colon Branch, MD Primary Cardiologist:Taylor Electrophysiologist: Lovena Le Dry Weight:unknown Bi-V Pacing: 98.2%       Attempted call to patient and unable to reach.  Left detailed message regarding transmission.  Transmission reviewed.    Thoracic impedance normal.  Prescribed dosage: Furosemide 20 mg 1tablets (20 mg total) daily. Potassium 10 mEq 1tablets (10 mEq total) daily.  Recommendations: Left voice mail with ICM number and encouraged to call for fluid symptoms.  Follow-up plan: ICM clinic phone appointment on 08/15/2017.   Copy of ICM check sent to device physician.   3 month ICM trend: 07/15/2017   1 Year ICM trend:      Rosalene Billings, RN 07/15/2017 10:07 AM

## 2017-07-15 NOTE — Telephone Encounter (Signed)
Remote ICM transmission received.  Attempted patient call and left detailed message regarding transmission and next ICM scheduled for 08/15/2017.  Advised to return call for any fluid symptoms or questions.    

## 2017-07-24 ENCOUNTER — Encounter: Payer: Self-pay | Admitting: Internal Medicine

## 2017-08-06 ENCOUNTER — Other Ambulatory Visit: Payer: Self-pay | Admitting: Internal Medicine

## 2017-08-15 ENCOUNTER — Telehealth: Payer: Self-pay

## 2017-08-15 ENCOUNTER — Ambulatory Visit (INDEPENDENT_AMBULATORY_CARE_PROVIDER_SITE_OTHER): Payer: 59

## 2017-08-15 DIAGNOSIS — Z95810 Presence of automatic (implantable) cardiac defibrillator: Secondary | ICD-10-CM

## 2017-08-15 DIAGNOSIS — I5022 Chronic systolic (congestive) heart failure: Secondary | ICD-10-CM

## 2017-08-15 NOTE — Progress Notes (Signed)
EPIC Encounter for ICM Monitoring  Patient Name: Mike Jimenez is a 66 y.o. male Date: 08/15/2017 Primary Care Physican: Colon Branch, MD Primary Cardiologist:Taylor Electrophysiologist: Druscilla Brownie Weight:unknown Bi-V Pacing: 98.2%      Attempted call to patient and unable to reach.  Transmission reviewed.    Thoracic impedance normal.  Prescribed dosage: Furosemide 20 mg 1tablet (20 mg total) daily. Potassium 10 mEq 1tablet (10 mEq total) daily.  Recommendations: NONE - Unable to reach patient   Follow-up plan: ICM clinic phone appointment on 09/15/2017.   Over due to make office visit with Dr Lovena Le.  Last EP visit was 07/19/2016.  Copy of ICM check sent to Dr. Lovena Le.   3 month ICM trend: 08/15/2017   1 Year ICM trend:      Rosalene Billings, RN 08/15/2017 10:12 AM

## 2017-08-15 NOTE — Telephone Encounter (Signed)
Remote ICM transmission received.  Attempted call to patient and no answer or voice mail box.

## 2017-08-24 ENCOUNTER — Other Ambulatory Visit: Payer: Self-pay | Admitting: Internal Medicine

## 2017-08-24 ENCOUNTER — Encounter: Payer: Self-pay | Admitting: Internal Medicine

## 2017-08-25 ENCOUNTER — Other Ambulatory Visit: Payer: Self-pay | Admitting: Internal Medicine

## 2017-09-07 ENCOUNTER — Other Ambulatory Visit: Payer: Self-pay | Admitting: Internal Medicine

## 2017-09-15 ENCOUNTER — Telehealth: Payer: Self-pay

## 2017-09-15 ENCOUNTER — Ambulatory Visit (INDEPENDENT_AMBULATORY_CARE_PROVIDER_SITE_OTHER): Payer: 59 | Admitting: *Deleted

## 2017-09-15 DIAGNOSIS — Z95810 Presence of automatic (implantable) cardiac defibrillator: Secondary | ICD-10-CM | POA: Diagnosis not present

## 2017-09-15 DIAGNOSIS — I5022 Chronic systolic (congestive) heart failure: Secondary | ICD-10-CM | POA: Diagnosis not present

## 2017-09-15 DIAGNOSIS — I428 Other cardiomyopathies: Secondary | ICD-10-CM

## 2017-09-15 NOTE — Telephone Encounter (Signed)
Remote ICM transmission received.  Attempted call to patient and no answer   

## 2017-09-15 NOTE — Progress Notes (Signed)
Remote ICD transmission.   

## 2017-09-15 NOTE — Progress Notes (Signed)
EPIC Encounter for ICM Monitoring  Patient Name: Mike Jimenez is a 66 y.o. male Date: 09/15/2017 Primary Care Physican: Colon Branch, MD Primary Cardiologist:Taylor Electrophysiologist: Druscilla Brownie Weight:unknown Bi-V Pacing: 98%       Attempted call to patient and unable to reach.   Transmission reviewed.    Thoracic impedance normal.  Prescribed dosage: Furosemide 20 mg 1tablet (20 mg total) daily. Potassium 10 mEq 1tablet (10 mEq total) daily.  Recommendations: NONE - Unable to reach patient   Follow-up plan: ICM clinic phone appointment on 10/17/2017.  Over due to make office visit with Dr Lovena Le.  Last EP visit was 07/19/2016.  Copy of ICM check sent to Dr. Lovena Le.   3 month ICM trend: 09/15/2017   1 Year ICM trend:      Rosalene Billings, RN 09/15/2017 1:17 PM

## 2017-09-22 ENCOUNTER — Encounter: Payer: Self-pay | Admitting: Cardiology

## 2017-09-23 LAB — CUP PACEART REMOTE DEVICE CHECK
Battery Voltage: 3 V
Brady Statistic AP VS Percent: 0.06 %
Brady Statistic AS VP Percent: 95.97 %
HIGH POWER IMPEDANCE MEASURED VALUE: 55 Ohm
HighPow Impedance: 43 Ohm
Implantable Lead Implant Date: 20110516
Implantable Lead Implant Date: 20110516
Implantable Lead Location: 753858
Implantable Lead Location: 753859
Implantable Lead Model: 6947
Implantable Pulse Generator Implant Date: 20170623
Lead Channel Impedance Value: 399 Ohm
Lead Channel Impedance Value: 551 Ohm
Lead Channel Pacing Threshold Pulse Width: 0.4 ms
Lead Channel Pacing Threshold Pulse Width: 0.4 ms
Lead Channel Pacing Threshold Pulse Width: 0.4 ms
Lead Channel Sensing Intrinsic Amplitude: 13 mV
Lead Channel Sensing Intrinsic Amplitude: 13 mV
Lead Channel Sensing Intrinsic Amplitude: 2.125 mV
Lead Channel Sensing Intrinsic Amplitude: 2.125 mV
Lead Channel Setting Pacing Amplitude: 2 V
Lead Channel Setting Pacing Amplitude: 2.5 V
Lead Channel Setting Pacing Amplitude: 2.5 V
Lead Channel Setting Pacing Pulse Width: 0.4 ms
MDC IDC LEAD IMPLANT DT: 20110516
MDC IDC LEAD LOCATION: 753860
MDC IDC MSMT BATTERY REMAINING LONGEVITY: 96 mo
MDC IDC MSMT LEADCHNL LV IMPEDANCE VALUE: 399 Ohm
MDC IDC MSMT LEADCHNL LV IMPEDANCE VALUE: 836 Ohm
MDC IDC MSMT LEADCHNL LV PACING THRESHOLD AMPLITUDE: 1 V
MDC IDC MSMT LEADCHNL RA PACING THRESHOLD AMPLITUDE: 1.25 V
MDC IDC MSMT LEADCHNL RV IMPEDANCE VALUE: 266 Ohm
MDC IDC MSMT LEADCHNL RV IMPEDANCE VALUE: 323 Ohm
MDC IDC MSMT LEADCHNL RV PACING THRESHOLD AMPLITUDE: 0.875 V
MDC IDC SESS DTM: 20181018073522
MDC IDC SET LEADCHNL RV PACING PULSEWIDTH: 0.4 ms
MDC IDC SET LEADCHNL RV SENSING SENSITIVITY: 0.3 mV
MDC IDC STAT BRADY AP VP PERCENT: 2.34 %
MDC IDC STAT BRADY AS VS PERCENT: 1.63 %
MDC IDC STAT BRADY RA PERCENT PACED: 2.4 %
MDC IDC STAT BRADY RV PERCENT PACED: 53.54 %

## 2017-09-26 LAB — HM DIABETES EYE EXAM

## 2017-10-03 LAB — MICROALBUMIN, URINE: MICROALB UR: 21.21

## 2017-10-03 LAB — TSH: TSH: 0.84 (ref 0.41–5.90)

## 2017-10-03 LAB — HEMOGLOBIN A1C: HEMOGLOBIN A1C: 6.9

## 2017-10-10 ENCOUNTER — Other Ambulatory Visit: Payer: Self-pay | Admitting: Internal Medicine

## 2017-10-17 ENCOUNTER — Ambulatory Visit (INDEPENDENT_AMBULATORY_CARE_PROVIDER_SITE_OTHER): Payer: 59

## 2017-10-17 ENCOUNTER — Telehealth: Payer: Self-pay

## 2017-10-17 DIAGNOSIS — I5022 Chronic systolic (congestive) heart failure: Secondary | ICD-10-CM | POA: Diagnosis not present

## 2017-10-17 DIAGNOSIS — Z95810 Presence of automatic (implantable) cardiac defibrillator: Secondary | ICD-10-CM | POA: Diagnosis not present

## 2017-10-17 NOTE — Telephone Encounter (Signed)
Remote ICM transmission received.  Attempted call to patient and left detailed message per patient's DPR regarding transmission and next ICM scheduled for 11/17/2017.  Advised to return call for any fluid symptoms or questions.

## 2017-10-17 NOTE — Progress Notes (Signed)
EPIC Encounter for ICM Monitoring  Patient Name: Mike Jimenez is a 66 y.o. male Date: 10/17/2017 Primary Care Physican: Colon Branch, MD Primary Cardiologist:Taylor Electrophysiologist: Druscilla Brownie Weight:unknown Bi-V Pacing: 98%                                        Attempted call to patient and unable to reach.   Transmission reviewed.    Thoracic impedance normal.  Prescribed dosage: Furosemide 20 mg 1tablet (20 mg total) daily. Potassium 10 mEq 1tablet (10 mEq total) daily.  Recommendations: NONE - Unable to reach patient   Follow-up plan: ICM clinic phone appointment on 11/17/2017.    Copy of ICM check sent to Dr. Lovena Le.   3 month ICM trend: 10/17/2017    1 Year ICM trend:       Rosalene Billings, RN 10/17/2017 10:43 AM

## 2017-10-18 ENCOUNTER — Other Ambulatory Visit: Payer: Self-pay | Admitting: Internal Medicine

## 2017-10-27 ENCOUNTER — Other Ambulatory Visit: Payer: Self-pay | Admitting: Internal Medicine

## 2017-11-16 ENCOUNTER — Other Ambulatory Visit: Payer: Self-pay | Admitting: Internal Medicine

## 2017-11-17 ENCOUNTER — Ambulatory Visit (INDEPENDENT_AMBULATORY_CARE_PROVIDER_SITE_OTHER): Payer: Self-pay

## 2017-11-17 DIAGNOSIS — Z95810 Presence of automatic (implantable) cardiac defibrillator: Secondary | ICD-10-CM

## 2017-11-17 DIAGNOSIS — I5022 Chronic systolic (congestive) heart failure: Secondary | ICD-10-CM

## 2017-11-18 ENCOUNTER — Ambulatory Visit: Payer: 59 | Admitting: Nurse Practitioner

## 2017-11-18 ENCOUNTER — Encounter: Payer: Self-pay | Admitting: Nurse Practitioner

## 2017-11-18 VITALS — BP 126/70 | HR 72 | Ht 73.0 in | Wt 260.3 lb

## 2017-11-18 DIAGNOSIS — I10 Essential (primary) hypertension: Secondary | ICD-10-CM | POA: Diagnosis not present

## 2017-11-18 DIAGNOSIS — I5022 Chronic systolic (congestive) heart failure: Secondary | ICD-10-CM | POA: Diagnosis not present

## 2017-11-18 NOTE — Patient Instructions (Signed)
Medication Instructions: Your physician recommends that you continue on your current medications as directed. Please refer to the Current Medication list given to you today.   Labwork: None ordered  Procedures/Testing: None ordered  Follow-Up: Remote monitoring is used to monitor your Pacemaker of ICD from home. This monitoring reduces the number of office visits required to check your device to one time per year. It allows [place] to keep an eye on the functioning of your device to ensure it is working properly. You are scheduled for a device check from home on 12/15/2017. You may send your transmission at any time that day. If you have a wireless device, the transmission will be sent automatically. After your physician reviews your transmission, you will receive a postcard with your next transmission date.     Your physician wants you to follow-up in: 1 year with Dr. Lovena Le.  You will receive a reminder letter in the mail two months in advance. If you don't receive a letter, please call our office to schedule the follow-up appointment.   Any Additional Special Instructions Will Be Listed Below (If Applicable).     If you need a refill on your cardiac medications before your next appointment, please call your pharmacy.

## 2017-11-18 NOTE — Progress Notes (Signed)
Electrophysiology Office Note Date: 11/18/2017  ID:  Mike Jimenez, Mike Jimenez 15-Dec-1950, MRN [id number]  PCP: Mike Branch, MD Electrophysiologist: Mike Jimenez  CC: Routine ICD follow-up  Mike Jimenez is a 66 y.o. male seen today for Mike Jimenez.  He presents today for routine electrophysiology followup.  Since last being seen in our clinic, the patient reports doing very well. He denies chest pain, palpitations, dyspnea, PND, orthopnea, nausea, vomiting, dizziness, syncope, edema, weight gain, or early satiety.  He has not had ICD shocks.   Device History: MDT CRTD implanted 2011 for NICM, CHF; gen change 2017 History of appropriate therapy: No History of AAD therapy: No   Past Medical History:  Diagnosis Date  . Anemia   . Arthritis    "knees" (09/05/2014)  . Automatic implantable cardioverter-defibrillator in situ   . CAD (coronary artery disease)    a. Mild nonobstructive CAD by cath 08/2014.  Marland Kitchen CHF (congestive heart failure) ([place])   . Childhood asthma   . Gastric ulcer 2011  . GERD (gastroesophageal reflux disease)   . History of gout   . Hyperlipidemia   . Hypertension   . LBBB (left bundle Jimenez block)   . NICM (nonischemic cardiomyopathy) ([place])    a. Dx 2011 - presented with sustained VT. Normal coronaries at that time, EF 10-15% by cath and 20-25% by echo. b. s/p biventricular ICD 2011. c. CP 08/2014: mild nonobstructive CAD.  Marland Kitchen Obesity   . Postsurgical hypothyroidism   . Thyroid cancer ([place])    a. 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 Mike Mike Jimenez. s/p thyroidectomy 2013.  . Torn meniscus   . Type II diabetes mellitus ([place])   . Ventricular tachycardia ([place])    a. Sustained VT 2011 s/p MDT [id number] Concert BiV ICD, ser# [id number].   Past Surgical History:  Procedure Laterality Date  . BI-VENTRICULAR IMPLANTABLE CARDIOVERTER DEFIBRILLATOR  (CRT-D)  03/2010  . CARDIAC CATHETERIZATION  03/2010  . EP IMPLANTABLE DEVICE N/A 05/21/2016   Procedure:  ICD Generator  Changeout;  Surgeon: Evans Lance, MD;  Location: [facility];  Service: Cardiovascular;  Laterality: N/A;  . LEFT HEART CATHETERIZATION WITH CORONARY ANGIOGRAM N/A 09/06/2014   Procedure: LEFT HEART CATHETERIZATION WITH CORONARY ANGIOGRAM;  Surgeon: Burnell Blanks, MD;  Location: [facility];  Service: Cardiovascular;  Laterality: N/A;  . PTX     traumatic, age 22 months  . THYROIDECTOMY  12/10/2011   Procedure: TOTAL THYROIDECTOMY WITH CENTRAL COMPARTMENT DISSECTION;  Surgeon: Earnstine Regal, MD;  Location: [facility];  Service: General;  Laterality: N/A;  Total thyroidectomy with limited central compartment lymph node dissection.  Marland Kitchen VASECTOMY      Current Outpatient Medications  Medication Sig Dispense Refill  . acetaminophen (TYLENOL) 500 MG tablet Take 1,000 mg by mouth every 6 (six) hours as needed (pain).    Marland Kitchen allopurinol (ZYLOPRIM) 100 MG tablet Take 1 tablet (100 mg total) by mouth daily. 90 tablet 0  . aspirin EC 81 MG tablet Take 1 tablet (81 mg total) by mouth daily.    Marland Kitchen atorvastatin (LIPITOR) 40 MG tablet Take 1 tablet (40 mg total) by mouth daily. 30 tablet 2  . carvedilol (COREG) 12.5 MG tablet Take 12.5 mg by mouth daily.    . carvedilol (COREG) 12.5 MG tablet Take one (1) tablet (12.5 mg) by mouth twice daily. Please keep 12/21 at 10:40 am appointment for additional refills. 60 tablet 1  . furosemide (LASIX) 20 MG tablet  Take 1 tablet (20 mg total) by mouth daily. 30 tablet 5  . levothyroxine (SYNTHROID, LEVOTHROID) 200 MCG tablet Take 200 mcg by mouth daily before breakfast.  5  . MAGNESIUM PO Take 1 tablet by mouth daily.    . metFORMIN (GLUCOPHAGE) 1000 MG tablet Take 1 tablet (1,000 mg total) by mouth 2 (two) times daily with a meal. 180 tablet 1  . Multiple Vitamin (MULTIVITAMIN WITH MINERALS) TABS tablet Take 1 tablet by mouth daily.    . Omega-3 Fatty Acids (FISH OIL) 1000 MG CAPS Take 1,000 mg by mouth 2 (two) times daily.     . potassium chloride (KLOR-CON  M10) 10 MEQ tablet Take 1 tablet (10 mEq total) by mouth daily. 30 tablet 0  . sitaGLIPtin (JANUVIA) 100 MG tablet Take 1 tablet (100 mg total) by mouth daily. 30 tablet 1  . tadalafil (CIALIS) 20 MG tablet Take 1 tablet (20 mg total) by mouth daily as needed for erectile dysfunction. 10 tablet 5  . vitamin E 400 UNIT capsule Take 400 Units by mouth daily.     No current facility-administered medications for this visit.     Allergies:   Losartan and Ramipril   Social History: Social History   Socioeconomic History  . Marital status: Married    Spouse name: Not on file  . Number of children: 2  . Years of education: Not on file  . Highest education level: Not on file  Social Needs  . Financial resource strain: Not on file  . Food insecurity - worry: Not on file  . Food insecurity - inability: Not on file  . Transportation needs - medical: Not on file  . Transportation needs - non-medical: Not on file  Occupational History  . Occupation: Warehouse work    Fish farm manager: [employer]  Tobacco Use  . Smoking status: Former Smoker    Packs/day: 2.50    Years: 2.00    Pack years: 5.00    Types: Cigarettes    Last attempt to quit: 11/29/1970    Years since quitting: 47.0  . Smokeless tobacco: Former Systems developer    Types: Chew  Substance and Sexual Activity  . Alcohol use: Yes    Comment: 09/05/2014 "2 drinks q couple months, if that  . Drug use: No  . Sexual activity: Yes  Other Topics Concern  . Not on file  Social History Narrative   Live w/ wife , daughter lives w/ them - in [place]   8 dogs, fish pond.   Job is very physical.   Does not routinely exercise.    Family History: Family History  Problem Relation Age of Onset  . Atrial fibrillation Father   . Hypertension Father        alive @ 70.  Marland Kitchen Hypothyroidism Mother   . Hypertension Mother        alive @ 6.  . Diabetes Maternal Grandfather   . Diabetes Maternal Aunt   . Mike cancer Maternal Aunt   . Mike cancer Maternal Aunt   .  Lung cancer Paternal Uncle   . Lung cancer Maternal Uncle   . Asthma Sister   . Hypothyroidism Daughter   . Prostate cancer Neg Hx   . CAD Neg Hx     Review of Systems: All other systems reviewed and are otherwise negative except as noted above.   Physical Exam: VS:  BP 126/70   Pulse 72   Ht 6\' 1"  (1.854 m)   Wt 260 lb  4.8 oz (118.1 kg)   SpO2 96%   BMI 34.34 kg/m  , BMI Body mass index is 34.34 kg/m.  GEN- The patient is well appearing, alert and oriented x 3 today.   HEENT: normocephalic, atraumatic; sclera clear, conjunctiva pink; hearing intact; oropharynx clear; neck supple Lungs- Clear to ausculation bilaterally, normal work of breathing.  No wheezes, rales, rhonchi Heart- Regular rate and rhythm (paced) GI- soft, non-tender, non-distended, bowel sounds present Extremities- no clubbing, cyanosis, or edema MS- no significant deformity or atrophy Skin- warm and dry, no rash or lesion; ICD pocket well healed Psych- euthymic mood, full affect Neuro- strength and sensation are intact  ICD interrogation- reviewed in detail today,  See PACEART report  EKG:  EKG is not ordered today.  Recent Labs: 04/11/2017: ALT 13; BUN 13; Creatinine, Ser 0.99; Hemoglobin 13.0; Platelets 232.0; Potassium 4.0; Sodium 141   Wt Readings from Last 3 Encounters:  11/18/17 260 lb 4.8 oz (118.1 kg)  05/12/17 259 lb 6.4 oz (117.7 kg)  04/11/17 256 lb 6 oz (116.3 kg)     Other studies Reviewed: Additional studies/ records that were reviewed today include: Mike Tanna Furry office notes   Assessment and Plan:  1.  Chronic systolic dysfunction euvolemic today Stable on an appropriate medical regimen Normal ICD function See Pace Art report No changes today  2.  HTN Stable No change required today    Current medicines are reviewed at length with the patient today.   The patient does not have concerns regarding his medicines.  The following changes were made today:  none  Labs/ tests  ordered today include: none No orders of the defined types were placed in this encounter.    Disposition:   Follow up with [facility], ICM clinic, Mike Jimenez 1 year    Signed, Chanetta Marshall, NP 11/18/2017 11:23 AM  [address] [place] [address] [phone] (office) [phone] (fax)

## 2017-11-18 NOTE — Progress Notes (Signed)
EPIC Encounter for ICM Monitoring  Patient Name: Mike Jimenez is a 66 y.o. male Date: 11/18/2017 Primary Care Physican: Colon Branch, MD Primary Cardiologist:Taylor Electrophysiologist: Lovena Le Dry Weight:unknown Bi-V Pacing: 98.1%      Transmission received.  Patient seen by Chanetta Marshall, NP in office today   Thoracic impedance normal.  Prescribed dosage: Furosemide 20 mg 1tablet (20 mg total) daily. Potassium 10 mEq 1tablet (10 mEq total) daily  Recommendations: None.  Follow-up plan: ICM clinic phone appointment on 12/20/2017.    Copy of ICM check sent to Dr. Lovena Le.   3 month ICM trend: 11/17/2017   1 Year ICM trend:       Rosalene Billings, RN 11/18/2017 11:32 AM

## 2017-11-20 ENCOUNTER — Emergency Department (HOSPITAL_COMMUNITY)
Admission: EM | Admit: 2017-11-20 | Discharge: 2017-11-20 | Disposition: A | Discharge status: Home / Self Care | Payer: 59 | Attending: Emergency Medicine | Admitting: Emergency Medicine

## 2017-11-20 ENCOUNTER — Encounter (HOSPITAL_COMMUNITY): Payer: Self-pay | Admitting: Emergency Medicine

## 2017-11-20 DIAGNOSIS — R4781 Slurred speech: Secondary | ICD-10-CM | POA: Diagnosis present

## 2017-11-20 DIAGNOSIS — I5022 Chronic systolic (congestive) heart failure: Secondary | ICD-10-CM | POA: Diagnosis not present

## 2017-11-20 DIAGNOSIS — I2510 Atherosclerotic heart disease of native coronary artery without angina pectoris: Secondary | ICD-10-CM | POA: Diagnosis not present

## 2017-11-20 DIAGNOSIS — Z87891 Personal history of nicotine dependence: Secondary | ICD-10-CM | POA: Diagnosis not present

## 2017-11-20 DIAGNOSIS — F10920 Alcohol use, unspecified with intoxication, uncomplicated: Secondary | ICD-10-CM

## 2017-11-20 DIAGNOSIS — Z79899 Other long term (current) drug therapy: Secondary | ICD-10-CM | POA: Diagnosis not present

## 2017-11-20 DIAGNOSIS — Z951 Presence of aortocoronary bypass graft: Secondary | ICD-10-CM | POA: Insufficient documentation

## 2017-11-20 DIAGNOSIS — E119 Type 2 diabetes mellitus without complications: Secondary | ICD-10-CM | POA: Insufficient documentation

## 2017-11-20 DIAGNOSIS — I110 Hypertensive heart disease with heart failure: Secondary | ICD-10-CM | POA: Insufficient documentation

## 2017-11-20 DIAGNOSIS — Z7984 Long term (current) use of oral hypoglycemic drugs: Secondary | ICD-10-CM | POA: Diagnosis not present

## 2017-11-20 LAB — I-STAT CHEM 8, ED
BUN: 12 mg/dL (ref 6–20)
CALCIUM ION: 1.07 mmol/L — AB (ref 1.15–1.40)
CREATININE: 1 mg/dL (ref 0.61–1.24)
Chloride: 106 mmol/L (ref 101–111)
GLUCOSE: 265 mg/dL — AB (ref 65–99)
HCT: 41 % (ref 39.0–52.0)
HEMOGLOBIN: 13.9 g/dL (ref 13.0–17.0)
POTASSIUM: 4 mmol/L (ref 3.5–5.1)
Sodium: 145 mmol/L (ref 135–145)
TCO2: 25 mmol/L (ref 22–32)

## 2017-11-20 LAB — CBC WITH DIFFERENTIAL/PLATELET
BASOS ABS: 0 10*3/uL (ref 0.0–0.1)
Basophils Relative: 1 %
EOS PCT: 2 %
Eosinophils Absolute: 0.1 10*3/uL (ref 0.0–0.7)
HCT: 40.1 % (ref 39.0–52.0)
Hemoglobin: 13.6 g/dL (ref 13.0–17.0)
LYMPHS ABS: 0.8 10*3/uL (ref 0.7–4.0)
LYMPHS PCT: 19 %
MCH: 30.9 pg (ref 26.0–34.0)
MCHC: 33.9 g/dL (ref 30.0–36.0)
MCV: 91.1 fL (ref 78.0–100.0)
MONO ABS: 0.2 10*3/uL (ref 0.1–1.0)
Monocytes Relative: 6 %
Neutro Abs: 2.9 10*3/uL (ref 1.7–7.7)
Neutrophils Relative %: 72 %
PLATELETS: 221 10*3/uL (ref 150–400)
RBC: 4.4 MIL/uL (ref 4.22–5.81)
RDW: 12.7 % (ref 11.5–15.5)
WBC: 4 10*3/uL (ref 4.0–10.5)

## 2017-11-20 NOTE — ED Notes (Signed)
Bed: [facility] Expected date:  Expected time:  Means of arrival:  Comments: 53 m etoh

## 2017-11-20 NOTE — ED Triage Notes (Signed)
Pt found in BR at home intoxicated family thought he was having a stroke d/t slurred speech. EMS arrived and NIH negative VS BP: 148/74 HR 68 resp: 20 CBG 251 Pt alert to person and and place

## 2017-11-20 NOTE — Discharge Instructions (Signed)
As discussed, we suspect that your slurred speech was likely due to alcohol intoxication and not TIA. However, you do have several risk factors for TIA.  Please read the instructions provided. Ensure that you are around people over the next 48 hours and if there is any new strokelike symptoms, such as slurred speech, weakness on one side or numbness on one side, return to the ER immediately.

## 2017-11-20 NOTE — ED Provider Notes (Signed)
[facility] DEPT Provider Note   CSN: [id number] Arrival date & time: 11/20/17  0402     History   Chief Complaint Chief Complaint  Patient presents with  . Alcohol Intoxication    HPI Mike Jimenez is a 66 y.o. male.  HPI Patient comes in with chief complaint of slurred speech. Patient has history of CAD, CHF, left bundle branch block status post pacemaker placement, diabetes.  Patient's wife woke up in the middle the night, and saw that her husband was in the living room, with slurred speech.  The wife got concerned that patient might have suffered a stroke and decided to bring him into the ER.  Over time patient's slurring is improved.  Patient is rather frustrated that he is in the ER.  He reports that he got home after midnight with his family.  Patient lost his father last week, and decided to drink alcohol while remembering his father.  Patient had 3 stiff drinks.  He denies any numbness, tingling, weakness, vision changes, dizziness.  Family reports that over time patient's speech has improved on its own.  They now believe that patient likely was intoxicated.  Past Medical History:  Diagnosis Date  . Anemia   . Arthritis    "knees" (09/05/2014)  . Automatic implantable cardioverter-defibrillator in situ   . CAD (coronary artery disease)    a. Mild nonobstructive CAD by cath 08/2014.  Marland Kitchen CHF (congestive heart failure) ([place])   . Childhood asthma   . Gastric ulcer 2011  . GERD (gastroesophageal reflux disease)   . History of gout   . Hyperlipidemia   . Hypertension   . LBBB (left bundle branch block)   . NICM (nonischemic cardiomyopathy) ([place])    a. Dx 2011 - presented with sustained VT. Normal coronaries at that time, EF 10-15% by cath and 20-25% by echo. b. s/p biventricular ICD 2011. c. CP 08/2014: mild nonobstructive CAD.  Marland Kitchen Obesity   . Postsurgical hypothyroidism   . Thyroid cancer ([place])    a. Papillary thyroid carcinoma (3 foci)  w/o metastases. Dr Harlow Asa. s/p thyroidectomy 2013.  . Torn meniscus   . Type II diabetes mellitus ([place])   . Ventricular tachycardia ([place])    a. Sustained VT 2011 s/p MDT [id number] Concert BiV ICD, ser# [id number].    Patient Active Problem List   Diagnosis Date Noted  . PCP NOTES >>>>>>>>>>>>>>>>>>>>> 03/29/2016  . Acute on chronic systolic HF (heart failure) ([place])   . History of gout 12/11/2014  . CAD (coronary artery disease)   . Chest pain 09/05/2014  . Pre-syncope 09/05/2014  . Dehydration 09/05/2014  . Ventricular tachycardia ([place])   . NICM (nonischemic cardiomyopathy) ([place])   . LBBB (left bundle branch block)   . Hypertension   . Hyperlipidemia   . Annual physical exam 01/23/2014  . Chronic systolic CHF (congestive heart failure) ([place]) 06/13/2012  . Thyroid cancer, multifocal papillary carcinoma 01/31/2012  . 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 GERD 07/14/2010  . ANEMIA, IRON DEFICIENCY 05/27/2010  . SHOULDER PAIN 05/26/2010  . ASTHMA 04/23/2010  . HOARSENESS, CHRONIC 02/11/2010  . DM II (diabetes mellitus, type II), controlled ([place]) 08/06/2009  . HYPOTHYROIDISM 12/21/2007  . CARPAL TUNNEL SYNDROME 05/02/2007    Past Surgical History:  Procedure Laterality Date  . BI-VENTRICULAR IMPLANTABLE CARDIOVERTER DEFIBRILLATOR  (CRT-D)  03/2010  . CARDIAC CATHETERIZATION  03/2010  . EP IMPLANTABLE DEVICE N/A 05/21/2016   Procedure:  ICD Generator Changeout;  Surgeon:  Evans Lance, MD;  Location: [facility];  Service: Cardiovascular;  Laterality: N/A;  . LEFT HEART CATHETERIZATION WITH CORONARY ANGIOGRAM N/A 09/06/2014   Procedure: LEFT HEART CATHETERIZATION WITH CORONARY ANGIOGRAM;  Surgeon: Burnell Blanks, MD;  Location: [facility] CATH LAB;  Service: Cardiovascular;  Laterality: N/A;  . PTX     traumatic, age 56 months  . THYROIDECTOMY  12/10/2011   Procedure: TOTAL THYROIDECTOMY WITH CENTRAL COMPARTMENT DISSECTION;  Surgeon: Earnstine Regal, MD;  Location: [facility];  Service: General;  Laterality: N/A;  Total thyroidectomy with limited central compartment lymph node dissection.  Marland Kitchen VASECTOMY         Home Medications    Prior to Admission medications   Medication Sig Start Date End Date Taking? Authorizing Provider  acetaminophen (TYLENOL) 500 MG tablet Take 1,000 mg by mouth every 6 (six) hours as needed (pain).    [provider]  allopurinol (ZYLOPRIM) 100 MG tablet Take 1 tablet (100 mg total) by mouth daily. 08/25/17   Colon Branch, MD  aspirin EC 81 MG tablet Take 1 tablet (81 mg total) by mouth daily. 08/29/14   Dunn, Nedra Hai, PA-C  atorvastatin (LIPITOR) 40 MG tablet Take 1 tablet (40 mg total) by mouth daily. 09/07/17   Colon Branch, MD  carvedilol (COREG) 12.5 MG tablet Take 12.5 mg by mouth daily.    [provider]  carvedilol (COREG) 12.5 MG tablet Take one (1) tablet (12.5 mg) by mouth twice daily. Please keep 12/21 at 10:40 am appointment for additional refills. 10/18/17   Evans Lance, MD  furosemide (LASIX) 20 MG tablet Take 1 tablet (20 mg total) by mouth daily. 04/22/17   Colon Branch, MD  levothyroxine (SYNTHROID, LEVOTHROID) 200 MCG tablet Take 200 mcg by mouth daily before breakfast. 02/04/16   [provider]  MAGNESIUM PO Take 1 tablet by mouth daily.    [provider]  metFORMIN (GLUCOPHAGE) 1000 MG tablet Take 1 tablet (1,000 mg total) by mouth 2 (two) times daily with a meal. 06/15/17   Colon Branch, MD  Multiple Vitamin (MULTIVITAMIN WITH MINERALS) TABS tablet Take 1 tablet by mouth daily.    [provider]  Omega-3 Fatty Acids (FISH OIL) 1000 MG CAPS Take 1,000 mg by mouth 2 (two) times daily.     [provider]  potassium chloride (KLOR-CON M10) 10 MEQ tablet Take 1 tablet (10 mEq total) by mouth daily. 10/27/17   Colon Branch, MD  sitaGLIPtin (JANUVIA) 100 MG tablet Take 1 tablet (100 mg total) by mouth daily. 11/16/17   Colon Branch, MD  tadalafil  (CIALIS) 20 MG tablet Take 1 tablet (20 mg total) by mouth daily as needed for erectile dysfunction. 04/11/17   Colon Branch, MD  vitamin E 400 UNIT capsule Take 400 Units by mouth daily.    [provider]    Family History Family History  Problem Relation Age of Onset  . Atrial fibrillation Father   . Hypertension Father        alive @ 53.  Marland Kitchen Hypothyroidism Mother   . Hypertension Mother        alive @ 90.  . Diabetes Maternal Grandfather   . Diabetes Maternal Aunt   . Colon cancer Maternal Aunt   . Colon cancer Maternal Aunt   . Lung cancer Paternal Uncle   . Lung cancer Maternal Uncle   . Asthma Sister   . Hypothyroidism Daughter   .  Prostate cancer Neg Hx   . CAD Neg Hx     Social History Social History   Tobacco Use  . Smoking status: Former Smoker    Packs/day: 2.50    Years: 2.00    Pack years: 5.00    Types: Cigarettes    Last attempt to quit: 11/29/1970    Years since quitting: 47.0  . Smokeless tobacco: Former Systems developer    Types: Chew  Substance Use Topics  . Alcohol use: Yes    Comment: 09/05/2014 "2 drinks q couple months, if that  . Drug use: No     Allergies   Losartan and Ramipril   Review of Systems Review of Systems  Constitutional: Positive for activity change.  Respiratory: Negative for shortness of breath.   Cardiovascular: Negative for chest pain.  Gastrointestinal: Negative for nausea.  Neurological: Positive for speech difficulty. Negative for dizziness, facial asymmetry, weakness, light-headedness, numbness and headaches.     Physical Exam Updated Vital Signs BP (!) 152/76 (BP Location: Right Arm)   Pulse 63   Temp 97.7 F (36.5 C) (Oral)   Resp 18   SpO2 97%   Physical Exam  Constitutional: He is oriented to person, place, and time. He appears well-developed.  HENT:  Head: Atraumatic.  Neck: Neck supple.  Cardiovascular: Normal rate.  Pulmonary/Chest: Effort normal.  Neurological: He is alert and oriented to person,  place, and time. No cranial nerve deficit. Coordination normal.  Cerebellar exam is normal (finger to nose) Sensory exam normal for bilateral upper and lower extremities - and patient is able to discriminate between sharp and dull. Motor exam is 4+/5   Skin: Skin is warm.  Nursing note and vitals reviewed.    ED Treatments / Results  Labs (all labs ordered are listed, but only abnormal results are displayed) Labs Reviewed  I-STAT CHEM 8, ED - Abnormal; Notable for the following components:      Result Value   Glucose, Bld 265 (*)    Calcium, Ion 1.07 (*)    All other components within normal limits  CBC WITH DIFFERENTIAL/PLATELET  ETHANOL    EKG  EKG Interpretation None       Radiology No results found.  Procedures Procedures (including critical care time)  Medications Ordered in ED Medications - No data to display   Initial Impression / Assessment and Plan / ED Course  I have reviewed the triage vital signs and the nursing notes.  Pertinent labs & imaging results that were available during my care of the patient were reviewed by me and considered in my medical decision making (see chart for details).     Patient comes in with chief complaint of slurred speech.  The patient has multiple medical comorbidities, including nonischemic cardiomyopathy, CAD, diabetes, left bundle branch block with pacemaker.  Patient also had alcohol consumption today, which is not normal for him.  On exam patient has no focal neuro deficits.  Patient had slurred speech at home, which over time has improved and resolved.  Given the multiple medical risk factors for stroke, we did consider TIA in the differential, however history really suggestive that the slurring of the speech was due to alcohol intoxication.  I discussed my assessment with the patient and family, and they fully agree with my assessment.  The are quite okay with patient not getting a full TIA workup.  I discussed with  the patient and family the risk factors for stroke that he does carry, and if this  was a missed TIA how the first 48 hours are critical -as that is the.  When patient has the highest risk of having a stroke.  Have advised him strongly not to ingest alcohol over the next few days, and keep family around.  Strict ED return precautions discussed with the patient and family, and they are in agreement with the plan.  Final Clinical Impressions(s) / ED Diagnoses   Final diagnoses:  Alcoholic intoxication without complication ([place])  Slurred speech    ED Discharge Orders    None       Varney Biles, MD 11/20/17 [phone]

## 2017-11-21 ENCOUNTER — Other Ambulatory Visit: Payer: Self-pay | Admitting: Internal Medicine

## 2017-11-30 ENCOUNTER — Other Ambulatory Visit: Payer: Self-pay | Admitting: Internal Medicine

## 2017-12-02 ENCOUNTER — Encounter: Payer: Self-pay | Admitting: Internal Medicine

## 2017-12-16 ENCOUNTER — Other Ambulatory Visit: Payer: Self-pay | Admitting: Internal Medicine

## 2017-12-19 ENCOUNTER — Encounter: Payer: Self-pay | Admitting: Internal Medicine

## 2017-12-19 ENCOUNTER — Ambulatory Visit (INDEPENDENT_AMBULATORY_CARE_PROVIDER_SITE_OTHER): Payer: 59 | Admitting: Internal Medicine

## 2017-12-19 VITALS — BP 128/82 | HR 64 | Temp 98.4°F | Resp 14 | Ht 73.0 in | Wt 259.1 lb

## 2017-12-19 DIAGNOSIS — Z125 Encounter for screening for malignant neoplasm of prostate: Secondary | ICD-10-CM | POA: Diagnosis not present

## 2017-12-19 DIAGNOSIS — Z1211 Encounter for screening for malignant neoplasm of colon: Secondary | ICD-10-CM

## 2017-12-19 DIAGNOSIS — I10 Essential (primary) hypertension: Secondary | ICD-10-CM

## 2017-12-19 DIAGNOSIS — Z8739 Personal history of other diseases of the musculoskeletal system and connective tissue: Secondary | ICD-10-CM

## 2017-12-19 DIAGNOSIS — E118 Type 2 diabetes mellitus with unspecified complications: Secondary | ICD-10-CM

## 2017-12-19 LAB — URIC ACID: URIC ACID, SERUM: 8.1 mg/dL — AB (ref 4.0–7.8)

## 2017-12-19 LAB — PSA: PSA: 0.84 ng/mL (ref 0.10–4.00)

## 2017-12-19 LAB — HEMOGLOBIN A1C: Hgb A1c MFr Bld: 7.2 % — ABNORMAL HIGH (ref 4.6–6.5)

## 2017-12-19 NOTE — Patient Instructions (Signed)
GO TO THE LAB : Get the blood work     GO TO THE FRONT DESK Schedule your next appointment for a  routine checkup in 4-5 months  

## 2017-12-19 NOTE — Assessment & Plan Note (Signed)
Prostate cancer screening: DRE was done 2017 PSA not completed.  Check a PSA today Colon cancer screening: Due for a colonoscopy, refer to GI

## 2017-12-19 NOTE — Progress Notes (Signed)
Subjective:    Patient ID: Mike Jimenez, male    DOB: [birth year], 67 y.o.   MRN: [id number]  DOS:  12/19/2017 Type of visit - description : Routine visit Interval history: DM: good med compliance , last A1c 6.9, slightly increased. Hypothyroidism: Good med compliance Reports loose stools on and off, they are no watery or bloody.  Wt Readings from Last 3 Encounters:  12/19/17 259 lb 2 oz (117.5 kg)  11/18/17 260 lb 4.8 oz (118.1 kg)  05/12/17 259 lb 6.4 oz (117.7 kg)    Review of Systems Denies chest pain, difficulty breathing or edema. No weight loss, nausea, vomiting.  No nominal pain.  Past Medical History:  Diagnosis Date  . Anemia   . Arthritis    "knees" (09/05/2014)  . Automatic implantable cardioverter-defibrillator in situ   . CAD (coronary artery disease)    a. Mild nonobstructive CAD by cath 08/2014.  Marland Kitchen CHF (congestive heart failure) ([place])   . Childhood asthma   . Gastric ulcer 2011  . GERD (gastroesophageal reflux disease)   . History of gout   . Hyperlipidemia   . Hypertension   . LBBB (left bundle branch block)   . NICM (nonischemic cardiomyopathy) ([place])    a. Dx 2011 - presented with sustained VT. Normal coronaries at that time, EF 10-15% by cath and 20-25% by echo. b. s/p biventricular ICD 2011. c. CP 08/2014: mild nonobstructive CAD.  Marland Kitchen Obesity   . Postsurgical hypothyroidism   . Thyroid cancer ([place])    a. 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 Dr Harlow Asa. s/p thyroidectomy 2013.  . Torn meniscus   . Type II diabetes mellitus ([place])   . Ventricular tachycardia ([place])    a. Sustained VT 2011 s/p MDT [id number] Concert BiV ICD, ser# [id number].    Past Surgical History:  Procedure Laterality Date  . BI-VENTRICULAR IMPLANTABLE CARDIOVERTER DEFIBRILLATOR  (CRT-D)  03/2010  . CARDIAC CATHETERIZATION  03/2010  . EP IMPLANTABLE DEVICE N/A 05/21/2016   Procedure:  ICD Generator Changeout;  Surgeon: Evans Lance, MD;  Location: [facility];   Service: Cardiovascular;  Laterality: N/A;  . LEFT HEART CATHETERIZATION WITH CORONARY ANGIOGRAM N/A 09/06/2014   Procedure: LEFT HEART CATHETERIZATION WITH CORONARY ANGIOGRAM;  Surgeon: Burnell Blanks, MD;  Location: [facility] CATH LAB;  Service: Cardiovascular;  Laterality: N/A;  . PTX     traumatic, age 67 months  . THYROIDECTOMY  12/10/2011   Procedure: TOTAL THYROIDECTOMY WITH CENTRAL COMPARTMENT DISSECTION;  Surgeon: Earnstine Regal, MD;  Location: [facility];  Service: General;  Laterality: N/A;  Total thyroidectomy with limited central compartment lymph node dissection.  Marland Kitchen VASECTOMY      Social History   Socioeconomic History  . Marital status: Married    Spouse name: Not on file  . Number of children: 2  . Years of education: Not on file  . Highest education level: Not on file  Social Needs  . Financial resource strain: Not on file  . Food insecurity - worry: Not on file  . Food insecurity - inability: Not on file  . Transportation needs - medical: Not on file  . Transportation needs - non-medical: Not on file  Occupational History  . Occupation: Warehouse work    Fish farm manager: [employer]  Tobacco Use  . Smoking status: Former Smoker    Packs/day: 2.50    Years: 2.00    Pack years: 5.00    Types: Cigarettes    Last attempt to  quit: 11/29/1970    Years since quitting: 47.0  . Smokeless tobacco: Former Systems developer    Types: Chew  Substance and Sexual Activity  . Alcohol use: Yes    Comment: 09/05/2014 "2 drinks q couple months, if that  . Drug use: No  . Sexual activity: Yes  Other Topics Concern  . Not on file  Social History Narrative   Live w/ wife , daughter lives w/ them - in [place]   8 dogs, fish pond.   Job is very physical.   Does not routinely exercise.      Allergies as of 12/19/2017      Reactions   Losartan Other (See Comments)   Leg cramps   Ramipril Cough      Medication List        Accurate as of 12/19/17  5:55 PM. Always use your most recent med list.            acetaminophen 500 MG tablet Commonly known as:  TYLENOL Take 1,000 mg by mouth every 6 (six) hours as needed (pain).   allopurinol 100 MG tablet Commonly known as:  ZYLOPRIM Take 1 tablet (100 mg total) by mouth daily.   aspirin EC 81 MG tablet Take 1 tablet (81 mg total) by mouth daily.   atorvastatin 40 MG tablet Commonly known as:  LIPITOR Take 1 tablet (40 mg total) by mouth daily.   carvedilol 12.5 MG tablet Commonly known as:  COREG Take 12.5 mg by mouth daily.   Fish Oil 1000 MG Caps Take 1,000 mg by mouth 2 (two) times daily.   furosemide 20 MG tablet Commonly known as:  LASIX Take 1 tablet (20 mg total) by mouth daily.   levothyroxine 200 MCG tablet Commonly known as:  SYNTHROID, LEVOTHROID Take 200 mcg by mouth daily before breakfast.   MAGNESIUM PO Take 1 tablet by mouth daily.   metFORMIN 1000 MG tablet Commonly known as:  GLUCOPHAGE Take 1 tablet (1,000 mg total) by mouth 2 (two) times daily with a meal.   multivitamin with minerals Tabs tablet Take 1 tablet by mouth daily.   potassium chloride 10 MEQ tablet Commonly known as:  KLOR-CON M10 Take 1 tablet (10 mEq total) by mouth daily.   sitaGLIPtin 100 MG tablet Commonly known as:  JANUVIA Take 1 tablet (100 mg total) by mouth daily.   tadalafil 20 MG tablet Commonly known as:  CIALIS Take 1 tablet (20 mg total) by mouth daily as needed for erectile dysfunction.   vitamin E 400 UNIT capsule Take 400 Units by mouth daily.          Objective:   Physical Exam BP 128/82 (BP Location: Left Arm, Patient Position: Sitting, Cuff Size: Normal)   Pulse 64   Temp 98.4 F (36.9 C) (Oral)   Resp 14   Ht 6\' 1"  (1.854 m)   Wt 259 lb 2 oz (117.5 kg)   SpO2 94%   BMI 34.19 kg/m  General:   Well developed, well nourished . NAD.  HEENT:  Normocephalic . Face symmetric, atraumatic Lungs:  CTA B Normal respiratory effort, no intercostal retractions, no accessory muscle use. Heart: RRR,  no  murmur.  no pretibial edema bilaterally  Abdomen:  Not distended, soft, non-tender. No rebound or rigidity.   Skin: Not pale. Not jaundice Neurologic:  alert & oriented X3.  Speech normal, gait appropriate for age and unassisted Psych--  Cognition and judgment appear intact.  Cooperative with normal attention span and  concentration.  Behavior appropriate. No anxious or depressed appearing.     Assessment & Plan:   Assessment DM, + Mild neuropathy on exam 03/2017 HTN Hyperlipidemia Hypothyroidism h/oThyroid cancer, nodularity, no mets, thyroidectomy 2013- sees Dr Buddy Duty yearly as off 05-2016  CV: --CHF/nonischemic cardiomyopathy dx 2011 --Ventricular tachycardia, 2011, normal coronaries, EF  ~15 -20 % -- s/p  biventricular ICD 2011--- replaced 04-2016 --CAD -Nonobstructive   by cath 08-2014 --LBBB  GI: --GERD --H/o anemia:  --Gastric ulcer per EGD 07-2010 --Colonoscopy 2011:4 polyps  H/o gout  PLAN DM: On metformin, Januvia.  Sees Endo once a year.  Last A1c 6.9,  recheck a A1c. HTN: Seems well controlled on carvedilol, Lasix, potassium.  Last BMP satisfactory. Hypothyroidism: On Synthroid, last TSH satisfactory CHF, cardiomyopathy, VT, ICD:  saw cardiology 10-2017, felt to be stable, euvolemic, normal ICD.  Rx to RTC 1 year Loose stools: No red flag symptoms, last  CBC with no anemia, observation for now. Primary care issues reviewed. RTC 4-5 months

## 2017-12-19 NOTE — Progress Notes (Signed)
Pre visit review using our clinic review tool, if applicable. No additional management support is needed unless otherwise documented below in the visit note. 

## 2017-12-19 NOTE — Assessment & Plan Note (Signed)
DM: On metformin, Januvia.  Sees Endo once a year.  Last A1c 6.9,  recheck a A1c. HTN: Seems well controlled on carvedilol, Lasix, potassium.  Last BMP satisfactory. Hypothyroidism: On Synthroid, last TSH satisfactory CHF, cardiomyopathy, VT, ICD:  saw cardiology 10-2017, felt to be stable, euvolemic, normal ICD.  Rx to RTC 1 year Loose stools: No red flag symptoms, last  CBC with no anemia, observation for now. Primary care issues reviewed. RTC 4-5 months

## 2017-12-20 ENCOUNTER — Telehealth: Payer: Self-pay

## 2017-12-20 ENCOUNTER — Ambulatory Visit (INDEPENDENT_AMBULATORY_CARE_PROVIDER_SITE_OTHER): Payer: 59 | Admitting: *Deleted

## 2017-12-20 DIAGNOSIS — I5022 Chronic systolic (congestive) heart failure: Secondary | ICD-10-CM

## 2017-12-20 DIAGNOSIS — I428 Other cardiomyopathies: Secondary | ICD-10-CM

## 2017-12-20 DIAGNOSIS — Z95810 Presence of automatic (implantable) cardiac defibrillator: Secondary | ICD-10-CM

## 2017-12-20 MED ORDER — PIOGLITAZONE HCL 30 MG PO TABS: 30.0000 mg | ORAL_TABLET | Freq: Every day | ORAL | 6 refills | Status: DC

## 2017-12-20 NOTE — Telephone Encounter (Signed)
Okay to refill, see labs

## 2017-12-20 NOTE — Progress Notes (Signed)
Remote ICD transmission.   

## 2017-12-20 NOTE — Telephone Encounter (Signed)
Rx sent 

## 2017-12-20 NOTE — Telephone Encounter (Signed)
Remote ICM transmission received.  Attempted call to patient and line busy.  

## 2017-12-20 NOTE — Telephone Encounter (Signed)
Pt requesting refill on metformin 1000mg  bid. A1c from last week slightly elevated- okay to refill?

## 2017-12-20 NOTE — Progress Notes (Signed)
EPIC Encounter for ICM Monitoring  Patient Name: Mike Jimenez is a 67 y.o. male Date: 12/20/2017 Primary Care Physican: Colon Branch, MD Primary Cardiologist:Taylor Electrophysiologist: Lovena Le Dry Weight:unknown Bi-V Pacing: 98.2%      Attempted call to patient and unable to reach.    Transmission reviewed.    Thoracic impedance normal.  Prescribed dosage: Furosemide 20 mg 1tablet (20 mg total) daily. Potassium 10 mEq 1tablet (10 mEq total) daily  Recommendations: NONE - Unable to reach.  Follow-up plan: ICM clinic phone appointment on 01/20/2018.   Copy of ICM check sent to Dr. Lovena Le.   3 month ICM trend: 12/20/2017    1 Year ICM trend:       Rosalene Billings, RN 12/20/2017 10:45 AM

## 2017-12-20 NOTE — Addendum Note (Signed)
Addended byDamita Dunnings D on: 12/20/2017 01:20 PM   Modules accepted: Orders

## 2017-12-21 ENCOUNTER — Encounter: Payer: Self-pay | Admitting: Cardiology

## 2017-12-22 ENCOUNTER — Other Ambulatory Visit: Payer: Self-pay | Admitting: Internal Medicine

## 2017-12-23 LAB — CUP PACEART REMOTE DEVICE CHECK
Battery Remaining Longevity: 93 mo
Battery Voltage: 3 V
Brady Statistic AP VP Percent: 1.54 %
Brady Statistic AS VP Percent: 96.88 %
Brady Statistic RA Percent Paced: 1.58 %
Brady Statistic RV Percent Paced: 62.03 %
Date Time Interrogation Session: 20190122062304
HighPow Impedance: 48 Ohm
HighPow Impedance: 60 Ohm
Implantable Lead Implant Date: 20110516
Implantable Lead Implant Date: 20110516
Implantable Lead Location: 753858
Implantable Lead Location: 753860
Implantable Lead Model: 5076
Implantable Lead Model: 6947
Lead Channel Impedance Value: 399 Ohm
Lead Channel Impedance Value: 570 Ohm
Lead Channel Impedance Value: 874 Ohm
Lead Channel Pacing Threshold Amplitude: 0.625 V
Lead Channel Pacing Threshold Amplitude: 1.25 V
Lead Channel Pacing Threshold Pulse Width: 0.4 ms
Lead Channel Pacing Threshold Pulse Width: 0.4 ms
Lead Channel Sensing Intrinsic Amplitude: 1.875 mV
Lead Channel Sensing Intrinsic Amplitude: 10.625 mV
Lead Channel Setting Pacing Amplitude: 2.5 V
Lead Channel Setting Pacing Amplitude: 2.5 V
Lead Channel Setting Sensing Sensitivity: 0.3 mV
MDC IDC LEAD IMPLANT DT: 20110516
MDC IDC LEAD LOCATION: 753859
MDC IDC MSMT LEADCHNL LV IMPEDANCE VALUE: 399 Ohm
MDC IDC MSMT LEADCHNL LV PACING THRESHOLD AMPLITUDE: 1.125 V
MDC IDC MSMT LEADCHNL LV PACING THRESHOLD PULSEWIDTH: 0.4 ms
MDC IDC MSMT LEADCHNL RA SENSING INTR AMPL: 1.875 mV
MDC IDC MSMT LEADCHNL RV IMPEDANCE VALUE: 323 Ohm
MDC IDC MSMT LEADCHNL RV IMPEDANCE VALUE: 342 Ohm
MDC IDC MSMT LEADCHNL RV SENSING INTR AMPL: 10.625 mV
MDC IDC PG IMPLANT DT: 20170623
MDC IDC SET LEADCHNL LV PACING AMPLITUDE: 2.25 V
MDC IDC SET LEADCHNL LV PACING PULSEWIDTH: 0.4 ms
MDC IDC SET LEADCHNL RV PACING PULSEWIDTH: 0.4 ms
MDC IDC STAT BRADY AP VS PERCENT: 0.04 %
MDC IDC STAT BRADY AS VS PERCENT: 1.54 %

## 2018-01-05 ENCOUNTER — Other Ambulatory Visit: Payer: Self-pay | Admitting: Internal Medicine

## 2018-01-13 ENCOUNTER — Encounter: Payer: Self-pay | Admitting: Internal Medicine

## 2018-01-20 ENCOUNTER — Ambulatory Visit (INDEPENDENT_AMBULATORY_CARE_PROVIDER_SITE_OTHER): Payer: 59

## 2018-01-20 ENCOUNTER — Telehealth: Payer: Self-pay

## 2018-01-20 DIAGNOSIS — Z95810 Presence of automatic (implantable) cardiac defibrillator: Secondary | ICD-10-CM

## 2018-01-20 DIAGNOSIS — I5022 Chronic systolic (congestive) heart failure: Secondary | ICD-10-CM | POA: Diagnosis not present

## 2018-01-20 NOTE — Progress Notes (Signed)
EPIC Encounter for ICM Monitoring  Patient Name: Mike Jimenez is a 67 y.o. male Date: 01/20/2018 Primary Care Physican: Colon Branch, MD Primary Cardiologist:Taylor Electrophysiologist: Lovena Le Dry Weight:unknown Bi-V Pacing: 98.3%        Attempted call to patient and unable to reach.  Left detailed message regarding transmission.  Transmission reviewed.    Thoracic impedance normal.  Prescribed dosage: Furosemide 20 mg 1tablet (20 mg total) daily. Potassium 10 mEq 1tablet (10 mEq total) daily  Recommendations: Left voice mail with ICM number and encouraged to call if experiencing any fluid symptoms.  Follow-up plan: ICM clinic phone appointment on 02/20/2018.    Copy of ICM check sent to Dr. Lovena Le.   3 month ICM trend: 01/20/2018    1 Year ICM trend:       Rosalene Billings, RN 01/20/2018 8:08 AM

## 2018-01-20 NOTE — Telephone Encounter (Signed)
Remote ICM transmission received.  Attempted call to patient and left detailed message per DPR regarding transmission and next ICM scheduled for 02/20/2018.  Advised to return call for any fluid symptoms or questions.    

## 2018-01-23 ENCOUNTER — Encounter: Payer: Self-pay | Admitting: Internal Medicine

## 2018-01-24 ENCOUNTER — Encounter: Payer: Self-pay | Admitting: Internal Medicine

## 2018-01-25 ENCOUNTER — Other Ambulatory Visit: Payer: Self-pay | Admitting: Internal Medicine

## 2018-01-26 ENCOUNTER — Encounter: Payer: Self-pay | Admitting: Internal Medicine

## 2018-02-09 ENCOUNTER — Encounter: Payer: Self-pay | Admitting: Internal Medicine

## 2018-02-09 ENCOUNTER — Telehealth: Payer: Self-pay

## 2018-02-09 ENCOUNTER — Ambulatory Visit: Payer: 59 | Admitting: Internal Medicine

## 2018-02-09 VITALS — BP 134/74 | HR 73 | Temp 97.6°F | Resp 14 | Ht 73.0 in | Wt 264.0 lb

## 2018-02-09 DIAGNOSIS — I10 Essential (primary) hypertension: Secondary | ICD-10-CM

## 2018-02-09 DIAGNOSIS — E118 Type 2 diabetes mellitus with unspecified complications: Secondary | ICD-10-CM | POA: Diagnosis not present

## 2018-02-09 MED ORDER — DAPAGLIFLOZIN PROPANEDIOL 5 MG PO TABS: 5.0000 mg | ORAL_TABLET | Freq: Every day | ORAL | 3 refills | Status: DC

## 2018-02-09 NOTE — Progress Notes (Signed)
Subjective:    Patient ID: Mike Jimenez, male    DOB: March 26, 1951, 67 y.o.   MRN: [id number]  DOS:  02/09/2018 Type of visit - description : f/u Interval history: Since the last visit, was intolerant to Actos, see messages. HTN.  BPs were elevated, carvedilol dose adjusted, doing better.   Review of Systems Denies chest pain, difficulty breathing no lower extremity edema Has occasional cramps, lower extremities, not severe.  Taking OTC magnesium.   Past Medical History:  Diagnosis Date  . Anemia   . Arthritis    "knees" (09/05/2014)  . Automatic implantable cardioverter-defibrillator in situ   . CAD (coronary artery disease)    a. Mild nonobstructive CAD by cath 08/2014.  Marland Kitchen CHF (congestive heart failure) ([place])   . Childhood asthma   . Gastric ulcer 2011  . GERD (gastroesophageal reflux disease)   . History of gout   . Hyperlipidemia   . Hypertension   . LBBB (left bundle branch block)   . NICM (nonischemic cardiomyopathy) ([place])    a. Dx 2011 - presented with sustained VT. Normal coronaries at that time, EF 10-15% by cath and 20-25% by echo. b. s/p biventricular ICD 2011. c. CP 08/2014: mild nonobstructive CAD.  Marland Kitchen Obesity   . Postsurgical hypothyroidism   . Thyroid cancer ([place])    a. 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 Dr Harlow Asa. s/p thyroidectomy 2013.  . Torn meniscus   . Type II diabetes mellitus ([place])   . Ventricular tachycardia ([place])    a. Sustained VT 2011 s/p MDT [id number] Concert BiV ICD, ser# [id number].    Past Surgical History:  Procedure Laterality Date  . BI-VENTRICULAR IMPLANTABLE CARDIOVERTER DEFIBRILLATOR  (CRT-D)  03/2010  . CARDIAC CATHETERIZATION  03/2010  . EP IMPLANTABLE DEVICE N/A 05/21/2016   Procedure:  ICD Generator Changeout;  Surgeon: Evans Lance, MD;  Location: [facility];  Service: Cardiovascular;  Laterality: N/A;  . LEFT HEART CATHETERIZATION WITH CORONARY ANGIOGRAM N/A 09/06/2014   Procedure: LEFT HEART  CATHETERIZATION WITH CORONARY ANGIOGRAM;  Surgeon: Burnell Blanks, MD;  Location: [facility] CATH LAB;  Service: Cardiovascular;  Laterality: N/A;  . PTX     traumatic, age 44 months  . THYROIDECTOMY  12/10/2011   Procedure: TOTAL THYROIDECTOMY WITH CENTRAL COMPARTMENT DISSECTION;  Surgeon: Earnstine Regal, MD;  Location: [facility];  Service: General;  Laterality: N/A;  Total thyroidectomy with limited central compartment lymph node dissection.  Marland Kitchen VASECTOMY      Social History   Socioeconomic History  . Marital status: Married    Spouse name: Not on file  . Number of children: 2  . Years of education: Not on file  . Highest education level: Not on file  Social Needs  . Financial resource strain: Not on file  . Food insecurity - worry: Not on file  . Food insecurity - inability: Not on file  . Transportation needs - medical: Not on file  . Transportation needs - non-medical: Not on file  Occupational History  . Occupation: Warehouse work    Fish farm manager: [employer]  Tobacco Use  . Smoking status: Former Smoker    Packs/day: 2.50    Years: 2.00    Pack years: 5.00    Types: Cigarettes    Last attempt to quit: 11/29/1970    Years since quitting: 47.2  . Smokeless tobacco: Former Systems developer    Types: Chew  Substance and Sexual Activity  . Alcohol use: Yes    Comment:  09/05/2014 "2 drinks q couple months, if that  . Drug use: No  . Sexual activity: Yes  Other Topics Concern  . Not on file  Social History Narrative   Live w/ wife , daughter lives w/ them - in [place]   8 dogs, fish pond.   Job is very physical.   Does not routinely exercise.      Allergies as of 02/09/2018      Reactions   Losartan Other (See Comments)   Leg cramps   Ramipril Cough      Medication List        Accurate as of 02/09/18  9:55 PM. Always use your most recent med list.          acetaminophen 500 MG tablet Commonly known as:  TYLENOL Take 1,000 mg by mouth every 6 (six) hours as needed (pain).   allopurinol  100 MG tablet Commonly known as:  ZYLOPRIM Take 1 tablet (100 mg total) by mouth daily.   aspirin EC 81 MG tablet Take 1 tablet (81 mg total) by mouth daily.   atorvastatin 40 MG tablet Commonly known as:  LIPITOR Take 1 tablet (40 mg total) by mouth daily.   carvedilol 12.5 MG tablet Commonly known as:  COREG Take 12.5 mg by mouth daily.   dapagliflozin propanediol 5 MG Tabs tablet Commonly known as:  FARXIGA Take 5 mg by mouth daily.   Fish Oil 1000 MG Caps Take 1,000 mg by mouth 2 (two) times daily.   furosemide 20 MG tablet Commonly known as:  LASIX Take 1 tablet (20 mg total) by mouth daily.   KLOR-CON M10 10 MEQ tablet Generic drug:  potassium chloride TAKE 1 TABLET BY MOUTH EVERY DAY   levothyroxine 200 MCG tablet Commonly known as:  SYNTHROID, LEVOTHROID Take 200 mcg by mouth daily before breakfast.   MAGNESIUM PO Take 1 tablet by mouth daily.   metFORMIN 1000 MG tablet Commonly known as:  GLUCOPHAGE Take 1 tablet (1,000 mg total) by mouth 2 (two) times daily with a meal.   multivitamin with minerals Tabs tablet Take 1 tablet by mouth daily.   sitaGLIPtin 100 MG tablet Commonly known as:  JANUVIA Take 1 tablet (100 mg total) by mouth daily.   tadalafil 20 MG tablet Commonly known as:  CIALIS Take 1 tablet (20 mg total) by mouth daily as needed for erectile dysfunction.   vitamin E 400 UNIT capsule Take 400 Units by mouth daily.          Objective:   Physical Exam BP 134/74 (BP Location: Left Arm, Patient Position: Sitting, Cuff Size: Normal)   Pulse 73   Temp 97.6 F (36.4 C) (Oral)   Resp 14   Ht 6\' 1"  (1.854 m)   Wt 264 lb (119.7 kg)   SpO2 96%   BMI 34.83 kg/m   General:   Well developed, well nourished . NAD.  HEENT:  Normocephalic . Face symmetric, atraumatic Lungs:  CTA B Normal respiratory effort, no intercostal retractions, no accessory muscle use. Heart: RRR,  no murmur.  Trace pretibial edema bilaterally  Skin: Not  pale. Not jaundice Neurologic:  alert & oriented X3.  Speech normal, gait appropriate for age and unassisted Psych--  Cognition and judgment appear intact.  Cooperative with normal attention span and concentration.  Behavior appropriate. No anxious or depressed appearing.      Assessment & Plan:    Assessment DM, + Mild neuropathy on exam 03/2017, actos intolerant 01-2018 HTN  Hyperlipidemia Hypothyroidism h/oThyroid cancer, nodularity, no mets, thyroidectomy 2013- sees Dr Buddy Duty yearly as off 05-2016  CV: --CHF/nonischemic cardiomyopathy dx 2011 --Ventricular tachycardia, 2011, normal coronaries, EF  ~15 -20 % -- s/p  biventricular ICD 2011--- replaced 04-2016 --CAD -Nonobstructive   by cath 08-2014 --LBBB  GI: --GERD --H/o anemia:  --Gastric ulcer per EGD 07-2010 --Colonoscopy 2011:4 polyps  H/o gout  PLAN DM: Since the last visit, he started Actos for a A1c of 7.2.  He had some  itching, that has resolved since he stopped Actos approximately a week ago.  (Mild LE edema noted today, related to Actos?). Currently on metformin, Januvia. Current CBG is 90-130. + personal h/o thyroid cancer; start [name], RX sent, s/e discussed.  Watch for low CBGs. HTN: Since the last visit BP was somewhat elevated, carvedilol dose increased, also on Lasix, potassium.  Currently BPs in the 136, 140 at home.  No change.  Has some leg cramps but taking OTC magnesium and they are not a major problem. RTC 6 weeks

## 2018-02-09 NOTE — Progress Notes (Signed)
Pre visit review using our clinic review tool, if applicable. No additional management support is needed unless otherwise documented below in the visit note. 

## 2018-02-09 NOTE — Telephone Encounter (Signed)
Received FMLA paperwork for Pt- his wife- Blanch Media was in automobile accident on 01/29/2018.

## 2018-02-09 NOTE — Patient Instructions (Signed)
GO TO THE FRONT DESK Schedule your next appointment for a  Check up in 6 weeks   Stop Actos  Start Farxiga  Watch for skin infections  Continue checking your blood sugars.  Call if they are consistently less than 90 or more than 150     Check the  blood pressure 2 or 3 times a   week   Be sure your blood pressure is between 110/65 and  135/85. If it is consistently higher or lower, let me know  Dapagliflozin tablets What is this medicine? DAPAGLIFLOZIN (DAP a gli FLOE zin) helps to treat type 2 diabetes. It helps to control blood sugar. Treatment is combined with diet and exercise. This medicine may be used for other purposes; ask your health care provider or pharmacist if you have questions. COMMON BRAND NAME(S): Wilder Glade What should I tell my health care provider before I take this medicine? They need to know if you have any of these conditions: -bladder cancer -dehydration -diabetic ketoacidosis -diet low in salt -eating less due to illness, surgery, dieting, or any other reason -having surgery -high cholesterol -history of pancreatitis or pancreas problems -history of yeast infection of the penis or vagina -if you often drink alcohol -infections in the bladder, kidneys, or urinary tract -kidney disease -low blood pressure -on hemodialysis -problems urinating -type 1 diabetes -uncircumcised male -an unusual or allergic reaction to dapagliflozin, other medicines, foods, dyes, or preservatives -pregnant or trying to get pregnant -breast-feeding How should I use this medicine? Take this medicine by mouth with a glass of water. Follow the directions on the prescription label. You can take it with or without food. If it upsets your stomach, take it with food. Take this medicine in the morning. Take your dose at the same time each day. Do not take more often than directed. Do not stop taking except on your doctor's advice. A special MedGuide will be given to you by the  pharmacist with each prescription and refill. Be sure to read this information carefully each time. Talk to your pediatrician regarding the use of this medicine in children. Special care may be needed. Overdosage: If you think you have taken too much of this medicine contact a poison control center or emergency room at once. NOTE: This medicine is only for you. Do not share this medicine with others. What if I miss a dose? If you miss a dose, take it as soon as you can. If it is almost time for your next dose, take only that dose. Do not take double or extra doses. What may interact with this medicine? Do not take this medicine with any of the following medications: -gatifloxacin This medicine may also interact with the following medications: -alcohol -certain medicines for blood pressure, heart disease -diuretics -insulin -nateglinide -pioglitazone -quinolone antibiotics like ciprofloxacin, levofloxacin, ofloxacin -repaglinide -some herbal dietary supplements -steroid medicines like prednisone or cortisone -sulfonylureas like glimepiride, glipizide, glyburide -thyroid medicine This list may not describe all possible interactions. Give your health care provider a list of all the medicines, herbs, non-prescription drugs, or dietary supplements you use. Also tell them if you smoke, drink alcohol, or use illegal drugs. Some items may interact with your medicine. What should I watch for while using this medicine? Visit your doctor or health care professional for regular checks on your progress. This medicine can cause a serious condition in which there is too much acid in the blood. If you develop nausea, vomiting, stomach pain, unusual tiredness, or breathing  problems, stop taking this medicine and call your doctor right away. If possible, use a ketone dipstick to check for ketones in your urine. A test called the HbA1C (A1C) will be monitored. This is a simple blood test. It measures your  blood sugar control over the last 2 to 3 months. You will receive this test every 3 to 6 months. Learn how to check your blood sugar. Learn the symptoms of low and high blood sugar and how to manage them. Always carry a quick-source of sugar with you in case you have symptoms of low blood sugar. Examples include hard sugar candy or glucose tablets. Make sure others know that you can choke if you eat or drink when you develop serious symptoms of low blood sugar, such as seizures or unconsciousness. They must get medical help at once. Tell your doctor or health care professional if you have high blood sugar. You might need to change the dose of your medicine. If you are sick or exercising more than usual, you might need to change the dose of your medicine. Do not skip meals. Ask your doctor or health care professional if you should avoid alcohol. Many nonprescription cough and cold products contain sugar or alcohol. These can affect blood sugar. Wear a medical ID bracelet or chain, and carry a card that describes your disease and details of your medicine and dosage times. What side effects may I notice from receiving this medicine? Side effects that you should report to your doctor or health care professional as soon as possible: -allergic reactions like skin rash, itching or hives, swelling of the face, lips, or tongue -breathing problems -dizziness -feeling faint or lightheaded, falls -muscle weakness -nausea, vomiting, unusual stomach upset or pain -signs and symptoms of low blood sugar such as feeling anxious, confusion, dizziness, increased hunger, unusually weak or tired, sweating, shakiness, cold, irritable, headache, blurred vision, fast heartbeat, loss of consciousness -signs and symptoms of a urinary tract infection, such as fever, chills, a burning feeling when urinating, blood in the urine, back pain -trouble passing urine or change in the amount of urine, including an urgent need to  urinate more often, in larger amounts, or at night -penile discharge, itching, or pain in men -unusual tiredness -vaginal discharge, itching, or odor in women Side effects that usually do not require medical attention (report to your doctor or health care professional if they continue or are bothersome): -constipation -mild increase in urination -stuffy or runny nose -sore throat -thirsty This list may not describe all possible side effects. Call your doctor for medical advice about side effects. You may report side effects to FDA at 1-800-FDA-1088. Where should I keep my medicine? Keep out of the reach of children. Store at room temperature between 15 and 30 degrees C (59 and 86 degrees F). Throw away any unused medicine after the expiration date. NOTE: This sheet is a summary. It may not cover all possible information. If you have questions about this medicine, talk to your doctor, pharmacist, or health care provider.  2018 Elsevier/Gold Standard (2015-12-18 08:46:40)

## 2018-02-09 NOTE — Assessment & Plan Note (Signed)
DM: Since the last visit, he started Actos for a A1c of 7.2.  He had some  itching, that has resolved since he stopped Actos approximately a week ago.  (Mild LE edema noted today, related to Actos?). Currently on metformin, Januvia. Current CBG is 90-130. + personal h/o thyroid cancer; start [name], RX sent, s/e discussed.  Watch for low CBGs. HTN: Since the last visit BP was somewhat elevated, carvedilol dose increased, also on Lasix, potassium.  Currently BPs in the 136, 140 at home.  No change.  Has some leg cramps but taking OTC magnesium and they are not a major problem. RTC 6 weeks

## 2018-02-17 NOTE — Telephone Encounter (Signed)
Completed as much as possible on FMLA paperwork for spousal care; forwarded to provider/SLS 03/22

## 2018-02-20 ENCOUNTER — Telehealth: Payer: Self-pay

## 2018-02-20 ENCOUNTER — Ambulatory Visit (INDEPENDENT_AMBULATORY_CARE_PROVIDER_SITE_OTHER): Payer: 59

## 2018-02-20 DIAGNOSIS — I5022 Chronic systolic (congestive) heart failure: Secondary | ICD-10-CM | POA: Diagnosis not present

## 2018-02-20 DIAGNOSIS — Z95810 Presence of automatic (implantable) cardiac defibrillator: Secondary | ICD-10-CM

## 2018-02-20 NOTE — Telephone Encounter (Signed)
Remote ICM transmission received.  Attempted call to patient and left detailed message per DPR regarding transmission and next ICM scheduled for 03/23/2018.  Advised to return call for any fluid symptoms or questions.    

## 2018-02-20 NOTE — Progress Notes (Signed)
EPIC Encounter for ICM Monitoring  Patient Name: Mike Jimenez is a 67 y.o. male Date: 02/20/2018 Primary Care Physican: Colon Branch, MD Primary Cardiologist:Taylor Electrophysiologist: Lovena Le Dry Weight:unknown Bi-V Pacing: 98.2%        Attempted call to patient and unable to reach.  Left detailed message regarding transmission.  Transmission reviewed.    Thoracic impedance normal.  Prescribed dosage: Furosemide 20 mg 1tablet (20 mg total) daily. Potassium 10 mEq 1tablet (10 mEq total) daily  Recommendations: Left voice mail with ICM number and encouraged to call if experiencing any fluid symptoms.  Follow-up plan: ICM clinic phone appointment on 03/23/2018.    Copy of ICM check sent to Dr. Lovena Le.   3 month ICM trend: 02/20/2018    1 Year ICM trend:       Rosalene Billings, RN 02/20/2018 12:03 PM

## 2018-02-22 ENCOUNTER — Other Ambulatory Visit: Payer: Self-pay | Admitting: Internal Medicine

## 2018-03-07 ENCOUNTER — Other Ambulatory Visit: Payer: Self-pay | Admitting: Internal Medicine

## 2018-03-08 ENCOUNTER — Other Ambulatory Visit: Payer: Self-pay | Admitting: Internal Medicine

## 2018-03-08 NOTE — Telephone Encounter (Signed)
Ivin Booty- were these faxed in?

## 2018-03-08 NOTE — Telephone Encounter (Signed)
Per Ivin Booty- faxed on 02/21/2018.

## 2018-03-23 ENCOUNTER — Ambulatory Visit (INDEPENDENT_AMBULATORY_CARE_PROVIDER_SITE_OTHER): Payer: 59 | Admitting: *Deleted

## 2018-03-23 ENCOUNTER — Ambulatory Visit: Payer: 59 | Admitting: Internal Medicine

## 2018-03-23 ENCOUNTER — Encounter: Payer: Self-pay | Admitting: Internal Medicine

## 2018-03-23 ENCOUNTER — Telehealth: Payer: Self-pay

## 2018-03-23 VITALS — BP 130/74 | HR 54 | Temp 97.4°F | Resp 14 | Ht 73.0 in | Wt 261.5 lb

## 2018-03-23 DIAGNOSIS — I428 Other cardiomyopathies: Secondary | ICD-10-CM

## 2018-03-23 DIAGNOSIS — Z95810 Presence of automatic (implantable) cardiac defibrillator: Secondary | ICD-10-CM | POA: Diagnosis not present

## 2018-03-23 DIAGNOSIS — E118 Type 2 diabetes mellitus with unspecified complications: Secondary | ICD-10-CM | POA: Diagnosis not present

## 2018-03-23 DIAGNOSIS — I5022 Chronic systolic (congestive) heart failure: Secondary | ICD-10-CM

## 2018-03-23 LAB — HEMOGLOBIN A1C: HEMOGLOBIN A1C: 6.5 % (ref 4.6–6.5)

## 2018-03-23 LAB — BASIC METABOLIC PANEL
BUN: 20 mg/dL (ref 6–23)
CALCIUM: 8.9 mg/dL (ref 8.4–10.5)
CHLORIDE: 105 meq/L (ref 96–112)
CO2: 28 meq/L (ref 19–32)
Creatinine, Ser: 1.17 mg/dL (ref 0.40–1.50)
GFR: 66.26 mL/min (ref >60.00)
GLUCOSE: 107 mg/dL — AB (ref 70–99)
POTASSIUM: 4.1 meq/L (ref 3.5–5.1)
SODIUM: 141 meq/L (ref 135–145)

## 2018-03-23 NOTE — Progress Notes (Signed)
EPIC Encounter for ICM Monitoring  Patient Name: Mike Jimenez is a 67 y.o. male Date: 03/23/2018 Primary Care Physican: Colon Branch, MD Primary Cardiologist:Taylor Electrophysiologist: Druscilla Brownie Weight:unknown Bi-V Pacing: 98%        Attempted call to patient and unable to reach.  Left message to return call regarding transmission.  Transmission reviewed.    Thoracic impedance abnormal suggesting fluid accumulation.  Prescribed dosage: Furosemide 20 mg 1tablet (20 mg total) daily. Potassium 10 mEq 1tablet (10 mEq total) daily  Labs: 03/23/2018 Creatinine 1.17, BUN 20, Potassium 4.1, Sodium 141, EGFR 66.26  Recommendations: NONE - Unable to reach.  Follow-up plan: ICM clinic phone appointment on 03/30/2018.   Copy of ICM check sent to Dr. Lovena Le.   3 month ICM trend: 03/23/2018    1 Year ICM trend:       Rosalene Billings, RN 03/23/2018 2:45 PM

## 2018-03-23 NOTE — Patient Instructions (Addendum)
GO TO THE LAB : Get the blood work     GO TO THE FRONT DESK Schedule your next appointment for a  In 4 months   Diabetes: Check your blood sugar  once a day  at different times of the day  GOALS: Fasting before a meal 70- 130 2 hours after a meal less than 180

## 2018-03-23 NOTE — Progress Notes (Signed)
Subjective:    Patient ID: Mike Jimenez, male    DOB: [birth year], 67 y.o.   MRN: [id number]  DOS:  03/23/2018 Type of visit - description : f/u Interval history: DM: Good compliance with medication, no recent ambulatory CBGs.  When he started [name] he did feel low sugar symptoms a couple of times, that is resolved.  Wt Readings from Last 3 Encounters:  03/23/18 261 lb 8 oz (118.6 kg)  02/09/18 264 lb (119.7 kg)  12/19/17 259 lb 2 oz (117.5 kg)     Review of Systems Denies itching as he had before Edema seems decreased He gets leg cramps from time to time at night, better after he has a small serving of Gatorade (sugar-free) Denies any GU rash  Past Medical History:  Diagnosis Date  . Anemia   . Arthritis    "knees" (09/05/2014)  . Automatic implantable cardioverter-defibrillator in situ   . CAD (coronary artery disease)    a. Mild nonobstructive CAD by cath 08/2014.  Marland Kitchen CHF (congestive heart failure) ([place])   . Childhood asthma   . Gastric ulcer 2011  . GERD (gastroesophageal reflux disease)   . History of gout   . Hyperlipidemia   . Hypertension   . LBBB (left bundle branch block)   . NICM (nonischemic cardiomyopathy) ([place])    a. Dx 2011 - presented with sustained VT. Normal coronaries at that time, EF 10-15% by cath and 20-25% by echo. b. s/p biventricular ICD 2011. c. CP 08/2014: mild nonobstructive CAD.  Marland Kitchen Obesity   . Postsurgical hypothyroidism   . Thyroid cancer ([place])    a. 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 Dr Harlow Asa. s/p thyroidectomy 2013.  . Torn meniscus   . Type II diabetes mellitus ([place])   . Ventricular tachycardia ([place])    a. Sustained VT 2011 s/p MDT [id number] Concert BiV ICD, ser# [id number].    Past Surgical History:  Procedure Laterality Date  . BI-VENTRICULAR IMPLANTABLE CARDIOVERTER DEFIBRILLATOR  (CRT-D)  03/2010  . CARDIAC CATHETERIZATION  03/2010  . EP IMPLANTABLE DEVICE N/A 05/21/2016   Procedure:  ICD Generator Changeout;   Surgeon: Evans Lance, MD;  Location: [facility];  Service: Cardiovascular;  Laterality: N/A;  . LEFT HEART CATHETERIZATION WITH CORONARY ANGIOGRAM N/A 09/06/2014   Procedure: LEFT HEART CATHETERIZATION WITH CORONARY ANGIOGRAM;  Surgeon: Burnell Blanks, MD;  Location[facility] CATH LAB;  Service: Cardiovascular;  Laterality: N/A;  . PTX     traumatic, age 28 months  . THYROIDECTOMY  12/10/2011   Procedure: TOTAL THYROIDECTOMY WITH CENTRAL COMPARTMENT DISSECTION;  Surgeon: Earnstine Regal, MD;  Location: [facility];  Service: General;  Laterality: N/A;  Total thyroidectomy with limited central compartment lymph node dissection.  Marland Kitchen VASECTOMY      Social History   Socioeconomic History  . Marital status: Married    Spouse name: Not on file  . Number of children: 2  . Years of education: Not on file  . Highest education level: Not on file  Occupational History  . Occupation: Warehouse work    Fish farm manager: [employer]  Social Needs  . Financial resource strain: Not on file  . Food insecurity:    Worry: Not on file    Inability: Not on file  . Transportation needs:    Medical: Not on file    Non-medical: Not on file  Tobacco Use  . Smoking status: Former Smoker    Packs/day: 2.50    Years: 2.00  Pack years: 5.00    Types: Cigarettes    Last attempt to quit: 11/29/1970    Years since quitting: 47.3  . Smokeless tobacco: Former Systems developer    Types: Chew  Substance and Sexual Activity  . Alcohol use: Yes    Comment: 09/05/2014 "2 drinks q couple months, if that  . Drug use: No  . Sexual activity: Yes  Lifestyle  . Physical activity:    Days per week: Not on file    Minutes per session: Not on file  . Stress: Not on file  Relationships  . Social connections:    Talks on phone: Not on file    Gets together: Not on file    Attends religious service: Not on file    Active member of club or organization: Not on file    Attends meetings of clubs or organizations: Not on file    Relationship  status: Not on file  . Intimate partner violence:    Fear of current or ex partner: Not on file    Emotionally abused: Not on file    Physically abused: Not on file    Forced sexual activity: Not on file  Other Topics Concern  . Not on file  Social History Narrative   Live w/ wife , daughter lives w/ them - in [place]   8 dogs, fish pond.   Job is very physical.   Does not routinely exercise.      Allergies as of 03/23/2018      Reactions   Losartan Other (See Comments)   Leg cramps   Ramipril Cough      Medication List        Accurate as of 03/23/18 12:58 PM. Always use your most recent med list.          acetaminophen 500 MG tablet Commonly known as:  TYLENOL Take 1,000 mg by mouth every 6 (six) hours as needed (pain).   allopurinol 100 MG tablet Commonly known as:  ZYLOPRIM Take 1 tablet (100 mg total) by mouth daily.   aspirin EC 81 MG tablet Take 1 tablet (81 mg total) by mouth daily.   atorvastatin 40 MG tablet Commonly known as:  LIPITOR Take 1 tablet (40 mg total) by mouth daily.   carvedilol 12.5 MG tablet Commonly known as:  COREG Take 1 tablet (12.5 mg total) by mouth daily.   dapagliflozin propanediol 5 MG Tabs tablet Commonly known as:  FARXIGA Take 5 mg by mouth daily.   furosemide 20 MG tablet Commonly known as:  LASIX Take 1 tablet (20 mg total) by mouth daily.   KLOR-CON M10 10 MEQ tablet Generic drug:  potassium chloride TAKE 1 TABLET BY MOUTH EVERY DAY   levothyroxine 200 MCG tablet Commonly known as:  SYNTHROID, LEVOTHROID Take 200 mcg by mouth daily before breakfast.   MAGNESIUM PO Take 1 tablet by mouth daily.   metFORMIN 1000 MG tablet Commonly known as:  GLUCOPHAGE Take 1 tablet (1,000 mg total) by mouth 2 (two) times daily with a meal.   multivitamin with minerals Tabs tablet Take 1 tablet by mouth daily.   sitaGLIPtin 100 MG tablet Commonly known as:  JANUVIA Take 1 tablet (100 mg total) by mouth daily.   tadalafil 20  MG tablet Commonly known as:  CIALIS Take 1 tablet (20 mg total) by mouth daily as needed for erectile dysfunction.   vitamin E 400 UNIT capsule Take 400 Units by mouth daily.  Objective:   Physical Exam BP 130/74 (BP Location: Left Arm, Patient Position: Sitting, Cuff Size: Normal)   Pulse (!) 54   Temp (!) 97.4 F (36.3 C) (Oral)   Resp 14   Ht 6\' 1"  (1.854 m)   Wt 261 lb 8 oz (118.6 kg)   SpO2 97%   BMI 34.50 kg/m  General:   Well developed, well nourished . NAD.  HEENT:  Normocephalic . Face symmetric, atraumatic Lungs:  CTA B Normal respiratory effort, no intercostal retractions, no accessory muscle use. Heart: RRR,  no murmur.  No pretibial edema bilaterally  Skin: Not pale. Not jaundice Neurologic:  alert & oriented X3.  Speech normal, gait appropriate for age and unassisted Psych--  Cognition and judgment appear intact.  Cooperative with normal attention span and concentration.  Behavior appropriate. No anxious or depressed appearing.      Assessment & Plan:   Assessment DM, + Mild neuropathy on exam 03/2017, actos intolerant 01-2018 HTN Hyperlipidemia Hypothyroidism h/oThyroid cancer, nodularity, no mets, thyroidectomy 2013- sees Dr Buddy Duty yearly as off 02-2018 CV: --CHF/nonischemic cardiomyopathy dx 2011 --Ventricular tachycardia, 2011, normal coronaries, EF  ~15 -20 % -- s/p  biventricular ICD 2011--- replaced 04-2016 --CAD -Nonobstructive   by cath 08-2014 --LBBB  GI: --GERD --H/o anemia:  --Gastric ulcer per EGD 07-2010 --Colonoscopy 2011:4 polyps  H/o gout  PLAN DM: Since the last visit started [name] and stopped Actos, he is also on Januvia and metformin.  No recent ambulatory CBGs, has lost some weight. We will check a A1c and BMP, recommend healthy diet, check CBGs daily if possible.  Reports she had an eye check recently. Gout: Last uric acid 8.1 but has no symptoms, continue allopurinol Fish oil: Recent studies report no major   benefit from it, okay to stop. RTC 4 months

## 2018-03-23 NOTE — Assessment & Plan Note (Signed)
DM: Since the last visit started [name] and stopped Actos, he is also on Januvia and metformin.  No recent ambulatory CBGs, has lost some weight. We will check a A1c and BMP, recommend healthy diet, check CBGs daily if possible.  Reports she had an eye check recently. Gout: Last uric acid 8.1 but has no symptoms, continue allopurinol Fish oil: Recent studies report no major  benefit from it, okay to stop. RTC 4 months

## 2018-03-23 NOTE — Telephone Encounter (Signed)
Remote ICM transmission received.  Attempted call to patient and left message per DPR to return call regarding transmission.  

## 2018-03-23 NOTE — Progress Notes (Signed)
Pre visit review using our clinic review tool, if applicable. No additional management support is needed unless otherwise documented below in the visit note. 

## 2018-03-24 ENCOUNTER — Encounter: Payer: Self-pay | Admitting: Cardiology

## 2018-03-24 NOTE — Progress Notes (Signed)
Letter  

## 2018-03-24 NOTE — Progress Notes (Signed)
Remote ICD transmission.   

## 2018-03-27 ENCOUNTER — Telehealth: Payer: Self-pay

## 2018-03-27 NOTE — Telephone Encounter (Signed)
Attempted return call to patient as requested by voice mail in regards should patient take fish oil.  He reported his PCP has advised him to stop it so studies do not show there is a benefit to taking it.  Left message I will return call tomorrow.

## 2018-03-28 NOTE — Telephone Encounter (Signed)
Attempted call to patient. Advised discussed taking fish oil with Dr Tanna Furry nurse and there is no preference regarding the fish oil.  Advised he may follow PCP recommendations if would like to.  Encouraged to call back for any further questions.

## 2018-03-30 ENCOUNTER — Telehealth: Payer: Self-pay

## 2018-03-30 ENCOUNTER — Ambulatory Visit (INDEPENDENT_AMBULATORY_CARE_PROVIDER_SITE_OTHER): Payer: Self-pay

## 2018-03-30 DIAGNOSIS — I5022 Chronic systolic (congestive) heart failure: Secondary | ICD-10-CM

## 2018-03-30 DIAGNOSIS — Z95810 Presence of automatic (implantable) cardiac defibrillator: Secondary | ICD-10-CM

## 2018-03-30 NOTE — Telephone Encounter (Signed)
Remote ICM transmission received.  Attempted call to patient and left detailed message per DPR to return call regarding transmission. 

## 2018-03-30 NOTE — Progress Notes (Signed)
EPIC Encounter for ICM Monitoring  Patient Name: Mike Jimenez is a 67 y.o. male Date: 03/30/2018 Primary Care Physican: Colon Branch, MD Primary Cardiologist:Taylor Electrophysiologist: Druscilla Brownie Weight:unknown Bi-V Pacing: 98.1%        Attempted call to patient and unable to reach.  Left message to return call regarding transmission.  Transmission reviewed.    Thoracic impedance abnormal suggesting fluid accumulation since 03/21/2018.  Prescribed dosage: Furosemide 20 mg 1tablet (20 mg total) daily. Potassium 10 mEq 1tablet (10 mEq total) daily  Labs: 03/23/2018 Creatinine 1.17, BUN 20, Potassium 4.1, Sodium 141, EGFR 66.26  Recommendations: Left voice mail with ICM number and encouraged to call if experiencing any fluid symptoms.  Follow-up plan: ICM clinic phone appointment on 04/13/2018 to recheck fluid levels.    Copy of ICM check sent to Dr. Lovena Le.   3 month ICM trend: 03/30/2018    1 Year ICM trend:       Rosalene Billings, RN 03/30/2018 11:38 AM

## 2018-04-13 ENCOUNTER — Ambulatory Visit (INDEPENDENT_AMBULATORY_CARE_PROVIDER_SITE_OTHER): Payer: Self-pay

## 2018-04-13 DIAGNOSIS — Z95810 Presence of automatic (implantable) cardiac defibrillator: Secondary | ICD-10-CM

## 2018-04-13 DIAGNOSIS — I5022 Chronic systolic (congestive) heart failure: Secondary | ICD-10-CM

## 2018-04-13 NOTE — Progress Notes (Signed)
EPIC Encounter for ICM Monitoring  Patient Name: Mike Jimenez is a 67 y.o. male Date: 04/13/2018 Primary Care Physican: Colon Branch, MD Primary Cardiologist:Taylor Electrophysiologist: Druscilla Brownie Weight:unknown Bi-V Pacing: 98.2%        Attempted call to patient and unable to reach.  Left detailed message, per DPR, regarding transmission.  Transmission reviewed.    Thoracic impedance returned to normal since 03/30/2018 transmission.   Prescribed dosage: Furosemide 20 mg 1tablet (20 mg total) daily. Potassium 10 mEq 1tablet (10 mEq total) daily  Labs: 03/23/2018 Creatinine1.17, BUN20, Potassium4.1, Sodium141, UVHA68.93  Recommendations: .Left voice mail with ICM number and encouraged to call if experiencing any fluid symptoms.  Follow-up plan: ICM clinic phone appointment on 05/01/2018.    Copy of ICM check sent to Dr. Lovena Le.   3 month ICM trend: 04/13/2018    1 Year ICM trend:       Rosalene Billings, RN 04/13/2018 9:39 AM

## 2018-04-14 ENCOUNTER — Telehealth: Payer: Self-pay

## 2018-04-14 LAB — CUP PACEART REMOTE DEVICE CHECK
Brady Statistic AP VP Percent: 11.48 %
Brady Statistic AP VS Percent: 0.25 %
Brady Statistic AS VS Percent: 1.37 %
Brady Statistic RA Percent Paced: 11.72 %
Brady Statistic RV Percent Paced: 67.72 %
Date Time Interrogation Session: 20190516092703
HighPow Impedance: 47 Ohm
HighPow Impedance: 59 Ohm
Implantable Lead Implant Date: 20110516
Implantable Lead Location: 753858
Implantable Lead Location: 753860
Implantable Pulse Generator Implant Date: 20170623
Lead Channel Impedance Value: 266 Ohm
Lead Channel Impedance Value: 342 Ohm
Lead Channel Impedance Value: 399 Ohm
Lead Channel Impedance Value: 570 Ohm
Lead Channel Impedance Value: 893 Ohm
Lead Channel Pacing Threshold Amplitude: 0.875 V
Lead Channel Pacing Threshold Amplitude: 1.25 V
Lead Channel Pacing Threshold Pulse Width: 0.4 ms
Lead Channel Sensing Intrinsic Amplitude: 2.375 mV
Lead Channel Sensing Intrinsic Amplitude: 2.375 mV
Lead Channel Setting Pacing Amplitude: 2 V
Lead Channel Setting Pacing Amplitude: 2.5 V
Lead Channel Setting Pacing Amplitude: 2.5 V
Lead Channel Setting Pacing Pulse Width: 0.4 ms
Lead Channel Setting Sensing Sensitivity: 0.3 mV
MDC IDC LEAD IMPLANT DT: 20110516
MDC IDC LEAD IMPLANT DT: 20110516
MDC IDC LEAD LOCATION: 753859
MDC IDC MSMT BATTERY REMAINING LONGEVITY: 90 mo
MDC IDC MSMT BATTERY VOLTAGE: 2.99 V
MDC IDC MSMT LEADCHNL LV PACING THRESHOLD PULSEWIDTH: 0.4 ms
MDC IDC MSMT LEADCHNL RA IMPEDANCE VALUE: 399 Ohm
MDC IDC MSMT LEADCHNL RV PACING THRESHOLD AMPLITUDE: 0.875 V
MDC IDC MSMT LEADCHNL RV PACING THRESHOLD PULSEWIDTH: 0.4 ms
MDC IDC MSMT LEADCHNL RV SENSING INTR AMPL: 9.875 mV
MDC IDC MSMT LEADCHNL RV SENSING INTR AMPL: 9.875 mV
MDC IDC SET LEADCHNL LV PACING PULSEWIDTH: 0.4 ms
MDC IDC STAT BRADY AS VP PERCENT: 86.91 %

## 2018-04-14 NOTE — Telephone Encounter (Signed)
Remote ICM transmission received.  Attempted call to patient and left detailed message, per DPR, regarding transmission and next ICM scheduled for 05/01/2018.  Advised to return call for any fluid symptoms or questions.    

## 2018-05-01 ENCOUNTER — Other Ambulatory Visit: Payer: Self-pay | Admitting: Internal Medicine

## 2018-05-01 ENCOUNTER — Ambulatory Visit (INDEPENDENT_AMBULATORY_CARE_PROVIDER_SITE_OTHER): Payer: 59

## 2018-05-01 DIAGNOSIS — Z95810 Presence of automatic (implantable) cardiac defibrillator: Secondary | ICD-10-CM | POA: Diagnosis not present

## 2018-05-01 DIAGNOSIS — I5022 Chronic systolic (congestive) heart failure: Secondary | ICD-10-CM | POA: Diagnosis not present

## 2018-05-01 NOTE — Progress Notes (Signed)
EPIC Encounter for ICM Monitoring  Patient Name: Mike Jimenez is a 67 y.o. male Date: 05/01/2018 Primary Care Physican: Colon Branch, MD Primary Cardiologist:Taylor Electrophysiologist: Druscilla Brownie Weight:unknown Bi-V Pacing: 98.3%        Attempted call to patient and unable to reach.  Left detailed message, per DPR, regarding transmission.  Transmission reviewed.    Thoracic impedance returned to normal after last ICM remote transmission on 04/13/2018 but is abnormal suggesting fluid accumulation since 04/26/2018.  Prescribed dosage: Furosemide 20 mg 1tablet (20 mg total) daily. Potassium 10 mEq 1tablet (10 mEq total) daily  Labs: 03/23/2018 Creatinine1.17, BUN20, Potassium4.1, Sodium141, YKDX83.38  Recommendations: Left voice mail with ICM number and encouraged to call if experiencing any fluid symptoms.  Follow-up plan: ICM clinic phone appointment on 05/08/2018 to recheck fluid levels.  Copy of ICM check sent to Dr. Lovena Le.   3 month ICM trend: 05/01/2018    1 Year ICM trend:       Rosalene Billings, RN 05/01/2018 12:05 PM

## 2018-05-02 ENCOUNTER — Telehealth: Payer: Self-pay

## 2018-05-02 NOTE — Telephone Encounter (Signed)
Remote ICM transmission received.  Attempted call to patient and left detailed message, per DPR, regarding transmission and next ICM scheduled for 05/08/2018.  Advised to return call for any fluid symptoms or questions.    

## 2018-05-06 ENCOUNTER — Encounter: Payer: Self-pay | Admitting: Nurse Practitioner

## 2018-05-08 ENCOUNTER — Ambulatory Visit (INDEPENDENT_AMBULATORY_CARE_PROVIDER_SITE_OTHER): Payer: Self-pay

## 2018-05-08 DIAGNOSIS — Z95810 Presence of automatic (implantable) cardiac defibrillator: Secondary | ICD-10-CM

## 2018-05-08 DIAGNOSIS — I5022 Chronic systolic (congestive) heart failure: Secondary | ICD-10-CM

## 2018-05-08 NOTE — Progress Notes (Signed)
EPIC Encounter for ICM Monitoring  Patient Name: KAROL LIENDO is a 67 y.o. male Date: 05/08/2018 Primary Care Physican: Colon Branch, MD Primary Cardiologist:Taylor Electrophysiologist: Lovena Le Dry Weight:unknown Bi-V Pacing: 98.3%      Attempted call to patient and unable to reach.  Left detailed message, per DPR, regarding transmission.  Transmission reviewed.    Thoracic impedance returned to normal since last ICM remote transmission on 05/01/2018.  Prescribed dosage: Furosemide 20 mg 1tablet (20 mg total) daily. Potassium 10 mEq 1tablet (10 mEq total) daily  Labs: 03/23/2018 Creatinine1.17, BUN20, Potassium4.1, Sodium141, VICF99.78  Recommendations: Left voice mail with ICM number and encouraged to call if experiencing any fluid symptoms.  Follow-up plan: ICM clinic phone appointment on 06/08/2018.    Copy of ICM check sent to Dr. Lovena Le.   3 month ICM trend: 05/08/2018    1 Year ICM trend:       Rosalene Billings, RN 05/08/2018 12:21 PM

## 2018-05-09 ENCOUNTER — Telehealth: Payer: Self-pay

## 2018-05-09 NOTE — Telephone Encounter (Signed)
Remote ICM transmission received.  Attempted call to patient and left detailed message, per DPR, regarding transmission and next ICM scheduled for 06/08/2018.  Advised to return call for any fluid symptoms or questions.    

## 2018-05-22 ENCOUNTER — Ambulatory Visit: Payer: 59 | Admitting: Internal Medicine

## 2018-05-22 ENCOUNTER — Encounter: Payer: Self-pay | Admitting: Internal Medicine

## 2018-05-22 VITALS — BP 132/70 | HR 69 | Temp 97.7°F | Resp 16 | Ht 73.0 in | Wt 261.0 lb

## 2018-05-22 DIAGNOSIS — I5022 Chronic systolic (congestive) heart failure: Secondary | ICD-10-CM | POA: Diagnosis not present

## 2018-05-22 DIAGNOSIS — C73 Malignant neoplasm of thyroid gland: Secondary | ICD-10-CM | POA: Diagnosis not present

## 2018-05-22 DIAGNOSIS — I10 Essential (primary) hypertension: Secondary | ICD-10-CM

## 2018-05-22 DIAGNOSIS — E118 Type 2 diabetes mellitus with unspecified complications: Secondary | ICD-10-CM | POA: Diagnosis not present

## 2018-05-22 DIAGNOSIS — D229 Melanocytic nevi, unspecified: Secondary | ICD-10-CM

## 2018-05-22 DIAGNOSIS — E785 Hyperlipidemia, unspecified: Secondary | ICD-10-CM

## 2018-05-22 LAB — BASIC METABOLIC PANEL
BUN: 14 mg/dL (ref 6–23)
CALCIUM: 9.3 mg/dL (ref 8.4–10.5)
CO2: 28 meq/L (ref 19–32)
CREATININE: 1.14 mg/dL (ref 0.40–1.50)
Chloride: 103 mEq/L (ref 96–112)
GFR: 68.24 mL/min (ref >60.00)
Glucose, Bld: 160 mg/dL — ABNORMAL HIGH (ref 70–99)
Potassium: 4.2 mEq/L (ref 3.5–5.1)
Sodium: 141 mEq/L (ref 135–145)

## 2018-05-22 LAB — TSH: TSH: 1.18 u[IU]/mL (ref 0.35–4.50)

## 2018-05-22 LAB — LIPID PANEL
Cholesterol: 167 mg/dL (ref 0–200)
HDL: 38.8 mg/dL — AB (ref >39.00)
NONHDL: 128.22
Total CHOL/HDL Ratio: 4
Triglycerides: 243 mg/dL — ABNORMAL HIGH (ref 0.0–149.0)
VLDL: 48.6 mg/dL — AB (ref 0.0–40.0)

## 2018-05-22 LAB — LDL CHOLESTEROL, DIRECT: Direct LDL: 93 mg/dL

## 2018-05-22 LAB — AST: AST: 17 U/L (ref 0–37)

## 2018-05-22 LAB — ALT: ALT: 16 U/L (ref 0–53)

## 2018-05-22 NOTE — Progress Notes (Signed)
Pre visit review using our clinic review tool, if applicable. No additional management support is needed unless otherwise documented below in the visit note. 

## 2018-05-22 NOTE — Assessment & Plan Note (Signed)
DM: Continue farxiga, metformin, Januvia.  Improving his diet, he is avoiding processed CHO, praised!.  Last A1c very good.  No change CHF: Seems to be stable, continue carvedilol, Lasix, potassium, magnesium.  Check a BMP High cholesterol: On Lipitor, due for FLP, AST, ALT Hypothyroidism: on Synthroid, check a TSH Skin lesions: Multiple, recommend dermatology referral. Preventive care: Due for a colonoscopy, plans to call and schedule. RTC CPX 4 months

## 2018-05-22 NOTE — Patient Instructions (Signed)
GO TO THE LAB : Get the blood work     GO TO THE FRONT DESK Schedule your next appointment for a physical exam in 4 months.  No need to be fasting

## 2018-05-22 NOTE — Progress Notes (Signed)
Subjective:    Patient ID: Mike Jimenez, male    DOB: 01-26-1951, 67 y.o.   MRN: [id number]  DOS:  05/22/2018 Type of visit - description : rov Interval history: DM: Good compliance to medication, no recent ambulatory CBGs, has improve his diet Gout: No recent events HTN: Good med compliance, no ambulatory BPs CHF: No edema, no concerns Wife has noted multiple skin lesions, concern, dermatology referral?.  Wt Readings from Last 3 Encounters:  05/22/18 261 lb (118.4 kg)  03/23/18 261 lb 8 oz (118.6 kg)  02/09/18 264 lb (119.7 kg)     Review of Systems Denies chest pain no difficulty breathing. No nausea, vomiting, diarrhea  Past Medical History:  Diagnosis Date  . Anemia   . Arthritis    "knees" (09/05/2014)  . Automatic implantable cardioverter-defibrillator in situ   . CAD (coronary artery disease)    a. Mild nonobstructive CAD by cath 08/2014.  Marland Kitchen CHF (congestive heart failure) ([place])   . Childhood asthma   . Gastric ulcer 2011  . GERD (gastroesophageal reflux disease)   . History of gout   . Hyperlipidemia   . Hypertension   . LBBB (left bundle branch block)   . NICM (nonischemic cardiomyopathy) ([place])    a. Dx 2011 - presented with sustained VT. Normal coronaries at that time, EF 10-15% by cath and 20-25% by echo. b. s/p biventricular ICD 2011. c. CP 08/2014: mild nonobstructive CAD.  Marland Kitchen Obesity   . Postsurgical hypothyroidism   . Thyroid cancer ([place])    a. 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 Dr Harlow Asa. s/p thyroidectomy 2013.  . Torn meniscus   . Type II diabetes mellitus ([place])   . Ventricular tachycardia ([place])    a. Sustained VT 2011 s/p MDT [id number] Concert BiV ICD, ser# [id number].    Past Surgical History:  Procedure Laterality Date  . BI-VENTRICULAR IMPLANTABLE CARDIOVERTER DEFIBRILLATOR  (CRT-D)  03/2010  . CARDIAC CATHETERIZATION  03/2010  . EP IMPLANTABLE DEVICE N/A 05/21/2016   Procedure:  ICD Generator Changeout;  Surgeon: Evans Lance, MD;  Location: [facility];  Service: Cardiovascular;  Laterality: N/A;  . LEFT HEART CATHETERIZATION WITH CORONARY ANGIOGRAM N/A 09/06/2014   Procedure: LEFT HEART CATHETERIZATION WITH CORONARY ANGIOGRAM;  Surgeon: Burnell Blanks, MD;  Location: [facility]ATH LAB;  Service: Cardiovascular;  Laterality: N/A;  . PTX     traumatic, age 20 months  . THYROIDECTOMY  12/10/2011   Procedure: TOTAL THYROIDECTOMY WITH CENTRAL COMPARTMENT DISSECTION;  Surgeon: Earnstine Regal, MD;  Location: [facility];  Service: General;  Laterality: N/A;  Total thyroidectomy with limited central compartment lymph node dissection.  Marland Kitchen VASECTOMY      Social History   Socioeconomic History  . Marital status: Married    Spouse name: Not on file  . Number of children: 2  . Years of education: Not on file  . Highest education level: Not on file  Occupational History  . Occupation: Warehouse work    Fish farm manager: [employer]  Social Needs  . Financial resource strain: Not on file  . Food insecurity:    Worry: Not on file    Inability: Not on file  . Transportation needs:    Medical: Not on file    Non-medical: Not on file  Tobacco Use  . Smoking status: Former Smoker    Packs/day: 2.50    Years: 2.00    Pack years: 5.00    Types: Cigarettes    Last  attempt to quit: 11/29/1970    Years since quitting: 47.5  . Smokeless tobacco: Former Systems developer    Types: Chew  Substance and Sexual Activity  . Alcohol use: Yes    Comment: 09/05/2014 "2 drinks q couple months, if that  . Drug use: No  . Sexual activity: Yes  Lifestyle  . Physical activity:    Days per week: Not on file    Minutes per session: Not on file  . Stress: Not on file  Relationships  . Social connections:    Talks on phone: Not on file    Gets together: Not on file    Attends religious service: Not on file    Active member of club or organization: Not on file    Attends meetings of clubs or organizations: Not on file    Relationship status: Not on  file  . Intimate partner violence:    Fear of current or ex partner: Not on file    Emotionally abused: Not on file    Physically abused: Not on file    Forced sexual activity: Not on file  Other Topics Concern  . Not on file  Social History Narrative   Live w/ wife , daughter lives w/ them - in [place]   8 dogs, fish pond.   Job is very physical.   Does not routinely exercise.      Allergies as of 05/22/2018      Reactions   Losartan Other (See Comments)   Leg cramps   Ramipril Cough      Medication List        Accurate as of 05/22/18  3:32 PM. Always use your most recent med list.          acetaminophen 500 MG tablet Commonly known as:  TYLENOL Take 1,000 mg by mouth every 6 (six) hours as needed (pain).   allopurinol 100 MG tablet Commonly known as:  ZYLOPRIM Take 1 tablet (100 mg total) by mouth daily.   aspirin EC 81 MG tablet Take 1 tablet (81 mg total) by mouth daily.   atorvastatin 40 MG tablet Commonly known as:  LIPITOR Take 1 tablet (40 mg total) by mouth daily.   carvedilol 12.5 MG tablet Commonly known as:  COREG Take 1 tablet (12.5 mg total) by mouth daily.   dapagliflozin propanediol 5 MG Tabs tablet Commonly known as:  FARXIGA Take 5 mg by mouth daily.   furosemide 20 MG tablet Commonly known as:  LASIX Take 1 tablet (20 mg total) by mouth daily.   KLOR-CON M10 10 MEQ tablet Generic drug:  potassium chloride TAKE 1 TABLET BY MOUTH EVERY DAY   levothyroxine 200 MCG tablet Commonly known as:  SYNTHROID, LEVOTHROID Take 200 mcg by mouth daily before breakfast.   MAGNESIUM PO Take 1 tablet by mouth daily.   metFORMIN 1000 MG tablet Commonly known as:  GLUCOPHAGE Take 1 tablet (1,000 mg total) by mouth 2 (two) times daily with a meal.   multivitamin with minerals Tabs tablet Take 1 tablet by mouth daily.   sitaGLIPtin 100 MG tablet Commonly known as:  JANUVIA Take 1 tablet (100 mg total) by mouth daily.   tadalafil 20 MG  tablet Commonly known as:  ADCIRCA/CIALIS Take 1 tablet (20 mg total) by mouth daily as needed for erectile dysfunction.   vitamin E 400 UNIT capsule Take 400 Units by mouth daily.          Objective:   Physical Exam BP 132/70 (  BP Location: Left Arm, Patient Position: Sitting, Cuff Size: Normal)   Pulse 69   Temp 97.7 F (36.5 C) (Oral)   Resp 16   Ht 6\' 1"  (1.854 m)   Wt 261 lb (118.4 kg)   SpO2 94%   BMI 34.43 kg/m  General:   Well developed, NAD, see BMI.  HEENT:  Normocephalic . Face symmetric, atraumatic Lungs:  CTA B Normal respiratory effort, no intercostal retractions, no accessory muscle use. Heart: RRR,  no murmur.  No pretibial edema bilaterally  Skin: Multiple skin lesions on his back, most of them look like SKs Neurologic:  alert & oriented X3.  Speech normal, gait appropriate for age and unassisted Psych--  Cognition and judgment appear intact.  Cooperative with normal attention span and concentration.  Behavior appropriate. No anxious or depressed appearing.      Assessment & Plan:     Assessment DM, + Mild neuropathy on exam 03/2017, actos intolerant 01-2018 HTN Hyperlipidemia Hypothyroidism h/oThyroid cancer, nodularity, no mets, thyroidectomy 2013- sees Dr Buddy Duty yearly as off 02-2018 CV: --CHF/nonischemic cardiomyopathy dx 2011 --Ventricular tachycardia, 2011, normal coronaries, EF  ~15 -20 % -- s/p  biventricular ICD 2011--- replaced 04-2016 --CAD -Nonobstructive   by cath 08-2014 --LBBB  GI: --GERD --H/o anemia:  --Gastric ulcer per EGD 07-2010 --Colonoscopy 2011:4 polyps  H/o gout  PLAN DM: Continue farxiga, metformin, Januvia.  Improving his diet, he is avoiding processed CHO, praised!.  Last A1c very good.  No change CHF: Seems to be stable, continue carvedilol, Lasix, potassium, magnesium.  Check a BMP High cholesterol: On Lipitor, due for FLP, AST, ALT Hypothyroidism: on Synthroid, check a TSH Skin lesions: Multiple, recommend  dermatology referral. Preventive care: Due for a colonoscopy, plans to call and schedule. RTC CPX 4 months

## 2018-05-29 ENCOUNTER — Other Ambulatory Visit: Payer: Self-pay | Admitting: Internal Medicine

## 2018-06-08 ENCOUNTER — Ambulatory Visit (INDEPENDENT_AMBULATORY_CARE_PROVIDER_SITE_OTHER): Payer: 59

## 2018-06-08 ENCOUNTER — Telehealth: Payer: Self-pay

## 2018-06-08 DIAGNOSIS — Z95810 Presence of automatic (implantable) cardiac defibrillator: Secondary | ICD-10-CM | POA: Diagnosis not present

## 2018-06-08 DIAGNOSIS — I5022 Chronic systolic (congestive) heart failure: Secondary | ICD-10-CM

## 2018-06-08 NOTE — Telephone Encounter (Signed)
Remote ICM transmission received.  Attempted call to patient and no answer   

## 2018-06-08 NOTE — Progress Notes (Signed)
EPIC Encounter for ICM Monitoring  Patient Name: Mike Jimenez is a 67 y.o. male Date: 06/08/2018 Primary Care Physican: Colon Branch, MD Primary Cardiologist:Taylor Electrophysiologist: Lovena Le Dry Weight:unknown Bi-V Pacing: 98.1%                                                  Attempted call to patient and unable to reach.  Left detailed message, per DPR, regarding transmission.  Transmission reviewed.    Thoracic impedance returned to normal since last ICM remote transmission on 05/01/2018.  Prescribed dosage: Furosemide 20 mg 1tablet (20 mg total) daily. Potassium 10 mEq 1tablet (10 mEq total) daily  Labs: 05/17/2018 Creatinine 1.14, BUN 14, Potassium 4.2, Sodium 141, EGFR 68.24 03/23/2018 Creatinine1.17, BUN20, Potassium4.1, Sodium141, DBZM08.02  Recommendations: Left voice mail with ICM number and encouraged to call if experiencing any fluid symptoms.  Follow-up plan: ICM clinic phone appointment on 07/10/2018.    Copy of ICM check sent to Dr. Lovena Le.   3 month ICM trend: 06/08/2018    1 Year ICM trend:       Rosalene Billings, RN 06/08/2018 12:31 PM

## 2018-06-22 ENCOUNTER — Ambulatory Visit (INDEPENDENT_AMBULATORY_CARE_PROVIDER_SITE_OTHER): Payer: 59 | Admitting: *Deleted

## 2018-06-22 ENCOUNTER — Other Ambulatory Visit: Payer: Self-pay | Admitting: Internal Medicine

## 2018-06-22 DIAGNOSIS — I429 Cardiomyopathy, unspecified: Secondary | ICD-10-CM | POA: Diagnosis not present

## 2018-06-22 NOTE — Progress Notes (Signed)
Remote ICD transmission.   

## 2018-06-23 ENCOUNTER — Other Ambulatory Visit: Payer: Self-pay | Admitting: Internal Medicine

## 2018-06-26 DIAGNOSIS — D225 Melanocytic nevi of trunk: Secondary | ICD-10-CM | POA: Diagnosis not present

## 2018-06-26 DIAGNOSIS — L821 Other seborrheic keratosis: Secondary | ICD-10-CM | POA: Diagnosis not present

## 2018-07-10 ENCOUNTER — Ambulatory Visit (INDEPENDENT_AMBULATORY_CARE_PROVIDER_SITE_OTHER): Payer: 59

## 2018-07-10 DIAGNOSIS — I5022 Chronic systolic (congestive) heart failure: Secondary | ICD-10-CM | POA: Diagnosis not present

## 2018-07-10 DIAGNOSIS — Z95810 Presence of automatic (implantable) cardiac defibrillator: Secondary | ICD-10-CM | POA: Diagnosis not present

## 2018-07-10 NOTE — Progress Notes (Signed)
EPIC Encounter for ICM Monitoring  Patient Name: Mike Jimenez is a 67 y.o. male Date: 07/10/2018 Primary Care Physican: Colon Branch, MD Primary Cardiologist:Taylor Electrophysiologist: Lovena Le Dry Weight:unknown Bi-V Pacing: 98.1%      Attempted call to patient and unable to reach.  Left detailed message, per DPR, regarding transmission.  Transmission reviewed.    Thoracic impedance almost at baseline normal but was abnormal suggesting fluid accumulation from 06/28/2018.  Prescribed dosage: Furosemide 20 mg 1tablet (20 mg total) daily. Potassium 10 mEq 1tablet (10 mEq total) daily  Labs: 05/22/2018 Creatinine 1.14, BUN 14, Potassium 4.2, Sodium 141, EGFR 68.24 03/23/2018 Creatinine1.17, BUN20, Potassium4.1, Sodium141, ZOXW96.04  Recommendations: Left voice mail with ICM number and encouraged to call if experiencing any fluid symptoms.   Follow-up plan: ICM clinic phone appointment on 08/10/2018.     Copy of ICM check sent to Dr. Lovena Le.   3 month ICM trend: 07/10/2018    1 Year ICM trend:       Rosalene Billings, RN 07/10/2018 12:03 PM

## 2018-07-11 ENCOUNTER — Telehealth: Payer: Self-pay

## 2018-07-11 NOTE — Telephone Encounter (Signed)
Remote ICM transmission received.  Attempted call to patient and left detailed message, per DPR, regarding transmission and next ICM scheduled for 08/10/2018.  Advised to return call for any fluid symptoms or questions.

## 2018-07-17 ENCOUNTER — Encounter: Payer: Self-pay | Admitting: Internal Medicine

## 2018-07-18 NOTE — Telephone Encounter (Signed)
Left voicemail for pt to call us back to set up appt with Dr. [name] this week.

## 2018-07-18 NOTE — Telephone Encounter (Signed)
Pt needs appt within the next few days. Please schedule at his convenience. Thank you.

## 2018-07-19 NOTE — Telephone Encounter (Signed)
Pt called [facility] and was scheduled for 07/20/18 @ 11:30

## 2018-07-20 ENCOUNTER — Encounter: Payer: Self-pay | Admitting: Internal Medicine

## 2018-07-20 ENCOUNTER — Ambulatory Visit (INDEPENDENT_AMBULATORY_CARE_PROVIDER_SITE_OTHER): Payer: 59 | Admitting: Internal Medicine

## 2018-07-20 VITALS — BP 142/80 | HR 81 | Temp 97.8°F | Resp 16 | Ht 73.0 in | Wt 261.0 lb

## 2018-07-20 DIAGNOSIS — I5022 Chronic systolic (congestive) heart failure: Secondary | ICD-10-CM | POA: Diagnosis not present

## 2018-07-20 DIAGNOSIS — I10 Essential (primary) hypertension: Secondary | ICD-10-CM | POA: Diagnosis not present

## 2018-07-20 MED ORDER — GABAPENTIN 100 MG PO CAPS: 200.0000 mg | ORAL_CAPSULE | Freq: Every day | ORAL | 3 refills | Status: DC

## 2018-07-20 NOTE — Progress Notes (Signed)
Subjective:    Patient ID: Mike Jimenez, male    DOB: Feb 27, 1951, 67 y.o.   MRN: [id number]  DOS:  07/20/2018 Type of visit - description : bp management Interval history: The patient had severe leg cramps at night, he healed Lasix and the cramps diminished significantly, went back on Lasix and the cramps become severe again. Has not taken Lasix for about 4 weeks. I am concerned because he has a history of CHF and also his BP has noted to be elevated since he stopped Lasix.  Wt Readings from Last 3 Encounters:  07/20/18 261 lb (118.4 kg)  05/22/18 261 lb (118.4 kg)  03/23/18 261 lb 8 oz (118.6 kg)    Review of Systems Denies shortness of breath, edema, chest pain or palpitations.  Past Medical History:  Diagnosis Date  . Anemia   . Arthritis    "knees" (09/05/2014)  . Automatic implantable cardioverter-defibrillator in situ   . CAD (coronary artery disease)    a. Mild nonobstructive CAD by cath 08/2014.  Marland Kitchen CHF (congestive heart failure) ([place])   . Childhood asthma   . Gastric ulcer 2011  . GERD (gastroesophageal reflux disease)   . History of gout   . Hyperlipidemia   . Hypertension   . LBBB (left bundle branch block)   . NICM (nonischemic cardiomyopathy) ([place])    a. Dx 2011 - presented with sustained VT. Normal coronaries at that time, EF 10-15% by cath and 20-25% by echo. b. s/p biventricular ICD 2011. c. CP 08/2014: mild nonobstructive CAD.  Marland Kitchen Obesity   . Postsurgical hypothyroidism   . Thyroid cancer ([place])    a. 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 Dr Harlow Asa. s/p thyroidectomy 2013.  . Torn meniscus   . Type II diabetes mellitus ([place])   . Ventricular tachycardia ([place])    a. Sustained VT 2011 s/p MDT [id number] Concert BiV ICD, ser# [id number].    Past Surgical History:  Procedure Laterality Date  . BI-VENTRICULAR IMPLANTABLE CARDIOVERTER DEFIBRILLATOR  (CRT-D)  03/2010  . CARDIAC CATHETERIZATION  03/2010  . EP IMPLANTABLE DEVICE N/A 05/21/2016   Procedure:  ICD Generator Changeout;  Surgeon: Evans Lance, MD;  Location: [facility];  Service: Cardiovascular;  Laterality: N/A;  . LEFT HEART CATHETERIZATION WITH CORONARY ANGIOGRAM N/A 09/06/2014   Procedure: LEFT HEART CATHETERIZATION WITH CORONARY ANGIOGRAM;  Surgeon: Burnell Blanks, MD;  Location: [facility];  Service: Cardiovascular;  Laterality: N/A;  . PTX     traumatic, age 67 months  . THYROIDECTOMY  12/10/2011   Procedure: TOTAL THYROIDECTOMY WITH CENTRAL COMPARTMENT DISSECTION;  Surgeon: Earnstine Regal, MD;  Location: [facility];  Service: General;  Laterality: N/A;  Total thyroidectomy with limited central compartment lymph node dissection.  Marland Kitchen VASECTOMY      Social History   Socioeconomic History  . Marital status: Married    Spouse name: Not on file  . Number of children: 2  . Years of education: Not on file  . Highest education level: Not on file  Occupational History  . Occupation: Warehouse work    Fish farm manager: [employer]  Social Needs  . Financial resource strain: Not on file  . Food insecurity:    Worry: Not on file    Inability: Not on file  . Transportation needs:    Medical: Not on file    Non-medical: Not on file  Tobacco Use  . Smoking status: Former Smoker    Packs/day: 2.50  Years: 2.00    Pack years: 5.00    Types: Cigarettes    Last attempt to quit: 11/29/1970    Years since quitting: 47.6  . Smokeless tobacco: Former Systems developer    Types: Chew  Substance and Sexual Activity  . Alcohol use: Yes    Comment: 09/05/2014 "2 drinks q couple months, if that  . Drug use: No  . Sexual activity: Yes  Lifestyle  . Physical activity:    Days per week: Not on file    Minutes per session: Not on file  . Stress: Not on file  Relationships  . Social connections:    Talks on phone: Not on file    Gets together: Not on file    Attends religious service: Not on file    Active member of club or organization: Not on file    Attends meetings of clubs or  organizations: Not on file    Relationship status: Not on file  . Intimate partner violence:    Fear of current or ex partner: Not on file    Emotionally abused: Not on file    Physically abused: Not on file    Forced sexual activity: Not on file  Other Topics Concern  . Not on file  Social History Narrative   Live w/ wife , daughter lives w/ them - in [place]   8 dogs, fish pond.   Job is very physical.   Does not routinely exercise.      Allergies as of 07/20/2018      Reactions   Losartan Other (See Comments)   Leg cramps   Ramipril Cough      Medication List        Accurate as of 07/20/18 11:20 AM. Always use your most recent med list.          acetaminophen 500 MG tablet Commonly known as:  TYLENOL Take 1,000 mg by mouth every 6 (six) hours as needed (pain).   allopurinol 100 MG tablet Commonly known as:  ZYLOPRIM Take 1 tablet (100 mg total) by mouth daily.   aspirin EC 81 MG tablet Take 1 tablet (81 mg total) by mouth daily.   atorvastatin 40 MG tablet Commonly known as:  LIPITOR Take 1 tablet (40 mg total) by mouth daily.   carvedilol 12.5 MG tablet Commonly known as:  COREG Take 1 tablet (12.5 mg total) by mouth daily.   dapagliflozin propanediol 5 MG Tabs tablet Commonly known as:  FARXIGA Take 5 mg by mouth daily.   furosemide 20 MG tablet Commonly known as:  LASIX Take 1 tablet (20 mg total) by mouth daily.   KLOR-CON M10 10 MEQ tablet Generic drug:  potassium chloride TAKE 1 TABLET BY MOUTH EVERY DAY   levothyroxine 200 MCG tablet Commonly known as:  SYNTHROID, LEVOTHROID Take 200 mcg by mouth daily before breakfast.   MAGNESIUM PO Take 1 tablet by mouth daily.   metFORMIN 1000 MG tablet Commonly known as:  GLUCOPHAGE Take 1 tablet (1,000 mg total) by mouth 2 (two) times daily with a meal.   multivitamin with minerals Tabs tablet Take 1 tablet by mouth daily.   sitaGLIPtin 100 MG tablet Commonly known as:  JANUVIA Take 1 tablet  (100 mg total) by mouth daily.   tadalafil 20 MG tablet Commonly known as:  ADCIRCA/CIALIS Take 1 tablet (20 mg total) by mouth daily as needed for erectile dysfunction.   vitamin E 400 UNIT capsule Take 400 Units by mouth daily.  Objective:   Physical Exam BP (!) 142/80 (BP Location: Right Arm, Patient Position: Sitting, Cuff Size: Normal)   Pulse 81   Temp 97.8 F (36.6 C) (Oral)   Resp 16   Ht 6\' 1"  (1.854 m)   Wt 261 lb (118.4 kg)   SpO2 98%   BMI 34.43 kg/m  General:   Well developed, NAD, see BMI.  HEENT:  Normocephalic . Face symmetric, atraumatic Lungs:  CTA B Normal respiratory effort, no intercostal retractions, no accessory muscle use. Heart: RRR,  no murmur.  No pretibial edema bilaterally  Skin: Multiple skin lesions on his back, most of them look like SKs Neurologic:  alert & oriented X3.  Speech normal, gait appropriate for age and unassisted Psych--  Cognition and judgment appear intact.  Cooperative with normal attention span and concentration.  Behavior appropriate. No anxious or depressed appearing.      Assessment & Plan:   Assessment DM, + Mild neuropathy on exam 03/2017, actos intolerant 01-2018 HTN Hyperlipidemia Hypothyroidism h/oThyroid cancer, nodularity, no mets, thyroidectomy 2013- sees Dr Buddy Duty yearly as off 02-2018 CV: --CHF/nonischemic cardiomyopathy dx 2011 --Ventricular tachycardia, 2011, normal coronaries, EF  ~15 -20 % -- s/p  biventricular ICD 2011--- replaced 04-2016 --CAD -Nonobstructive   by cath 08-2014 --LBBB  GI: --GERD --H/o anemia:  --Gastric ulcer per EGD 07-2010 --Colonoscopy 2011:4 polyps  H/o gout  PLAN HTN, CHF: Patient not taking Lasix due to severe nocturnal leg cramps that are much improved but not gone by holding Lasix. He does not have any evidence of fluid retention without Lasix but his blood pressure has been increase lately. If at all possible, I like the patient to continue Lasix. Plan:  Go back on Lasix, continue Mg and potassium.  To prevent leg cramps recommend stretching and start gabapentin.  We will also consider add B12 supplements on vitamin D. If intolerant to Lasix, will consider stop it and  increase carvedilol probably in consultation with cardiology. RTC 3 months.

## 2018-07-20 NOTE — Assessment & Plan Note (Signed)
HTN, CHF: Patient not taking Lasix due to severe nocturnal leg cramps that are much improved but not gone by holding Lasix. He does not have any evidence of fluid retention without Lasix but his blood pressure has been increase lately. If at all possible, I like the patient to continue Lasix. Plan: Go back on Lasix, continue Mg and potassium.  To prevent leg cramps recommend stretching and start gabapentin.  We will also consider add B12 supplements on vitamin D. If intolerant to Lasix, will consider stop it and  increase carvedilol probably in consultation with cardiology. RTC 3 months.

## 2018-07-20 NOTE — Patient Instructions (Addendum)
  GO TO THE FRONT DESK Schedule your next appointment for a checkup in 3 months  Go back on Lasix, potassium, magnesium.    Check the  blood pressure 2 or 3 times a  week   Be sure your blood pressure is between 110/65 and  135/85. If it is consistently higher or lower, let me know  To prevent leg cramps: Stretch your legs twice a day Start taking gabapentin 100 mg : 2 or 3 tablets at nighttime. If that is not working let me know.

## 2018-07-31 LAB — CUP PACEART REMOTE DEVICE CHECK
Battery Remaining Longevity: 85 mo
Battery Voltage: 2.98 V
Brady Statistic AP VP Percent: 3.92 %
Brady Statistic AP VS Percent: 0.09 %
Brady Statistic AS VP Percent: 94.53 %
Brady Statistic AS VS Percent: 1.47 %
Brady Statistic RA Percent Paced: 4 %
Brady Statistic RV Percent Paced: 71.72 %
HighPow Impedance: 47 Ohm
HighPow Impedance: 55 Ohm
Implantable Lead Implant Date: 20110516
Implantable Lead Implant Date: 20110516
Implantable Lead Location: 753859
Implantable Lead Model: 5076
Lead Channel Impedance Value: 323 Ohm
Lead Channel Impedance Value: 399 Ohm
Lead Channel Impedance Value: 399 Ohm
Lead Channel Impedance Value: 570 Ohm
Lead Channel Impedance Value: 874 Ohm
Lead Channel Pacing Threshold Amplitude: 1.125 V
Lead Channel Pacing Threshold Pulse Width: 0.4 ms
Lead Channel Pacing Threshold Pulse Width: 0.4 ms
Lead Channel Pacing Threshold Pulse Width: 0.4 ms
Lead Channel Sensing Intrinsic Amplitude: 11 mV
Lead Channel Sensing Intrinsic Amplitude: 2.25 mV
Lead Channel Setting Pacing Amplitude: 2.25 V
Lead Channel Setting Pacing Pulse Width: 0.4 ms
Lead Channel Setting Sensing Sensitivity: 0.3 mV
MDC IDC LEAD IMPLANT DT: 20110516
MDC IDC LEAD LOCATION: 753858
MDC IDC LEAD LOCATION: 753860
MDC IDC MSMT LEADCHNL LV PACING THRESHOLD AMPLITUDE: 0.875 V
MDC IDC MSMT LEADCHNL RA SENSING INTR AMPL: 2.25 mV
MDC IDC MSMT LEADCHNL RV IMPEDANCE VALUE: 266 Ohm
MDC IDC MSMT LEADCHNL RV PACING THRESHOLD AMPLITUDE: 0.875 V
MDC IDC MSMT LEADCHNL RV SENSING INTR AMPL: 11 mV
MDC IDC PG IMPLANT DT: 20170623
MDC IDC SESS DTM: 20190725071804
MDC IDC SET LEADCHNL LV PACING AMPLITUDE: 2 V
MDC IDC SET LEADCHNL LV PACING PULSEWIDTH: 0.4 ms
MDC IDC SET LEADCHNL RV PACING AMPLITUDE: 2.5 V

## 2018-08-10 ENCOUNTER — Ambulatory Visit (INDEPENDENT_AMBULATORY_CARE_PROVIDER_SITE_OTHER): Payer: 59

## 2018-08-10 DIAGNOSIS — I5022 Chronic systolic (congestive) heart failure: Secondary | ICD-10-CM | POA: Diagnosis not present

## 2018-08-10 DIAGNOSIS — Z95810 Presence of automatic (implantable) cardiac defibrillator: Secondary | ICD-10-CM | POA: Diagnosis not present

## 2018-08-11 ENCOUNTER — Other Ambulatory Visit: Payer: Self-pay | Admitting: Internal Medicine

## 2018-08-11 NOTE — Progress Notes (Signed)
EPIC Encounter for ICM Monitoring  Patient Name: Mike Jimenez is a 67 y.o. male Date: 08/11/2018 Primary Care Physican: Colon Branch, MD Primary Cardiologist:Taylor Electrophysiologist: Lovena Le Dry Weight:260 lbs Bi-V Pacing: 98.0%      Heart Failure questions reviewed, pt asymptomatic.  Per 8/19 chart message to Dr [name], patient stopped Furosemide x 4 weeks due to night leg cramps and 8/22 office visit with Dr [name], it was recommended he restart Lasix.   Thoracic impedance abnormal suggesting fluid accumulation starting 08/01/2018 but trending just under baseline.  Prescribed: Furosemide 20 mg 1tablet (20 mg total) daily. Potassium 10 mEq 1tablet (10 mEq total) daily  Labs: 05/22/2018 Creatinine 1.14, BUN 14, Potassium 4.2, Sodium 141, EGFR 68.24 03/23/2018 Creatinine1.17, BUN20, Potassium4.1, Sodium141, AFHS30.74  Recommendations: No changes.   Encouraged to call for fluid symptoms.  Best time to call is between 10-12.  Follow-up plan: ICM clinic phone appointment on 08/31/2018 to recheck fluid levels.  Recall for Dr Lovena Le office visit 10/11/2018.    Copy of ICM check sent to Dr. Lovena Le.   3 month ICM trend: 08/11/2018    1 Year ICM trend:       Rosalene Billings, RN 08/11/2018 10:00 AM

## 2018-08-26 ENCOUNTER — Other Ambulatory Visit: Payer: Self-pay | Admitting: Internal Medicine

## 2018-08-31 ENCOUNTER — Ambulatory Visit (INDEPENDENT_AMBULATORY_CARE_PROVIDER_SITE_OTHER): Payer: 59

## 2018-08-31 DIAGNOSIS — Z95810 Presence of automatic (implantable) cardiac defibrillator: Secondary | ICD-10-CM

## 2018-08-31 DIAGNOSIS — I5022 Chronic systolic (congestive) heart failure: Secondary | ICD-10-CM

## 2018-08-31 NOTE — Progress Notes (Signed)
EPIC Encounter for ICM Monitoring  Patient Name: Mike Jimenez is a 67 y.o. male Date: 08/31/2018 Primary Care Physican: Colon Branch, MD Primary Cardiologist:Taylor Electrophysiologist: Lovena Le Dry Weight:260 - 265 lbs Bi-V Pacing: 98.0%        Heart Failure questions reviewed, pt asymptomatic.   Thoracic impedance returned to normal since last remote transmission on 08/10/2018.  Prescribed: Furosemide 20 mg 1tablet (20 mg total) daily. Potassium 10 mEq 1tablet (10 mEq total) daily  Labs: 05/22/2018 Creatinine 1.14, BUN 14, Potassium 4.2, Sodium 141, EGFR 68.24 03/23/2018 Creatinine1.17, BUN20, Potassium4.1, Sodium141, TVMT97.18  Recommendations: No changes.  Encouraged to call for fluid symptoms.  Follow-up plan: ICM clinic phone appointment on 09/21/2018.    Copy of ICM check sent to Dr. Lovena Le.   3 month ICM trend: 08/31/2018    1 Year ICM trend:       Rosalene Billings, RN 08/31/2018 8:58 AM

## 2018-09-01 ENCOUNTER — Other Ambulatory Visit: Payer: Self-pay | Admitting: Internal Medicine

## 2018-09-21 ENCOUNTER — Ambulatory Visit (INDEPENDENT_AMBULATORY_CARE_PROVIDER_SITE_OTHER): Payer: 59 | Admitting: *Deleted

## 2018-09-21 ENCOUNTER — Ambulatory Visit (INDEPENDENT_AMBULATORY_CARE_PROVIDER_SITE_OTHER): Payer: 59

## 2018-09-21 ENCOUNTER — Telehealth: Payer: Self-pay

## 2018-09-21 DIAGNOSIS — I429 Cardiomyopathy, unspecified: Secondary | ICD-10-CM

## 2018-09-21 DIAGNOSIS — I5022 Chronic systolic (congestive) heart failure: Secondary | ICD-10-CM | POA: Diagnosis not present

## 2018-09-21 DIAGNOSIS — Z95810 Presence of automatic (implantable) cardiac defibrillator: Secondary | ICD-10-CM | POA: Diagnosis not present

## 2018-09-21 NOTE — Progress Notes (Signed)
Remote ICD transmission.   

## 2018-09-21 NOTE — Telephone Encounter (Signed)
Remote ICM transmission received.  Attempted call to patient regarding ICM remote transmission and left detailed message, per DPR, with next ICM remote transmission date of 10/23/2018.  Advised to return call for any fluid symptoms or questions.    

## 2018-09-21 NOTE — Progress Notes (Signed)
EPIC Encounter for ICM Monitoring  Patient Name: Mike Jimenez is a 67 y.o. male Date: 09/21/2018 Primary Care Physican: Colon Branch, MD Primary Cardiologist:Taylor Electrophysiologist: Lovena Le Dry Weight: Previous weight 260 - 265 lbs Bi-V Pacing: 97.9%      Attempted call to patient and unable to reach.  Left detailed message, per DPR, regarding transmission.  Transmission reviewed.    Thoracic impedance normal.   Prescribed: Furosemide 20 mg 1tablet (20 mg total) daily. Potassium 10 mEq 1tablet (10 mEq total) daily  Labs: 05/22/2018 Creatinine 1.14, BUN 14, Potassium 4.2, Sodium 141, EGFR 68.24 03/23/2018 Creatinine1.17, BUN20, Potassium4.1, Sodium141, GREU79.98  Recommendations: Left voice mail with ICM number and encouraged to call if experiencing any fluid symptoms.  Follow-up plan: ICM clinic phone appointment on 10/23/2018.    Copy of ICM check sent to Dr. Lovena Le.   3 month ICM trend: 09/21/2018    1 Year ICM trend:       Rosalene Billings, RN 09/21/2018 11:07 AM

## 2018-10-04 LAB — MICROALBUMIN, URINE: MICROALB UR: 29.81

## 2018-10-04 LAB — HEMOGLOBIN A1C: Hgb A1c MFr Bld: 7.5 — AB (ref 4.0–6.0)

## 2018-10-04 LAB — HM DIABETES FOOT EXAM

## 2018-10-04 LAB — TSH: TSH: 7.15 — AB (ref 0.41–5.90)

## 2018-10-13 LAB — CUP PACEART REMOTE DEVICE CHECK
Battery Remaining Longevity: 80 mo
Brady Statistic AS VS Percent: 1.73 %
Brady Statistic RV Percent Paced: 63.22 %
HIGH POWER IMPEDANCE MEASURED VALUE: 47 Ohm
HighPow Impedance: 58 Ohm
Implantable Lead Implant Date: 20110516
Implantable Lead Location: 753858
Implantable Lead Location: 753859
Implantable Pulse Generator Implant Date: 20170623
Lead Channel Impedance Value: 380 Ohm
Lead Channel Impedance Value: 437 Ohm
Lead Channel Pacing Threshold Amplitude: 0.875 V
Lead Channel Pacing Threshold Amplitude: 1 V
Lead Channel Pacing Threshold Amplitude: 1.125 V
Lead Channel Pacing Threshold Pulse Width: 0.4 ms
Lead Channel Sensing Intrinsic Amplitude: 10 mV
Lead Channel Sensing Intrinsic Amplitude: 10 mV
Lead Channel Sensing Intrinsic Amplitude: 2.5 mV
Lead Channel Sensing Intrinsic Amplitude: 2.5 mV
Lead Channel Setting Pacing Amplitude: 2.25 V
Lead Channel Setting Pacing Amplitude: 2.5 V
Lead Channel Setting Sensing Sensitivity: 0.3 mV
MDC IDC LEAD IMPLANT DT: 20110516
MDC IDC LEAD IMPLANT DT: 20110516
MDC IDC LEAD LOCATION: 753860
MDC IDC MSMT BATTERY VOLTAGE: 2.98 V
MDC IDC MSMT LEADCHNL LV IMPEDANCE VALUE: 551 Ohm
MDC IDC MSMT LEADCHNL LV IMPEDANCE VALUE: 874 Ohm
MDC IDC MSMT LEADCHNL RA IMPEDANCE VALUE: 399 Ohm
MDC IDC MSMT LEADCHNL RA PACING THRESHOLD PULSEWIDTH: 0.4 ms
MDC IDC MSMT LEADCHNL RV IMPEDANCE VALUE: 437 Ohm
MDC IDC MSMT LEADCHNL RV PACING THRESHOLD PULSEWIDTH: 0.4 ms
MDC IDC SESS DTM: 20191024083624
MDC IDC SET LEADCHNL LV PACING PULSEWIDTH: 0.4 ms
MDC IDC SET LEADCHNL RA PACING AMPLITUDE: 2 V
MDC IDC SET LEADCHNL RV PACING PULSEWIDTH: 0.4 ms
MDC IDC STAT BRADY AP VP PERCENT: 0.81 %
MDC IDC STAT BRADY AP VS PERCENT: 0.03 %
MDC IDC STAT BRADY AS VP PERCENT: 97.44 %
MDC IDC STAT BRADY RA PERCENT PACED: 0.83 %

## 2018-10-16 ENCOUNTER — Encounter: Payer: Self-pay | Admitting: Internal Medicine

## 2018-10-23 ENCOUNTER — Ambulatory Visit (INDEPENDENT_AMBULATORY_CARE_PROVIDER_SITE_OTHER): Payer: 59

## 2018-10-23 ENCOUNTER — Encounter: Payer: Self-pay | Admitting: Internal Medicine

## 2018-10-23 ENCOUNTER — Ambulatory Visit (INDEPENDENT_AMBULATORY_CARE_PROVIDER_SITE_OTHER): Payer: 59 | Admitting: Internal Medicine

## 2018-10-23 VITALS — BP 122/74 | HR 73 | Temp 98.3°F | Resp 16 | Ht 73.0 in | Wt 257.4 lb

## 2018-10-23 DIAGNOSIS — I428 Other cardiomyopathies: Secondary | ICD-10-CM

## 2018-10-23 DIAGNOSIS — I5022 Chronic systolic (congestive) heart failure: Secondary | ICD-10-CM | POA: Diagnosis not present

## 2018-10-23 DIAGNOSIS — E118 Type 2 diabetes mellitus with unspecified complications: Secondary | ICD-10-CM | POA: Diagnosis not present

## 2018-10-23 DIAGNOSIS — Z95810 Presence of automatic (implantable) cardiac defibrillator: Secondary | ICD-10-CM

## 2018-10-23 LAB — BASIC METABOLIC PANEL
BUN: 21 mg/dL (ref 6–23)
CALCIUM: 9.5 mg/dL (ref 8.4–10.5)
CO2: 27 mEq/L (ref 19–32)
CREATININE: 1.12 mg/dL (ref 0.40–1.50)
Chloride: 104 mEq/L (ref 96–112)
GFR: 69.56 mL/min (ref >60.00)
Glucose, Bld: 168 mg/dL — ABNORMAL HIGH (ref 70–99)
Potassium: 4.1 mEq/L (ref 3.5–5.1)
Sodium: 142 mEq/L (ref 135–145)

## 2018-10-23 LAB — MAGNESIUM: MAGNESIUM: 1.8 mg/dL (ref 1.5–2.5)

## 2018-10-23 MED ORDER — ZOSTER VAC RECOMB ADJUVANTED 50 MCG/0.5ML IM SUSR: 0.5000 mL | Freq: Once | INTRAMUSCULAR | 1 refills | Status: AC

## 2018-10-23 NOTE — Progress Notes (Signed)
EPIC Encounter for ICM Monitoring  Patient Name: Mike Jimenez is a 67 y.o. male Date: 10/23/2018 Primary Care Physican: Colon Branch, MD Primary Cardiologist:Taylor Electrophysiologist: Santina Evans Pacing: 98% Last Weight 260- 265lbs                                          Heart failure questions reviewed.  Patient reported episode at work that he felt lightheaded but thinks it was related to low blood sugar.  He ate some food and felt fine after that.  Januvia has been stopped and now on Trulicity for Diabetes    Thoracic impedance normal.   Prescribed: Furosemide 20 mg 1tablet (20 mg total) daily. Potassium 10 mEq 1tablet (10 mEq total) daily  Labs: 05/22/2018 Creatinine 1.14, BUN 14, Potassium 4.2, Sodium 141, EGFR 68.24 03/23/2018 Creatinine1.17, BUN20, Potassium4.1, Sodium141, ZYSA63.01  Recommendations: Left voice mail with ICM number and encouraged to call if experiencing any fluid symptoms.  Follow-up plan: ICM clinic phone appointment on 11/23/2018.    Copy of ICM check sent to Dr. Lovena Le.   3 month ICM trend: 10/23/2018    1 Year ICM trend:       Rosalene Billings, RN 10/23/2018 9:07 AM

## 2018-10-23 NOTE — Patient Instructions (Addendum)
Please schedule a Medicare Wellness with [employer]    GO TO THE LAB : Get the blood work     GO TO THE FRONT DESK Schedule your next appointment for a  Physical exam 4-5 months

## 2018-10-23 NOTE — Progress Notes (Signed)
Pre visit review using our clinic review tool, if applicable. No additional management support is needed unless otherwise documented below in the visit note. 

## 2018-10-23 NOTE — Progress Notes (Signed)
Subjective:    Patient ID: Mike Jimenez, male    DOB: 03/28/1951, 67 y.o.   MRN: [id number]  DOS:  10/23/2018 Type of visit - description : rov Since the last OV , he is doing well.  Good compliance with medication. Had leg cramps, they are decrease and tolerable. He is taking gabapentin. He is due for a colonoscopy and plans to proceed DM and hypothyroidism: Per endocrinology DJD: Still has occasional hip pain.   Review of Systems A month ago had a couple of episodes of mild dizziness and palpitation.   No associated with LOC, chest pain, difficulty breathing or diaphoresis.  These happened right before lunch at work, he suspects it might have been low sugar although he has not check his CBGs.  Past Medical History:  Diagnosis Date  . Anemia   . Arthritis    "knees" (09/05/2014)  . Automatic implantable cardioverter-defibrillator in situ   . CAD (coronary artery disease)    a. Mild nonobstructive CAD by cath 08/2014.  Marland Kitchen CHF (congestive heart failure) ([place])   . Childhood asthma   . Gastric ulcer 2011  . GERD (gastroesophageal reflux disease)   . History of gout   . Hyperlipidemia   . Hypertension   . LBBB (left bundle branch block)   . NICM (nonischemic cardiomyopathy) ([place])    a. Dx 2011 - presented with sustained VT. Normal coronaries at that time, EF 10-15% by cath and 20-25% by echo. b. s/p biventricular ICD 2011. c. CP 08/2014: mild nonobstructive CAD.  Marland Kitchen Obesity   . Postsurgical hypothyroidism   . Thyroid cancer ([place])    a. 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 Dr Harlow Asa. s/p thyroidectomy 2013.  . Torn meniscus   . Type II diabetes mellitus ([place])   . Ventricular tachycardia ([place])    a. Sustained VT 2011 s/p MDT [id number] Concert BiV ICD, ser# [id number].    Past Surgical History:  Procedure Laterality Date  . BI-VENTRICULAR IMPLANTABLE CARDIOVERTER DEFIBRILLATOR  (CRT-D)  03/2010  . CARDIAC CATHETERIZATION  03/2010  . EP IMPLANTABLE DEVICE N/A  05/21/2016   Procedure:  ICD Generator Changeout;  Surgeon: Evans Lance, MD;  Location: [facility];  Service: Cardiovascular;  Laterality: N/A;  . LEFT HEART CATHETERIZATION WITH CORONARY ANGIOGRAM N/A 09/06/2014   Procedure: LEFT HEART CATHETERIZATION WITH CORONARY ANGIOGRAM;  Surgeon: Burnell Blanks, MD;  Location: [facility] LAB;  Service: Cardiovascular;  Laterality: N/A;  . PTX     traumatic, age 28 months  . THYROIDECTOMY  12/10/2011   Procedure: TOTAL THYROIDECTOMY WITH CENTRAL COMPARTMENT DISSECTION;  Surgeon: Earnstine Regal, MD;  Location: [facility];  Service: General;  Laterality: N/A;  Total thyroidectomy with limited central compartment lymph node dissection.  Marland Kitchen VASECTOMY      Social History   Socioeconomic History  . Marital status: Married    Spouse name: Not on file  . Number of children: 2  . Years of education: Not on file  . Highest education level: Not on file  Occupational History  . Occupation: Warehouse work    Fish farm manager: [employer]  Social Needs  . Financial resource strain: Not on file  . Food insecurity:    Worry: Not on file    Inability: Not on file  . Transportation needs:    Medical: Not on file    Non-medical: Not on file  Tobacco Use  . Smoking status: Former Smoker    Packs/day: 2.50    Years:  2.00    Pack years: 5.00    Types: Cigarettes    Last attempt to quit: 11/29/1970    Years since quitting: 47.9  . Smokeless tobacco: Former Systems developer    Types: Chew  Substance and Sexual Activity  . Alcohol use: Yes    Comment: 09/05/2014 "2 drinks q couple months, if that  . Drug use: No  . Sexual activity: Yes  Lifestyle  . Physical activity:    Days per week: Not on file    Minutes per session: Not on file  . Stress: Not on file  Relationships  . Social connections:    Talks on phone: Not on file    Gets together: Not on file    Attends religious service: Not on file    Active member of club or organization: Not on file    Attends meetings of  clubs or organizations: Not on file    Relationship status: Not on file  . Intimate partner violence:    Fear of current or ex partner: Not on file    Emotionally abused: Not on file    Physically abused: Not on file    Forced sexual activity: Not on file  Other Topics Concern  . Not on file  Social History Narrative   Live w/ wife , daughter lives w/ them - in [place]   8 dogs, fish pond.   Job is very physical.   Does not routinely exercise.      Allergies as of 10/23/2018      Reactions   Losartan Other (See Comments)   Leg cramps   Ramipril Cough      Medication List        Accurate as of 10/23/18 11:59 PM. Always use your most recent med list.          acetaminophen 500 MG tablet Commonly known as:  TYLENOL Take 1,000 mg by mouth every 6 (six) hours as needed (pain).   allopurinol 100 MG tablet Commonly known as:  ZYLOPRIM TAKE 1 TABLET BY MOUTH EVERY DAY   aspirin EC 81 MG tablet Take 1 tablet (81 mg total) by mouth daily.   atorvastatin 40 MG tablet Commonly known as:  LIPITOR Take 1 tablet (40 mg total) by mouth daily.   carvedilol 12.5 MG tablet Commonly known as:  COREG Take 1 tablet (12.5 mg total) by mouth daily.   dapagliflozin propanediol 5 MG Tabs tablet Commonly known as:  FARXIGA Take 5 mg by mouth daily.   furosemide 20 MG tablet Commonly known as:  LASIX Take 1 tablet (20 mg total) by mouth daily.   gabapentin 100 MG capsule Commonly known as:  NEURONTIN Take 2-3 capsules (200-300 mg total) by mouth at bedtime.   KLOR-CON M10 10 MEQ tablet Generic drug:  potassium chloride TAKE 1 TABLET BY MOUTH EVERY DAY   levothyroxine 112 MCG tablet Commonly known as:  SYNTHROID, LEVOTHROID Take 224 mcg by mouth daily before breakfast.   levothyroxine 200 MCG tablet Commonly known as:  SYNTHROID, LEVOTHROID Take 200 mcg by mouth daily before breakfast.   MAGNESIUM PO Take 1 tablet by mouth daily.   metFORMIN 1000 MG tablet Commonly known  as:  GLUCOPHAGE Take 1 tablet (1,000 mg total) by mouth 2 (two) times daily with a meal.   multivitamin with minerals Tabs tablet Take 1 tablet by mouth daily.   tadalafil 20 MG tablet Commonly known as:  ADCIRCA/CIALIS Take 1 tablet (20 mg total) by mouth daily  as needed for erectile dysfunction.   TRULICITY 8.10 FB/5.1WC Sopn Generic drug:  Dulaglutide Inject 0.75 mg into the skin once a week.   vitamin E 400 UNIT capsule Take 400 Units by mouth daily.   Zoster Vaccine Adjuvanted injection Commonly known as:  SHINGRIX Inject 0.5 mLs into the muscle once for 1 dose.           Objective:   Physical Exam BP 122/74 (BP Location: Left Arm, Patient Position: Sitting, Cuff Size: Small)   Pulse 73   Temp 98.3 F (36.8 C) (Oral)   Resp 16   Ht 6\' 1"  (1.854 m)   Wt 257 lb 6 oz (116.7 kg)   SpO2 95%   BMI 33.96 kg/m  General:   Well developed, NAD, BMI noted. HEENT:  Normocephalic . Face symmetric, atraumatic Lungs:  CTA B Normal respiratory effort, no intercostal retractions, no accessory muscle use. Heart: RRR,  no murmur.  No pretibial edema bilaterally  Skin: Not pale. Not jaundice Neurologic:  alert & oriented X3.  Speech normal, gait appropriate for age and unassisted Psych--  Cognition and judgment appear intact.  Cooperative with normal attention span and concentration.  Behavior appropriate. No anxious or depressed appearing.      Assessment & Plan:   Assessment DM,  actos intolerant 01-2018 + Mild neuropathy on exam 03/2017, HTN Hyperlipidemia Hypothyroidism h/oThyroid cancer, nodularity, no mets, thyroidectomy 2013- sees Dr Buddy Duty yearly as off 02-2018 CV: --CHF/nonischemic cardiomyopathy dx 2011 --Ventricular tachycardia, 2011, normal coronaries, EF  ~15 -20 % -- s/p  biventricular ICD 2011--- replaced 04-2016 --CAD -Nonobstructive   by cath 08-2014 --LBBB  GI: --GERD --H/o anemia:  --Gastric ulcer per EGD 07-2010 --Colonoscopy 2011:4 polyps    DJD (hip pain, XRs dosne 2017) H/o gout  PLAN DM: Per endo, Januvia was stopped, now on Trulicity, Farxiga. Hypothyroidism: Medications adjusted by endocrine. HTN, CHF: Since the last visit, he is taking Lasix, potassium, magnesium, carvedilol.  He complained that of leg cramps, they are decreased, probably due to taking gabapentin.  Will check a BMP and magnesium. He had a couple of episodes of dizziness and palpitations a month ago, message sent to ICD RN DJD: Has mild hip pain.  On Tylenol as needed. Preventive care: Recommend Shingrix at the pharmacy RTC 4 to 5 months CPX

## 2018-10-24 NOTE — Assessment & Plan Note (Signed)
DM: Per endo, Januvia was stopped, now on Trulicity, Farxiga. Hypothyroidism: Medications adjusted by endocrine. HTN, CHF: Since the last visit, he is taking Lasix, potassium, magnesium, carvedilol.  He complained that of leg cramps, they are decreased, probably due to taking gabapentin.  Will check a BMP and magnesium. He had a couple of episodes of dizziness and palpitations a month ago, message sent to ICD RN DJD: Has mild hip pain.  On Tylenol as needed. Preventive care: Recommend Shingrix at the pharmacy RTC 4 to 5 months CPX

## 2018-11-16 ENCOUNTER — Telehealth: Payer: Self-pay | Admitting: *Deleted

## 2018-11-16 NOTE — Telephone Encounter (Signed)
Received Medical records from [facility]; forwarded to provider/SLS 12/19

## 2018-11-21 ENCOUNTER — Encounter: Payer: Self-pay | Admitting: Internal Medicine

## 2018-11-23 ENCOUNTER — Ambulatory Visit (INDEPENDENT_AMBULATORY_CARE_PROVIDER_SITE_OTHER): Payer: 59

## 2018-11-23 DIAGNOSIS — Z95810 Presence of automatic (implantable) cardiac defibrillator: Secondary | ICD-10-CM | POA: Diagnosis not present

## 2018-11-23 DIAGNOSIS — I5022 Chronic systolic (congestive) heart failure: Secondary | ICD-10-CM

## 2018-11-23 NOTE — Progress Notes (Signed)
EPIC Encounter for ICM Monitoring  Patient Name: Mike Jimenez is a 67 y.o. male Date: 11/23/2018 Primary Care Physican: Colon Branch, MD Primary Cardiologist:Taylor Electrophysiologist: Santina Evans Pacing: 98.2% Last [id number] 265lbs   Transmission reviewed.   Thoracic impedance normal.   Prescribed:Furosemide 20 mg 1tablet (20 mg total) daily. Potassium 10 mEq 1tablet (10 mEq total) daily  Labs: 05/22/2018 Creatinine 1.14, BUN 14, Potassium 4.2, Sodium 141, EGFR 68.24 03/23/2018 Creatinine1.17, BUN20, Potassium4.1, Sodium141, JRKU84.05  Recommendations: None  Follow-up plan: ICM clinic phone appointment on1/27/2020  Copy of ICM check sent to Dr.Taylor.   3 month ICM trend: 11/23/2018    1 Year ICM trend:       Rosalene Billings, RN 11/23/2018 4:22 PM

## 2018-12-06 ENCOUNTER — Other Ambulatory Visit: Payer: Self-pay | Admitting: Internal Medicine

## 2018-12-25 ENCOUNTER — Ambulatory Visit (INDEPENDENT_AMBULATORY_CARE_PROVIDER_SITE_OTHER): Payer: 59

## 2018-12-25 ENCOUNTER — Encounter: Payer: Self-pay | Admitting: Internal Medicine

## 2018-12-25 ENCOUNTER — Ambulatory Visit (INDEPENDENT_AMBULATORY_CARE_PROVIDER_SITE_OTHER): Payer: 59 | Admitting: Internal Medicine

## 2018-12-25 VITALS — BP 136/70 | HR 65 | Temp 98.2°F | Resp 16 | Ht 73.0 in | Wt 259.2 lb

## 2018-12-25 DIAGNOSIS — I5022 Chronic systolic (congestive) heart failure: Secondary | ICD-10-CM

## 2018-12-25 DIAGNOSIS — Z0000 Encounter for general adult medical examination without abnormal findings: Secondary | ICD-10-CM

## 2018-12-25 DIAGNOSIS — I429 Cardiomyopathy, unspecified: Secondary | ICD-10-CM

## 2018-12-25 LAB — COMPREHENSIVE METABOLIC PANEL
ALBUMIN: 4.5 g/dL (ref 3.5–5.2)
ALT: 14 U/L (ref 0–53)
AST: 15 U/L (ref 0–37)
Alkaline Phosphatase: 84 U/L (ref 39–117)
BUN: 17 mg/dL (ref 6–23)
CHLORIDE: 104 meq/L (ref 96–112)
CO2: 27 mEq/L (ref 19–32)
Calcium: 9.1 mg/dL (ref 8.4–10.5)
Creatinine, Ser: 1.17 mg/dL (ref 0.40–1.50)
GFR: 62.2 mL/min (ref >60.00)
Glucose, Bld: 133 mg/dL — ABNORMAL HIGH (ref 70–99)
POTASSIUM: 4.2 meq/L (ref 3.5–5.1)
Sodium: 141 mEq/L (ref 135–145)
Total Bilirubin: 0.5 mg/dL (ref 0.2–1.2)
Total Protein: 7.2 g/dL (ref 6.0–8.3)

## 2018-12-25 LAB — LDL CHOLESTEROL, DIRECT: Direct LDL: 97 mg/dL

## 2018-12-25 LAB — CBC WITH DIFFERENTIAL/PLATELET
BASOS ABS: 0 10*3/uL (ref 0.0–0.1)
Basophils Relative: 0.5 % (ref 0.0–3.0)
EOS ABS: 0.2 10*3/uL (ref 0.0–0.7)
Eosinophils Relative: 3.2 % (ref 0.0–5.0)
HCT: 42.7 % (ref 39.0–52.0)
Hemoglobin: 14.7 g/dL (ref 13.0–17.0)
LYMPHS ABS: 0.9 10*3/uL (ref 0.7–4.0)
Lymphocytes Relative: 16.3 % (ref 12.0–46.0)
MCHC: 34.3 g/dL (ref 30.0–36.0)
MCV: 90.3 fl (ref 78.0–100.0)
MONO ABS: 0.5 10*3/uL (ref 0.1–1.0)
Monocytes Relative: 9.5 % (ref 3.0–12.0)
NEUTROS PCT: 70.5 % (ref 43.0–77.0)
Neutro Abs: 4 10*3/uL (ref 1.4–7.7)
Platelets: 255 10*3/uL (ref 150.0–400.0)
RBC: 4.74 Mil/uL (ref 4.22–5.81)
RDW: 13.4 % (ref 11.5–15.5)
WBC: 5.6 10*3/uL (ref 4.0–10.5)

## 2018-12-25 LAB — LIPID PANEL
CHOL/HDL RATIO: 5
Cholesterol: 162 mg/dL (ref 0–200)
HDL: 34.8 mg/dL — AB (ref >39.00)
NonHDL: 127.39
Triglycerides: 232 mg/dL — ABNORMAL HIGH (ref 0.0–149.0)
VLDL: 46.4 mg/dL — AB (ref 0.0–40.0)

## 2018-12-25 NOTE — Patient Instructions (Addendum)
Please schedule Medicare Wellness with [facility].   Per our records: you are due for an eye exam- please contact your eye doctor for an appointment. Please have them send [place] the office visit notes- our fax number is [phone].  GO TO THE LAB : Get the blood work     GO TO THE FRONT DESK Schedule your next appointment  A physical exam in 1 year    Hip pain: ICE Visit the [facility] web site for stretching exercises   [email or web address]  Tylenol  500 mg OTC 2 tabs a day every 8 hours as needed for pain   Check the  blood pressure 2 or 3 times a month   Be sure your blood pressure is between 110/65 and  135/85. If it is consistently higher or lower, let me know   HOW TO TAKE YOUR BLOOD PRESSURE:   Rest 5 minutes before taking your blood pressure.   Don't smoke or drink caffeinated beverages for at least 30 minutes before.   Take your blood pressure before (not after) you eat.   Sit comfortably with your back supported and both feet on the floor (don't cross your legs).   Elevate your arm to heart level on a table or a desk.   Use the proper sized cuff. It should fit smoothly and snugly around your bare upper arm. There should be enough room to slip a fingertip under the cuff. The bottom edge of the cuff should be 1 inch above the crease of the elbow.   Ideally, take 3 measurements at one sitting and record the average.

## 2018-12-25 NOTE — Progress Notes (Signed)
Subjective:    Patient ID: Mike Jimenez, male    DOB: 10/08/1951, 68 y.o.   MRN: [id number]  DOS:  12/25/2018 Type of visit - description: cpx No major concerns   Review of Systems Occasionally pain at the lateral aspect of the right hip more so than the left. Usually after work, he works standing up sometimes for 10 hours a day. Denies back pain or lower extremity paresthesias Location L>R middle finger trigger phenomena.  Not bothersome.  Other than above, a 14 point review of systems is negative    Past Medical History:  Diagnosis Date  . Anemia   . Arthritis    "knees" (09/05/2014)  . Automatic implantable cardioverter-defibrillator in situ   . CAD (coronary artery disease)    a. Mild nonobstructive CAD by cath 08/2014.  Marland Kitchen CHF (congestive heart failure) ([place])   . Childhood asthma   . Gastric ulcer 2011  . GERD (gastroesophageal reflux disease)   . History of gout   . Hyperlipidemia   . Hypertension   . LBBB (left bundle branch block)   . NICM (nonischemic cardiomyopathy) ([place])    a. Dx 2011 - presented with sustained VT. Normal coronaries at that time, EF 10-15% by cath and 20-25% by echo. b. s/p biventricular ICD 2011. c. CP 08/2014: mild nonobstructive CAD.  Marland Kitchen Obesity   . Postsurgical hypothyroidism   . Thyroid cancer ([place])    a. 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 Dr Harlow Asa. s/p thyroidectomy 2013.  . Torn meniscus   . Type II diabetes mellitus ([place])   . Ventricular tachycardia ([place])    a. Sustained VT 2011 s/p MDT [id number] Concert BiV ICD, ser# [id number].    Past Surgical History:  Procedure Laterality Date  . BI-VENTRICULAR IMPLANTABLE CARDIOVERTER DEFIBRILLATOR  (CRT-D)  03/2010  . CARDIAC CATHETERIZATION  03/2010  . EP IMPLANTABLE DEVICE N/A 05/21/2016   Procedure:  ICD Generator Changeout;  Surgeon: Evans Lance, MD;  Location: [facility];  Service: Cardiovascular;  Laterality: N/A;  . LEFT HEART CATHETERIZATION WITH CORONARY  ANGIOGRAM N/A 09/06/2014   Procedure: LEFT HEART CATHETERIZATION WITH CORONARY ANGIOGRAM;  Surgeon: Burnell Blanks, MD;  Location: [facility] CATH LAB;  Service: Cardiovascular;  Laterality: N/A;  . PTX     traumatic, age 77 months  . THYROIDECTOMY  12/10/2011   Procedure: TOTAL THYROIDECTOMY WITH CENTRAL COMPARTMENT DISSECTION;  Surgeon: Earnstine Regal, MD;  Location: [facility];  Service: General;  Laterality: N/A;  Total thyroidectomy with limited central compartment lymph node dissection.  Marland Kitchen VASECTOMY      Social History   Socioeconomic History  . Marital status: Married    Spouse name: Not on file  . Number of children: 2  . Years of education: Not on file  . Highest education level: Not on file  Occupational History  . Occupation: Warehouse work    Fish farm manager: [employer]  Social Needs  . Financial resource strain: Not on file  . Food insecurity:    Worry: Not on file    Inability: Not on file  . Transportation needs:    Medical: Not on file    Non-medical: Not on file  Tobacco Use  . Smoking status: Former Smoker    Packs/day: 2.50    Years: 2.00    Pack years: 5.00    Types: Cigarettes    Last attempt to quit: 11/29/1970    Years since quitting: 48.1  . Smokeless tobacco: Former Systems developer  Types: Chew  Substance and Sexual Activity  . Alcohol use: Yes    Comment: 09/05/2014 "2 drinks q couple months, if that  . Drug use: No  . Sexual activity: Yes  Lifestyle  . Physical activity:    Days per week: Not on file    Minutes per session: Not on file  . Stress: Not on file  Relationships  . Social connections:    Talks on phone: Not on file    Gets together: Not on file    Attends religious service: Not on file    Active member of club or organization: Not on file    Attends meetings of clubs or organizations: Not on file    Relationship status: Not on file  . Intimate partner violence:    Fear of current or ex partner: Not on file    Emotionally abused: Not on file    Physically  abused: Not on file    Forced sexual activity: Not on file  Other Topics Concern  . Not on file  Social History Narrative   Live w/ wife , daughter [name] lives in [place]   8 dogs, fish pond.   Job is very physical.   Does not routinely exercise.     Family History  Problem Relation Age of Onset  . Atrial fibrillation Father        diseased at ag 6  . Hypertension Father        alive @ 25.  Marland Kitchen Hypothyroidism Mother   . Hypertension Mother        alive @ 35.  . Diabetes Maternal Grandfather   . Diabetes Maternal Aunt   . Colon cancer Maternal Aunt   . Colon cancer Maternal Aunt   . Lung cancer Paternal Uncle   . Lung cancer Maternal Uncle   . Asthma Sister   . Hypothyroidism Daughter   . Prostate cancer Neg Hx   . CAD Neg Hx      Allergies as of 12/25/2018      Reactions   Losartan Other (See Comments)   Leg cramps   Ramipril Cough      Medication List       Accurate as of December 25, 2018 11:59 PM. Always use your most recent med list.        acetaminophen 500 MG tablet Commonly known as:  TYLENOL Take 1,000 mg by mouth every 6 (six) hours as needed (pain).   allopurinol 100 MG tablet Commonly known as:  ZYLOPRIM TAKE 1 TABLET BY MOUTH EVERY DAY   aspirin EC 81 MG tablet Take 1 tablet (81 mg total) by mouth daily.   atorvastatin 40 MG tablet Commonly known as:  LIPITOR Take 1 tablet (40 mg total) by mouth daily.   carvedilol 12.5 MG tablet Commonly known as:  COREG Take 1 tablet (12.5 mg total) by mouth daily.   dapagliflozin propanediol 5 MG Tabs tablet Commonly known as:  FARXIGA Take 5 mg by mouth daily.   furosemide 20 MG tablet Commonly known as:  LASIX Take 1 tablet (20 mg total) by mouth daily.   gabapentin 100 MG capsule Commonly known as:  NEURONTIN Take 2-3 capsules (200-300 mg total) by mouth at bedtime.   KLOR-CON M10 10 MEQ tablet Generic drug:  potassium chloride TAKE 1 TABLET BY MOUTH EVERY DAY   levothyroxine 112 MCG  tablet Commonly known as:  SYNTHROID, LEVOTHROID Take 224 mcg by mouth daily before breakfast.   MAGNESIUM PO Take  1 tablet by mouth daily.   metFORMIN 1000 MG tablet Commonly known as:  GLUCOPHAGE Take 1 tablet (1,000 mg total) by mouth 2 (two) times daily with a meal.   multivitamin with minerals Tabs tablet Take 1 tablet by mouth daily.   tadalafil 20 MG tablet Commonly known as:  ADCIRCA/CIALIS Take 1 tablet (20 mg total) by mouth daily as needed for erectile dysfunction.   TRULICITY 1.74 BS/4.9QP Sopn Generic drug:  Dulaglutide Inject 0.75 mg into the skin once a week.   vitamin E 400 UNIT capsule Take 400 Units by mouth daily.           Objective:   Physical Exam BP 136/70 (BP Location: Left Arm, Patient Position: Sitting, Cuff Size: Normal)   Pulse 65   Temp 98.2 F (36.8 C) (Oral)   Resp 16   Ht 6\' 1"  (1.854 m)   Wt 259 lb 4 oz (117.6 kg)   SpO2 93%   BMI 34.20 kg/m      General: Well developed, NAD, BMI noted  HEENT:  Normocephalic . Face symmetric, atraumatic Lungs:  CTA B Normal respiratory effort, no intercostal retractions, no accessory muscle use. Heart: RRR,  no murmur.  No pretibial edema bilaterally  Abdomen:  Not distended, soft, non-tender. No rebound or rigidity.   Skin: Exposed areas without rash. Not pale. Not jaundice MSK: Hip rotation bilaterally normal, no TTP at the trochanteric bursas Neurologic:  alert & oriented X3.  Speech normal, gait appropriate for age and unassisted Strength symmetric and appropriate for age.  Psych: Cognition and judgment appear intact.  Cooperative with normal attention span and concentration.  Behavior appropriate. No anxious or depressed appearing.   Assessment     Assessment DM,  actos intolerant 01-2018 + Mild neuropathy on exam 03/2017, HTN Hyperlipidemia Hypothyroidism h/oThyroid cancer, nodularity, no mets, thyroidectomy 2013- sees Dr Buddy Duty yearly as off 02-2018 CV: --CHF/nonischemic  cardiomyopathy dx 2011 --Ventricular tachycardia, 2011, normal coronaries, EF  ~15 -20 % -- s/p  biventricular ICD 2011--- replaced 04-2016 --CAD -Nonobstructive   by cath 08-2014 --LBBB  GI: --GERD --H/o anemia:  --Gastric ulcer per EGD 07-2010 --Colonoscopy 2011:4 polyps  DJD (hip pain, XRs dosne 2017) H/o gout  PLAN  DM, thyroid disease: Per endocrinology HTN, CHF: Since the last visit, I communicated with cardiology regards dizziness, they review the ICD data from that day and everything looked normal. Continue carvedilol, Lasix, potassium, magnesium. DJD: Current symptoms include hip pain, recommend stretching (look for information at the [facility] website) or call for a referral.  He is taking Aleve, recommend Tylenol instead, sporadic Aleve is still okay.  Ice.  Call if not better RTC 1 year

## 2018-12-25 NOTE — Assessment & Plan Note (Addendum)
Td 2015; Pneumovax 2011-2018 ; Prevnar 2015; zostavax 2016; shingrix #1 10/2019;  had a flu shot   CCS:  Colonoscopy 9- 2011 4 polyps, due for a colonoscopy, pt aware, states he will call GI today Prostate cancer screening: last DRE 2017,   PSA. 11/2017 wnl , no sxs ,reassess 2021 Diet and exercise discussed Labs: CMP, FLP, CBC, magnesium

## 2018-12-25 NOTE — Progress Notes (Signed)
Pre visit review using our clinic review tool, if applicable. No additional management support is needed unless otherwise documented below in the visit note. 

## 2018-12-26 ENCOUNTER — Telehealth: Payer: Self-pay

## 2018-12-26 ENCOUNTER — Ambulatory Visit (INDEPENDENT_AMBULATORY_CARE_PROVIDER_SITE_OTHER): Payer: 59

## 2018-12-26 DIAGNOSIS — Z95810 Presence of automatic (implantable) cardiac defibrillator: Secondary | ICD-10-CM | POA: Diagnosis not present

## 2018-12-26 DIAGNOSIS — I5022 Chronic systolic (congestive) heart failure: Secondary | ICD-10-CM | POA: Diagnosis not present

## 2018-12-26 LAB — CUP PACEART REMOTE DEVICE CHECK
Battery Remaining Longevity: 70 mo
Battery Voltage: 2.98 V
Brady Statistic AP VP Percent: 0.74 %
Brady Statistic AP VS Percent: 0.03 %
Brady Statistic AS VP Percent: 97.71 %
Brady Statistic AS VS Percent: 1.52 %
Brady Statistic RV Percent Paced: 79.86 %
Date Time Interrogation Session: 20200127083325
HighPow Impedance: 43 Ohm
HighPow Impedance: 52 Ohm
Implantable Lead Implant Date: 20110516
Implantable Lead Implant Date: 20110516
Implantable Lead Location: 753858
Implantable Lead Location: 753859
Implantable Lead Location: 753860
Implantable Lead Model: 5076
Lead Channel Impedance Value: 266 Ohm
Lead Channel Impedance Value: 342 Ohm
Lead Channel Impedance Value: 380 Ohm
Lead Channel Impedance Value: 380 Ohm
Lead Channel Impedance Value: 551 Ohm
Lead Channel Impedance Value: 817 Ohm
Lead Channel Pacing Threshold Amplitude: 0.875 V
Lead Channel Pacing Threshold Amplitude: 1 V
Lead Channel Pacing Threshold Amplitude: 1.125 V
Lead Channel Pacing Threshold Pulse Width: 0.4 ms
Lead Channel Pacing Threshold Pulse Width: 0.4 ms
Lead Channel Sensing Intrinsic Amplitude: 1.25 mV
Lead Channel Sensing Intrinsic Amplitude: 1.25 mV
Lead Channel Sensing Intrinsic Amplitude: 8.25 mV
Lead Channel Sensing Intrinsic Amplitude: 8.25 mV
Lead Channel Setting Pacing Amplitude: 2 V
Lead Channel Setting Pacing Amplitude: 2.25 V
Lead Channel Setting Pacing Amplitude: 2.5 V
Lead Channel Setting Pacing Pulse Width: 0.4 ms
Lead Channel Setting Sensing Sensitivity: 0.3 mV
MDC IDC LEAD IMPLANT DT: 20110516
MDC IDC MSMT LEADCHNL LV PACING THRESHOLD PULSEWIDTH: 0.4 ms
MDC IDC PG IMPLANT DT: 20170623
MDC IDC SET LEADCHNL LV PACING PULSEWIDTH: 0.4 ms
MDC IDC STAT BRADY RA PERCENT PACED: 0.77 %

## 2018-12-26 NOTE — Telephone Encounter (Signed)
Remote ICM transmission received.  Attempted call to patient regarding ICM remote transmission and left message, per DPR, to return call.    

## 2018-12-26 NOTE — Assessment & Plan Note (Signed)
DM, thyroid disease: Per endocrinology HTN, CHF: Since the last visit, I communicated with cardiology regards dizziness, they review the ICD data from that day and everything looked normal. Continue carvedilol, Lasix, potassium, magnesium. DJD: Current symptoms include hip pain, recommend stretching (look for information at the [facility] website) or call for a referral.  He is taking Aleve, recommend Tylenol instead, sporadic Aleve is still okay.  Ice.  Call if not better RTC 1 year

## 2018-12-26 NOTE — Progress Notes (Addendum)
EPIC Encounter for ICM Monitoring  Patient Name: Mike Jimenez is a 68 y.o. male Date: 12/26/2018 Primary Care Physican: Colon Branch, MD Primary Cardiologist:Taylor Electrophysiologist: Santina Evans Pacing: 98.2% Last [id number] 265lbs  Attempted call to patient.  Left message to return call. Transmission reviewed.   Thoracic impedance normal.   Prescribed:Furosemide 20 mg 1tablet (20 mg total) daily. Potassium 10 mEq 1tablet (10 mEq total) daily  Labs: 12/25/2018 Creatinine 1.17, BUN 17, Potassium 4.2, Sodium 141, eGFR 62.20 10/23/2018 Creatinine 1.12, BUN 21, Potassium 4.1, Sodium 142, eGFR 69.56 05/22/2018 Creatinine 1.14, BUN 14, Potassium 4.2, Sodium 141, EGFR 68.24 03/23/2018 Creatinine1.17, BUN20, Potassium4.1, Sodium141, IRJJ88.41  Recommendations: Unable to reach.  Follow-up plan: ICM clinic phone appointment on2/01/2019 to recheck fluid levels  Copy of ICM check sent to Dr.Taylor.  3 month ICM trend: 12/25/2018    1 Year ICM trend:       Rosalene Billings, RN 12/26/2018 10:01 AM

## 2018-12-26 NOTE — Progress Notes (Signed)
Remote ICD transmission.   

## 2018-12-29 NOTE — Progress Notes (Addendum)
Subjective:   Mike Jimenez is a 68 y.o. male who presents for an Initial Medicare Annual Wellness Visit.  The Patient was informed that the wellness visit is to identify future health risk and educate and initiate measures that can reduce risk for increased disease through the lifespan.   Describes health as fair, good or great? "pretty good"  Works at [employer] 40 hrs/wk. Did not enjoy being retired.  Review of Systems No ROS.  Medicare Wellness Visit. Additional risk factors are reflected in the social history. Cardiac Risk Factors include: advanced age (>61men, >76 women);diabetes mellitus;dyslipidemia;hypertension;male gender;obesity (BMI >30kg/m2) Sleep patterns: wakes 2x to urinate. Sleeps 6-7 hrs. Feels rested.  Home Safety/Smoke Alarms: Feels safe in home. Smoke alarms in place.  Lives in split level home with wife and 8 dogs.  Eye- pt reports done march 2019. Eye Doctor at [facility].   Male:   CCS- pt past due 2016. Pt states he will schedule.     PSA-  Lab Results  Component Value Date   PSA 0.84 12/19/2017   PSA 1.11 01/29/2014   PSA 0.99 01/25/2007      Objective:    Today's Vitals   01/01/19 0925  BP: 140/78  Pulse: 76  SpO2: 96%  Weight: 260 lb 9.6 oz (118.2 kg)  Height: 6\' 1"  (1.854 m)   Body mass index is 34.38 kg/m.  Advanced Directives 01/01/2019 05/21/2016 03/08/2016 09/07/2014 09/05/2014 09/05/2014 09/05/2014  Does Patient Have a Medical Advance Directive? No No No No No No No  Would patient like information on creating a medical advance directive? Yes (MAU/Ambulatory/Procedural Areas - Information given) No - patient declined information No - patient declined information No - patient declined information No - patient declined information No - patient declined information No - patient declined information    Current Medications (verified) Outpatient Encounter Medications as of 01/01/2019  Medication Sig  . acetaminophen (TYLENOL) 500 MG tablet  Take 1,000 mg by mouth every 6 (six) hours as needed (pain).  Marland Kitchen allopurinol (ZYLOPRIM) 100 MG tablet TAKE 1 TABLET BY MOUTH EVERY DAY  . aspirin EC 81 MG tablet Take 1 tablet (81 mg total) by mouth daily.  Marland Kitchen atorvastatin (LIPITOR) 40 MG tablet Take 1 tablet (40 mg total) by mouth daily.  . carvedilol (COREG) 12.5 MG tablet Take 1 tablet (12.5 mg total) by mouth daily.  . dapagliflozin propanediol (FARXIGA) 5 MG TABS tablet Take 5 mg by mouth daily.  . Dulaglutide (TRULICITY) 2.69 SW/5.4OE SOPN Inject 0.75 mg into the skin once a week.  . furosemide (LASIX) 20 MG tablet Take 1 tablet (20 mg total) by mouth daily.  Marland Kitchen gabapentin (NEURONTIN) 100 MG capsule Take 2-3 capsules (200-300 mg total) by mouth at bedtime.  Marland Kitchen KLOR-CON M10 10 MEQ tablet TAKE 1 TABLET BY MOUTH EVERY DAY  . levothyroxine (SYNTHROID, LEVOTHROID) 112 MCG tablet Take 224 mcg by mouth daily before breakfast.  . metFORMIN (GLUCOPHAGE) 1000 MG tablet Take 1 tablet (1,000 mg total) by mouth 2 (two) times daily with a meal.  . Multiple Vitamin (MULTIVITAMIN WITH MINERALS) TABS tablet Take 1 tablet by mouth daily.  . tadalafil (CIALIS) 20 MG tablet Take 1 tablet (20 mg total) by mouth daily as needed for erectile dysfunction.  . vitamin E 400 UNIT capsule Take 400 Units by mouth daily.  Marland Kitchen MAGNESIUM PO Take 1 tablet by mouth daily.   No facility-administered encounter medications on file as of 01/01/2019.     Allergies (  verified) Losartan and Ramipril   History: Past Medical History:  Diagnosis Date  . Anemia   . Arthritis    "knees" (09/05/2014)  . Automatic implantable cardioverter-defibrillator in situ   . CAD (coronary artery disease)    a. Mild nonobstructive CAD by cath 08/2014.  Marland Kitchen CHF (congestive heart failure) ([place])   . Childhood asthma   . Gastric ulcer 2011  . GERD (gastroesophageal reflux disease)   . History of gout   . Hyperlipidemia   . Hypertension   . LBBB (left bundle branch block)   . NICM (nonischemic  cardiomyopathy) ([place])    a. Dx 2011 - presented with sustained VT. Normal coronaries at that time, EF 10-15% by cath and 20-25% by echo. b. s/p biventricular ICD 2011. c. CP 08/2014: mild nonobstructive CAD.  Marland Kitchen Obesity   . Postsurgical hypothyroidism   . Thyroid cancer ([place])    a. 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 Dr Harlow Asa. s/p thyroidectomy 2013.  . Torn meniscus   . Type II diabetes mellitus ([place])   . Ventricular tachycardia ([place])    a. Sustained VT 2011 s/p MDT [id number] Concert BiV ICD, ser# [id number] H.   Past Surgical History:  Procedure Laterality Date  . BI-VENTRICULAR IMPLANTABLE CARDIOVERTER DEFIBRILLATOR  (CRT-D)  03/2010  . CARDIAC CATHETERIZATION  03/2010  . EP IMPLANTABLE DEVICE N/A 05/21/2016   Procedure:  ICD Generator Changeout;  Surgeon: Evans Lance, MD;  Location: [facility];  Service: Cardiovascular;  Laterality: N/A;  . LEFT HEART CATHETERIZATION WITH CORONARY ANGIOGRAM N/A 09/06/2014   Procedure: LEFT HEART CATHETERIZATION WITH CORONARY ANGIOGRAM;  Surgeon: Burnell Blanks, MD;  Location: [facility] CATH LAB;  Service: Cardiovascular;  Laterality: N/A;  . PTX     traumatic, age 59 months  . THYROIDECTOMY  12/10/2011   Procedure: TOTAL THYROIDECTOMY WITH CENTRAL COMPARTMENT DISSECTION;  Surgeon: Earnstine Regal, MD;  Location: [facility];  Service: General;  Laterality: N/A;  Total thyroidectomy with limited central compartment lymph node dissection.  Marland Kitchen VASECTOMY     Family History  Problem Relation Age of Onset  . Atrial fibrillation Father        diseased at ag 65  . Hypertension Father        alive @ 24.  Marland Kitchen Hypothyroidism Mother   . Hypertension Mother        alive @ 41.  . Diabetes Maternal Grandfather   . Diabetes Maternal Aunt   . Colon cancer Maternal Aunt   . Colon cancer Maternal Aunt   . Lung cancer Paternal Uncle   . Lung cancer Maternal Uncle   . Asthma Sister   . Hypothyroidism Daughter   . Prostate cancer Neg Hx   . CAD Neg  Hx    Social History   Socioeconomic History  . Marital status: Married    Spouse name: Not on file  . Number of children: 2  . Years of education: Not on file  . Highest education level: Not on file  Occupational History  . Occupation: Warehouse work    Fish farm manager: [employer]  Social Needs  . Financial resource strain: Not on file  . Food insecurity:    Worry: Not on file    Inability: Not on file  . Transportation needs:    Medical: Not on file    Non-medical: Not on file  Tobacco Use  . Smoking status: Former Smoker    Packs/day: 2.50    Years: 2.00    Pack years:  5.00    Types: Cigarettes    Last attempt to quit: 11/29/1970    Years since quitting: 48.1  . Smokeless tobacco: Former Systems developer    Types: Chew  Substance and Sexual Activity  . Alcohol use: Yes    Comment: 09/05/2014 "2 drinks q couple months, if that  . Drug use: No  . Sexual activity: Yes  Lifestyle  . Physical activity:    Days per week: Not on file    Minutes per session: Not on file  . Stress: Not on file  Relationships  . Social connections:    Talks on phone: Not on file    Gets together: Not on file    Attends religious service: Not on file    Active member of club or organization: Not on file    Attends meetings of clubs or organizations: Not on file    Relationship status: Not on file  Other Topics Concern  . Not on file  Social History Narrative   Live w/ wife , daughter [name] lives in [place]   8 dogs, fish pond.   Job is very physical.   Does not routinely exercise.   Tobacco Counseling Counseling given: Not Answered   Clinical Intake: Pain : No/denies pain  Activities of Daily Living In your present state of health, do you have any difficulty performing the following activities: 01/01/2019 12/25/2018  Hearing? N N  Vision? N N  Difficulty concentrating or making decisions? N N  Walking or climbing stairs? N N  Dressing or bathing? N N  Doing errands, shopping? N N  Preparing Food and eating ?  N -  Using the Toilet? N -  In the past six months, have you accidently leaked urine? N -  Do you have problems with loss of bowel control? N -  Managing your Medications? N -  Managing your Finances? N -  Housekeeping or managing your Housekeeping? N -  Some recent data might be hidden     Immunizations and Health Maintenance Immunization History  Administered Date(s) Administered  . Influenza Split 09/08/2011, 07/20/2018  . Influenza Whole 08/17/2010  . Influenza, High Dose Seasonal PF 07/11/2017  . Influenza,inj,Quad PF,6+ Mos 08/29/2013  . Influenza-Unspecified 07/30/2014, 07/31/2015, 07/30/2016  . PPD Test 09/17/2011  . Pneumococcal Conjugate-13 11/20/2014  . Pneumococcal Polysaccharide-23 04/23/2010, 04/11/2017  . Tdap 01/23/2014  . Zoster 11/28/2015  . Zoster Recombinat (Shingrix) 11/20/2018   Health Maintenance Due  Topic Date Due  . COLONOSCOPY  08/13/2015  . OPHTHALMOLOGY EXAM  09/26/2018    Patient Care Team: Colon Branch, MD as PCP - General (Internal Medicine) Delrae Rend, MD as Consulting Physician (Endocrinology) Thalia Bloodgood, [place] as Referring Physician (Optometry)  Indicate any recent Medical Services you may have received from other than [facility] providers in the past year (date may be approximate).    Assessment:   This is a routine wellness examination for Eliasar. Physical assessment deferred to PCP.  Hearing/Vision screen  Hearing Screening   125Hz  250Hz  500Hz  1000Hz  2000Hz  3000Hz  4000Hz  6000Hz  8000Hz   Right ear:   Pass Pass Pass  Pass    Left ear:   Fail Pass Pass  Fail    Comments: Pt declines audiology referral.    Visual Acuity Screening   Right eye Left eye Both eyes  Without correction: 20/25 20/25 20/25   With correction:       Dietary issues and exercise activities discussed: Current Exercise Habits: The patient has a physically strenuous job, but  has no regular exercise apart from work., Exercise limited by: None identified Diet (meal  preparation, eat out, water intake, caffeinated beverages, dairy products, fruits and vegetables): in general, a "healthy" diet  , well balanced, on average, 3 meals per day   Goals    . continue to eat healthy and stay active with job      Depression Screen PHQ 2/9 Scores 01/01/2019 12/25/2018 12/19/2017 11/05/2016  PHQ - 2 Score 0 0 0 0  PHQ- 9 Score 0 2 - -    Fall Risk Fall Risk  01/01/2019 12/25/2018 12/19/2017 11/05/2016 11/28/2015  Falls in the past year? 0 0 No No No    Cognitive Function: MMSE - Mini Mental State Exam 01/01/2019  Orientation to time 5  Orientation to Place 5  Registration 3  Attention/ Calculation 5  Recall 3  Language- name 2 objects 2  Language- repeat 1  Language- follow 3 step command 3  Language- read & follow direction 1  Write a sentence 1  Copy design 1  Total score 30        Screening Tests Health Maintenance  Topic Date Due  . COLONOSCOPY  08/13/2015  . OPHTHALMOLOGY EXAM  09/26/2018  . HEMOGLOBIN A1C  04/04/2019  . FOOT EXAM  10/05/2019  . URINE MICROALBUMIN  10/05/2019  . TETANUS/TDAP  01/24/2024  . INFLUENZA VACCINE  Completed  . Hepatitis C Screening  Completed  . PNA vac Low Risk Adult  Completed      Plan:     Please schedule your next medicare wellness visit with me in 1 yr.  Continue to eat heart healthy diet (full of fruits, vegetables, whole grains, lean protein, water--limit salt, fat, and sugar intake).  Continue to stay physically active  Continue doing brain stimulating activities to keep memory sharp.   Bring a copy of your living will and/or healthcare power of attorney to your next office visit.  Schedule colonoscopy and eye exam  I have personally reviewed and noted the following in the patient's chart:   . Medical and social history . Use of alcohol, tobacco or illicit drugs  . Current medications and supplements . Functional ability and status . Nutritional status . Physical activity . Advanced  directives . List of other physicians . Hospitalizations, surgeries, and ER visits in previous 12 months . Vitals . Screenings to include cognitive, depression, and falls . Referrals and appointments  In addition, I have reviewed and discussed with patient certain preventive protocols, quality metrics, and best practice recommendations. A written personalized care plan for preventive services as well as general preventive health recommendations were provided to patient.     Naaman Plummer [place], [place]   01/01/2019   Kathlene November, MD

## 2019-01-01 ENCOUNTER — Encounter: Payer: Self-pay | Admitting: *Deleted

## 2019-01-01 ENCOUNTER — Ambulatory Visit (INDEPENDENT_AMBULATORY_CARE_PROVIDER_SITE_OTHER): Payer: 59 | Admitting: *Deleted

## 2019-01-01 ENCOUNTER — Ambulatory Visit (INDEPENDENT_AMBULATORY_CARE_PROVIDER_SITE_OTHER): Payer: 59

## 2019-01-01 VITALS — BP 140/78 | HR 76 | Ht 73.0 in | Wt 260.6 lb

## 2019-01-01 DIAGNOSIS — I5022 Chronic systolic (congestive) heart failure: Secondary | ICD-10-CM

## 2019-01-01 DIAGNOSIS — Z0000 Encounter for general adult medical examination without abnormal findings: Secondary | ICD-10-CM | POA: Diagnosis not present

## 2019-01-01 DIAGNOSIS — Z95810 Presence of automatic (implantable) cardiac defibrillator: Secondary | ICD-10-CM

## 2019-01-01 NOTE — Patient Instructions (Signed)
Please schedule your next medicare wellness visit with me in 1 yr.  Continue to eat heart healthy diet (full of fruits, vegetables, whole grains, lean protein, water--limit salt, fat, and sugar intake).  Continue to stay physically active  Continue doing brain stimulating activities to keep memory sharp.   Bring a copy of your living will and/or healthcare power of attorney to your next office visit.  Schedule colonoscopy and eye exam   [name] , Thank you for taking time to come for your Medicare Wellness Visit. I appreciate your ongoing commitment to your health goals. Please review the following plan we discussed and let me know if I can assist you in the future.   These are the goals we discussed: Goals    . continue to eat healthy and stay active with job       This is a list of the screening recommended for you and due dates:  Health Maintenance  Topic Date Due  . Colon Cancer Screening  08/13/2015  . Eye exam for diabetics  09/26/2018  . Hemoglobin A1C  04/04/2019  . Complete foot exam   10/05/2019  . Urine Protein Check  10/05/2019  . Tetanus Vaccine  01/24/2024  . Flu Shot  Completed  .  Hepatitis C: One time screening is recommended by Center for Disease Control  (CDC) for  adults born from [birth year] through 1964.   Completed  . Pneumonia vaccines  Completed    Health Maintenance After Age 43 After age 88, you are at a higher risk for certain long-term diseases and infections as well as injuries from falls. Falls are a major cause of broken bones and head injuries in people who are older than age 66. Getting regular preventive care can help to keep you healthy and well. Preventive care includes getting regular testing and making lifestyle changes as recommended by your health care provider. Talk with your health care provider about:  Which screenings and tests you should have. A screening is a test that checks for a disease when you have no symptoms.  A diet and  exercise plan that is right for you. What should I know about screenings and tests to prevent falls? Screening and testing are the best ways to find a health problem early. Early diagnosis and treatment give you the best chance of managing medical conditions that are common after age 62. Certain conditions and lifestyle choices may make you more likely to have a fall. Your health care provider may recommend:  Regular vision checks. Poor vision and conditions such as cataracts can make you more likely to have a fall. If you wear glasses, make sure to get your prescription updated if your vision changes.  Medicine review. Work with your health care provider to regularly review all of the medicines you are taking, including over-the-counter medicines. Ask your health care provider about any side effects that may make you more likely to have a fall. Tell your health care provider if any medicines that you take make you feel dizzy or sleepy.  Osteoporosis screening. Osteoporosis is a condition that causes the bones to get weaker. This can make the bones weak and cause them to break more easily.  Blood pressure screening. Blood pressure changes and medicines to control blood pressure can make you feel dizzy.  Strength and balance checks. Your health care provider may recommend certain tests to check your strength and balance while standing, walking, or changing positions.  Foot health exam. Foot pain  and numbness, as well as not wearing proper footwear, can make you more likely to have a fall.  Depression screening. You may be more likely to have a fall if you have a fear of falling, feel emotionally low, or feel unable to do activities that you used to do.  Alcohol use screening. Using too much alcohol can affect your balance and may make you more likely to have a fall. What actions can I take to lower my risk of falls? General instructions  Talk with your health care provider about your risks for  falling. Tell your health care provider if: ? You fall. Be sure to tell your health care provider about all falls, even ones that seem minor. ? You feel dizzy, sleepy, or off-balance.  Take over-the-counter and prescription medicines only as told by your health care provider. These include any supplements.  Eat a healthy diet and maintain a healthy weight. A healthy diet includes low-fat dairy products, low-fat (lean) meats, and fiber from whole grains, beans, and lots of fruits and vegetables. Home safety  Remove any tripping hazards, such as rugs, cords, and clutter.  Install safety equipment such as grab bars in bathrooms and safety rails on stairs.  Keep rooms and walkways well-lit. Activity   Follow a regular exercise program to stay fit. This will help you maintain your balance. Ask your health care provider what types of exercise are appropriate for you.  If you need a cane or walker, use it as recommended by your health care provider.  Wear supportive shoes that have nonskid soles. Lifestyle  Do not drink alcohol if your health care provider tells you not to drink.  If you drink alcohol, limit how much you have: ? 0-1 drink a day for women. ? 0-2 drinks a day for men.  Be aware of how much alcohol is in your drink. In the [place] one drink equals one typical bottle of beer (12 oz), one-half glass of wine (5 oz), or one shot of hard liquor (1 oz).  Do not use any products that contain nicotine or tobacco, such as cigarettes and e-cigarettes. If you need help quitting, ask your health care provider. Summary  Having a healthy lifestyle and getting preventive care can help to protect your health and wellness after age 20.  Screening and testing are the best way to find a health problem early and help you avoid having a fall. Early diagnosis and treatment give you the best chance for managing medical conditions that are more common for people who are older than age 67.  Falls  are a major cause of broken bones and head injuries in people who are older than age 46. Take precautions to prevent a fall at home.  Work with your health care provider to learn what changes you can make to improve your health and wellness and to prevent falls. This information is not intended to replace advice given to you by your health care provider. Make sure you discuss any questions you have with your health care provider. Document Released: 09/28/2017 Document Revised: 09/28/2017 Document Reviewed: 09/28/2017 Elsevier Interactive Patient Education  2019 [employer].

## 2019-01-02 NOTE — Progress Notes (Signed)
EPIC Encounter for ICM Monitoring  Patient Name: Mike Jimenez is a 68 y.o. male Date: 01/02/2019 Primary Care Physican: Colon Branch, MD Primary Cardiologist:Taylor Electrophysiologist: Santina Evans Pacing: 98.3% Last [id number]- 265lbs Today's Weight: 258 lbs  Heart failure questions reviewed. He is asymptomatic.    Thoracic impedance just below baseline normal.   Prescribed:Furosemide 20 mg 1tablet (20 mg total) daily. Potassium 10 mEq 1tablet (10 mEq total) daily  Labs: 12/25/2018 Creatinine 1.17, BUN 17, Potassium 4.2, Sodium 141, eGFR 62.20 10/23/2018 Creatinine 1.12, BUN 21, Potassium 4.1, Sodium 142, eGFR 69.56 05/22/2018 Creatinine 1.14, BUN 14, Potassium 4.2, Sodium 141, EGFR 68.24 03/23/2018 Creatinine1.17, BUN20, Potassium4.1, Sodium141, FOAD52.58  Recommendations:Advised to limit dietary salt intake.   Follow-up plan: ICM clinic phone appointment on3/12/2018.  Copy of ICM check sent to Dr.Taylor.  3 month ICM trend: 01/01/2019    1 Year ICM trend:       Rosalene Billings, RN 01/02/2019 12:20 PM

## 2019-01-06 ENCOUNTER — Other Ambulatory Visit: Payer: Self-pay | Admitting: Internal Medicine

## 2019-01-29 ENCOUNTER — Ambulatory Visit (INDEPENDENT_AMBULATORY_CARE_PROVIDER_SITE_OTHER): Payer: 59

## 2019-01-29 DIAGNOSIS — Z95810 Presence of automatic (implantable) cardiac defibrillator: Secondary | ICD-10-CM | POA: Diagnosis not present

## 2019-01-29 DIAGNOSIS — I5022 Chronic systolic (congestive) heart failure: Secondary | ICD-10-CM

## 2019-01-29 NOTE — Progress Notes (Signed)
EPIC Encounter for ICM Monitoring  Patient Name: Mike Jimenez is a 68 y.o. male Date: 01/29/2019 Primary Care Physican: Colon Branch, MD Primary Cardiologist:Taylor Electrophysiologist: Santina Evans Pacing: 98% Last [id number]- 265lbs Today's Weight: unknown  Attempted call to patient and unable to reach.  Left detailed message per DPR regarding transmission. Transmission reviewed.   Thoracic impedance baseline normal.   Prescribed:Furosemide 20 mg 1tablet (20 mg total) daily. Potassium 10 mEq 1tablet (10 mEq total) daily  Labs: 12/25/2018 Creatinine 1.17, BUN 17, Potassium 4.2, Sodium 141, eGFR 62.20 10/23/2018 Creatinine 1.12, BUN 21, Potassium 4.1, Sodium 142, eGFR 69.56 05/22/2018 Creatinine 1.14, BUN 14, Potassium 4.2, Sodium 141, EGFR 68.24 03/23/2018 Creatinine1.17, BUN20, Potassium4.1, Sodium141, [id number]  Recommendations: Left voice mail with ICM number and encouraged to call if experiencing any fluid symptoms.  Follow-up plan: ICM clinic phone appointment on4/04/2019.  Copy of ICM check sent to Dr.Taylor.  3 month ICM trend: 01/29/2019    1 Year ICM trend:       Rosalene Billings, RN 01/29/2019 2:55 PM

## 2019-02-09 LAB — HM DIABETES EYE EXAM

## 2019-02-13 ENCOUNTER — Encounter: Payer: Self-pay | Admitting: Internal Medicine

## 2019-02-28 ENCOUNTER — Other Ambulatory Visit: Payer: Self-pay | Admitting: Internal Medicine

## 2019-03-05 ENCOUNTER — Other Ambulatory Visit: Payer: Self-pay

## 2019-03-05 ENCOUNTER — Ambulatory Visit (INDEPENDENT_AMBULATORY_CARE_PROVIDER_SITE_OTHER): Payer: 59

## 2019-03-05 DIAGNOSIS — I5022 Chronic systolic (congestive) heart failure: Secondary | ICD-10-CM | POA: Diagnosis not present

## 2019-03-05 DIAGNOSIS — Z95810 Presence of automatic (implantable) cardiac defibrillator: Secondary | ICD-10-CM | POA: Diagnosis not present

## 2019-03-05 NOTE — Progress Notes (Signed)
EPIC Encounter for ICM Monitoring  Patient Name: Mike Jimenez is a 68 y.o. male Date: 03/05/2019 Primary Care Physican: Colon Branch, MD Primary Cardiologist:Taylor Electrophysiologist: Santina Evans Pacing: 98% Last [id number] 265lbs 03/05/2019 Weight: 260 lbs  Spoke with patient and is asymptomatic.   Thoracic impedancebaselinenormal.   Prescribed:Furosemide 20 mg 1tablet (20 mg total) daily. Potassium 10 mEq 1tablet (10 mEq total) daily  Labs: 12/25/2018 Creatinine 1.17, BUN 17, Potassium 4.2, Sodium 141, eGFR 62.20 10/23/2018 Creatinine 1.12, BUN 21, Potassium 4.1, Sodium 142, eGFR 69.56 05/22/2018 Creatinine 1.14, BUN 14, Potassium 4.2, Sodium 141, EGFR 68.24 03/23/2018 Creatinine1.17, BUN20, Potassium4.1, Sodium141, [id number]  Recommendations: Encouraged to call if experiencing any fluid symptoms.  Follow-up plan: ICM clinic phone appointment on5/09/2019.  Copy of ICM check sent to Dr.Taylor.   3 month ICM trend: 03/05/2019    1 Year ICM trend:       Rosalene Billings, RN 03/05/2019 11:26 AM

## 2019-03-19 ENCOUNTER — Encounter: Payer: 59 | Admitting: Internal Medicine

## 2019-03-26 ENCOUNTER — Ambulatory Visit (INDEPENDENT_AMBULATORY_CARE_PROVIDER_SITE_OTHER): Payer: 59 | Admitting: *Deleted

## 2019-03-26 ENCOUNTER — Other Ambulatory Visit: Payer: Self-pay

## 2019-03-26 DIAGNOSIS — I429 Cardiomyopathy, unspecified: Secondary | ICD-10-CM

## 2019-03-26 DIAGNOSIS — I5022 Chronic systolic (congestive) heart failure: Secondary | ICD-10-CM | POA: Diagnosis not present

## 2019-03-26 LAB — CUP PACEART REMOTE DEVICE CHECK
Battery Remaining Longevity: 63 mo
Battery Voltage: 2.97 V
Brady Statistic AP VP Percent: 0.33 %
Brady Statistic AP VS Percent: 0.02 %
Brady Statistic AS VP Percent: 98.16 %
Brady Statistic AS VS Percent: 1.5 %
Brady Statistic RA Percent Paced: 0.34 %
Brady Statistic RV Percent Paced: 72.44 %
Date Time Interrogation Session: 20200427041803
HighPow Impedance: 49 Ohm
HighPow Impedance: 62 Ohm
Implantable Lead Implant Date: 20110516
Implantable Lead Implant Date: 20110516
Implantable Lead Implant Date: 20110516
Implantable Lead Location: 753858
Implantable Lead Location: 753859
Implantable Lead Location: 753860
Implantable Lead Model: 5076
Implantable Lead Model: 6947
Implantable Pulse Generator Implant Date: 20170623
Lead Channel Impedance Value: 285 Ohm
Lead Channel Impedance Value: 342 Ohm
Lead Channel Impedance Value: 399 Ohm
Lead Channel Impedance Value: 399 Ohm
Lead Channel Impedance Value: 551 Ohm
Lead Channel Impedance Value: 836 Ohm
Lead Channel Pacing Threshold Amplitude: 0.875 V
Lead Channel Pacing Threshold Amplitude: 1 V
Lead Channel Pacing Threshold Amplitude: 1.125 V
Lead Channel Pacing Threshold Pulse Width: 0.4 ms
Lead Channel Pacing Threshold Pulse Width: 0.4 ms
Lead Channel Pacing Threshold Pulse Width: 0.4 ms
Lead Channel Sensing Intrinsic Amplitude: 2 mV
Lead Channel Sensing Intrinsic Amplitude: 2 mV
Lead Channel Sensing Intrinsic Amplitude: 8.875 mV
Lead Channel Sensing Intrinsic Amplitude: 8.875 mV
Lead Channel Setting Pacing Amplitude: 2 V
Lead Channel Setting Pacing Amplitude: 2.5 V
Lead Channel Setting Pacing Amplitude: 2.5 V
Lead Channel Setting Pacing Pulse Width: 0.4 ms
Lead Channel Setting Pacing Pulse Width: 0.4 ms
Lead Channel Setting Sensing Sensitivity: 0.3 mV

## 2019-03-28 ENCOUNTER — Other Ambulatory Visit: Payer: Self-pay | Admitting: Internal Medicine

## 2019-03-29 ENCOUNTER — Encounter: Payer: Self-pay | Admitting: Internal Medicine

## 2019-03-30 ENCOUNTER — Encounter: Payer: Self-pay | Admitting: Internal Medicine

## 2019-03-30 ENCOUNTER — Ambulatory Visit (INDEPENDENT_AMBULATORY_CARE_PROVIDER_SITE_OTHER): Payer: 59 | Admitting: Internal Medicine

## 2019-03-30 DIAGNOSIS — M1611 Unilateral primary osteoarthritis, right hip: Secondary | ICD-10-CM | POA: Diagnosis not present

## 2019-03-30 DIAGNOSIS — M159 Polyosteoarthritis, unspecified: Secondary | ICD-10-CM

## 2019-03-30 DIAGNOSIS — M150 Primary generalized (osteo)arthritis: Secondary | ICD-10-CM

## 2019-03-30 NOTE — Progress Notes (Signed)
Subjective:    Patient ID: Mike Jimenez, male    DOB: [birth year], 68 y.o.   MRN: [id number]  DOS:  03/30/2019 Type of visit - description: Virtual Visit via Video Note  I connected with@ on 03/30/19 at 11:00 AM EDT by a video enabled telemedicine application and verified that I am speaking with the correct person using two identifiers.   THIS ENCOUNTER IS A VIRTUAL VISIT DUE TO COVID-19 - PATIENT WAS NOT SEEN IN THE OFFICE. PATIENT HAS CONSENTED TO VIRTUAL VISIT / TELEMEDICINE VISIT   Location of patient: home  Location of provider: office  I discussed the limitations of evaluation and management by telemedicine and the availability of in person appointments. The patient expressed understanding and agreed to proceed.  History of Present Illness: Acute The patient main concern is ongoing right hip pain.  The pain is located on the lateral aspect of the right hip and also involves the lateral aspect of the right thigh. He has a constant dull ache, worse with certain positions or steps. This is bothering him during the daytime, he works standing for 10 hours straight. There are days that the pain is severe.   Review of Systems Denies any injury No rash No low back pain per se No lower extremity paresthesias or other than neuropathy which is a chronic issue  Past Medical History:  Diagnosis Date  . Anemia   . Arthritis    "knees" (09/05/2014)  . Automatic implantable cardioverter-defibrillator in situ   . CAD (coronary artery disease)    a. Mild nonobstructive CAD by cath 08/2014.  Marland Kitchen CHF (congestive heart failure) ([place])   . Childhood asthma   . Gastric ulcer 2011  . GERD (gastroesophageal reflux disease)   . History of gout   . Hyperlipidemia   . Hypertension   . LBBB (left bundle branch block)   . NICM (nonischemic cardiomyopathy) ([place])    a. Dx 2011 - presented with sustained VT. Normal coronaries at that time, EF 10-15% by cath and 20-25% by echo. b. s/p biventricular ICD  2011. c. CP 08/2014: mild nonobstructive CAD.  Marland Kitchen Obesity   . Postsurgical hypothyroidism   . Thyroid cancer ([place])    a. 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 Dr Harlow Asa. s/p thyroidectomy 2013.  . Torn meniscus   . Type II diabetes mellitus ([place])   . Ventricular tachycardia ([place])    a. Sustained VT 2011 s/p MDT [id number] Concert BiV ICD, ser# [id number].    Past Surgical History:  Procedure Laterality Date  . BI-VENTRICULAR IMPLANTABLE CARDIOVERTER DEFIBRILLATOR  (CRT-D)  03/2010  . CARDIAC CATHETERIZATION  03/2010  . EP IMPLANTABLE DEVICE N/A 05/21/2016   Procedure:  ICD Generator Changeout;  Surgeon: Evans Lance, MD;  Location: [facility];  Service: Cardiovascular;  Laterality: N/A;  . LEFT HEART CATHETERIZATION WITH CORONARY ANGIOGRAM N/A 09/06/2014   Procedure: LEFT HEART CATHETERIZATION WITH CORONARY ANGIOGRAM;  Surgeon: Burnell Blanks, MD;  Location: [facility];  Service: Cardiovascular;  Laterality: N/A;  . PTX     traumatic, age 63 months  . THYROIDECTOMY  12/10/2011   Procedure: TOTAL THYROIDECTOMY WITH CENTRAL COMPARTMENT DISSECTION;  Surgeon: Earnstine Regal, MD;  Location: [facility];  Service: General;  Laterality: N/A;  Total thyroidectomy with limited central compartment lymph node dissection.  Marland Kitchen VASECTOMY      Social History   Socioeconomic History  . Marital status: Married    Spouse name: Not on file  .  Number of children: 2  . Years of education: Not on file  . Highest education level: Not on file  Occupational History  . Occupation: Warehouse work    Fish farm manager: [employer]  Social Needs  . Financial resource strain: Not on file  . Food insecurity:    Worry: Not on file    Inability: Not on file  . Transportation needs:    Medical: Not on file    Non-medical: Not on file  Tobacco Use  . Smoking status: Former Smoker    Packs/day: 2.50    Years: 2.00    Pack years: 5.00    Types: Cigarettes    Last attempt to quit: 11/29/1970    Years  since quitting: 48.3  . Smokeless tobacco: Former Systems developer    Types: Chew  Substance and Sexual Activity  . Alcohol use: Yes    Comment: 09/05/2014 "2 drinks q couple months, if that  . Drug use: No  . Sexual activity: Yes  Lifestyle  . Physical activity:    Days per week: Not on file    Minutes per session: Not on file  . Stress: Not on file  Relationships  . Social connections:    Talks on phone: Not on file    Gets together: Not on file    Attends religious service: Not on file    Active member of club or organization: Not on file    Attends meetings of clubs or organizations: Not on file    Relationship status: Not on file  . Intimate partner violence:    Fear of current or ex partner: Not on file    Emotionally abused: Not on file    Physically abused: Not on file    Forced sexual activity: Not on file  Other Topics Concern  . Not on file  Social History Narrative   Live w/ wife , daughter [name] lives in [place]   8 dogs, fish pond.   Job is very physical.   Does not routinely exercise.      Allergies as of 03/30/2019      Reactions   Losartan Other (See Comments)   Leg cramps   Ramipril Cough      Medication List       Accurate as of Mar 30, 2019 10:59 AM. Always use your most recent med list.        acetaminophen 500 MG tablet Commonly known as:  TYLENOL Take 1,000 mg by mouth every 6 (six) hours as needed (pain).   allopurinol 100 MG tablet Commonly known as:  ZYLOPRIM Take 1 tablet (100 mg total) by mouth daily.   aspirin EC 81 MG tablet Take 1 tablet (81 mg total) by mouth daily.   atorvastatin 40 MG tablet Commonly known as:  LIPITOR Take 1 tablet (40 mg total) by mouth daily.   carvedilol 12.5 MG tablet Commonly known as:  COREG Take 1 tablet (12.5 mg total) by mouth daily.   dapagliflozin propanediol 5 MG Tabs tablet Commonly known as:  Farxiga Take 5 mg by mouth daily.   furosemide 20 MG tablet Commonly known as:  LASIX Take 1 tablet (20 mg  total) by mouth daily.   gabapentin 100 MG capsule Commonly known as:  NEURONTIN Take 2-3 capsules (200-300 mg total) by mouth at bedtime.   levothyroxine 112 MCG tablet Commonly known as:  SYNTHROID Take 224 mcg by mouth daily before breakfast.   MAGNESIUM PO Take 1 tablet by mouth daily.  metFORMIN 1000 MG tablet Commonly known as:  GLUCOPHAGE Take 1 tablet (1,000 mg total) by mouth 2 (two) times daily with a meal.   multivitamin with minerals Tabs tablet Take 1 tablet by mouth daily.   potassium chloride 10 MEQ tablet Commonly known as:  Klor-Con M10 Take 1 tablet (10 mEq total) by mouth daily.   tadalafil 20 MG tablet Commonly known as:  ADCIRCA/CIALIS Take 1 tablet (20 mg total) by mouth daily as needed for erectile dysfunction.   Trulicity 7.02 OV/7.8HY Sopn Generic drug:  Dulaglutide Inject 0.75 mg into the skin once a week.   vitamin E 400 UNIT capsule Take 400 Units by mouth daily.           Objective:   Physical Exam There were no vitals taken for this visit. This is a video virtual visit, alert oriented x3, no apparent distress    Assessment     Assessment DM,  actos intolerant 01-2018 + Mild neuropathy on exam 03/2017, HTN Hyperlipidemia Hypothyroidism h/oThyroid cancer, nodularity, no mets, thyroidectomy 2013- sees Dr Buddy Duty yearly as off 02-2018 CV: --CHF/nonischemic cardiomyopathy dx 2011 --Ventricular tachycardia, 2011, normal coronaries, EF  ~15 -20 % -- s/p  biventricular ICD 2011--- replaced 04-2016 --CAD -Nonobstructive   by cath 08-2014 --LBBB  GI: --GERD --H/o anemia:  --Gastric ulcer per EGD 07-2010 --Colonoscopy 2011:4 polyps  DJD (hip pain, XRs dosne 2017) H/o gout  PLAN  DJD, right hip pain: The patient has ongoing right hip pain, suspect DJD, on the physical exam 11-2018 he was not TTP at the trochanteric bursa.  X-ray in 2017 showed with DJD in the area. Pain is severe some days, he has a constant dull ache, particularly when  he is at work. Plan: Judicious use of ibuprofen, low-dose daily with food.  Watch for side effects.  Keep hydrated.  See message; also Tylenol as needed He is a stretching, continue doing so Refer to Ortho.  They might consider further x-rays, PT, local injection, etc.     I discussed the assessment and treatment plan with the patient. The patient was provided an opportunity to ask questions and all were answered. The patient agreed with the plan and demonstrated an understanding of the instructions.   The patient was advised to call back or seek an in-person evaluation if the symptoms worsen or if the condition fails to improve as anticipated.

## 2019-03-30 NOTE — Telephone Encounter (Signed)
LMOM informing Pt of virtual visits. Instructed to call office to set up virtual visits.

## 2019-03-30 NOTE — Telephone Encounter (Signed)
Error

## 2019-04-01 DIAGNOSIS — M1990 Unspecified osteoarthritis, unspecified site: Secondary | ICD-10-CM | POA: Insufficient documentation

## 2019-04-01 NOTE — Assessment & Plan Note (Signed)
DJD, right hip pain: The patient has ongoing right hip pain, suspect DJD, on the physical exam 11-2018 he was not TTP at the trochanteric bursa.  X-ray in 2017 showed with DJD in the area. Pain is severe some days, he has a constant dull ache, particularly when he is at work. Plan: Judicious use of ibuprofen, low-dose daily with food.  Watch for side effects.  Keep hydrated.  See message; also Tylenol as needed He is a stretching, continue doing so Refer to Ortho.  They might consider further x-rays, PT, local injection, etc.

## 2019-04-02 NOTE — Progress Notes (Signed)
Remote ICD transmission.   

## 2019-04-09 ENCOUNTER — Ambulatory Visit (INDEPENDENT_AMBULATORY_CARE_PROVIDER_SITE_OTHER): Payer: 59

## 2019-04-09 ENCOUNTER — Other Ambulatory Visit: Payer: Self-pay

## 2019-04-09 DIAGNOSIS — I5022 Chronic systolic (congestive) heart failure: Secondary | ICD-10-CM

## 2019-04-09 DIAGNOSIS — Z95810 Presence of automatic (implantable) cardiac defibrillator: Secondary | ICD-10-CM | POA: Diagnosis not present

## 2019-04-10 NOTE — Progress Notes (Signed)
EPIC Encounter for ICM Monitoring  Patient Name: Mike Jimenez is a 68 y.o. male Date: 04/10/2019 Primary Care Physican: Colon Branch, MD Primary Cardiologist:Taylor Electrophysiologist: Santina Evans Pacing: 98.2% 03/05/2019 Weight:260 lbs  Transmission reviewed.  Mychart message sent with results.   OptiVol Thoracic impedance normal.   Prescribed:Furosemide 20 mg 1tablet (20 mg total) daily. Potassium 10 mEq 1tablet (10 mEq total) daily  Labs: 12/25/2018 Creatinine 1.17, BUN 17, Potassium 4.2, Sodium 141, eGFR 62.20 10/23/2018 Creatinine 1.12, BUN 21, Potassium 4.1, Sodium 142, eGFR 69.56 05/22/2018 Creatinine 1.14, BUN 14, Potassium 4.2, Sodium 141, EGFR 68.24 03/23/2018 Creatinine1.17, BUN20, Potassium4.1, Sodium141, [id number]  Recommendations: None  Follow-up plan: ICM clinic phone appointment on6/15/2020.  Copy of ICM check sent to Dr.Taylor.   3 month ICM trend: 04/09/2019    1 Year ICM trend:       Rosalene Billings, RN 04/10/2019 10:34 AM

## 2019-05-18 ENCOUNTER — Telehealth: Payer: Self-pay

## 2019-05-18 ENCOUNTER — Ambulatory Visit (INDEPENDENT_AMBULATORY_CARE_PROVIDER_SITE_OTHER): Payer: 59

## 2019-05-18 DIAGNOSIS — I5022 Chronic systolic (congestive) heart failure: Secondary | ICD-10-CM

## 2019-05-18 DIAGNOSIS — Z95810 Presence of automatic (implantable) cardiac defibrillator: Secondary | ICD-10-CM | POA: Diagnosis not present

## 2019-05-18 NOTE — Telephone Encounter (Signed)
Remote ICM transmission received.  Attempted call to patient regarding ICM remote transmission and left detailed message, per DPR, with next ICM remote transmission date of 06/26/2019.  Advised to return call for any fluid symptoms or questions.

## 2019-05-18 NOTE — Progress Notes (Signed)
EPIC Encounter for ICM Monitoring  Patient Name: Mike Jimenez is a 68 y.o. male Date: 05/18/2019 Primary Care Physican: Colon Branch, MD Primary Cardiologist:Taylor Electrophysiologist: Santina Evans Pacing: 97.1% 4/6/2020Weight:260 lbs  Attempted call to patient and unable to reach.  Left detailed message per DPR regarding transmission. Transmission reviewed.    OptiVol Thoracic impedance normal.   Prescribed:Furosemide 20 mg 1tablet (20 mg total) daily. Potassium 10 mEq 1tablet (10 mEq total) daily  Labs: 12/25/2018 Creatinine 1.17, BUN 17, Potassium 4.2, Sodium 141, eGFR 62.20 10/23/2018 Creatinine 1.12, BUN 21, Potassium 4.1, Sodium 142, eGFR 69.56 05/22/2018 Creatinine 1.14, BUN 14, Potassium 4.2, Sodium 141, EGFR 68.24 03/23/2018 Creatinine1.17, BUN20, Potassium4.1, Sodium141, [id number]  Recommendations: Left voice mail with ICM number and encouraged to call if experiencing any fluid symptoms.  Follow-up plan: ICM clinic phone appointment on7/28/2020.  Copy of ICM check sent to Dr.Taylor.  3 month ICM trend: 05/14/2019    1 Year ICM trend:       Rosalene Billings, RN 05/18/2019 4:55 PM

## 2019-06-25 ENCOUNTER — Ambulatory Visit (INDEPENDENT_AMBULATORY_CARE_PROVIDER_SITE_OTHER): Payer: 59 | Admitting: *Deleted

## 2019-06-25 DIAGNOSIS — I472 Ventricular tachycardia, unspecified: Secondary | ICD-10-CM

## 2019-06-25 DIAGNOSIS — I428 Other cardiomyopathies: Secondary | ICD-10-CM

## 2019-06-25 LAB — CUP PACEART REMOTE DEVICE CHECK
Battery Remaining Longevity: 56 mo
Battery Voltage: 2.97 V
Brady Statistic AP VP Percent: 0.92 %
Brady Statistic AP VS Percent: 0.03 %
Brady Statistic AS VP Percent: 97.27 %
Brady Statistic AS VS Percent: 1.78 %
Brady Statistic RA Percent Paced: 0.95 %
Brady Statistic RV Percent Paced: 72.94 %
Date Time Interrogation Session: 20200727062603
HighPow Impedance: 45 Ohm
HighPow Impedance: 59 Ohm
Implantable Lead Implant Date: 20110516
Implantable Lead Implant Date: 20110516
Implantable Lead Implant Date: 20110516
Implantable Lead Location: 753858
Implantable Lead Location: 753859
Implantable Lead Location: 753860
Implantable Lead Model: 5076
Implantable Lead Model: 6947
Implantable Pulse Generator Implant Date: 20170623
Lead Channel Impedance Value: 323 Ohm
Lead Channel Impedance Value: 342 Ohm
Lead Channel Impedance Value: 399 Ohm
Lead Channel Impedance Value: 399 Ohm
Lead Channel Impedance Value: 551 Ohm
Lead Channel Impedance Value: 874 Ohm
Lead Channel Pacing Threshold Amplitude: 0.625 V
Lead Channel Pacing Threshold Amplitude: 1 V
Lead Channel Pacing Threshold Amplitude: 1.25 V
Lead Channel Pacing Threshold Pulse Width: 0.4 ms
Lead Channel Pacing Threshold Pulse Width: 0.4 ms
Lead Channel Pacing Threshold Pulse Width: 0.4 ms
Lead Channel Sensing Intrinsic Amplitude: 12.375 mV
Lead Channel Sensing Intrinsic Amplitude: 12.375 mV
Lead Channel Sensing Intrinsic Amplitude: 3 mV
Lead Channel Sensing Intrinsic Amplitude: 3 mV
Lead Channel Setting Pacing Amplitude: 2 V
Lead Channel Setting Pacing Amplitude: 2.5 V
Lead Channel Setting Pacing Amplitude: 2.5 V
Lead Channel Setting Pacing Pulse Width: 0.4 ms
Lead Channel Setting Pacing Pulse Width: 0.4 ms
Lead Channel Setting Sensing Sensitivity: 0.3 mV

## 2019-06-26 ENCOUNTER — Ambulatory Visit (INDEPENDENT_AMBULATORY_CARE_PROVIDER_SITE_OTHER): Payer: 59

## 2019-06-26 DIAGNOSIS — I5022 Chronic systolic (congestive) heart failure: Secondary | ICD-10-CM | POA: Diagnosis not present

## 2019-06-26 DIAGNOSIS — Z95810 Presence of automatic (implantable) cardiac defibrillator: Secondary | ICD-10-CM | POA: Diagnosis not present

## 2019-06-29 ENCOUNTER — Other Ambulatory Visit: Payer: Self-pay | Admitting: Internal Medicine

## 2019-06-29 NOTE — Progress Notes (Signed)
EPIC Encounter for ICM Monitoring  Patient Name: Mike Jimenez is a 68 y.o. male Date: 06/29/2019 Primary Care Physican: Colon Branch, MD Primary Cardiologist:Taylor Electrophysiologist: Santina Evans Pacing: 98.1% 04/06/2020Weight:260 lbs   Transmission reviewed and results sent via mychart.   OptiVolThoracic impedance normal but was suggestive of possible fluid accumulation 7/14 - 7/26.   Prescribed:Furosemide 20 mg 1tablet (20 mg total) daily. Potassium 10 mEq 1tablet (10 mEq total) daily  Labs: 12/25/2018 Creatinine 1.17, BUN 17, Potassium 4.2, Sodium 141, eGFR 62.20 10/23/2018 Creatinine 1.12, BUN 21, Potassium 4.1, Sodium 142, eGFR 69.56 05/22/2018 Creatinine 1.14, BUN 14, Potassium 4.2, Sodium 141, EGFR 68.24 03/23/2018 Creatinine1.17, BUN20, Potassium4.1, Sodium141, AFOA55.25  Recommendations: None  Follow-up plan: ICM clinic phone appointment on8/31/2020.  Copy of ICM check sent to Dr.Taylor.   3 month ICM trend: 06/26/2019    1 Year ICM trend:       Rosalene Billings, RN 06/29/2019 10:02 AM

## 2019-06-30 ENCOUNTER — Other Ambulatory Visit: Payer: Self-pay | Admitting: Internal Medicine

## 2019-07-03 ENCOUNTER — Other Ambulatory Visit: Payer: Self-pay | Admitting: Internal Medicine

## 2019-07-06 ENCOUNTER — Other Ambulatory Visit: Payer: Self-pay | Admitting: Internal Medicine

## 2019-07-06 NOTE — Telephone Encounter (Signed)
Mike Jimenez no longer covered by insurance, preferred: [place] or [facility].

## 2019-07-06 NOTE — Telephone Encounter (Signed)
Diabetes is managed by endocrinology, refer question to them

## 2019-07-09 NOTE — Progress Notes (Signed)
Remote ICD transmission.   

## 2019-07-30 ENCOUNTER — Ambulatory Visit (INDEPENDENT_AMBULATORY_CARE_PROVIDER_SITE_OTHER): Payer: 59

## 2019-07-30 DIAGNOSIS — Z95810 Presence of automatic (implantable) cardiac defibrillator: Secondary | ICD-10-CM | POA: Diagnosis not present

## 2019-07-30 DIAGNOSIS — I5022 Chronic systolic (congestive) heart failure: Secondary | ICD-10-CM | POA: Diagnosis not present

## 2019-07-31 NOTE — Progress Notes (Signed)
EPIC Encounter for ICM Monitoring  Patient Name: Mike Jimenez is a 68 y.o. male Date: 07/31/2019 Primary Care Physican: Colon Branch, MD Primary Cardiologist:Taylor Electrophysiologist: Santina Evans Pacing: 97.5% Weight:260 lbs   Attempted call to patient and unable to reach.  Left detailed message per DPR regarding transmission. Transmission reviewed.   OptiVolThoracic impedance normal.   Prescribed:Furosemide 20 mg 1tablet (20 mg total) daily. Potassium 10 mEq 1tablet (10 mEq total) daily  Labs: 12/25/2018 Creatinine 1.17, BUN 17, Potassium 4.2, Sodium 141, eGFR 62.20  Recommendations: Left voice mail with ICM number and encouraged to call if experiencing any fluid symptoms.  Follow-up plan: ICM clinic phone appointment on 09/26/2019.   91 day device clinic remote transmission 09/25/2019.      Copy of ICM check sent to Dr. Lovena Le.   3 month ICM trend: 07/30/2019    1 Year ICM trend:       Rosalene Billings, RN 07/31/2019 1:38 PM

## 2019-08-01 ENCOUNTER — Telehealth: Payer: Self-pay

## 2019-08-01 NOTE — Telephone Encounter (Signed)
Remote ICM transmission received.  Attempted call to patient regarding ICM remote transmission and left detailed message per DPR.  Advised to return call for any fluid symptoms or questions.  

## 2019-08-30 ENCOUNTER — Other Ambulatory Visit: Payer: Self-pay | Admitting: Internal Medicine

## 2019-09-25 ENCOUNTER — Ambulatory Visit (INDEPENDENT_AMBULATORY_CARE_PROVIDER_SITE_OTHER): Payer: 59 | Admitting: *Deleted

## 2019-09-25 DIAGNOSIS — I472 Ventricular tachycardia, unspecified: Secondary | ICD-10-CM

## 2019-09-25 DIAGNOSIS — I428 Other cardiomyopathies: Secondary | ICD-10-CM

## 2019-09-25 LAB — CUP PACEART REMOTE DEVICE CHECK
Battery Remaining Longevity: 50 mo
Battery Voltage: 2.96 V
Brady Statistic AP VP Percent: 0.93 %
Brady Statistic AP VS Percent: 0.03 %
Brady Statistic AS VP Percent: 97.51 %
Brady Statistic AS VS Percent: 1.54 %
Brady Statistic RA Percent Paced: 0.96 %
Brady Statistic RV Percent Paced: 72.83 %
Date Time Interrogation Session: 20201027133624
HighPow Impedance: 46 Ohm
HighPow Impedance: 58 Ohm
Implantable Lead Implant Date: 20110516
Implantable Lead Implant Date: 20110516
Implantable Lead Implant Date: 20110516
Implantable Lead Location: 753858
Implantable Lead Location: 753859
Implantable Lead Location: 753860
Implantable Lead Model: 5076
Implantable Lead Model: 6947
Implantable Pulse Generator Implant Date: 20170623
Lead Channel Impedance Value: 323 Ohm
Lead Channel Impedance Value: 342 Ohm
Lead Channel Impedance Value: 399 Ohm
Lead Channel Impedance Value: 399 Ohm
Lead Channel Impedance Value: 551 Ohm
Lead Channel Impedance Value: 836 Ohm
Lead Channel Pacing Threshold Amplitude: 0.75 V
Lead Channel Pacing Threshold Amplitude: 1 V
Lead Channel Pacing Threshold Amplitude: 1.25 V
Lead Channel Pacing Threshold Pulse Width: 0.4 ms
Lead Channel Pacing Threshold Pulse Width: 0.4 ms
Lead Channel Pacing Threshold Pulse Width: 0.4 ms
Lead Channel Sensing Intrinsic Amplitude: 11.125 mV
Lead Channel Sensing Intrinsic Amplitude: 11.125 mV
Lead Channel Sensing Intrinsic Amplitude: 2.625 mV
Lead Channel Sensing Intrinsic Amplitude: 2.625 mV
Lead Channel Setting Pacing Amplitude: 2 V
Lead Channel Setting Pacing Amplitude: 2.5 V
Lead Channel Setting Pacing Amplitude: 2.5 V
Lead Channel Setting Pacing Pulse Width: 0.4 ms
Lead Channel Setting Pacing Pulse Width: 0.4 ms
Lead Channel Setting Sensing Sensitivity: 0.3 mV

## 2019-09-26 ENCOUNTER — Ambulatory Visit (INDEPENDENT_AMBULATORY_CARE_PROVIDER_SITE_OTHER): Payer: 59

## 2019-09-26 ENCOUNTER — Telehealth: Payer: Self-pay

## 2019-09-26 DIAGNOSIS — I5022 Chronic systolic (congestive) heart failure: Secondary | ICD-10-CM | POA: Diagnosis not present

## 2019-09-26 DIAGNOSIS — Z95810 Presence of automatic (implantable) cardiac defibrillator: Secondary | ICD-10-CM | POA: Diagnosis not present

## 2019-09-26 NOTE — Progress Notes (Signed)
EPIC Encounter for ICM Monitoring  Patient Name: Mike Jimenez is a 68 y.o. male Date: 09/26/2019 Primary Care Physican: Colon Branch, MD Primary Cardiologist:Taylor Electrophysiologist: Santina Evans Pacing: 98% Last Weight:260 lbs   Attempted call to patient and unable to reach.  Left detailed message per DPR regarding transmission. Transmission reviewed.   OptiVolThoracic impedance normal.  Prescribed:Furosemide 20 mg 1tablet (20 mg total) daily. Potassium 10 mEq 1tablet (10 mEq total) daily  Labs: 12/25/2018 Creatinine 1.17, BUN 17, Potassium 4.2, Sodium 141, eGFR 62.20  Recommendations: Left voice mail with ICM number and encouraged to call if experiencing any fluid symptoms..  Follow-up plan: ICM clinic phone appointment on 10/01/2019 to recheck fluid levels.       Copy of ICM check sent to Dr. Lovena Le.   3 month ICM trend: 09/25/2019    1 Year ICM trend:       Rosalene Billings, RN 09/26/2019 11:29 AM

## 2019-09-26 NOTE — Telephone Encounter (Signed)
Remote ICM transmission received.  Attempted call to patient regarding ICM remote transmission and left detailed message per DPR.  Advised to return call for any fluid symptoms or questions. Next ICM remote transmission scheduled 10/01/2019.

## 2019-10-01 ENCOUNTER — Ambulatory Visit (INDEPENDENT_AMBULATORY_CARE_PROVIDER_SITE_OTHER): Payer: 59

## 2019-10-01 ENCOUNTER — Telehealth: Payer: Self-pay

## 2019-10-01 DIAGNOSIS — I5022 Chronic systolic (congestive) heart failure: Secondary | ICD-10-CM

## 2019-10-01 DIAGNOSIS — Z95810 Presence of automatic (implantable) cardiac defibrillator: Secondary | ICD-10-CM

## 2019-10-01 NOTE — Telephone Encounter (Signed)
Remote ICM transmission received.  Attempted call to patient regarding ICM remote transmission and left detailed message per DPR.  Advised to return call for any fluid symptoms or questions.  

## 2019-10-01 NOTE — Progress Notes (Signed)
EPIC Encounter for ICM Monitoring  Patient Name: Mike Jimenez is a 68 y.o. male Date: 10/01/2019 Primary Care Physican: Colon Branch, MD Primary Cardiologist:Taylor Electrophysiologist: Santina Evans Pacing: 97.9% Last Weight:260 lbs   Attempted call to patient and unable to reach. Left detailed message per DPR regarding transmission. Transmission reviewed.   OptiVolThoracic impedance returned to normal since 09/26/2019 remote transmission.  Prescribed:Furosemide 20 mg 1tablet (20 mg total) daily. Potassium 10 mEq 1tablet (10 mEq total) daily  Labs: 12/25/2018 Creatinine 1.17, BUN 17, Potassium 4.2, Sodium 141, eGFR 62.20  Recommendations:  Left voice mail with ICM number and encouraged to call if experiencing any fluid symptoms..  Follow-up plan: ICM clinic phone appointment on 11/05/2019.       Copy of ICM check sent to Dr. Lovena Le.   3 month ICM trend: 10/01/2019    1 Year ICM trend:       Rosalene Billings, RN 10/01/2019 11:11 AM

## 2019-10-02 ENCOUNTER — Encounter: Payer: Self-pay | Admitting: Internal Medicine

## 2019-10-03 ENCOUNTER — Other Ambulatory Visit: Payer: Self-pay

## 2019-10-03 MED ORDER — FUROSEMIDE 20 MG PO TABS: 20.0000 mg | ORAL_TABLET | Freq: Every day | ORAL | 0 refills | Status: DC

## 2019-10-03 MED ORDER — POTASSIUM CHLORIDE CRYS ER 10 MEQ PO TBCR: 10.0000 meq | EXTENDED_RELEASE_TABLET | Freq: Every day | ORAL | 0 refills | Status: DC

## 2019-10-05 ENCOUNTER — Other Ambulatory Visit: Payer: Self-pay

## 2019-10-05 ENCOUNTER — Ambulatory Visit: Payer: 59 | Admitting: Internal Medicine

## 2019-10-05 ENCOUNTER — Encounter: Payer: Self-pay | Admitting: Internal Medicine

## 2019-10-05 VITALS — BP 155/86 | HR 68 | Temp 97.2°F | Resp 16 | Ht 73.0 in | Wt 256.1 lb

## 2019-10-05 DIAGNOSIS — E118 Type 2 diabetes mellitus with unspecified complications: Secondary | ICD-10-CM | POA: Diagnosis not present

## 2019-10-05 DIAGNOSIS — I10 Essential (primary) hypertension: Secondary | ICD-10-CM

## 2019-10-05 DIAGNOSIS — M1A9XX0 Chronic gout, unspecified, without tophus (tophi): Secondary | ICD-10-CM

## 2019-10-05 DIAGNOSIS — E785 Hyperlipidemia, unspecified: Secondary | ICD-10-CM

## 2019-10-05 DIAGNOSIS — I5022 Chronic systolic (congestive) heart failure: Secondary | ICD-10-CM

## 2019-10-05 MED ORDER — CARVEDILOL 12.5 MG PO TABS: 12.5000 mg | ORAL_TABLET | Freq: Every day | ORAL | 1 refills | Status: DC

## 2019-10-05 MED ORDER — ALLOPURINOL 100 MG PO TABS: 100.0000 mg | ORAL_TABLET | Freq: Every day | ORAL | 3 refills | Status: DC

## 2019-10-05 MED ORDER — FUROSEMIDE 20 MG PO TABS: 20.0000 mg | ORAL_TABLET | Freq: Every day | ORAL | 6 refills | Status: DC

## 2019-10-05 MED ORDER — POTASSIUM CHLORIDE CRYS ER 10 MEQ PO TBCR: 10.0000 meq | EXTENDED_RELEASE_TABLET | Freq: Every day | ORAL | 6 refills | Status: DC

## 2019-10-05 NOTE — Patient Instructions (Addendum)
  GO TO THE FRONT DESK Schedule your next appointment    for blood work only 2 weeks from today  We will see you in January, the schedule is already set  We will refer you to cardiology for a routine checkup    Check the weight and your blood pressure 2 or 3 times a week BP GOAL is between 110/65 and  135/85. If it is consistently higher or lower, let me know

## 2019-10-05 NOTE — Progress Notes (Signed)
Subjective:    Patient ID: Mike Jimenez, male    DOB: 05-02-1951, 68 y.o.   MRN: [id number]  DOS:  10/05/2019 Type of visit - description: Acute visit  See patient's message, he was concerned about Lasix, he felt somewhat dehydrated with it.  Had dry mouth, felt slightly weak, symptoms better after he drinks water. He also was having leg cramps. Eventually he ran out and stopped taking Lasix.  The leg cramps are about the same.  HTN: Normal ambulatory blood pressures  CHF: Weight has not increased even after he stopped Lasix if anything he is eating healthier and has lost 4 pounds.  DM: Good compliance with medication.  Ambulatory BPs in the 120s.   Wt Readings from Last 3 Encounters:  10/05/19 256 lb 2 oz (116.2 kg)  01/01/19 260 lb 9.6 oz (118.2 kg)  12/25/18 259 lb 4 oz (117.6 kg)     Review of Systems Denies fever chills No chest pain, difficulty breathing or lower extremity edema  Past Medical History:  Diagnosis Date  . Anemia   . Arthritis    "knees" (09/05/2014)  . Automatic implantable cardioverter-defibrillator in situ   . CAD (coronary artery disease)    a. Mild nonobstructive CAD by cath 08/2014.  Marland Kitchen CHF (congestive heart failure) ([place])   . Childhood asthma   . Gastric ulcer 2011  . GERD (gastroesophageal reflux disease)   . History of gout   . Hyperlipidemia   . Hypertension   . LBBB (left bundle branch block)   . NICM (nonischemic cardiomyopathy) ([place])    a. Dx 2011 - presented with sustained VT. Normal coronaries at that time, EF 10-15% by cath and 20-25% by echo. b. s/p biventricular ICD 2011. c. CP 08/2014: mild nonobstructive CAD.  Marland Kitchen Obesity   . Postsurgical hypothyroidism   . Thyroid cancer ([place])    a. 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 Dr Harlow Asa. s/p thyroidectomy 2013.  . Torn meniscus   . Type II diabetes mellitus ([place])   . Ventricular tachycardia ([place])    a. Sustained VT 2011 s/p MDT [id number] Concert BiV ICD, ser# [id number].     Past Surgical History:  Procedure Laterality Date  . BI-VENTRICULAR IMPLANTABLE CARDIOVERTER DEFIBRILLATOR  (CRT-D)  03/2010  . CARDIAC CATHETERIZATION  03/2010  . EP IMPLANTABLE DEVICE N/A 05/21/2016   Procedure:  ICD Generator Changeout;  Surgeon: Evans Lance, MD;  Location: [facility];  Service: Cardiovascular;  Laterality: N/A;  . LEFT HEART CATHETERIZATION WITH CORONARY ANGIOGRAM N/A 09/06/2014   Procedure: LEFT HEART CATHETERIZATION WITH CORONARY ANGIOGRAM;  Surgeon: Burnell Blanks, MD;  Location: [facility] CATH [facility];  Service: Cardiovascular;  Laterality: N/A;  . PTX     traumatic, age 24 months  . THYROIDECTOMY  12/10/2011   Procedure: TOTAL THYROIDECTOMY WITH CENTRAL COMPARTMENT DISSECTION;  Surgeon: Earnstine Regal, MD;  Location: [place];  Service: General;  Laterality: N/A;  Total thyroidectomy with limited central compartment lymph node dissection.  Marland Kitchen VASECTOMY      Social History   Socioeconomic History  . Marital status: Married    Spouse name: Not on file  . Number of children: 2  . Years of education: Not on file  . Highest education level: Not on file  Occupational History  . Occupation: Warehouse work    Fish farm manager: [employer]  Social Needs  . Financial resource strain: Not on file  . Food insecurity    Worry: Not on file  Inability: Not on file  . Transportation needs    Medical: Not on file    Non-medical: Not on file  Tobacco Use  . Smoking status: Former Smoker    Packs/day: 2.50    Years: 2.00    Pack years: 5.00    Types: Cigarettes    Quit date: 11/29/1970    Years since quitting: 48.8  . Smokeless tobacco: Former Systems developer    Types: Chew  Substance and Sexual Activity  . Alcohol use: Yes    Comment: 09/05/2014 "2 drinks q couple months, if that  . Drug use: No  . Sexual activity: Yes  Lifestyle  . Physical activity    Days per week: Not on file    Minutes per session: Not on file  . Stress: Not on file  Relationships  . Social Product manager on phone: Not on file    Gets together: Not on file    Attends religious service: Not on file    Active member of club or organization: Not on file    Attends meetings of clubs or organizations: Not on file    Relationship status: Not on file  . Intimate partner violence    Fear of current or ex partner: Not on file    Emotionally abused: Not on file    Physically abused: Not on file    Forced sexual activity: Not on file  Other Topics Concern  . Not on file  Social History Narrative   Live w/ wife , daughter [name] lives in [place]   8 dogs, fish pond.   Job is very physical.   Does not routinely exercise.      Allergies as of 10/05/2019      Reactions   Losartan Other (See Comments)   Leg cramps   Ramipril Cough      Medication List       Accurate as of October 05, 2019 11:59 PM. If you have any questions, ask your nurse or doctor.        acetaminophen 500 MG tablet Commonly known as: TYLENOL Take 1,000 mg by mouth every 6 (six) hours as needed (pain).   allopurinol 100 MG tablet Commonly known as: ZYLOPRIM Take 1 tablet (100 mg total) by mouth daily.   aspirin EC 81 MG tablet Take 1 tablet (81 mg total) by mouth daily.   atorvastatin 40 MG tablet Commonly known as: LIPITOR Take 1 tablet (40 mg total) by mouth daily.   carvedilol 12.5 MG tablet Commonly known as: COREG Take 1 tablet (12.5 mg total) by mouth daily.   dapagliflozin propanediol 5 MG Tabs tablet Commonly known as: Farxiga Take 5 mg by mouth daily.   furosemide 20 MG tablet Commonly known as: LASIX Take 1 tablet (20 mg total) by mouth daily.   gabapentin 100 MG capsule Commonly known as: NEURONTIN Take 2-3 capsules (200-300 mg total) by mouth at bedtime.   levothyroxine 112 MCG tablet Commonly known as: SYNTHROID Take 224 mcg by mouth daily before breakfast.   MAGNESIUM PO Take 1 tablet by mouth daily.   metFORMIN 1000 MG tablet Commonly known as: GLUCOPHAGE Take 1 tablet (1,000 mg  total) by mouth 2 (two) times daily with a meal.   multivitamin with minerals Tabs tablet Take 1 tablet by mouth daily.   potassium chloride 10 MEQ tablet Commonly known as: Klor-Con M10 Take 1 tablet (10 mEq total) by mouth daily.   tadalafil 20 MG tablet Commonly known  as: CIALIS Take 1 tablet (20 mg total) by mouth daily as needed for erectile dysfunction.   Trulicity [id number] 0000000 Sopn Generic drug: Dulaglutide Inject 0.75 mg into the skin once a week.   vitamin E 400 UNIT capsule Take 400 Units by mouth daily.           Objective:   Physical Exam BP (!) 155/86 (BP Location: Left Arm, Patient Position: Sitting, Cuff Size: Normal)   Pulse 68   Temp (!) 97.2 F (36.2 C) (Temporal)   Resp 16   Ht 6\' 1"  (1.854 m)   Wt 256 lb 2 oz (116.2 kg)   SpO2 95%   BMI 33.79 kg/m   General:   Well developed, NAD, BMI noted. HEENT:  Normocephalic . Face symmetric, atraumatic Lungs:  CTA B Normal respiratory effort, no intercostal retractions, no accessory muscle use. Heart: RRR,  no murmur.  No pretibial edema bilaterally  Skin: Not pale. Not jaundice Neurologic:  alert & oriented X3.  Speech normal, gait appropriate for age and unassisted Psych--  Cognition and judgment appear intact.  Cooperative with normal attention span and concentration.  Behavior appropriate. No anxious or depressed appearing.      Assessment     Assessment DM,  actos intolerant 01-2018 + Mild neuropathy on exam 03/2017, HTN Hyperlipidemia Hypothyroidism h/oThyroid cancer, nodularity, no mets, thyroidectomy 2013- sees Dr Buddy Duty yearly as off 02-2018 CV: --CHF/nonischemic cardiomyopathy dx 2011 --Ventricular tachycardia, 2011, normal coronaries, EF  ~15 -20 % -- s/p  biventricular ICD 2011--- replaced 04-2016 --CAD -Nonobstructive   by cath 08-2014 --LBBB  GI: --GERD --H/o anemia:  --Gastric ulcer per EGD 07-2010 --Colonoscopy 2011:4 polyps  DJD (hip pain, XRs dosne 2017) H/o gout   PLAN  DM, typically sees endocrinology, on Farxiga since [id number], Trulicity and Metformin.  Check A1c and micro Will forward results to endocrinology HTN: Currently on carvedilol, see HPI: Self DC'd Lasix a while back, BP today is elevated.  Plan: Restart Lasix, continue carvedilol and potassium.  Check CMP in 2 weeks Hyperlipidemia: on atorvastatin, checking FLP CHF: As above, I am recommending to restart Lasix.  He feels he might be dehydrated, I encouraged him to check blood pressure, watch for orthostatic weakness, keep himself hydrated , avoid excessive salt,  refer to cardiology . Last EF 2015 was 20 to 25%. Gout: asx , RF meds Preventive care: Had a flu shot already RTC for labs in 2 weeks, visit already scheduled to see me in January 2021

## 2019-10-05 NOTE — Progress Notes (Signed)
Pre visit review using our clinic review tool, if applicable. No additional management support is needed unless otherwise documented below in the visit note. 

## 2019-10-06 NOTE — Assessment & Plan Note (Signed)
DM, typically sees endocrinology, on Farxiga since [id number], Trulicity and Metformin.  Check A1c and micro Will forward results to endocrinology HTN: Currently on carvedilol, see HPI: Self DC'd Lasix a while back, BP today is elevated.  Plan: Restart Lasix, continue carvedilol and potassium.  Check CMP in 2 weeks Hyperlipidemia: on atorvastatin, checking FLP CHF: As above, I am recommending to restart Lasix.  He feels he might be dehydrated, I encouraged him to check blood pressure, watch for orthostatic weakness, keep himself hydrated , avoid excessive salt,  refer to cardiology . Last EF 2015 was 20 to 25%. Gout: asx , RF meds Preventive care: Had a flu shot already RTC for labs in 2 weeks, visit already scheduled to see me in January 2021

## 2019-10-15 NOTE — Progress Notes (Signed)
Remote ICD transmission.   

## 2019-10-16 LAB — BASIC METABOLIC PANEL
BUN: 22 — AB (ref 4–21)
CO2: 29 — AB (ref 13–22)
Chloride: 104 (ref 99–108)
Creatinine: 1.2 (ref 0.6–1.3)
Glucose: 107
Potassium: 3.9 (ref 3.4–5.3)
Sodium: 142 (ref 137–147)

## 2019-10-16 LAB — HEPATIC FUNCTION PANEL
ALT: 16 (ref 10–40)
AST: 17 (ref 14–40)
Alkaline Phosphatase: 77 (ref 25–125)
Bilirubin, Total: 0.8

## 2019-10-16 LAB — COMPREHENSIVE METABOLIC PANEL
Albumin: 4.7 (ref 3.5–5.0)
Calcium: 9.1 (ref 8.7–10.7)
GFR calc Af Amer: 74
GFR calc non Af Amer: 61

## 2019-10-16 LAB — LIPID PANEL
Cholesterol: 127 (ref 0–200)
HDL: 34 — AB (ref 35–70)
LDL Cholesterol: 64
Triglycerides: 146 (ref 40–160)

## 2019-10-16 LAB — HEMOGLOBIN A1C: Hemoglobin A1C: 7.1

## 2019-10-16 LAB — VITAMIN B12: Vitamin B-12: 519

## 2019-10-16 LAB — TSH: TSH: 0.07 — AB (ref 0.41–5.90)

## 2019-10-16 LAB — MICROALBUMIN, URINE: Microalb, Ur: 29.7

## 2019-10-16 LAB — HM DIABETES FOOT EXAM

## 2019-10-17 ENCOUNTER — Encounter: Payer: Self-pay | Admitting: Internal Medicine

## 2019-10-17 ENCOUNTER — Encounter: Payer: Self-pay | Admitting: General Practice

## 2019-10-17 DIAGNOSIS — Z20822 Contact with and (suspected) exposure to covid-19: Secondary | ICD-10-CM

## 2019-10-17 DIAGNOSIS — Z20828 Contact with and (suspected) exposure to other viral communicable diseases: Secondary | ICD-10-CM

## 2019-10-18 ENCOUNTER — Other Ambulatory Visit: Payer: Self-pay

## 2019-10-18 DIAGNOSIS — Z20822 Contact with and (suspected) exposure to covid-19: Secondary | ICD-10-CM

## 2019-10-19 ENCOUNTER — Ambulatory Visit: Payer: 59 | Admitting: Internal Medicine

## 2019-10-21 LAB — NOVEL CORONAVIRUS, NAA: SARS-CoV-2, NAA: NOT DETECTED

## 2019-10-22 ENCOUNTER — Telehealth: Payer: Self-pay | Admitting: Internal Medicine

## 2019-10-22 NOTE — Telephone Encounter (Signed)
Called left 2 vm

## 2019-10-22 NOTE — Telephone Encounter (Signed)
-----   Message from Colon Branch, MD sent at 10/21/2019  5:00 PM EST ----- Regarding: Judeen Hammans, please arrange a lab appointment for the patient.  Vilma PraderJuluis Rainier

## 2019-10-23 ENCOUNTER — Encounter: Payer: Self-pay | Admitting: Internal Medicine

## 2019-10-23 ENCOUNTER — Telehealth: Payer: Self-pay | Admitting: Internal Medicine

## 2019-10-23 NOTE — Telephone Encounter (Signed)
Spoke w/ Pt- informed to reach out to Dr. Buddy Duty for Trulicity reflls. Pt verbalized understanding.

## 2019-10-23 NOTE — Telephone Encounter (Signed)
Diabetes medicines refilled by Dr.Kerr

## 2019-10-23 NOTE — Telephone Encounter (Signed)
I don't see where you have ever refilled/prescribed Trulicity. Please advise.

## 2019-10-23 NOTE — Telephone Encounter (Signed)
Pt is requesting a refill for Dulaglutide (TRULICITY) [id number] SOPN   Pharmacy: [facility], [place] [address]

## 2019-10-26 ENCOUNTER — Other Ambulatory Visit: Payer: Self-pay | Admitting: Internal Medicine

## 2019-10-29 ENCOUNTER — Encounter: Payer: Self-pay | Admitting: Internal Medicine

## 2019-11-01 ENCOUNTER — Other Ambulatory Visit: Payer: Self-pay

## 2019-11-02 ENCOUNTER — Ambulatory Visit: Payer: 59 | Admitting: Internal Medicine

## 2019-11-02 ENCOUNTER — Other Ambulatory Visit (INDEPENDENT_AMBULATORY_CARE_PROVIDER_SITE_OTHER): Payer: 59

## 2019-11-02 ENCOUNTER — Other Ambulatory Visit: Payer: Self-pay

## 2019-11-02 ENCOUNTER — Encounter: Payer: Self-pay | Admitting: Internal Medicine

## 2019-11-02 VITALS — BP 145/63 | HR 63 | Temp 96.9°F | Resp 18 | Ht 73.0 in | Wt 257.0 lb

## 2019-11-02 DIAGNOSIS — E785 Hyperlipidemia, unspecified: Secondary | ICD-10-CM

## 2019-11-02 DIAGNOSIS — E118 Type 2 diabetes mellitus with unspecified complications: Secondary | ICD-10-CM

## 2019-11-02 DIAGNOSIS — I5022 Chronic systolic (congestive) heart failure: Secondary | ICD-10-CM | POA: Diagnosis not present

## 2019-11-02 DIAGNOSIS — I10 Essential (primary) hypertension: Secondary | ICD-10-CM | POA: Diagnosis not present

## 2019-11-02 LAB — COMPREHENSIVE METABOLIC PANEL
ALT: 18 U/L (ref 0–53)
AST: 17 U/L (ref 0–37)
Albumin: 4.4 g/dL (ref 3.5–5.2)
Alkaline Phosphatase: 86 U/L (ref 39–117)
BUN: 21 mg/dL (ref 6–23)
CO2: 28 mEq/L (ref 19–32)
Calcium: 8.9 mg/dL (ref 8.4–10.5)
Chloride: 103 mEq/L (ref 96–112)
Creatinine, Ser: 1.04 mg/dL (ref 0.40–1.50)
GFR: 71.07 mL/min (ref >60.00)
Glucose, Bld: 129 mg/dL — ABNORMAL HIGH (ref 70–99)
Potassium: 3.8 mEq/L (ref 3.5–5.1)
Sodium: 141 mEq/L (ref 135–145)
Total Bilirubin: 0.7 mg/dL (ref 0.2–1.2)
Total Protein: 7 g/dL (ref 6.0–8.3)

## 2019-11-02 LAB — HEMOGLOBIN A1C: Hgb A1c MFr Bld: 7.2 % — ABNORMAL HIGH (ref 4.6–6.5)

## 2019-11-02 LAB — LIPID PANEL
Cholesterol: 161 mg/dL (ref 0–200)
HDL: 36.8 mg/dL — ABNORMAL LOW (ref >39.00)
LDL Cholesterol: 96 mg/dL (ref 0–99)
NonHDL: 124.49
Total CHOL/HDL Ratio: 4
Triglycerides: 143 mg/dL (ref 0.0–149.0)
VLDL: 28.6 mg/dL (ref 0.0–40.0)

## 2019-11-02 LAB — MICROALBUMIN / CREATININE URINE RATIO
Creatinine,U: 101 mg/dL
Microalb Creat Ratio: 83.3 mg/g — ABNORMAL HIGH (ref 0.0–30.0)
Microalb, Ur: 84.1 mg/dL — ABNORMAL HIGH (ref 0.0–1.9)

## 2019-11-02 NOTE — Patient Instructions (Signed)
  Check the  blood pressure   weekly   BP GOAL is between 110/65 and  135/85. If it is consistently higher or lower, let me know

## 2019-11-02 NOTE — Progress Notes (Signed)
Subjective:    Patient ID: Mike Jimenez, male    DOB: December 11, 1950, 68 y.o.   MRN: [id number]  DOS:  11/02/2019 Type of visit - description: Follow-up from previous visit Since the last office visit he is taking Lasix with no apparent side effects. Ambulatory BPs in the 140/60. DM: Note from Endo reviewed Gout: No recent episodes  BP Readings from Last 3 Encounters:  11/02/19 (!) 145/63  10/05/19 (!) 155/86  01/01/19 140/78   Wt Readings from Last 3 Encounters:  11/02/19 257 lb (116.6 kg)  10/05/19 256 lb 2 oz (116.2 kg)  01/01/19 260 lb 9.6 oz (118.2 kg)     Review of Systems Denies fever chills No chest pain no difficulty breathing No edema   Past Medical History:  Diagnosis Date  . Anemia   . Arthritis    "knees" (09/05/2014)  . Automatic implantable cardioverter-defibrillator in situ   . CAD (coronary artery disease)    a. Mild nonobstructive CAD by cath 08/2014.  Marland Kitchen CHF (congestive heart failure) ([place])   . Childhood asthma   . Gastric ulcer 2011  . GERD (gastroesophageal reflux disease)   . History of gout   . Hyperlipidemia   . Hypertension   . LBBB (left bundle branch block)   . NICM (nonischemic cardiomyopathy) ([place])    a. Dx 2011 - presented with sustained VT. Normal coronaries at that time, EF 10-15% by cath and 20-25% by echo. b. s/p biventricular ICD 2011. c. CP 08/2014: mild nonobstructive CAD.  Marland Kitchen Obesity   . Postsurgical hypothyroidism   . Thyroid cancer ([place])    a. 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 Dr Harlow Asa. s/p thyroidectomy 2013.  . Torn meniscus   . Type II diabetes mellitus ([place])   . Ventricular tachycardia ([place])    a. Sustained VT 2011 s/p MDT [id number] Concert BiV ICD, ser# [id number] H.    Past Surgical History:  Procedure Laterality Date  . BI-VENTRICULAR IMPLANTABLE CARDIOVERTER DEFIBRILLATOR  (CRT-D)  03/2010  . CARDIAC CATHETERIZATION  03/2010  . EP IMPLANTABLE DEVICE N/A 05/21/2016   Procedure:  ICD Generator Changeout;   Surgeon: Evans Lance, MD;  Location: [facility];  Service: Cardiovascular;  Laterality: N/A;  . LEFT HEART CATHETERIZATION WITH CORONARY ANGIOGRAM N/A 09/06/2014   Procedure: LEFT HEART CATHETERIZATION WITH CORONARY ANGIOGRAM;  Surgeon: Burnell Blanks, MD;  Location: [facility] C[facility];  Service: Cardiovascular;  Laterality: N/A;  . PTX     traumatic, age 56 months  . THYROIDECTOMY  12/10/2011   Procedure: TOTAL THYROIDECTOMY WITH CENTRAL COMPARTMENT DISSECTION;  Surgeon: Earnstine Regal, MD;  Location: [place];  Service: General;  Laterality: N/A;  Total thyroidectomy with limited central compartment lymph node dissection.  Marland Kitchen VASECTOMY      Social History   Socioeconomic History  . Marital status: Married    Spouse name: Not on file  . Number of children: 2  . Years of education: Not on file  . Highest education level: Not on file  Occupational History  . Occupation: Warehouse work    Fish farm manager: [employer]  Social Needs  . Financial resource strain: Not on file  . Food insecurity    Worry: Not on file    Inability: Not on file  . Transportation needs    Medical: Not on file    Non-medical: Not on file  Tobacco Use  . Smoking status: Former Smoker    Packs/day: 2.50    Years: 2.00  Pack years: 5.00    Types: Cigarettes    Quit date: 11/29/1970    Years since quitting: 48.9  . Smokeless tobacco: Former Systems developer    Types: Chew  Substance and Sexual Activity  . Alcohol use: Yes    Comment: 09/05/2014 "2 drinks q couple months, if that  . Drug use: No  . Sexual activity: Yes  Lifestyle  . Physical activity    Days per week: Not on file    Minutes per session: Not on file  . Stress: Not on file  Relationships  . Social Herbalist on phone: Not on file    Gets together: Not on file    Attends religious service: Not on file    Active member of club or organization: Not on file    Attends meetings of clubs or organizations: Not on file    Relationship status: Not  on file  . Intimate partner violence    Fear of current or ex partner: Not on file    Emotionally abused: Not on file    Physically abused: Not on file    Forced sexual activity: Not on file  Other Topics Concern  . Not on file  Social History Narrative   Live w/ wife , daughter [name] lives in [place]   8 dogs, fish pond.   Job is very physical.   Does not routinely exercise.      Allergies as of 11/02/2019      Reactions   Losartan Other (See Comments)   Leg cramps   Ramipril Cough      Medication List       Accurate as of November 02, 2019 11:59 PM. If you have any questions, ask your nurse or doctor.        acetaminophen 500 MG tablet Commonly known as: TYLENOL Take 1,000 mg by mouth every 6 (six) hours as needed (pain).   allopurinol 100 MG tablet Commonly known as: ZYLOPRIM Take 1 tablet (100 mg total) by mouth daily.   aspirin EC 81 MG tablet Take 1 tablet (81 mg total) by mouth daily.   atorvastatin 40 MG tablet Commonly known as: LIPITOR Take 1 tablet (40 mg total) by mouth daily.   carvedilol 12.5 MG tablet Commonly known as: COREG Take 1 tablet (12.5 mg total) by mouth daily.   dapagliflozin propanediol 5 MG Tabs tablet Commonly known as: Farxiga Take 5 mg by mouth daily.   furosemide 20 MG tablet Commonly known as: LASIX Take 1 tablet (20 mg total) by mouth daily.   gabapentin 100 MG capsule Commonly known as: NEURONTIN Take 2-3 capsules (200-300 mg total) by mouth at bedtime.   levothyroxine 200 MCG tablet Commonly known as: SYNTHROID Take 200 mcg by mouth daily before breakfast. What changed: Another medication with the same name was removed. Continue taking this medication, and follow the directions you see here. Changed by: Kathlene November, MD   MAGNESIUM PO Take 1 tablet by mouth daily.   metFORMIN 1000 MG tablet Commonly known as: GLUCOPHAGE Take 1 tablet (1,000 mg total) by mouth 2 (two) times daily with a meal.   multivitamin with minerals  Tabs tablet Take 1 tablet by mouth daily.   potassium chloride 10 MEQ tablet Commonly known as: Klor-Con M10 Take 1 tablet (10 mEq total) by mouth daily.   tadalafil 20 MG tablet Commonly known as: CIALIS Take 1 tablet (20 mg total) by mouth daily as needed for erectile dysfunction.  Trulicity 1.5 0000000 Sopn Generic drug: Dulaglutide Inject into the skin. Take as directed What changed: Another medication with the same name was removed. Continue taking this medication, and follow the directions you see here. Changed by: Kathlene November, MD   vitamin E 400 UNIT capsule Take 400 Units by mouth daily.           Objective:   Physical Exam BP (!) 145/63 (BP Location: Left Arm, Patient Position: Sitting, Cuff Size: Normal)   Pulse 63   Temp (!) 96.9 F (36.1 C) (Temporal)   Resp 18   Ht 6\' 1"  (1.854 m)   Wt 257 lb (116.6 kg)   SpO2 95%   BMI 33.91 kg/m  General:   Well developed, NAD, BMI noted. HEENT:  Normocephalic . Face symmetric, atraumatic Lungs:  CTA B Normal respiratory effort, no intercostal retractions, no accessory muscle use. Heart: RRR,  no murmur.  No pretibial edema bilaterally  Skin: Not pale. Not jaundice Neurologic:  alert & oriented X3.  Speech normal, gait appropriate for age and unassisted Psych--  Cognition and judgment appear intact.  Cooperative with normal attention span and concentration.  Behavior appropriate. No anxious or depressed appearing.      Assessment     Assessment DM,  actos intolerant 01-2018 + Mild neuropathy on exam 03/2017, HTN Hyperlipidemia Hypothyroidism h/oThyroid cancer, nodularity, no mets, thyroidectomy 2013- sees Dr Buddy Duty yearly as off 02-2018 CV: --CHF/nonischemic cardiomyopathy dx 2011 --Ventricular tachycardia, 2011, normal coronaries, EF  ~15 -20 % -- s/p  biventricular ICD 2011--- replaced 04-2016 --CAD -Nonobstructive   by cath 08-2014 --LBBB  GI: --GERD --H/o anemia:  --Gastric ulcer per EGD 07-2010  --Colonoscopy 2011:4 polyps  DJD (hip pain, XRs dosne 2017) H/o gout  PLAN  DM: Saw endocrine 10/16/2019.  Trulicity dose to be increased soon. HTN: On carvedilol, potassium ; restarted Lasix since last OV, BMP today satisfactory, ambulatory BPs 140 over 60s.  No change High cholesterol: On atorvastatin, LDL this morning satisfactory. Patient education: He is somewhat tired about using a mask, encouraged to continue doing so on a regular basis. CHF: Back on Lasix, seems to be doing well, still reports "[place]" and he thinks could be dehydration, I encouraged him to take sips of water to take care of the dry mouth but otherwise I do not think he is dehydrated.   This visit occurred during the SARS-CoV-2 public health emergency.  Safety protocols were in place, including screening questions prior to the visit, additional usage of staff PPE, and extensive cleaning of exam room while observing appropriate contact time as indicated for disinfecting solutions.    RTC scheduled for next month, CPX

## 2019-11-02 NOTE — Progress Notes (Signed)
Pre visit review using our clinic review tool, if applicable. No additional management support is needed unless otherwise documented below in the visit note. 

## 2019-11-03 NOTE — Assessment & Plan Note (Signed)
DM: Saw endocrine 10/16/2019.  Trulicity dose to be increased soon. HTN: On carvedilol, potassium ; restarted Lasix since last OV, BMP today satisfactory, ambulatory BPs 140 over 60s.  No change High cholesterol: On atorvastatin, LDL this morning satisfactory. Patient education: He is somewhat tired about using a mask, encouraged to continue doing so on a regular basis. CHF: Back on Lasix, seems to be doing well, still reports "[place]" and he thinks could be dehydration, I encouraged him to take sips of water to take care of the dry mouth but otherwise I do not think he is dehydrated.

## 2019-11-05 ENCOUNTER — Ambulatory Visit (INDEPENDENT_AMBULATORY_CARE_PROVIDER_SITE_OTHER): Payer: 59

## 2019-11-05 DIAGNOSIS — I5022 Chronic systolic (congestive) heart failure: Secondary | ICD-10-CM | POA: Diagnosis not present

## 2019-11-05 DIAGNOSIS — Z95810 Presence of automatic (implantable) cardiac defibrillator: Secondary | ICD-10-CM

## 2019-11-06 ENCOUNTER — Telehealth: Payer: Self-pay

## 2019-11-06 NOTE — Progress Notes (Signed)
EPIC Encounter for ICM Monitoring  Patient Name: Mike Jimenez is a 68 y.o. male Date: 11/06/2019 Primary Care Physican: Colon Branch, MD Primary Cardiologist:Taylor Electrophysiologist: Santina Evans Pacing: 97.7% 11/02/2019 Office Weight:257 lbs   Attempted call to patient and unable to reach. Left detailed message per DPR regarding transmission. Transmission reviewed.   OptiVolThoracic impedance normal.  Prescribed:Furosemide 20 mg 1tablet (20 mg total) daily. Potassium 10 mEq 1tablet (10 mEq total) daily  Labs: 12/25/2018 Creatinine 1.17, BUN 17, Potassium 4.2, Sodium 141, eGFR 62.20  Recommendations: Left voice mail with ICM number and encouraged to call if experiencing any fluid symptoms..  Follow-up plan: ICM clinic phone appointment on1/09/2020.  91 day device clinic remote transmission scheduled 12/25/2019.  Office visit with Dr Lovena Le on 12/27/2019.  Copy of ICM check sent to Dr.Taylor.   3 month ICM trend: 11/05/2019    1 Year ICM trend:       Rosalene Billings, RN 11/06/2019 10:22 AM

## 2019-11-06 NOTE — Telephone Encounter (Signed)
Remote ICM transmission received.  Attempted call to patient regarding ICM remote transmission and left detailed message per DPR.  Advised to return call for any fluid symptoms or questions. Next ICM remote transmission scheduled 09/29/2020.    

## 2019-12-10 ENCOUNTER — Ambulatory Visit (INDEPENDENT_AMBULATORY_CARE_PROVIDER_SITE_OTHER): Payer: 59

## 2019-12-10 DIAGNOSIS — I5022 Chronic systolic (congestive) heart failure: Secondary | ICD-10-CM | POA: Diagnosis not present

## 2019-12-10 DIAGNOSIS — Z95810 Presence of automatic (implantable) cardiac defibrillator: Secondary | ICD-10-CM | POA: Diagnosis not present

## 2019-12-12 ENCOUNTER — Telehealth: Payer: Self-pay

## 2019-12-12 NOTE — Progress Notes (Signed)
EPIC Encounter for ICM Monitoring  Patient Name: Mike Jimenez is a 69 y.o. male Date: 12/12/2019 Primary Care Physican: Colon Branch, MD Primary Cardiologist:Taylor Electrophysiologist: Santina Evans Pacing: 98% 11/02/2019 Office Weight:257 lbs   Attempted call to patient and unable to reach. Left detailed message per DPR regarding transmission. Transmission reviewed.   OptiVolThoracic impedancenormal.  Prescribed:Furosemide 20 mg 1tablet (20 mg total) daily. Potassium 10 mEq 1tablet (10 mEq total) daily  Labs: 12/25/2018 Creatinine 1.17, BUN 17, Potassium 4.2, Sodium 141, eGFR 62.20  Recommendations:Left voice mail with ICM number and encouraged to call if experiencing any fluid symptoms..  Follow-up plan: ICM clinic phone appointment on2/15/2021.  91 day device clinic remote transmission scheduled 12/25/2019.  Office visit with Dr Lovena Le on 12/27/2019.  Copy of ICM check sent to Dr.Taylor.  3 month ICM trend: 12/10/2019    1 Year ICM trend:       Rosalene Billings, RN 12/12/2019 8:34 AM

## 2019-12-12 NOTE — Telephone Encounter (Signed)
Remote ICM transmission received.  Attempted call to patient regarding ICM remote transmission and left detailed message per DPR.  Advised to return call for any fluid symptoms or questions. Next ICM remote transmission scheduled 01/14/2020.     

## 2019-12-25 ENCOUNTER — Ambulatory Visit (INDEPENDENT_AMBULATORY_CARE_PROVIDER_SITE_OTHER): Payer: 59 | Admitting: *Deleted

## 2019-12-25 DIAGNOSIS — I472 Ventricular tachycardia, unspecified: Secondary | ICD-10-CM

## 2019-12-25 LAB — CUP PACEART REMOTE DEVICE CHECK
Battery Remaining Longevity: 42 mo
Battery Voltage: 2.96 V
Brady Statistic AP VP Percent: 0.54 %
Brady Statistic AP VS Percent: 0.02 %
Brady Statistic AS VP Percent: 97.97 %
Brady Statistic AS VS Percent: 1.47 %
Brady Statistic RA Percent Paced: 0.55 %
Brady Statistic RV Percent Paced: 90.45 %
Date Time Interrogation Session: 20210126091805
HighPow Impedance: 48 Ohm
HighPow Impedance: 63 Ohm
Implantable Lead Implant Date: 20110516
Implantable Lead Implant Date: 20110516
Implantable Lead Implant Date: 20110516
Implantable Lead Location: 753858
Implantable Lead Location: 753859
Implantable Lead Location: 753860
Implantable Lead Model: 5076
Implantable Lead Model: 6947
Implantable Pulse Generator Implant Date: 20170623
Lead Channel Impedance Value: 266 Ohm
Lead Channel Impedance Value: 342 Ohm
Lead Channel Impedance Value: 399 Ohm
Lead Channel Impedance Value: 437 Ohm
Lead Channel Impedance Value: 570 Ohm
Lead Channel Impedance Value: 893 Ohm
Lead Channel Pacing Threshold Amplitude: 0.75 V
Lead Channel Pacing Threshold Amplitude: 0.875 V
Lead Channel Pacing Threshold Amplitude: 1 V
Lead Channel Pacing Threshold Pulse Width: 0.4 ms
Lead Channel Pacing Threshold Pulse Width: 0.4 ms
Lead Channel Pacing Threshold Pulse Width: 0.4 ms
Lead Channel Sensing Intrinsic Amplitude: 15.875 mV
Lead Channel Sensing Intrinsic Amplitude: 15.875 mV
Lead Channel Sensing Intrinsic Amplitude: 2.5 mV
Lead Channel Sensing Intrinsic Amplitude: 2.5 mV
Lead Channel Setting Pacing Amplitude: 2 V
Lead Channel Setting Pacing Amplitude: 2 V
Lead Channel Setting Pacing Amplitude: 2.5 V
Lead Channel Setting Pacing Pulse Width: 0.4 ms
Lead Channel Setting Pacing Pulse Width: 0.4 ms
Lead Channel Setting Sensing Sensitivity: 0.3 mV

## 2019-12-27 ENCOUNTER — Encounter: Payer: Self-pay | Admitting: Internal Medicine

## 2019-12-27 ENCOUNTER — Ambulatory Visit (INDEPENDENT_AMBULATORY_CARE_PROVIDER_SITE_OTHER): Payer: 59 | Admitting: Internal Medicine

## 2019-12-27 ENCOUNTER — Other Ambulatory Visit: Payer: Self-pay

## 2019-12-27 ENCOUNTER — Ambulatory Visit: Payer: 59 | Admitting: Internal Medicine

## 2019-12-27 VITALS — BP 144/72 | HR 73 | Temp 96.2°F | Resp 18 | Ht 73.0 in | Wt 255.0 lb

## 2019-12-27 VITALS — BP 142/78 | HR 63 | Ht 74.0 in | Wt 254.2 lb

## 2019-12-27 DIAGNOSIS — Z95810 Presence of automatic (implantable) cardiac defibrillator: Secondary | ICD-10-CM | POA: Diagnosis not present

## 2019-12-27 DIAGNOSIS — I5022 Chronic systolic (congestive) heart failure: Secondary | ICD-10-CM

## 2019-12-27 DIAGNOSIS — Z1211 Encounter for screening for malignant neoplasm of colon: Secondary | ICD-10-CM

## 2019-12-27 DIAGNOSIS — I10 Essential (primary) hypertension: Secondary | ICD-10-CM | POA: Diagnosis not present

## 2019-12-27 DIAGNOSIS — Z0000 Encounter for general adult medical examination without abnormal findings: Secondary | ICD-10-CM | POA: Diagnosis not present

## 2019-12-27 LAB — CBC WITH DIFFERENTIAL/PLATELET
Basophils Absolute: 0 10*3/uL (ref 0.0–0.1)
Basophils Relative: 0.4 % (ref 0.0–3.0)
Eosinophils Absolute: 0.2 10*3/uL (ref 0.0–0.7)
Eosinophils Relative: 3.4 % (ref 0.0–5.0)
HCT: 41.6 % (ref 39.0–52.0)
Hemoglobin: 14.3 g/dL (ref 13.0–17.0)
Lymphocytes Relative: 24 % (ref 12.0–46.0)
Lymphs Abs: 1.4 10*3/uL (ref 0.7–4.0)
MCHC: 34.3 g/dL (ref 30.0–36.0)
MCV: 90.2 fl (ref 78.0–100.0)
Monocytes Absolute: 0.6 10*3/uL (ref 0.1–1.0)
Monocytes Relative: 10.2 % (ref 3.0–12.0)
Neutro Abs: 3.7 10*3/uL (ref 1.4–7.7)
Neutrophils Relative %: 62 % (ref 43.0–77.0)
Platelets: 257 10*3/uL (ref 150.0–400.0)
RBC: 4.61 Mil/uL (ref 4.22–5.81)
RDW: 13.4 % (ref 11.5–15.5)
WBC: 6 10*3/uL (ref 4.0–10.5)

## 2019-12-27 LAB — PSA: PSA: 0.72 ng/mL (ref 0.10–4.00)

## 2019-12-27 NOTE — Patient Instructions (Addendum)
GO TO THE LAB : Get the blood work     [place]  Please come back for a checkup in 6 months, make an appointment    Check the  blood pressure 2 or 3 times a week BP GOAL is between 110/65 and  135/85. If it is consistently higher or lower, let me know

## 2019-12-27 NOTE — Progress Notes (Signed)
Subjective:    Patient ID: Mike Jimenez, male    DOB: 1951/05/16, 69 y.o.   MRN: [id number]  DOS:  12/27/2019 Type of visit - description: CPX In general feeling well. At some point had some dyspepsia but he eliminate sodas from his diet and now he is much better.  Specifically no nausea, vomiting, diarrhea or blood in the stools.  Occasionally he has loose stools.  BP Readings from Last 3 Encounters:  12/27/19 (!) 142/78  12/27/19 (!) 144/72  11/02/19 (!) 145/63     Review of Systems  Other than above, a 14 point review of systems is negative      Past Medical History:  Diagnosis Date  . Anemia   . Arthritis    "knees" (09/05/2014)  . Automatic implantable cardioverter-defibrillator in situ   . CAD (coronary artery disease)    a. Mild nonobstructive CAD by cath 08/2014.  Marland Kitchen CHF (congestive heart failure) ([place])   . Childhood asthma   . Gastric ulcer 2011  . GERD (gastroesophageal reflux disease)   . History of gout   . Hyperlipidemia   . Hypertension   . LBBB (left bundle branch block)   . NICM (nonischemic cardiomyopathy) ([place])    a. Dx 2011 - presented with sustained VT. Normal coronaries at that time, EF 10-15% by cath and 20-25% by echo. b. s/p biventricular ICD 2011. c. CP 08/2014: mild nonobstructive CAD.  Marland Kitchen Obesity   . Postsurgical hypothyroidism   . Thyroid cancer ([place])    a. 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 Dr Harlow Asa. s/p thyroidectomy 2013.  . Torn meniscus   . Type II diabetes mellitus ([place])   . Ventricular tachycardia ([place])    a. Sustained VT 2011 s/p MDT [id number] Concert BiV ICD, ser# [id number].    Past Surgical History:  Procedure Laterality Date  . BI-VENTRICULAR IMPLANTABLE CARDIOVERTER DEFIBRILLATOR  (CRT-D)  03/2010  . CARDIAC CATHETERIZATION  03/2010  . EP IMPLANTABLE DEVICE N/A 05/21/2016   Procedure:  ICD Generator Changeout;  Surgeon: Evans Lance, MD;  Location: [facility];  Service: Cardiovascular;  Laterality:  N/A;  . LEFT HEART CATHETERIZATION WITH CORONARY ANGIOGRAM N/A 09/06/2014   Procedure: LEFT HEART CATHETERIZATION WITH CORONARY ANGIOGRAM;  Surgeon: Burnell Blanks, MD;  Location: [facility] CATH LAB;  Service: Cardiovascular;  Laterality: N/A;  . PTX     traumatic, age 58 months  . THYROIDECTOMY  12/10/2011   Procedure: TOTAL THYROIDECTOMY WITH CENTRAL COMPARTMENT DISSECTION;  Surgeon: Earnstine Regal, MD;  Location: [place] [facility];  Service: General;  Laterality: N/A;  Total thyroidectomy with limited central compartment lymph node dissection.  Marland Kitchen VASECTOMY     Family History  Problem Relation Age of Onset  . Atrial fibrillation Father        diseased at ag 84  . Hypertension Father        alive @ 45.  Marland Kitchen Hypothyroidism Mother   . Hypertension Mother        alive @ 34.  . Diabetes Maternal Grandfather   . Diabetes Maternal Aunt   . Colon cancer Maternal Aunt   . Colon cancer Maternal Aunt   . Lung cancer Paternal Uncle   . Lung cancer Maternal Uncle   . Asthma Sister   . Hypothyroidism Daughter   . Prostate cancer Neg Hx   . CAD Neg Hx         Objective:   Physical Exam BP (!) 144/72 (BP Location: Left Arm,  Patient Position: Sitting, Cuff Size: Normal)   Pulse 73   Temp (!) 96.2 F (35.7 C) (Temporal)   Resp 18   Ht 6\' 1"  (1.854 m)   Wt 255 lb (115.7 kg)   SpO2 96%   BMI 33.64 kg/m  General: Well developed, NAD, BMI noted Neck: No mass at the site of thyroidectomy. HEENT:  Normocephalic . Face symmetric, atraumatic Lungs:  CTA B Normal respiratory effort, no intercostal retractions, no accessory muscle use. Heart: RRR,  no murmur.  No pretibial edema bilaterally  Abdomen:  Not distended, soft, non-tender. No rebound or rigidity.   DRE: Normal sphincter tone, prostate slightly enlarged but not nodular or tender.  Brown stools. Skin: Exposed areas without rash. Not pale. Not jaundice Neurologic:  alert & oriented X3.  Speech normal, gait appropriate for age and  unassisted Strength symmetric and appropriate for age.  Psych: Cognition and judgment appear intact.  Cooperative with normal attention span and concentration.  Behavior appropriate. No anxious or depressed appearing.     Assessment     Assessment DM,  actos intolerant 01-2018 + Mild neuropathy on exam 03/2017, HTN Hyperlipidemia Hypothyroidism h/oThyroid cancer, nodularity, no mets, thyroidectomy 2013- sees Dr Buddy Duty yearly as off 02-2018 CV: --CHF/nonischemic cardiomyopathy dx 2011 --Ventricular tachycardia, 2011,  EF  ~15 -20 % -- s/p  biventricular ICD 2011--- replaced 04-2016 --CAD -Nonobstructive   by cath 08-2014 --LBBB  GI: --GERD --H/o anemia:  --Gastric ulcer per EGD 07-2010 --Colonoscopy 2011:4 polyps  DJD (hip pain, XRs dosne 2017) H/o gout  PLAN  Here for CPX DM, thyroid disease: Per endocrine. HTN: BP slightly elevated today, at home in the 130s, low 140s.  Currently on carvedilol, Lasix, potassium.  I will defer medication adjustment to cardiology, he will be seen today. Hyperlipidemia: On atorvastatin, LDL in November was  35 but in December 96.  Recommend no change in medications but watch diet. History of gout: On allopurinol, recent BMP normal.  No recent episodes.  No change RTC 6 months   This visit occurred during the SARS-CoV-2 public health emergency.  Safety protocols were in place, including screening questions prior to the visit, additional usage of staff PPE, and extensive cleaning of exam room while observing appropriate contact time as indicated for disinfecting solutions.

## 2019-12-27 NOTE — Progress Notes (Signed)
HPI [name] returns today for followup. He is a pleasant 69 yo man with an non-ICM, chronic systolic CHF, and VT (sustained monomorphic). He is s/p BiV ICD implant. In the interim, he has had no chest pain or sob or ICD shock. No syncope. He was diagnosed Thyroid CA, and underwent surgery. He is on thyroid replacement. He has not had any ICD therapies in the last 2 years. He has gone back to work and he has had no edema. Allergies  Allergen Reactions  . Losartan Other (See Comments)    Leg cramps  . Ramipril Cough     Current Outpatient Medications  Medication Sig Dispense Refill  . acetaminophen (TYLENOL) 500 MG tablet Take 1,000 mg by mouth every 6 (six) hours as needed (pain).    Marland Kitchen allopurinol (ZYLOPRIM) 100 MG tablet Take 1 tablet (100 mg total) by mouth daily. 90 tablet 3  . aspirin EC 81 MG tablet Take 1 tablet (81 mg total) by mouth daily.    Marland Kitchen atorvastatin (LIPITOR) 40 MG tablet Take 1 tablet (40 mg total) by mouth daily. 90 tablet 3  . carvedilol (COREG) 12.5 MG tablet Take 1 tablet (12.5 mg total) by mouth daily. 90 tablet 1  . dapagliflozin propanediol (FARXIGA) 5 MG TABS tablet Take 5 mg by mouth daily. 90 tablet 2  . Dulaglutide (TRULICITY) 1.5 0000000 SOPN Inject into the skin. Take as directed    . furosemide (LASIX) 20 MG tablet Take 1 tablet (20 mg total) by mouth daily. 30 tablet 6  . gabapentin (NEURONTIN) 100 MG capsule Take 2-3 capsules (200-300 mg total) by mouth at bedtime. 270 capsule 1  . levothyroxine (SYNTHROID) 200 MCG tablet Take 200 mcg by mouth daily before breakfast.    . MAGNESIUM PO Take 1 tablet by mouth daily.    . metFORMIN (GLUCOPHAGE) 1000 MG tablet Take 1 tablet (1,000 mg total) by mouth 2 (two) times daily with a meal. 180 tablet 1  . Multiple Vitamin (MULTIVITAMIN WITH MINERALS) TABS tablet Take 1 tablet by mouth daily.    . potassium chloride (KLOR-CON M10) 10 MEQ tablet Take 1 tablet (10 mEq total) by mouth daily. 30 tablet 6  .  tadalafil (CIALIS) 20 MG tablet Take 1 tablet (20 mg total) by mouth daily as needed for erectile dysfunction. 10 tablet 5  . vitamin E 400 UNIT capsule Take 400 Units by mouth daily.     No current facility-administered medications for this visit.     Past Medical History:  Diagnosis Date  . Anemia   . Arthritis    "knees" (09/05/2014)  . Automatic implantable cardioverter-defibrillator in situ   . CAD (coronary artery disease)    a. Mild nonobstructive CAD by cath 08/2014.  Marland Kitchen CHF (congestive heart failure) ([place])   . Childhood asthma   . Gastric ulcer 2011  . GERD (gastroesophageal reflux disease)   . History of gout   . Hyperlipidemia   . Hypertension   . LBBB (left bundle branch block)   . NICM (nonischemic cardiomyopathy) ([place])    a. Dx 2011 - presented with sustained VT. Normal coronaries at that time, EF 10-15% by cath and 20-25% by echo. b. s/p biventricular ICD 2011. c. CP 08/2014: mild nonobstructive CAD.  Marland Kitchen Obesity   . Postsurgical hypothyroidism   . Thyroid cancer ([place])    a. 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 Dr Harlow Asa. s/p thyroidectomy 2013.  . Torn meniscus   . Type  II diabetes mellitus ([place])   . Ventricular tachycardia ([place])    a. Sustained VT 2011 s/p MDT [id number] Concert BiV ICD, ser# [id number] H.    ROS:   All systems reviewed and negative except as noted in the HPI.   Past Surgical History:  Procedure Laterality Date  . BI-VENTRICULAR IMPLANTABLE CARDIOVERTER DEFIBRILLATOR  (CRT-D)  03/2010  . CARDIAC CATHETERIZATION  03/2010  . EP IMPLANTABLE DEVICE N/A 05/21/2016   Procedure:  ICD Generator Changeout;  Surgeon: Evans Lance, MD;  Location: [facility];  Service: Cardiovascular;  Laterality: N/A;  . LEFT HEART CATHETERIZATION WITH CORONARY ANGIOGRAM N/A 09/06/2014   Procedure: LEFT HEART CATHETERIZATION WITH CORONARY ANGIOGRAM;  Surgeon: Burnell Blanks, MD;  Location: [facility] CATH LAB;  Service: Cardiovascular;  Laterality:  N/A;  . PTX     traumatic, age 54 months  . THYROIDECTOMY  12/10/2011   Procedure: TOTAL THYROIDECTOMY WITH CENTRAL COMPARTMENT DISSECTION;  Surgeon: Earnstine Regal, MD;  Location: [place];  Service: General;  Laterality: N/A;  Total thyroidectomy with limited central compartment lymph node dissection.  Marland Kitchen VASECTOMY       Family History  Problem Relation Age of Onset  . Atrial fibrillation Father        diseased at ag 4  . Hypertension Father        alive @ 20.  Marland Kitchen Hypothyroidism Mother   . Hypertension Mother        alive @ 1.  . Diabetes Maternal Grandfather   . Diabetes Maternal Aunt   . Colon cancer Maternal Aunt   . Colon cancer Maternal Aunt   . Lung cancer Paternal Uncle   . Lung cancer Maternal Uncle   . Asthma Sister   . Hypothyroidism Daughter   . Prostate cancer Neg Hx   . CAD Neg Hx      Social History   Socioeconomic History  . Marital status: Married    Spouse name: Not on file  . Number of children: 2  . Years of education: Not on file  . Highest education level: Not on file  Occupational History  . Occupation: [employer]  Tobacco Use  . Smoking status: Former Smoker    Packs/day: 2.50    Years: 2.00    Pack years: 5.00    Types: Cigarettes    Quit date: 11/29/1970    Years since quitting: 49.1  . Smokeless tobacco: Former Systems developer    Types: Chew  Substance and Sexual Activity  . Alcohol use: Yes    Comment: drinks rarely  . Drug use: No  . Sexual activity: Yes  Other Topics Concern  . Not on file  Social History Narrative   Live w/ wife , daughter [name] lives in [place]   8 dogs, fish pond.   Job is  physical.   Does not routinely exercise.   Social Determinants of Health   Financial Resource Strain:   . Difficulty of Paying Living Expenses: Not on file  Food Insecurity:   . Worried About Charity fundraiser in the Last Year: Not on file  . Ran Out of Food in the Last Year: Not on file  Transportation Needs:   . Lack of Transportation (Medical):  Not on file  . Lack of Transportation (Non-Medical): Not on file  Physical Activity:   . Days of Exercise per Week: Not on file  . Minutes of Exercise per Session: Not on file  Stress:   . Feeling of Stress :  Not on file  Social Connections:   . Frequency of Communication with Friends and Family: Not on file  . Frequency of Social Gatherings with Friends and Family: Not on file  . Attends Religious Services: Not on file  . Active Member of Clubs or Organizations: Not on file  . Attends Archivist Meetings: Not on file  . Marital Status: Not on file  Intimate Partner Violence:   . Fear of Current or Ex-Partner: Not on file  . Emotionally Abused: Not on file  . Physically Abused: Not on file  . Sexually Abused: Not on file     BP (!) 142/78   Pulse 63   Ht 6\' 2"  (1.88 m)   Wt 254 lb 3.2 oz (115.3 kg)   SpO2 96%   BMI 32.64 kg/m   Physical Exam:  Well appearing NAD HEENT: Unremarkable Neck:  No JVD, no thyromegally Lymphatics:  No adenopathy Back:  No CVA tenderness Lungs:  Clear with no wheezes HEART:  Regular rate rhythm, no murmurs, no rubs, no clicks Abd:  soft, positive bowel sounds, no organomegally, no rebound, no guarding Ext:  2 plus pulses, no edema, no cyanosis, no clubbing Skin:  No rashes no nodules Neuro:  CN II through XII intact, motor grossly intact  ECG - NSR with biv pacing  DEVICE  Normal device function.  See PaceArt for details.   Assess/Plan: 1. VT - he has not had any ICD therapies.  2. Chronic systolic heart failure- he remains class 2. He denies sob. No edema. 3. ICD - his medtronic Biv ICD is working normally.   Mikle Bosworth.D.

## 2019-12-27 NOTE — Progress Notes (Signed)
Pre visit review using our clinic review tool, if applicable. No additional management support is needed unless otherwise documented below in the visit note. 

## 2019-12-27 NOTE — Patient Instructions (Signed)
Medication Instructions:  Your physician recommends that you continue on your current medications as directed. Please refer to the Current Medication list given to you today.  Labwork: None ordered.  Testing/Procedures: None ordered.  Follow-Up: Your physician wants you to follow-up in: one year with Dr. Lovena Le.   You will receive a reminder letter in the mail two months in advance. If you don't receive a letter, please call our office to schedule the follow-up appointment.  Remote monitoring is used to monitor your ICD from home. This monitoring reduces the number of office visits required to check your device to one time per year. It allows [place] to keep an eye on the functioning of your device to ensure it is working properly. You are scheduled for a device check from home on 01/14/2020. You may send your transmission at any time that day. If you have a wireless device, the transmission will be sent automatically.  Any Other Special Instructions Will Be Listed Below (If Applicable).  If you need a refill on your cardiac medications before your next appointment, please call your pharmacy.

## 2019-12-28 NOTE — Assessment & Plan Note (Signed)
-   Td 2015 - Pneumovax 2011-2018, Prevnar 2015 - [id number]; s/p shingrix  - had a flu shot [facility]: Colonoscopy 9- 2011 4 polyps, per GI letter next in 5 years.  Last year, could not pursue the colonoscopy because his wife hada motor vehicle accident, request another referral. Will do. -Prostate cancer screening:  DRE normal today, no symptoms, check a PSA -Diet and exercise discussed -Labs reviewed, will check CBC and PSA.

## 2019-12-28 NOTE — Assessment & Plan Note (Signed)
Here for CPX DM, thyroid disease: Per endocrine. HTN: BP slightly elevated today, at home in the 130s, low 140s.  Currently on carvedilol, Lasix, potassium.  I will defer medication adjustment to cardiology, he will be seen today. Hyperlipidemia: On atorvastatin, LDL in November was  29 but in December 96.  Recommend no change in medications but watch diet. History of gout: On allopurinol, recent BMP normal.  No recent episodes.  No change RTC 6 months

## 2020-01-01 LAB — CUP PACEART INCLINIC DEVICE CHECK
Date Time Interrogation Session: 20210129170133
Implantable Lead Implant Date: 20110516
Implantable Lead Implant Date: 20110516
Implantable Lead Implant Date: 20110516
Implantable Lead Location: 753858
Implantable Lead Location: 753859
Implantable Lead Location: 753860
Implantable Lead Model: 5076
Implantable Lead Model: 6947
Implantable Pulse Generator Implant Date: 20170623

## 2020-01-03 NOTE — Progress Notes (Deleted)
Subjective:   Mike Jimenez is a 69 y.o. male who presents for Medicare Annual/Subsequent preventive examination.  Pt works at [employer] 40hr/ wk. Did not like being retired.   Review of Systems:  Home Safety/Smoke Alarms: Feels safe in home. Smoke alarms in place.  Lives in split level home w/ wife and 8 dogs.   Male:   CCS- past due as of 2018    PSA-  Lab Results  Component Value Date   PSA 0.72 12/27/2019   PSA 0.84 12/19/2017   PSA 1.11 01/29/2014       Objective:    Vitals: There were no vitals taken for this visit.  There is no height or weight on file to calculate BMI.  Advanced Directives 01/01/2019 05/21/2016 03/08/2016 09/07/2014 09/05/2014 09/05/2014 09/05/2014  Does Patient Have a Medical Advance Directive? No No No No No No No  Would patient like information on creating a medical advance directive? Yes (MAU/Ambulatory/Procedural Areas - Information given) No - patient declined information No - patient declined information No - patient declined information No - patient declined information No - patient declined information No - patient declined information    Tobacco Social History   Tobacco Use  Smoking Status Former Smoker  . Packs/day: 2.50  . Years: 2.00  . Pack years: 5.00  . Types: Cigarettes  . Quit date: 11/29/1970  . Years since quitting: 49.1  Smokeless Tobacco Former Systems developer  . Types: Chew     Counseling given: Not Answered   Clinical Intake:                       Past Medical History:  Diagnosis Date  . Anemia   . Arthritis    "knees" (09/05/2014)  . Automatic implantable cardioverter-defibrillator in situ   . CAD (coronary artery disease)    a. Mild nonobstructive CAD by cath 08/2014.  Marland Kitchen CHF (congestive heart failure) ([place])   . Childhood asthma   . Gastric ulcer 2011  . GERD (gastroesophageal reflux disease)   . History of gout   . Hyperlipidemia   . Hypertension   . LBBB (left bundle branch block)   . NICM (nonischemic  cardiomyopathy) ([place])    a. Dx 2011 - presented with sustained VT. Normal coronaries at that time, EF 10-15% by cath and 20-25% by echo. b. s/p biventricular ICD 2011. c. CP 08/2014: mild nonobstructive CAD.  Marland Kitchen Obesity   . Postsurgical hypothyroidism   . Thyroid cancer ([place])    a. 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 Dr Harlow Asa. s/p thyroidectomy 2013.  . Torn meniscus   . Type II diabetes mellitus ([place])   . Ventricular tachycardia ([place])    a. Sustained VT 2011 s/p MDT [id number] Concert BiV ICD, ser# [id number].   Past Surgical History:  Procedure Laterality Date  . BI-VENTRICULAR IMPLANTABLE CARDIOVERTER DEFIBRILLATOR  (CRT-D)  03/2010  . CARDIAC CATHETERIZATION  03/2010  . EP IMPLANTABLE DEVICE N/A 05/21/2016   Procedure:  ICD Generator Changeout;  Surgeon: Evans Lance, MD;  Location: [facility];  Service: Cardiovascular;  Laterality: N/A;  . LEFT HEART CATHETERIZATION WITH CORONARY ANGIOGRAM N/A 09/06/2014   Procedure: LEFT HEART CATHETERIZATION WITH CORONARY ANGIOGRAM;  Surgeon: Burnell Blanks, MD;  Location: [facility];  Service: Cardiovascular;  Laterality: N/A;  . PTX     traumatic, age 52 months  . THYROIDECTOMY  12/10/2011   Procedure: TOTAL THYROIDECTOMY WITH CENTRAL COMPARTMENT DISSECTION;  Surgeon:  Earnstine Regal, MD;  Location: [facility];  Service: General;  Laterality: N/A;  Total thyroidectomy with limited central compartment lymph node dissection.  Marland Kitchen VASECTOMY     Family History  Problem Relation Age of Onset  . Atrial fibrillation Father        diseased at ag 19  . Hypertension Father        alive @ 63.  Marland Kitchen Hypothyroidism Mother   . Hypertension Mother        alive @ 60.  . Diabetes Maternal Grandfather   . Diabetes Maternal Aunt   . Colon cancer Maternal Aunt   . Colon cancer Maternal Aunt   . Lung cancer Paternal Uncle   . Lung cancer Maternal Uncle   . Asthma Sister   . Hypothyroidism Daughter   . Prostate cancer Neg Hx   . CAD Neg  Hx    Social History   Socioeconomic History  . Marital status: Married    Spouse name: Not on file  . Number of children: 2  . Years of education: Not on file  . Highest education level: Not on file  Occupational History  . Occupation: [employer]  Tobacco Use  . Smoking status: Former Smoker    Packs/day: 2.50    Years: 2.00    Pack years: 5.00    Types: Cigarettes    Quit date: 11/29/1970    Years since quitting: 49.1  . Smokeless tobacco: Former Systems developer    Types: Chew  Substance and Sexual Activity  . Alcohol use: Yes    Comment: drinks rarely  . Drug use: No  . Sexual activity: Yes  Other Topics Concern  . Not on file  Social History Narrative   Live w/ wife , daughter [name] lives in [place]   8 dogs, fish pond.   Job is  physical.   Does not routinely exercise.   Social Determinants of Health   Financial Resource Strain:   . Difficulty of Paying Living Expenses: Not on file  Food Insecurity:   . Worried About Charity fundraiser in the Last Year: Not on file  . Ran Out of Food in the Last Year: Not on file  Transportation Needs:   . Lack of Transportation (Medical): Not on file  . Lack of Transportation (Non-Medical): Not on file  Physical Activity:   . Days of Exercise per Week: Not on file  . Minutes of Exercise per Session: Not on file  Stress:   . Feeling of Stress : Not on file  Social Connections:   . Frequency of Communication with Friends and Family: Not on file  . Frequency of Social Gatherings with Friends and Family: Not on file  . Attends Religious Services: Not on file  . Active Member of Clubs or Organizations: Not on file  . Attends Archivist Meetings: Not on file  . Marital Status: Not on file    Outpatient Encounter Medications as of 01/04/2020  Medication Sig  . acetaminophen (TYLENOL) 500 MG tablet Take 1,000 mg by mouth every 6 (six) hours as needed (pain).  Marland Kitchen allopurinol (ZYLOPRIM) 100 MG tablet Take 1 tablet (100 mg total) by mouth  daily.  Marland Kitchen aspirin EC 81 MG tablet Take 1 tablet (81 mg total) by mouth daily.  Marland Kitchen atorvastatin (LIPITOR) 40 MG tablet Take 1 tablet (40 mg total) by mouth daily.  . carvedilol (COREG) 12.5 MG tablet Take 1 tablet (12.5 mg total) by mouth daily.  . dapagliflozin  propanediol (FARXIGA) 5 MG TABS tablet Take 5 mg by mouth daily.  . Dulaglutide (TRULICITY) 1.5 0000000 SOPN Inject into the skin. Take as directed  . furosemide (LASIX) 20 MG tablet Take 1 tablet (20 mg total) by mouth daily.  Marland Kitchen gabapentin (NEURONTIN) 100 MG capsule Take 2-3 capsules (200-300 mg total) by mouth at bedtime.  Marland Kitchen levothyroxine (SYNTHROID) 200 MCG tablet Take 200 mcg by mouth daily before breakfast.  . MAGNESIUM PO Take 1 tablet by mouth daily.  . metFORMIN (GLUCOPHAGE) 1000 MG tablet Take 1 tablet (1,000 mg total) by mouth 2 (two) times daily with a meal.  . Multiple Vitamin (MULTIVITAMIN WITH MINERALS) TABS tablet Take 1 tablet by mouth daily.  . potassium chloride (KLOR-CON M10) 10 MEQ tablet Take 1 tablet (10 mEq total) by mouth daily.  . tadalafil (CIALIS) 20 MG tablet Take 1 tablet (20 mg total) by mouth daily as needed for erectile dysfunction.  . vitamin E 400 UNIT capsule Take 400 Units by mouth daily.   No facility-administered encounter medications on file as of 01/04/2020.    Activities of Daily Living In your present state of health, do you have any difficulty performing the following activities: 10/05/2019  Hearing? N  Vision? N  Difficulty concentrating or making decisions? N  Walking or climbing stairs? N  Dressing or bathing? N  Doing errands, shopping? N  Some recent data might be hidden    Patient Care Team: Colon Branch, MD as PCP - General (Internal Medicine) Delrae Rend, MD as Consulting Physician (Endocrinology) Thalia Bloodgood, [place] as Referring Physician (Optometry)   Assessment:   This is a routine wellness examination for Mike Jimenez. Physical assessment deferred to PCP.  Exercise Activities and  Dietary recommendations   Diet (meal preparation, eat out, water intake, caffeinated beverages, dairy products, fruits and vegetables): {Desc; diets:16563} Breakfast: Lunch:  Dinner:      Goals    . continue to eat healthy and stay active with job       Fall Risk Fall Risk  10/05/2019 01/01/2019 12/25/2018 12/19/2017 11/05/2016  Falls in the past year? 0 0 0 No No  Follow up Falls evaluation completed - - - -   Depression Screen PHQ 2/9 Scores 10/05/2019 01/01/2019 12/25/2018 12/19/2017  PHQ - 2 Score 0 0 0 0  PHQ- 9 Score - 0 2 -    Cognitive Function Ad8 score reviewed for issues:  Issues making decisions:  Less interest in hobbies / activities:  Repeats questions, stories (family complaining):  Trouble using ordinary gadgets (microwave, computer, phone):  Forgets the month or year:   Mismanaging finances:   Remembering appts:  Daily problems with thinking and/or memory: Ad8 score is=     MMSE - Mini Mental State Exam 01/01/2019  Orientation to time 5  Orientation to Place 5  Registration 3  Attention/ Calculation 5  Recall 3  Language- name 2 objects 2  Language- repeat 1  Language- follow 3 step command 3  Language- read & follow direction 1  Write a sentence 1  Copy design 1  Total score 30        Immunization History  Administered Date(s) Administered  . Influenza Split 09/08/2011, 07/20/2018  . Influenza Whole 08/17/2010  . Influenza, High Dose Seasonal PF 07/11/2017, 07/16/2019  . Influenza,inj,Quad PF,6+ Mos 08/29/2013  . Influenza-Unspecified 07/30/2014, 07/31/2015, 07/30/2016  . PPD Test 09/17/2011  . Pneumococcal Conjugate-13 11/20/2014  . Pneumococcal Polysaccharide-23 04/23/2010, 04/11/2017  . Tdap 01/23/2014, 06/23/2019  . Zoster  11/28/2015  . Zoster Recombinat (Shingrix) 11/20/2018, 01/21/2019    Screening Tests Health Maintenance  Topic Date Due  . COLONOSCOPY  08/13/2015  . OPHTHALMOLOGY EXAM  02/09/2020  . HEMOGLOBIN A1C   05/02/2020  . FOOT EXAM  10/15/2020  . URINE MICROALBUMIN  11/01/2020  . TETANUS/TDAP  06/22/2029  . INFLUENZA VACCINE  Completed  . Hepatitis C Screening  Completed  . PNA vac Low Risk Adult  Completed     Plan:   ***  I have personally reviewed and noted the following in the patient's chart:   . Medical and social history . Use of alcohol, tobacco or illicit drugs  . Current medications and supplements . Functional ability and status . Nutritional status . Physical activity . Advanced directives . List of other physicians . Hospitalizations, surgeries, and ER visits in previous 12 months . Vitals . Screenings to include cognitive, depression, and falls . Referrals and appointments  In addition, I have reviewed and discussed with patient certain preventive protocols, quality metrics, and best practice recommendations. A written personalized care plan for preventive services as well as general preventive health recommendations were provided to patient.     Naaman Plummer [place], [place]  01/03/2020

## 2020-01-04 ENCOUNTER — Ambulatory Visit: Payer: 59 | Admitting: *Deleted

## 2020-01-04 ENCOUNTER — Other Ambulatory Visit: Payer: Self-pay

## 2020-01-14 ENCOUNTER — Ambulatory Visit (INDEPENDENT_AMBULATORY_CARE_PROVIDER_SITE_OTHER): Payer: 59

## 2020-01-14 DIAGNOSIS — Z95810 Presence of automatic (implantable) cardiac defibrillator: Secondary | ICD-10-CM

## 2020-01-14 DIAGNOSIS — I5022 Chronic systolic (congestive) heart failure: Secondary | ICD-10-CM

## 2020-01-17 NOTE — Progress Notes (Signed)
Virtual Visit via Audio Note  I connected with patient on 01/18/20 at  1:00 PM EST by audio enabled telemedicine application and verified that I am speaking with the correct person using two identifiers.   THIS ENCOUNTER IS A VIRTUAL VISIT DUE TO COVID-19 - PATIENT WAS NOT SEEN IN THE OFFICE. PATIENT HAS CONSENTED TO VIRTUAL VISIT / TELEMEDICINE VISIT   Location of patient: home  Location of provider: office  I discussed the limitations of evaluation and management by telemedicine and the availability of in person appointments. The patient expressed understanding and agreed to proceed.   Subjective:   Mike Jimenez is a 69 y.o. male who presents for Medicare Annual/Subsequent preventive examination.  Pt works at [employer] 40hrs/ wk. Did not like being retired.  Review of Systems: Home Safety/Smoke Alarms: Feels safe in home. Smoke alarms in place.  Lives in split level home w/ wife and 8 dogs.   Male:   CCS- pt states he will schedule when he is ready. PSA-  Lab Results  Component Value Date   PSA 0.72 12/27/2019   PSA 0.84 12/19/2017   PSA 1.11 01/29/2014       Objective:    Vitals: Unable to assess. This visit is enabled though telemedicine due to Covid 19.   Advanced Directives 01/18/2020 01/01/2019 05/21/2016 03/08/2016 09/07/2014 09/05/2014 09/05/2014  Does Patient Have a Medical Advance Directive? No No No No No No No  Would patient like information on creating a medical advance directive? No - Patient declined Yes (MAU/Ambulatory/Procedural Areas - Information given) No - patient declined information No - patient declined information No - patient declined information No - patient declined information No - patient declined information    Tobacco Social History   Tobacco Use  Smoking Status Former Smoker  . Packs/day: 2.50  . Years: 2.00  . Pack years: 5.00  . Types: Cigarettes  . Quit date: 11/29/1970  . Years since quitting: 49.1  Smokeless Tobacco Former Systems developer   . Types: Chew     Counseling given: Not Answered   Clinical Intake: Pain : No/denies pain     Past Medical History:  Diagnosis Date  . Anemia   . Arthritis    "knees" (09/05/2014)  . Automatic implantable cardioverter-defibrillator in situ   . CAD (coronary artery disease)    a. Mild nonobstructive CAD by cath 08/2014.  Marland Kitchen CHF (congestive heart failure) ([place])   . Childhood asthma   . Gastric ulcer 2011  . GERD (gastroesophageal reflux disease)   . History of gout   . Hyperlipidemia   . Hypertension   . LBBB (left bundle branch block)   . NICM (nonischemic cardiomyopathy) ([place])    a. Dx 2011 - presented with sustained VT. Normal coronaries at that time, EF 10-15% by cath and 20-25% by echo. b. s/p biventricular ICD 2011. c. CP 08/2014: mild nonobstructive CAD.  Marland Kitchen Obesity   . Postsurgical hypothyroidism   . Thyroid cancer ([place])    a. 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 Dr Harlow Asa. s/p thyroidectomy 2013.  . Torn meniscus   . Type II diabetes mellitus ([place])   . Ventricular tachycardia ([place])    a. Sustained VT 2011 s/p MDT [id number] Concert BiV ICD, ser# [id number] H.   Past Surgical History:  Procedure Laterality Date  . BI-VENTRICULAR IMPLANTABLE CARDIOVERTER DEFIBRILLATOR  (CRT-D)  03/2010  . CARDIAC CATHETERIZATION  03/2010  . EP IMPLANTABLE DEVICE N/A 05/21/2016   Procedure:  ICD Generator Changeout;  Surgeon:  Evans Lance, MD;  Location: [facility];  Service: Cardiovascular;  Laterality: N/A;  . LEFT HEART CATHETERIZATION WITH CORONARY ANGIOGRAM N/A 09/06/2014   Procedure: LEFT HEART CATHETERIZATION WITH CORONARY ANGIOGRAM;  Surgeon: Burnell Blanks, MD;  Location: [facility];  Service: Cardiovascular;  Laterality: N/A;  . PTX     traumatic, age 26 months  . THYROIDECTOMY  12/10/2011   Procedure: TOTAL THYROIDECTOMY WITH CENTRAL COMPARTMENT DISSECTION;  Surgeon: Earnstine Regal, MD;  Location: [facility];  Service: General;  Laterality: N/A;  Total  thyroidectomy with limited central compartment lymph node dissection.  Marland Kitchen VASECTOMY     Family History  Problem Relation Age of Onset  . Atrial fibrillation Father        diseased at ag 66  . Hypertension Father        alive @ 53.  Marland Kitchen Hypothyroidism Mother   . Hypertension Mother        alive @ 46.  . Diabetes Maternal Grandfather   . Diabetes Maternal Aunt   . Colon cancer Maternal Aunt   . Colon cancer Maternal Aunt   . Lung cancer Paternal Uncle   . Lung cancer Maternal Uncle   . Asthma Sister   . Hypothyroidism Daughter   . Prostate cancer Neg Hx   . CAD Neg Hx    Social History   Socioeconomic History  . Marital status: Married    Spouse name: Not on file  . Number of children: 2  . Years of education: Not on file  . Highest education level: Not on file  Occupational History  . Occupation: [employer]  Tobacco Use  . Smoking status: Former Smoker    Packs/day: 2.50    Years: 2.00    Pack years: 5.00    Types: Cigarettes    Quit date: 11/29/1970    Years since quitting: 49.1  . Smokeless tobacco: Former Systems developer    Types: Chew  Substance and Sexual Activity  . Alcohol use: Yes    Comment: drinks rarely  . Drug use: No  . Sexual activity: Yes  Other Topics Concern  . Not on file  Social History Narrative   Live w/ wife , daughter [name] lives in [place]   8 dogs, fish pond.   Job is  physical.   Does not routinely exercise.   Social Determinants of Health   Financial Resource Strain:   . Difficulty of Paying Living Expenses: Not on file  Food Insecurity:   . Worried About Charity fundraiser in the Last Year: Not on file  . Ran Out of Food in the Last Year: Not on file  Transportation Needs: No Transportation Needs  . Lack of Transportation (Medical): No  . Lack of Transportation (Non-Medical): No  Physical Activity:   . Days of Exercise per Week: Not on file  . Minutes of Exercise per Session: Not on file  Stress:   . Feeling of Stress : Not on file  Social  Connections:   . Frequency of Communication with Friends and Family: Not on file  . Frequency of Social Gatherings with Friends and Family: Not on file  . Attends Religious Services: Not on file  . Active Member of Clubs or Organizations: Not on file  . Attends Archivist Meetings: Not on file  . Marital Status: Not on file    Outpatient Encounter Medications as of 01/18/2020  Medication Sig  . acetaminophen (TYLENOL) 500 MG tablet Take 1,000  mg by mouth every 6 (six) hours as needed (pain).  Marland Kitchen allopurinol (ZYLOPRIM) 100 MG tablet Take 1 tablet (100 mg total) by mouth daily.  Marland Kitchen aspirin EC 81 MG tablet Take 1 tablet (81 mg total) by mouth daily.  Marland Kitchen atorvastatin (LIPITOR) 40 MG tablet Take 1 tablet (40 mg total) by mouth daily.  . carvedilol (COREG) 12.5 MG tablet Take 1 tablet (12.5 mg total) by mouth daily.  . dapagliflozin propanediol (FARXIGA) 5 MG TABS tablet Take 5 mg by mouth daily.  . Dulaglutide (TRULICITY) 1.5 0000000 SOPN Inject into the skin. Take as directed  . furosemide (LASIX) 20 MG tablet Take 1 tablet (20 mg total) by mouth daily.  Marland Kitchen gabapentin (NEURONTIN) 100 MG capsule Take 2-3 capsules (200-300 mg total) by mouth at bedtime.  Marland Kitchen levothyroxine (SYNTHROID) 200 MCG tablet Take 200 mcg by mouth daily before breakfast.  . MAGNESIUM PO Take 1 tablet by mouth daily.  . metFORMIN (GLUCOPHAGE) 1000 MG tablet Take 1 tablet (1,000 mg total) by mouth 2 (two) times daily with a meal.  . Multiple Vitamin (MULTIVITAMIN WITH MINERALS) TABS tablet Take 1 tablet by mouth daily.  . potassium chloride (KLOR-CON M10) 10 MEQ tablet Take 1 tablet (10 mEq total) by mouth daily.  . tadalafil (CIALIS) 20 MG tablet Take 1 tablet (20 mg total) by mouth daily as needed for erectile dysfunction.  . vitamin E 400 UNIT capsule Take 400 Units by mouth daily.   No facility-administered encounter medications on file as of 01/18/2020.    Activities of Daily Living In your present state of  health, do you have any difficulty performing the following activities: 01/18/2020 10/05/2019  Hearing? N N  Vision? N N  Difficulty concentrating or making decisions? N N  Walking or climbing stairs? N N  Dressing or bathing? N N  Doing errands, shopping? N N  Preparing Food and eating ? N -  Using the Toilet? N -  In the past six months, have you accidently leaked urine? N -  Do you have problems with loss of bowel control? N -  Managing your Medications? N -  Managing your Finances? N -  Housekeeping or managing your Housekeeping? N -  Some recent data might be hidden    Patient Care Team: Colon Branch, MD as PCP - General (Internal Medicine) Delrae Rend, MD as Consulting Physician (Endocrinology) Thalia Bloodgood, [place] as Referring Physician (Optometry)   Assessment:   This is a routine wellness examination for Gwen. Physical assessment deferred to PCP.  Exercise Activities and Dietary recommendations Current Exercise Habits: The patient has a physically strenuous job, but has no regular exercise apart from work. Diet (meal preparation, eat out, water intake, caffeinated beverages, dairy products, fruits and vegetables): well balanced, on average, 3 meals per day       Goals    . continue to eat healthy and stay active with job       Fall Risk Fall Risk  01/18/2020 10/05/2019 01/01/2019 12/25/2018 12/19/2017  Falls in the past year? 0 0 0 0 No  Number falls in past yr: 0 - - - -  Injury with Fall? 0 - - - -  Follow up Education provided;Falls prevention discussed Falls evaluation completed - - -   Depression Screen PHQ 2/9 Scores 01/18/2020 10/05/2019 01/01/2019 12/25/2018  PHQ - 2 Score 0 0 0 0  PHQ- 9 Score - - 0 2    Cognitive Function Ad8 score reviewed for issues:  Issues making decisions:no  Less interest in hobbies / activities:no  Repeats questions, stories (family complaining):no  Trouble using ordinary gadgets (microwave, computer, phone):no  Forgets the month  or year: no  Mismanaging finances: no  Remembering appts:no  Daily problems with thinking and/or memory:no Ad8 score is=0   MMSE - Mini Mental State Exam 01/01/2019  Orientation to time 5  Orientation to Place 5  Registration 3  Attention/ Calculation 5  Recall 3  Language- name 2 objects 2  Language- repeat 1  Language- follow 3 step command 3  Language- read & follow direction 1  Write a sentence 1  Copy design 1  Total score 30        Immunization History  Administered Date(s) Administered  . Influenza Split 09/08/2011, 07/20/2018  . Influenza Whole 08/17/2010  . Influenza, High Dose Seasonal PF 07/11/2017, 07/16/2019  . Influenza,inj,Quad PF,6+ Mos 08/29/2013  . Influenza-Unspecified 07/30/2014, 07/31/2015, 07/30/2016  . PPD Test 09/17/2011  . Pneumococcal Conjugate-13 11/20/2014  . Pneumococcal Polysaccharide-23 04/23/2010, 04/11/2017  . Tdap 01/23/2014, 06/23/2019  . Zoster 11/28/2015  . Zoster Recombinat (Shingrix) 11/20/2018, 01/21/2019    Screening Tests Health Maintenance  Topic Date Due  . COLONOSCOPY  04/29/2020 (Originally 08/13/2015)  . OPHTHALMOLOGY EXAM  02/09/2020  . HEMOGLOBIN A1C  05/02/2020  . FOOT EXAM  10/15/2020  . URINE MICROALBUMIN  11/01/2020  . TETANUS/TDAP  06/22/2029  . INFLUENZA VACCINE  Completed  . Hepatitis C Screening  Completed  . PNA vac Low Risk Adult  Completed       Plan:   See you next year!  Keep up the great work!  Please remember to schedule your colonoscopy.  I have personally reviewed and noted the following in the patient's chart:   . Medical and social history . Use of alcohol, tobacco or illicit drugs  . Current medications and supplements . Functional ability and status . Nutritional status . Physical activity . Advanced directives . List of other physicians . Hospitalizations, surgeries, and ER visits in previous 12 months . Vitals . Screenings to include cognitive, depression, and falls .  Referrals and appointments  In addition, I have reviewed and discussed with patient certain preventive protocols, quality metrics, and best practice recommendations. A written personalized care plan for preventive services as well as general preventive health recommendations were provided to patient.     Shela Nevin, [place]  01/18/2020

## 2020-01-18 ENCOUNTER — Telehealth: Payer: Self-pay

## 2020-01-18 ENCOUNTER — Encounter: Payer: Self-pay | Admitting: *Deleted

## 2020-01-18 ENCOUNTER — Ambulatory Visit (INDEPENDENT_AMBULATORY_CARE_PROVIDER_SITE_OTHER): Payer: 59 | Admitting: *Deleted

## 2020-01-18 ENCOUNTER — Other Ambulatory Visit: Payer: Self-pay

## 2020-01-18 DIAGNOSIS — Z0000 Encounter for general adult medical examination without abnormal findings: Secondary | ICD-10-CM

## 2020-01-18 NOTE — Progress Notes (Signed)
EPIC Encounter for ICM Monitoring  Patient Name: Mike Jimenez is a 69 y.o. male Date: 01/18/2020 Primary Care Physican: Colon Branch, MD Primary Cardiologist:Taylor Electrophysiologist: Santina Evans Pacing: 97.9% 11/02/2019 OfficeWeight:257lbs   Attempted call to patient and unable to reach. Left detailed message per DPR regarding transmission. Transmission reviewed.   OptiVolThoracic impedancenormal.  Prescribed: Furosemide 20 mg 1tablet (20 mg total) daily.  Potassium 10 mEq 1tablet (10 mEq total) daily  Labs: 11/02/2019 Creatinine 1.04, BUN 21, Potassium 3.8, Sodium 141, GFR 71.07  Recommendations:Left voice mail with ICM number and encouraged to call if experiencing any fluid symptoms..  Follow-up plan: ICM clinic phone appointment on3/22/2021. 91 day device clinic remote transmission scheduled 12/25/2019. Office visit with Dr Lovena Le on 12/27/2019.  Copy of ICM check sent to Dr.Taylor.  3 month ICM trend: 01/14/2020    1 Year ICM trend:       Rosalene Billings, RN 01/18/2020 9:05 AM

## 2020-01-18 NOTE — Telephone Encounter (Signed)
Remote ICM transmission received.  Attempted call to patient regarding ICM remote transmission and left detailed message per DPR.  Advised to return call for any fluid symptoms or questions. Next ICM remote transmission scheduled 02/18/2020.

## 2020-01-18 NOTE — Patient Instructions (Signed)
See you next year!  Keep up the great work!  Please remember to schedule your colonoscopy.   [name] , Thank you for taking time to come for your Medicare Wellness Visit. I appreciate your ongoing commitment to your health goals. Please review the following plan we discussed and let me know if I can assist you in the future.   These are the goals we discussed: Goals    . continue to eat healthy and stay active with job       This is a list of the screening recommended for you and due dates:  Health Maintenance  Topic Date Due  . Colon Cancer Screening  04/29/2020*  . Eye exam for diabetics  02/09/2020  . Hemoglobin A1C  05/02/2020  . Complete foot exam   10/15/2020  . Urine Protein Check  11/01/2020  . Tetanus Vaccine  06/22/2029  . Flu Shot  Completed  .  Hepatitis C: One time screening is recommended by Center for Disease Control  (CDC) for  adults born from [birth year] through 1964.   Completed  . Pneumonia vaccines  Completed  *Topic was postponed. The date shown is not the original due date.    Preventive Care 34 Years and Older, Male Preventive care refers to lifestyle choices and visits with your health care provider that can promote health and wellness. This includes:  A yearly physical exam. This is also called an annual well check.  Regular dental and eye exams.  Immunizations.  Screening for certain conditions.  Healthy lifestyle choices, such as diet and exercise. What can I expect for my preventive care visit? Physical exam Your health care provider will check:  Height and weight. These may be used to calculate body mass index (BMI), which is a measurement that tells if you are at a healthy weight.  Heart rate and blood pressure.  Your skin for abnormal spots. Counseling Your health care provider may ask you questions about:  Alcohol, tobacco, and drug use.  Emotional well-being.  Home and relationship well-being.  Sexual activity.  Eating  habits.  History of falls.  Memory and ability to understand (cognition).  Work and work Statistician. What immunizations do I need?  Influenza (flu) vaccine  This is recommended every year. Tetanus, diphtheria, and pertussis (Tdap) vaccine  You may need a Td booster every 10 years. Varicella (chickenpox) vaccine  You may need this vaccine if you have not already been vaccinated. Zoster (shingles) vaccine  You may need this after age 4. Pneumococcal conjugate (PCV13) vaccine  One dose is recommended after age 13. Pneumococcal polysaccharide (PPSV23) vaccine  One dose is recommended after age 74. Measles, mumps, and rubella (MMR) vaccine  You may need at least one dose of MMR if you were born in 1957 or later. You may also need a second dose. Meningococcal conjugate (MenACWY) vaccine  You may need this if you have certain conditions. Hepatitis A vaccine  You may need this if you have certain conditions or if you travel or work in places where you may be exposed to hepatitis A. Hepatitis B vaccine  You may need this if you have certain conditions or if you travel or work in places where you may be exposed to hepatitis B. Haemophilus influenzae type b (Hib) vaccine  You may need this if you have certain conditions. You may receive vaccines as individual doses or as more than one vaccine together in one shot (combination vaccines). Talk with your health care provider  about the risks and benefits of combination vaccines. What tests do I need? Blood tests  Lipid and cholesterol levels. These may be checked every 5 years, or more frequently depending on your overall health.  Hepatitis C test.  Hepatitis B test. Screening  Lung cancer screening. You may have this screening every year starting at age 47 if you have a 30-pack-year history of smoking and currently smoke or have quit within the past 15 years.  Colorectal cancer screening. All adults should have this  screening starting at age 81 and continuing until age 13. Your health care provider may recommend screening at age 9 if you are at increased risk. You will have tests every 1-10 years, depending on your results and the type of screening test.  Prostate cancer screening. Recommendations will vary depending on your family history and other risks.  Diabetes screening. This is done by checking your blood sugar (glucose) after you have not eaten for a while (fasting). You may have this done every 1-3 years.  Abdominal aortic aneurysm (AAA) screening. You may need this if you are a current or former smoker.  Sexually transmitted disease (STD) testing. Follow these instructions at home: Eating and drinking  Eat a diet that includes fresh fruits and vegetables, whole grains, lean protein, and low-fat dairy products. Limit your intake of foods with high amounts of sugar, saturated fats, and salt.  Take vitamin and mineral supplements as recommended by your health care provider.  Do not drink alcohol if your health care provider tells you not to drink.  If you drink alcohol: ? Limit how much you have to 0-2 drinks a day. ? Be aware of how much alcohol is in your drink. In the [place] one drink equals one 12 oz bottle of beer (355 mL), one 5 oz glass of wine (148 mL), or one 1 oz glass of hard liquor (44 mL). Lifestyle  Take daily care of your teeth and gums.  Stay active. Exercise for at least 30 minutes on 5 or more days each week.  Do not use any products that contain nicotine or tobacco, such as cigarettes, e-cigarettes, and chewing tobacco. If you need help quitting, ask your health care provider.  If you are sexually active, practice safe sex. Use a condom or other form of protection to prevent STIs (sexually transmitted infections).  Talk with your health care provider about taking a low-dose aspirin or statin. What's next?  Visit your health care provider once a year for a well check  visit.  Ask your health care provider how often you should have your eyes and teeth checked.  Stay up to date on all vaccines. This information is not intended to replace advice given to you by your health care provider. Make sure you discuss any questions you have with your health care provider. Document Revised: 11/09/2018 Document Reviewed: 11/09/2018 Elsevier Patient Education  2020 [employer].

## 2020-02-06 ENCOUNTER — Other Ambulatory Visit: Payer: Self-pay | Admitting: Internal Medicine

## 2020-02-18 ENCOUNTER — Ambulatory Visit (INDEPENDENT_AMBULATORY_CARE_PROVIDER_SITE_OTHER): Payer: 59

## 2020-02-18 DIAGNOSIS — Z95810 Presence of automatic (implantable) cardiac defibrillator: Secondary | ICD-10-CM | POA: Diagnosis not present

## 2020-02-18 DIAGNOSIS — I5022 Chronic systolic (congestive) heart failure: Secondary | ICD-10-CM

## 2020-02-22 NOTE — Progress Notes (Signed)
EPIC Encounter for ICM Monitoring  Patient Name: Mike Jimenez is a 69 y.o. male Date: 02/22/2020 Primary Care Physican: Colon Branch, MD Primary Cardiologist:Taylor Electrophysiologist: Santina Evans Pacing: 97.9% 11/02/2019 OfficeWeight:257lbs   Transmission reviewed.   OptiVolThoracic impedancenormal.  Prescribed: Furosemide 20 mg 1tablet (20 mg total) daily.  Potassium 10 mEq 1tablet (10 mEq total) daily  Labs: 11/02/2019 Creatinine 1.04, BUN 21, Potassium 3.8, Sodium 141, GFR 71.07  Recommendations:None  Follow-up plan: ICM clinic phone appointment on4/28/2021. 91 day device clinic remote transmission scheduled 06/24/2020.   Copy of ICM check sent to Dr.Taylor.  3 month ICM trend: 02/18/2020    1 Year ICM trend:       Rosalene Billings, RN 02/22/2020 10:10 AM

## 2020-02-28 ENCOUNTER — Encounter: Payer: Self-pay | Admitting: Internal Medicine

## 2020-03-25 ENCOUNTER — Ambulatory Visit (INDEPENDENT_AMBULATORY_CARE_PROVIDER_SITE_OTHER): Payer: 59 | Admitting: *Deleted

## 2020-03-25 DIAGNOSIS — I472 Ventricular tachycardia, unspecified: Secondary | ICD-10-CM

## 2020-03-25 LAB — CUP PACEART REMOTE DEVICE CHECK
Battery Remaining Longevity: 35 mo
Battery Voltage: 2.96 V
Brady Statistic AP VP Percent: 0.53 %
Brady Statistic AP VS Percent: 0.02 %
Brady Statistic AS VP Percent: 97.5 %
Brady Statistic AS VS Percent: 1.95 %
Brady Statistic RA Percent Paced: 0.55 %
Brady Statistic RV Percent Paced: 93.41 %
Date Time Interrogation Session: 20210427093424
HighPow Impedance: 44 Ohm
HighPow Impedance: 54 Ohm
Implantable Lead Implant Date: 20110516
Implantable Lead Implant Date: 20110516
Implantable Lead Implant Date: 20110516
Implantable Lead Location: 753858
Implantable Lead Location: 753859
Implantable Lead Location: 753860
Implantable Lead Model: 5076
Implantable Lead Model: 6947
Implantable Pulse Generator Implant Date: 20170623
Lead Channel Impedance Value: 285 Ohm
Lead Channel Impedance Value: 342 Ohm
Lead Channel Impedance Value: 399 Ohm
Lead Channel Impedance Value: 399 Ohm
Lead Channel Impedance Value: 551 Ohm
Lead Channel Impedance Value: 836 Ohm
Lead Channel Pacing Threshold Amplitude: 0.875 V
Lead Channel Pacing Threshold Amplitude: 0.875 V
Lead Channel Pacing Threshold Amplitude: 1.125 V
Lead Channel Pacing Threshold Pulse Width: 0.4 ms
Lead Channel Pacing Threshold Pulse Width: 0.4 ms
Lead Channel Pacing Threshold Pulse Width: 0.4 ms
Lead Channel Sensing Intrinsic Amplitude: 10.5 mV
Lead Channel Sensing Intrinsic Amplitude: 10.5 mV
Lead Channel Sensing Intrinsic Amplitude: 2.625 mV
Lead Channel Sensing Intrinsic Amplitude: 2.625 mV
Lead Channel Setting Pacing Amplitude: 2 V
Lead Channel Setting Pacing Amplitude: 2.5 V
Lead Channel Setting Pacing Amplitude: 2.5 V
Lead Channel Setting Pacing Pulse Width: 0.4 ms
Lead Channel Setting Pacing Pulse Width: 0.4 ms
Lead Channel Setting Sensing Sensitivity: 0.3 mV

## 2020-03-25 NOTE — Progress Notes (Signed)
ICD Remote  

## 2020-03-26 ENCOUNTER — Ambulatory Visit (INDEPENDENT_AMBULATORY_CARE_PROVIDER_SITE_OTHER): Payer: 59

## 2020-03-26 DIAGNOSIS — Z95810 Presence of automatic (implantable) cardiac defibrillator: Secondary | ICD-10-CM

## 2020-03-26 DIAGNOSIS — I5022 Chronic systolic (congestive) heart failure: Secondary | ICD-10-CM

## 2020-03-31 DIAGNOSIS — Z95810 Presence of automatic (implantable) cardiac defibrillator: Secondary | ICD-10-CM

## 2020-03-31 DIAGNOSIS — I5022 Chronic systolic (congestive) heart failure: Secondary | ICD-10-CM | POA: Diagnosis not present

## 2020-03-31 NOTE — Progress Notes (Signed)
EPIC Encounter for ICM Monitoring  Patient Name: Mike Jimenez is a 69 y.o. male Date: 03/31/2020 Primary Care Physican: Colon Branch, MD Primary Cardiologist:Taylor Electrophysiologist: Santina Evans Pacing: 97.5% Last Weight:257lbs  Time in AT/AF 0.0 hr/day (0.0%)   Transmission reviewed.   OptiVolThoracic impedancenormal.  Prescribed: Furosemide 20 mg 1tablet (20 mg total) daily.  Potassium 10 mEq 1tablet (10 mEq total) daily  Labs: 11/02/2019 Creatinine 1.04, BUN21, Potassium3.8, Sodium 141, GFR71.07  Recommendations:None  Follow-up plan: ICM clinic phone appointment on6/11/2019. 91 day device clinic remote transmission scheduled 06/24/2020.   Copy of ICM check sent to Dr.Taylor.  3 month ICM trend: 03/25/2020    1 Year ICM trend:       Rosalene Billings, RN 03/31/2020 4:36 PM

## 2020-04-03 ENCOUNTER — Other Ambulatory Visit: Payer: Self-pay | Admitting: Internal Medicine

## 2020-04-10 LAB — HM DIABETES EYE EXAM

## 2020-04-15 ENCOUNTER — Encounter: Payer: Self-pay | Admitting: Internal Medicine

## 2020-04-29 ENCOUNTER — Telehealth: Payer: Self-pay

## 2020-04-29 ENCOUNTER — Ambulatory Visit: Payer: 59

## 2020-04-29 DIAGNOSIS — I5022 Chronic systolic (congestive) heart failure: Secondary | ICD-10-CM

## 2020-04-29 DIAGNOSIS — Z95810 Presence of automatic (implantable) cardiac defibrillator: Secondary | ICD-10-CM

## 2020-04-29 NOTE — Telephone Encounter (Signed)
Remote ICM transmission received.  Attempted call to patient regarding ICM remote transmission and left detailed message per DPR.  Advised to return call for any fluid symptoms or questions. Next ICM remote transmission scheduled 06/03/2020.

## 2020-04-29 NOTE — Progress Notes (Signed)
EPIC Encounter for ICM Monitoring  Patient Name: Mike Jimenez is a 69 y.o. male Date: 04/29/2020 Primary Care Physican: Colon Branch, MD Primary Cardiologist:Taylor Electrophysiologist: Santina Evans Pacing: 97.9% Last Weight:257lbs  Time in AT/AF  0.0 hr/day (0.0%)   Attempted call to patient and unable to reach.  Left detailed message per DPR regarding transmission. Transmission reviewed.   OptiVolThoracic impedancenormal.  Prescribed: Furosemide 20 mg 1tablet (20 mg total) daily.  Potassium 10 mEq 1tablet (10 mEq total) daily  Labs: 11/02/2019 Creatinine 1.04, BUN21, Potassium3.8, Sodium 141, GFR71.07  Recommendations:Left voice mail with ICM number and encouraged to call if experiencing any fluid symptoms.  Follow-up plan: ICM clinic phone appointment on7/04/2020. 91 day device clinic remote transmission scheduled7/27/2021.   Copy of ICM check sent to Dr.Taylor.  3 month ICM trend: 04/29/2020    1 Year ICM trend:       Rosalene Billings, RN 04/29/2020 3:51 PM

## 2020-06-03 ENCOUNTER — Ambulatory Visit (INDEPENDENT_AMBULATORY_CARE_PROVIDER_SITE_OTHER): Payer: 59

## 2020-06-03 DIAGNOSIS — I5022 Chronic systolic (congestive) heart failure: Secondary | ICD-10-CM

## 2020-06-03 DIAGNOSIS — Z95810 Presence of automatic (implantable) cardiac defibrillator: Secondary | ICD-10-CM

## 2020-06-04 NOTE — Progress Notes (Signed)
EPIC Encounter for ICM Monitoring  Patient Name: Mike Jimenez is a 69 y.o. male Date: 06/04/2020 Primary Care Physican: Colon Branch, MD Primary Cardiologist:Taylor Electrophysiologist: Santina Evans Pacing: 98.3% LastWeight:257lbs  Time in AT/AF0.0 hr/day (0.0%)   Transmission reviewed.   OptiVolThoracic impedancenormal.  Prescribed:  Furosemide 20 mg 1tablet (20 mg total) daily.   Potassium 10 mEq 1tablet (10 mEq total) daily  Labs: 11/02/2019 Creatinine 1.04, BUN21, Potassium3.8, Sodium 141, GFR71.07  Recommendations:None  Follow-up plan: ICM clinic phone appointment on 07/07/2020.   91 day device clinic remote transmission 06/24/2020.    EP/Cardiology Office Visits:  Recall for 12/22/2020 with Dr. Lovena Le.    Copy of ICM check sent to Dr. Lovena Le.   3 month ICM trend: 06/03/2020    1 Year ICM trend:       Rosalene Billings, RN 06/04/2020 10:50 AM

## 2020-06-20 ENCOUNTER — Other Ambulatory Visit: Payer: Self-pay | Admitting: Internal Medicine

## 2020-06-24 ENCOUNTER — Ambulatory Visit (INDEPENDENT_AMBULATORY_CARE_PROVIDER_SITE_OTHER): Payer: 59 | Admitting: *Deleted

## 2020-06-24 DIAGNOSIS — I428 Other cardiomyopathies: Secondary | ICD-10-CM

## 2020-06-24 LAB — CUP PACEART REMOTE DEVICE CHECK
Battery Remaining Longevity: 29 mo
Battery Voltage: 2.95 V
Brady Statistic AP VP Percent: 0.99 %
Brady Statistic AP VS Percent: 0.03 %
Brady Statistic AS VP Percent: 97.53 %
Brady Statistic AS VS Percent: 1.45 %
Brady Statistic RA Percent Paced: 1.02 %
Brady Statistic RV Percent Paced: 94.34 %
Date Time Interrogation Session: 20210727082825
HighPow Impedance: 51 Ohm
HighPow Impedance: 66 Ohm
Implantable Lead Implant Date: 20110516
Implantable Lead Implant Date: 20110516
Implantable Lead Implant Date: 20110516
Implantable Lead Location: 753858
Implantable Lead Location: 753859
Implantable Lead Location: 753860
Implantable Lead Model: 5076
Implantable Lead Model: 6947
Implantable Pulse Generator Implant Date: 20170623
Lead Channel Impedance Value: 285 Ohm
Lead Channel Impedance Value: 342 Ohm
Lead Channel Impedance Value: 399 Ohm
Lead Channel Impedance Value: 437 Ohm
Lead Channel Impedance Value: 608 Ohm
Lead Channel Impedance Value: 931 Ohm
Lead Channel Pacing Threshold Amplitude: 0.75 V
Lead Channel Pacing Threshold Amplitude: 0.875 V
Lead Channel Pacing Threshold Amplitude: 1.25 V
Lead Channel Pacing Threshold Pulse Width: 0.4 ms
Lead Channel Pacing Threshold Pulse Width: 0.4 ms
Lead Channel Pacing Threshold Pulse Width: 0.4 ms
Lead Channel Sensing Intrinsic Amplitude: 3 mV
Lead Channel Sensing Intrinsic Amplitude: 3 mV
Lead Channel Sensing Intrinsic Amplitude: 9 mV
Lead Channel Sensing Intrinsic Amplitude: 9 mV
Lead Channel Setting Pacing Amplitude: 1.75 V
Lead Channel Setting Pacing Amplitude: 2.5 V
Lead Channel Setting Pacing Amplitude: 2.5 V
Lead Channel Setting Pacing Pulse Width: 0.4 ms
Lead Channel Setting Pacing Pulse Width: 0.4 ms
Lead Channel Setting Sensing Sensitivity: 0.3 mV

## 2020-06-26 NOTE — Progress Notes (Signed)
Remote ICD transmission.   

## 2020-06-27 ENCOUNTER — Other Ambulatory Visit: Payer: Self-pay

## 2020-06-27 ENCOUNTER — Encounter: Payer: Self-pay | Admitting: Internal Medicine

## 2020-06-27 ENCOUNTER — Ambulatory Visit: Payer: 59 | Admitting: Internal Medicine

## 2020-06-27 VITALS — BP 147/84 | HR 68 | Temp 97.8°F | Resp 18 | Ht 73.0 in | Wt 260.4 lb

## 2020-06-27 DIAGNOSIS — E890 Postprocedural hypothyroidism: Secondary | ICD-10-CM

## 2020-06-27 DIAGNOSIS — I2583 Coronary atherosclerosis due to lipid rich plaque: Secondary | ICD-10-CM

## 2020-06-27 DIAGNOSIS — I2510 Atherosclerotic heart disease of native coronary artery without angina pectoris: Secondary | ICD-10-CM | POA: Diagnosis not present

## 2020-06-27 DIAGNOSIS — I10 Essential (primary) hypertension: Secondary | ICD-10-CM

## 2020-06-27 DIAGNOSIS — M25539 Pain in unspecified wrist: Secondary | ICD-10-CM

## 2020-06-27 LAB — BASIC METABOLIC PANEL
BUN: 26 mg/dL — ABNORMAL HIGH (ref 6–23)
CO2: 27 mEq/L (ref 19–32)
Calcium: 9.2 mg/dL (ref 8.4–10.5)
Chloride: 102 mEq/L (ref 96–112)
Creatinine, Ser: 1.18 mg/dL (ref 0.40–1.50)
GFR: 61.31 mL/min (ref >60.00)
Glucose, Bld: 135 mg/dL — ABNORMAL HIGH (ref 70–99)
Potassium: 3.7 mEq/L (ref 3.5–5.1)
Sodium: 139 mEq/L (ref 135–145)

## 2020-06-27 LAB — TSH: TSH: 5.23 u[IU]/mL — ABNORMAL HIGH (ref 0.35–4.50)

## 2020-06-27 NOTE — Patient Instructions (Addendum)
Check the  blood pressure  regulalrly BP GOAL is between 110/65 and  135/85. If it is consistently higher or lower, let me know  GO TO THE LAB : Get the blood work     [place], [place] back for a physical exam by 11-2019

## 2020-06-27 NOTE — Progress Notes (Signed)
Subjective:    Patient ID: Mike Jimenez, male    DOB: Jan 13, 1951, 69 y.o.   MRN: [id number]  DOS:  06/27/2020 Type of visit - description: Follow-up Since the last office visit he is doing well. Did complain of a pain at the base of the left thumb despite the fact that he uses a brace at work and even at night. Denies paresthesias. Chart reviewed, saw cardiology.   Review of Systems Denies chest pain or difficulty breathing.  No lower extremity edema No palpitations Past Medical History:  Diagnosis Date  . Anemia   . Arthritis    "knees" (09/05/2014)  . Automatic implantable cardioverter-defibrillator in situ   . CAD (coronary artery disease)    a. Mild nonobstructive CAD by cath 08/2014.  Marland Kitchen CHF (congestive heart failure) ([place])   . Childhood asthma   . Gastric ulcer 2011  . GERD (gastroesophageal reflux disease)   . History of gout   . Hyperlipidemia   . Hypertension   . LBBB (left bundle branch block)   . NICM (nonischemic cardiomyopathy) ([place])    a. Dx 2011 - presented with sustained VT. Normal coronaries at that time, EF 10-15% by cath and 20-25% by echo. b. s/p biventricular ICD 2011. c. CP 08/2014: mild nonobstructive CAD.  Marland Kitchen Obesity   . Postsurgical hypothyroidism   . Thyroid cancer ([place])    a. 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 Dr Harlow Asa. s/p thyroidectomy 2013.  . Torn meniscus   . Type II diabetes mellitus ([place])   . Ventricular tachycardia ([place])    a. Sustained VT 2011 s/p MDT [id number] Concert BiV ICD, ser# [id number].    Past Surgical History:  Procedure Laterality Date  . BI-VENTRICULAR IMPLANTABLE CARDIOVERTER DEFIBRILLATOR  (CRT-D)  03/2010  . CARDIAC CATHETERIZATION  03/2010  . EP IMPLANTABLE DEVICE N/A 05/21/2016   Procedure:  ICD Generator Changeout;  Surgeon: Evans Lance, MD;  Location: [place];  Service: Cardiovascular;  Laterality: N/A;  . LEFT HEART CATHETERIZATION WITH CORONARY ANGIOGRAM N/A 09/06/2014   Procedure: LEFT  HEART CATHETERIZATION WITH CORONARY ANGIOGRAM;  Surgeon: Burnell Blanks, MD;  [facility] CATH LAB;  Service: Cardiovascular;  Laterality: N/A;  . PTX     traumatic, age 63 months  . THYROIDECTOMY  12/10/2011   Procedure: TOTAL THYROIDECTOMY WITH CENTRAL COMPARTMENT DISSECTION;  Surgeon: Earnstine Regal, MD;  Location: [facility];  Service: General;  Laterality: N/A;  Total thyroidectomy with limited central compartment lymph node dissection.  Marland Kitchen VASECTOMY      Allergies as of 06/27/2020      Reactions   Losartan Other (See Comments)   Leg cramps   Ramipril Cough      Medication List       Accurate as of June 27, 2020 11:59 PM. If you have any questions, ask your nurse or doctor.        acetaminophen 500 MG tablet Commonly known as: TYLENOL Take 1,000 mg by mouth every 6 (six) hours as needed (pain).   allopurinol 100 MG tablet Commonly known as: ZYLOPRIM Take 1 tablet (100 mg total) by mouth daily.   aspirin EC 81 MG tablet Take 1 tablet (81 mg total) by mouth daily.   atorvastatin 40 MG tablet Commonly known as: LIPITOR Take 1 tablet (40 mg total) by mouth daily.   carvedilol 12.5 MG tablet Commonly known as: COREG Take 1 tablet (12.5 mg total) by mouth daily.   Farxiga 5 MG Tabs tablet  Generic drug: dapagliflozin propanediol Take 5 mg by mouth daily.   furosemide 20 MG tablet Commonly known as: LASIX Take 1 tablet (20 mg total) by mouth daily.   gabapentin 100 MG capsule Commonly known as: NEURONTIN Take 2-3 capsules (200-300 mg total) by mouth at bedtime.   levothyroxine 200 MCG tablet Commonly known as: SYNTHROID Take 200 mcg by mouth daily before breakfast.   MAGNESIUM PO Take 1 tablet by mouth daily.   metFORMIN 1000 MG tablet Commonly known as: GLUCOPHAGE Take 1 tablet (1,000 mg total) by mouth 2 (two) times daily with a meal.   multivitamin with minerals Tabs tablet Take 1 tablet by mouth daily.   potassium chloride 10 MEQ tablet Commonly  known as: Klor-Con M10 Take 1 tablet (10 mEq total) by mouth daily.   tadalafil 20 MG tablet Commonly known as: CIALIS Take 1 tablet (20 mg total) by mouth daily as needed for erectile dysfunction.   Trulicity 1.5 XK/4.8JE Sopn Generic drug: Dulaglutide Inject into the skin. Take as directed   vitamin E 180 MG (400 UNITS) capsule Take 400 Units by mouth daily.          Objective:   Physical Exam BP (!) 147/84 (BP Location: Left Arm, Patient Position: Sitting, Cuff Size: Normal)   Pulse 68   Temp 97.8 F (36.6 C) (Oral)   Resp 18   Ht 6\' 1"  (1.854 m)   Wt (!) 260 lb 6 oz (118.1 kg)   SpO2 93%   BMI 34.35 kg/m  General:   Well developed, NAD, BMI noted. HEENT:  Normocephalic . Face symmetric, atraumatic Lungs:  CTA B Normal respiratory effort, no intercostal retractions, no accessory muscle use. Heart: RRR,  no murmur.  Lower extremities: no pretibial edema bilaterally MSK: Right hand and wrist normal Left hand and wrist: Slightly puffy at the wrist without clear-cut effusion, redness, warmness.  Motor exam of the upper extremities normal. Skin: Not pale. Not jaundice Neurologic:  alert & oriented X3.  Speech normal, gait appropriate for age and unassisted Psych--  Cognition and judgment appear intact.  Cooperative with normal attention span and concentration.  Behavior appropriate. No anxious or depressed appearing.      Assessment      Assessment DM,  actos intolerant 01-2018 + Mild neuropathy on exam 03/2017, HTN Hyperlipidemia Hypothyroidism h/oThyroid cancer, nodularity, no mets, thyroidectomy 2013- sees Dr Buddy Duty yearly as off 05/2020 CV: --CHF/nonischemic cardiomyopathy dx 2011 --Ventricular tachycardia, 2011,  EF  ~15 -20 % -- s/p  biventricular ICD 2011--- replaced 04-2016 --CAD -Nonobstructive   by cath 08-2014 --LBBB  GI: --GERD --H/o anemia:  --Gastric ulcer per EGD 07-2010 DJD (hip pain, XRs dosne 2017) H/o gout  PLAN  DM: Per Endo, see  them once yearly HTN: On carvedilol, Lasix, potassium.  Ambulatory BPs 130s/80s.  Check a BMP. Hypothyroidism: On Synthroid 200 mcg, last TSH on record slightly suppressed, recheck today. CV: saw cards 11/2019, felt to be stable Wrist DJD: Suspect pain proximal to the left wrist is DJD, he is already using a brace, recommend Voltaren gel, refer to Ortho. Preventive care: Had COVID shots, recommend a flu shot this season RTC CPX 1-20 22  This visit occurred during the SARS-CoV-2 public health emergency.  Safety protocols were in place, including screening questions prior to the visit, additional usage of staff PPE, and extensive cleaning of exam room while observing appropriate contact time as indicated for disinfecting solutions.

## 2020-06-27 NOTE — Progress Notes (Signed)
Pre visit review using our clinic review tool, if applicable. No additional management support is needed unless otherwise documented below in the visit note. 

## 2020-06-28 NOTE — Assessment & Plan Note (Signed)
DM: Per Endo, see them once yearly HTN: On carvedilol, Lasix, potassium.  Ambulatory BPs 130s/80s.  Check a BMP. Hypothyroidism: On Synthroid 200 mcg, last TSH on record slightly suppressed, recheck today. CV: saw cards 11/2019, felt to be stable Wrist DJD: Suspect pain proximal to the left wrist is DJD, he is already using a brace, recommend Voltaren gel, refer to Ortho. Preventive care: Had COVID shots, recommend a flu shot this season RTC CPX 1-20 22

## 2020-07-07 ENCOUNTER — Ambulatory Visit (INDEPENDENT_AMBULATORY_CARE_PROVIDER_SITE_OTHER): Payer: 59

## 2020-07-07 DIAGNOSIS — I5022 Chronic systolic (congestive) heart failure: Secondary | ICD-10-CM

## 2020-07-07 DIAGNOSIS — Z95810 Presence of automatic (implantable) cardiac defibrillator: Secondary | ICD-10-CM | POA: Diagnosis not present

## 2020-07-07 NOTE — Progress Notes (Signed)
EPIC Encounter for ICM Monitoring  Patient Name: Mike Jimenez is a 69 y.o. male Date: 07/07/2020 Primary Care Physican: Colon Branch, MD Primary Cardiologist:Taylor Electrophysiologist: Santina Evans Pacing: 98.3% LastWeight:257lbs  Time in AT/AF0.0 hr/day (0.0%)   Attempted call to patient and unable to reach.  Left detailed message per DPR regarding transmission. Transmission reviewed.   OptiVolThoracic impedancenormal.  Prescribed:  Furosemide 20 mg 1tablet (20 mg total) daily.   Potassium 10 mEq 1tablet (10 mEq total) daily  Labs: 11/02/2019 Creatinine 1.04, BUN21, Potassium3.8, Sodium 141, GFR71.07  Recommendations:Left voice mail with ICM number and encouraged to call if experiencing any fluid symptoms.  Follow-up plan: ICM clinic phone appointment on 08/11/2020.   91 day device clinic remote transmission 09/23/2020.    EP/Cardiology Office Visits:  Recall for 12/22/2020 with Dr. Lovena Le.    Copy of ICM check sent to Dr. Lovena Le.    3 month ICM trend: 07/07/2020    1 Year ICM trend:       Rosalene Billings, RN 07/07/2020 4:17 PM

## 2020-07-08 ENCOUNTER — Telehealth: Payer: Self-pay

## 2020-07-08 NOTE — Telephone Encounter (Signed)
Remote ICM transmission received.  Attempted call to patient regarding ICM remote transmission and left detailed message per DPR.  Advised to return call for any fluid symptoms or questions. Next ICM remote transmission scheduled 08/11/2020.     

## 2020-07-19 ENCOUNTER — Other Ambulatory Visit: Payer: Self-pay | Admitting: Internal Medicine

## 2020-08-11 ENCOUNTER — Ambulatory Visit (INDEPENDENT_AMBULATORY_CARE_PROVIDER_SITE_OTHER): Payer: 59

## 2020-08-11 DIAGNOSIS — I5022 Chronic systolic (congestive) heart failure: Secondary | ICD-10-CM | POA: Diagnosis not present

## 2020-08-11 DIAGNOSIS — Z95810 Presence of automatic (implantable) cardiac defibrillator: Secondary | ICD-10-CM | POA: Diagnosis not present

## 2020-08-15 NOTE — Progress Notes (Signed)
EPIC Encounter for ICM Monitoring  Patient Name: Mike Jimenez is a 69 y.o. male Date: 08/15/2020 Primary Care Physican: Colon Branch, MD Primary Cardiologist:Taylor Electrophysiologist: Santina Evans Pacing: 98.3% LastWeight:257lbs  Time in AT/AF0.0 hr/day (0.0%)   Transmission reviewed.  OptiVolThoracic impedancenormal.  Prescribed:  Furosemide 20 mg 1tablet (20 mg total) daily.   Potassium 10 mEq 1tablet (10 mEq total) daily  Labs: 06/27/2020 Creatinine 1.18, BUN 26, Potassium 3.7, Sodium 139, GFR 61.31 A complete set of results can be found in Results Review.  Recommendations: No changes  Follow-up plan: ICM clinic phone appointment on10/18/2021. 91 day device clinic remote transmission 09/23/2020.   EP/Cardiology Office Visits:Recall for 1/24/2022with Dr. Lovena Le.   Copy of ICM check sent to Dr.Taylor.     3 month ICM trend: 08/11/2020    1 Year ICM trend:       Rosalene Billings, RN 08/15/2020 8:52 AM

## 2020-08-20 ENCOUNTER — Other Ambulatory Visit: Payer: Self-pay | Admitting: Internal Medicine

## 2020-08-23 ENCOUNTER — Other Ambulatory Visit: Payer: Self-pay | Admitting: Internal Medicine

## 2020-08-27 ENCOUNTER — Other Ambulatory Visit: Payer: Self-pay | Admitting: Internal Medicine

## 2020-09-15 ENCOUNTER — Ambulatory Visit (INDEPENDENT_AMBULATORY_CARE_PROVIDER_SITE_OTHER): Payer: 59

## 2020-09-15 DIAGNOSIS — I5022 Chronic systolic (congestive) heart failure: Secondary | ICD-10-CM

## 2020-09-15 DIAGNOSIS — Z95810 Presence of automatic (implantable) cardiac defibrillator: Secondary | ICD-10-CM | POA: Diagnosis not present

## 2020-09-16 NOTE — Progress Notes (Signed)
EPIC Encounter for ICM Monitoring  Patient Name: Mike Jimenez is a 69 y.o. male Date: 09/16/2020 Primary Care Physican: Colon Branch, MD Primary Cardiologist:Taylor Electrophysiologist: Santina Evans Pacing: 98.3% LastWeight:257lbs  Time in AT/AF <0.1 hr/day (<0.1%)   Transmission reviewed.  OptiVolThoracic impedancenormal.  Prescribed:  Furosemide 20 mg 1tablet (20 mg total) daily.   Potassium 10 mEq 1tablet (10 mEq total) daily  Labs: 06/27/2020 Creatinine 1.18, BUN 26, Potassium 3.7, Sodium 139, GFR 61.31 A complete set of results can be found in Results Review.  Recommendations: No changes  Follow-up plan: ICM clinic phone appointment on11/22/2021. 91 day device clinic remote transmission10/26/2021.   EP/Cardiology Office Visits:Recall for 1/24/2022with Dr. Lovena Le.   Copy of ICM check sent to Dr.Taylor.  3 month ICM trend: 09/15/2020    1 Year ICM trend:       Rosalene Billings, RN 09/16/2020 4:39 PM

## 2020-09-20 ENCOUNTER — Other Ambulatory Visit: Payer: Self-pay | Admitting: Internal Medicine

## 2020-09-23 ENCOUNTER — Ambulatory Visit (INDEPENDENT_AMBULATORY_CARE_PROVIDER_SITE_OTHER): Payer: 59

## 2020-09-23 DIAGNOSIS — I472 Ventricular tachycardia, unspecified: Secondary | ICD-10-CM

## 2020-09-23 LAB — CUP PACEART REMOTE DEVICE CHECK
Battery Remaining Longevity: 28 mo
Battery Voltage: 2.95 V
Brady Statistic AP VP Percent: 0.61 %
Brady Statistic AP VS Percent: 0.02 %
Brady Statistic AS VP Percent: 97.9 %
Brady Statistic AS VS Percent: 1.47 %
Brady Statistic RA Percent Paced: 0.63 %
Brady Statistic RV Percent Paced: 91.7 %
Date Time Interrogation Session: 20211026074224
HighPow Impedance: 48 Ohm
HighPow Impedance: 65 Ohm
Implantable Lead Implant Date: 20110516
Implantable Lead Implant Date: 20110516
Implantable Lead Implant Date: 20110516
Implantable Lead Location: 753858
Implantable Lead Location: 753859
Implantable Lead Location: 753860
Implantable Lead Model: 5076
Implantable Lead Model: 6947
Implantable Pulse Generator Implant Date: 20170623
Lead Channel Impedance Value: 266 Ohm
Lead Channel Impedance Value: 323 Ohm
Lead Channel Impedance Value: 399 Ohm
Lead Channel Impedance Value: 437 Ohm
Lead Channel Impedance Value: 551 Ohm
Lead Channel Impedance Value: 874 Ohm
Lead Channel Pacing Threshold Amplitude: 0.75 V
Lead Channel Pacing Threshold Amplitude: 0.875 V
Lead Channel Pacing Threshold Amplitude: 1.25 V
Lead Channel Pacing Threshold Pulse Width: 0.4 ms
Lead Channel Pacing Threshold Pulse Width: 0.4 ms
Lead Channel Pacing Threshold Pulse Width: 0.4 ms
Lead Channel Sensing Intrinsic Amplitude: 2.5 mV
Lead Channel Sensing Intrinsic Amplitude: 2.5 mV
Lead Channel Sensing Intrinsic Amplitude: 9.875 mV
Lead Channel Sensing Intrinsic Amplitude: 9.875 mV
Lead Channel Setting Pacing Amplitude: 2 V
Lead Channel Setting Pacing Amplitude: 2.5 V
Lead Channel Setting Pacing Amplitude: 2.5 V
Lead Channel Setting Pacing Pulse Width: 0.4 ms
Lead Channel Setting Pacing Pulse Width: 0.4 ms
Lead Channel Setting Sensing Sensitivity: 0.3 mV

## 2020-09-25 NOTE — Progress Notes (Signed)
Remote ICD transmission.   

## 2020-10-16 LAB — HEPATIC FUNCTION PANEL
ALT: 19 (ref 10–40)
AST: 18 (ref 14–40)
Alkaline Phosphatase: 92 (ref 25–125)
Bilirubin, Total: 1.2

## 2020-10-16 LAB — COMPREHENSIVE METABOLIC PANEL
Albumin: 4.9 (ref 3.5–5.0)
Calcium: 10 (ref 8.7–10.7)
GFR calc Af Amer: 77
GFR calc non Af Amer: 64

## 2020-10-16 LAB — LIPID PANEL
Cholesterol: 202 — AB (ref 0–200)
HDL: 39 (ref 35–70)
LDL Cholesterol: 113
Triglycerides: 291 — AB (ref 40–160)

## 2020-10-16 LAB — HEMOGLOBIN A1C: Hemoglobin A1C: 8.3

## 2020-10-16 LAB — VITAMIN B12: Vitamin B-12: 616

## 2020-10-16 LAB — BASIC METABOLIC PANEL
BUN: 24 — AB (ref 4–21)
CO2: 31 — AB (ref 13–22)
Chloride: 99 (ref 99–108)
Creatinine: 1.1 (ref 0.6–1.3)
Glucose: 127
Potassium: 4.1 (ref 3.4–5.3)
Sodium: 141 (ref 137–147)

## 2020-10-16 LAB — MICROALBUMIN, URINE: Microalb, Ur: 24.05

## 2020-10-16 LAB — TSH: TSH: 5.4 (ref 0.41–5.90)

## 2020-10-20 ENCOUNTER — Ambulatory Visit (INDEPENDENT_AMBULATORY_CARE_PROVIDER_SITE_OTHER): Payer: 59

## 2020-10-20 DIAGNOSIS — Z95810 Presence of automatic (implantable) cardiac defibrillator: Secondary | ICD-10-CM

## 2020-10-20 DIAGNOSIS — I5022 Chronic systolic (congestive) heart failure: Secondary | ICD-10-CM

## 2020-10-21 NOTE — Progress Notes (Signed)
EPIC Encounter for ICM Monitoring  Patient Name: Mike Jimenez is a 69 y.o. male Date: 10/21/2020 Primary Care Physican: Colon Branch, MD Primary Cardiologist:Taylor Electrophysiologist: Santina Evans Pacing: 98.2% 10/21/2020 Weight:256lbs  Time in AT/AF   0.0 hr/day (0.0%)   Spoke with patient and reports feeling well at this time.  Denies fluid symptoms.  He has some congestion in throat and chest but may be sinus congestion.  OptiVolThoracic impedancenormal.  Prescribed:  Furosemide 20 mg 1tablet (20 mg total) daily.   Potassium 10 mEq 1tablet (10 mEq total) daily  Labs: 06/27/2020 Creatinine1.18, BUN26, Potassium3.7, Sodium139, GFR61.31 A complete set of results can be found in Results Review.  Recommendations:No changes and encouraged to call if experiencing any fluid symptoms.  Follow-up plan: ICM clinic phone appointment on12/27/2021. 91 day device clinic remote transmission1/25/2022.   EP/Cardiology Office Visits:Recall for 1/24/2022with Dr. Lovena Le.   Copy of ICM check sent to Dr.Taylor.  3 month ICM trend: 10/21/2020    1 Year ICM trend:       Rosalene Billings, RN 10/21/2020 11:10 AM

## 2020-11-18 ENCOUNTER — Encounter: Payer: Self-pay | Admitting: Internal Medicine

## 2020-11-19 ENCOUNTER — Encounter: Payer: Self-pay | Admitting: Internal Medicine

## 2020-11-24 ENCOUNTER — Ambulatory Visit (INDEPENDENT_AMBULATORY_CARE_PROVIDER_SITE_OTHER): Payer: 59

## 2020-11-24 DIAGNOSIS — Z95810 Presence of automatic (implantable) cardiac defibrillator: Secondary | ICD-10-CM

## 2020-11-24 DIAGNOSIS — I5022 Chronic systolic (congestive) heart failure: Secondary | ICD-10-CM | POA: Diagnosis not present

## 2020-11-26 NOTE — Progress Notes (Signed)
EPIC Encounter for ICM Monitoring  Patient Name: Mike Jimenez is a 69 y.o. male Date: 11/26/2020 Primary Care Physican: Wanda Plump, MD Primary Cardiologist:Taylor Electrophysiologist: Rae Roam Pacing: 98.2% 10/21/2020 Weight:256lbs  Time in AT/AF0.0 hr/day (0.0%)   Transmission Reviewed.  OptiVolThoracic impedancenormal.  Prescribed:  Furosemide 20 mg 1tablet (20 mg total) daily.   Potassium 10 mEq 1tablet (10 mEq total) daily  Labs: 06/27/2020 Creatinine1.18, BUN26, Potassium3.7, Sodium139, GFR61.31 A complete set of results can be found in Results Review.  Recommendations:No changes.  Follow-up plan: ICM clinic phone appointment on2/05/2021. 91 day device clinic remote transmission1/25/2022.   EP/Cardiology Office Visits:Recall for 1/24/2022with Dr. Ladona Ridgel.   Copy of ICM check sent to Dr.Taylor.  3 month ICM trend: 11/24/2020    1 Year ICM trend:       Karie Soda, RN 11/26/2020 1:10 PM

## 2020-12-03 LAB — TSH: TSH: 0.87 (ref 0.41–5.90)

## 2020-12-09 ENCOUNTER — Encounter: Payer: Self-pay | Admitting: Internal Medicine

## 2020-12-09 MED ORDER — ALLOPURINOL 100 MG PO TABS
100.0000 mg | ORAL_TABLET | Freq: Every day | ORAL | 3 refills | Status: DC
Start: 2020-12-09 — End: 2022-01-04

## 2020-12-23 ENCOUNTER — Other Ambulatory Visit: Payer: Self-pay | Admitting: Internal Medicine

## 2020-12-23 ENCOUNTER — Ambulatory Visit (INDEPENDENT_AMBULATORY_CARE_PROVIDER_SITE_OTHER): Payer: 59

## 2020-12-23 DIAGNOSIS — I5022 Chronic systolic (congestive) heart failure: Secondary | ICD-10-CM | POA: Diagnosis not present

## 2020-12-23 LAB — CUP PACEART REMOTE DEVICE CHECK
Battery Remaining Longevity: 28 mo
Battery Voltage: 2.94 V
Brady Statistic AP VP Percent: 0.45 %
Brady Statistic AP VS Percent: 0.02 %
Brady Statistic AS VP Percent: 98 %
Brady Statistic AS VS Percent: 1.53 %
Brady Statistic RA Percent Paced: 0.47 %
Brady Statistic RV Percent Paced: 93.54 %
Date Time Interrogation Session: 20220125091703
HighPow Impedance: 47 Ohm
HighPow Impedance: 60 Ohm
Implantable Lead Implant Date: 20110516
Implantable Lead Implant Date: 20110516
Implantable Lead Implant Date: 20110516
Implantable Lead Location: 753858
Implantable Lead Location: 753859
Implantable Lead Location: 753860
Implantable Lead Model: 5076
Implantable Lead Model: 6947
Implantable Pulse Generator Implant Date: 20170623
Lead Channel Impedance Value: 285 Ohm
Lead Channel Impedance Value: 342 Ohm
Lead Channel Impedance Value: 399 Ohm
Lead Channel Impedance Value: 437 Ohm
Lead Channel Impedance Value: 570 Ohm
Lead Channel Impedance Value: 874 Ohm
Lead Channel Pacing Threshold Amplitude: 0.75 V
Lead Channel Pacing Threshold Amplitude: 1 V
Lead Channel Pacing Threshold Amplitude: 1.125 V
Lead Channel Pacing Threshold Pulse Width: 0.4 ms
Lead Channel Pacing Threshold Pulse Width: 0.4 ms
Lead Channel Pacing Threshold Pulse Width: 0.4 ms
Lead Channel Sensing Intrinsic Amplitude: 2.75 mV
Lead Channel Sensing Intrinsic Amplitude: 2.75 mV
Lead Channel Sensing Intrinsic Amplitude: 8.375 mV
Lead Channel Sensing Intrinsic Amplitude: 8.375 mV
Lead Channel Setting Pacing Amplitude: 2 V
Lead Channel Setting Pacing Amplitude: 2.25 V
Lead Channel Setting Pacing Amplitude: 2.5 V
Lead Channel Setting Pacing Pulse Width: 0.4 ms
Lead Channel Setting Pacing Pulse Width: 0.4 ms
Lead Channel Setting Sensing Sensitivity: 0.3 mV

## 2020-12-30 ENCOUNTER — Other Ambulatory Visit: Payer: Self-pay

## 2020-12-30 ENCOUNTER — Ambulatory Visit (INDEPENDENT_AMBULATORY_CARE_PROVIDER_SITE_OTHER): Payer: 59 | Admitting: Internal Medicine

## 2020-12-30 ENCOUNTER — Encounter: Payer: Self-pay | Admitting: Internal Medicine

## 2020-12-30 VITALS — BP 122/64 | HR 74 | Temp 98.3°F | Resp 18 | Ht 73.0 in | Wt 256.2 lb

## 2020-12-30 DIAGNOSIS — Z0000 Encounter for general adult medical examination without abnormal findings: Secondary | ICD-10-CM

## 2020-12-30 DIAGNOSIS — E785 Hyperlipidemia, unspecified: Secondary | ICD-10-CM

## 2020-12-30 DIAGNOSIS — E118 Type 2 diabetes mellitus with unspecified complications: Secondary | ICD-10-CM | POA: Diagnosis not present

## 2020-12-30 DIAGNOSIS — Z1211 Encounter for screening for malignant neoplasm of colon: Secondary | ICD-10-CM

## 2020-12-30 DIAGNOSIS — I10 Essential (primary) hypertension: Secondary | ICD-10-CM | POA: Diagnosis not present

## 2020-12-30 LAB — MICROALBUMIN / CREATININE URINE RATIO
Creatinine,U: 101.3 mg/dL
Microalb Creat Ratio: 58.7 mg/g — ABNORMAL HIGH (ref 0.0–30.0)
Microalb, Ur: 59.5 mg/dL — ABNORMAL HIGH (ref 0.0–1.9)

## 2020-12-30 LAB — COMPREHENSIVE METABOLIC PANEL
ALT: 22 U/L (ref 0–53)
AST: 21 U/L (ref 0–37)
Albumin: 4.6 g/dL (ref 3.5–5.2)
Alkaline Phosphatase: 99 U/L (ref 39–117)
BUN: 21 mg/dL (ref 6–23)
CO2: 31 mEq/L (ref 19–32)
Calcium: 9.4 mg/dL (ref 8.4–10.5)
Chloride: 99 mEq/L (ref 96–112)
Creatinine, Ser: 1.22 mg/dL (ref 0.40–1.50)
GFR: 60.74 mL/min (ref >60.00)
Glucose, Bld: 150 mg/dL — ABNORMAL HIGH (ref 70–99)
Potassium: 3.7 mEq/L (ref 3.5–5.1)
Sodium: 139 mEq/L (ref 135–145)
Total Bilirubin: 1 mg/dL (ref 0.2–1.2)
Total Protein: 7.4 g/dL (ref 6.0–8.3)

## 2020-12-30 LAB — LIPID PANEL
Cholesterol: 157 mg/dL (ref 0–200)
HDL: 36.5 mg/dL — ABNORMAL LOW (ref >39.00)
NonHDL: 120.54
Total CHOL/HDL Ratio: 4
Triglycerides: 284 mg/dL — ABNORMAL HIGH (ref 0.0–149.0)
VLDL: 56.8 mg/dL — ABNORMAL HIGH (ref 0.0–40.0)

## 2020-12-30 LAB — LDL CHOLESTEROL, DIRECT: Direct LDL: 84 mg/dL

## 2020-12-30 NOTE — Assessment & Plan Note (Signed)
Here for CPX DM: Per Endo, check micro Mild neuropathy: Exam pinprick examination is normal, he does have symptoms at night, using CBD oil which helps. HTN: BP is very good, continue carvedilol, Lasix, potassium. Hyperlipidemia: On Lipitor, FLP. Hypothyroidism: Per Endo Cardiovascular: Seems to be doing well with no evidence of edema, no symptoms. Gout: Had one episode over the last several months, mild.  Continue allopurinol. RTC 6 months

## 2020-12-30 NOTE — Patient Instructions (Addendum)
Check the  blood pressure weekly BP GOAL is between 110/65 and  135/85. If it is consistently higher or lower, let me know  Please call the gastroenterology office, you are due for a colonoscopy. [phone]  GO TO THE LAB : Get the blood work     [place], [place] back for a checkup in 6 months

## 2020-12-30 NOTE — Assessment & Plan Note (Signed)
-   Td 2015 - Pneumovax 2011-2018, Prevnar 2015 - zostavax2016;s/p shingrix  -COVID VAX x3 -had a flu shot [facility]: Colonoscopy 9- 2011 4 polyps, per GI letter next in 5 years.    Overdue for colonoscopy, GI referral . -Prostate cancer screening: DRE and PSA  normal 2021 -Diet:  and exercise discussed -Labs: CMP, FLP, micro

## 2020-12-30 NOTE — Progress Notes (Signed)
Subjective:    Patient ID: Mike Jimenez, male    DOB: October 19, 1951, 70 y.o.   MRN: [id number]  DOS:  12/30/2020 Type of visit - description: CPX Since the last office visit is doing okay. No new concerns, he does cough occasionally, has a slow urinary stream but no other LUTS   Review of Systems  Other than above, a 14 point review of systems is negative     Past Medical History:  Diagnosis Date  . Anemia   . Arthritis    "knees" (09/05/2014)  . Automatic implantable cardioverter-defibrillator in situ   . CAD (coronary artery disease)    a. Mild nonobstructive CAD by cath 08/2014.  Marland Kitchen CHF (congestive heart failure) ([place])   . Childhood asthma   . Gastric ulcer 2011  . GERD (gastroesophageal reflux disease)   . History of gout   . Hyperlipidemia   . Hypertension   . LBBB (left bundle branch block)   . NICM (nonischemic cardiomyopathy) ([place])    a. Dx 2011 - presented with sustained VT. Normal coronaries at that time, EF 10-15% by cath and 20-25% by echo. b. s/p biventricular ICD 2011. c. CP 08/2014: mild nonobstructive CAD.  Marland Kitchen Obesity   . Postsurgical hypothyroidism   . Thyroid cancer ([place])    a. 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 Dr Harlow Asa. s/p thyroidectomy 2013.  . Torn meniscus   . Type II diabetes mellitus ([place])   . Ventricular tachycardia ([place])    a. Sustained VT 2011 s/p MDT [id number] Concert BiV ICD, ser# [id number].    Past Surgical History:  Procedure Laterality Date  . BI-VENTRICULAR IMPLANTABLE CARDIOVERTER DEFIBRILLATOR  (CRT-D)  03/2010  . CARDIAC CATHETERIZATION  03/2010  . EP IMPLANTABLE DEVICE N/A 05/21/2016   Procedure:  ICD Generator Changeout;  Surgeon: Evans Lance, MD;  Location: [facility];  Service: Cardiovascular;  Laterality: N/A;  . LEFT HEART CATHETERIZATION WITH CORONARY ANGIOGRAM N/A 09/06/2014   Procedure: LEFT HEART CATHETERIZATION WITH CORONARY ANGIOGRAM;  Surgeon: Burnell Blanks, MD;  Location: [facility] CATH LAB;   Service: Cardiovascular;  Laterality: N/A;  . PTX     traumatic, age 41 months  . THYROIDECTOMY  12/10/2011   Procedure: TOTAL THYROIDECTOMY WITH CENTRAL COMPARTMENT DISSECTION;  Surgeon: Earnstine Regal, MD;  Location: [facility];  Service: General;  Laterality: N/A;  Total thyroidectomy with limited central compartment lymph node dissection.  Marland Kitchen VASECTOMY      Allergies as of 12/30/2020      Reactions   Losartan Other (See Comments)   Leg cramps   Ramipril Cough      Medication List       Accurate as of December 30, 2020  6:38 PM. If you have any questions, ask your nurse or doctor.        acetaminophen 500 MG tablet Commonly known as: TYLENOL Take 1,000 mg by mouth every 6 (six) hours as needed (pain).   allopurinol 100 MG tablet Commonly known as: ZYLOPRIM Take 1 tablet (100 mg total) by mouth daily.   aspirin EC 81 MG tablet Take 1 tablet (81 mg total) by mouth daily.   atorvastatin 40 MG tablet Commonly known as: LIPITOR Take 1 tablet (40 mg total) by mouth daily.   carvedilol 12.5 MG tablet Commonly known as: COREG Take 1 tablet (12.5 mg total) by mouth daily.   dapagliflozin propanediol 10 MG Tabs tablet Commonly known as: FARXIGA Take 1 tablet (10 mg total) by mouth  daily before breakfast.   furosemide 20 MG tablet Commonly known as: LASIX Take 1 tablet (20 mg total) by mouth daily.   gabapentin 100 MG capsule Commonly known as: NEURONTIN Take 2-3 capsules (200-300 mg total) by mouth at bedtime.   Klor-Con M10 10 MEQ tablet Generic drug: potassium chloride TAKE 1 TABLET BY MOUTH EVERY DAY   levothyroxine 112 MCG tablet Commonly known as: SYNTHROID Take 2 tablets (224 mcg total) by mouth daily before breakfast.   MAGNESIUM PO Take 1 tablet by mouth daily.   metFORMIN 1000 MG tablet Commonly known as: GLUCOPHAGE Take 1 tablet (1,000 mg total) by mouth 2 (two) times daily with a meal.   multivitamin with minerals Tabs tablet Take 1 tablet by mouth  daily.   tadalafil 20 MG tablet Commonly known as: CIALIS Take 1 tablet (20 mg total) by mouth daily as needed for erectile dysfunction.   Trulicity 1.5 QP/6.1PJ Sopn Generic drug: Dulaglutide Inject into the skin. Take as directed   vitamin E 180 MG (400 UNITS) capsule Take 400 Units by mouth daily.          Objective:   Physical Exam BP 122/64 (BP Location: Left Arm, Patient Position: Sitting, Cuff Size: Normal)   Pulse 74   Temp 98.3 F (36.8 C) (Oral)   Resp 18   Ht 6\' 1"  (1.854 m)   Wt 256 lb 4 oz (116.2 kg)   SpO2 94%   BMI 33.81 kg/m  General: Well developed, NAD, BMI noted Neck: No  thyromegaly  HEENT:  Normocephalic . Face symmetric, atraumatic Lungs:  CTA B Normal respiratory effort, no intercostal retractions, no accessory muscle use. Heart: RRR,  no murmur.  Abdomen:  Not distended, soft, non-tender. No rebound or rigidity.   DM foot exam: No edema, very good pedal pulses bilaterally, pinprick examination normal Skin: Exposed areas without rash. Not pale. Not jaundice Neurologic:  alert & oriented X3.  Speech normal, gait appropriate for age and unassisted Strength symmetric and appropriate for age.  Psych: Cognition and judgment appear intact.  Cooperative with normal attention span and concentration.  Behavior appropriate. No anxious or depressed appearing.     Assessment      Assessment DM,  actos intolerant 01-2018 + Mild neuropathy on exam 03/2017, HTN Hyperlipidemia Hypothyroidism h/oThyroid cancer, nodularity, no mets, thyroidectomy 2013- sees Dr Buddy Duty yearly as off 05/2020 CV: --CHF/nonischemic cardiomyopathy dx 2011 --Ventricular tachycardia, 2011,  EF  ~15 -20 % -- s/p  biventricular ICD 2011--- replaced 04-2016 --CAD -Nonobstructive   by cath 08-2014 --LBBB  GI: --GERD --H/o anemia:  --Gastric ulcer per EGD 07-2010 DJD (hip pain, XRs dosne 2017) H/o gout  PLAN  Here for CPX DM: Per Endo, check micro Mild neuropathy: Exam  pinprick examination is normal, he does have symptoms at night, using CBD oil which helps. HTN: BP is very good, continue carvedilol, Lasix, potassium. Hyperlipidemia: On Lipitor, FLP. Hypothyroidism: Per Endo Cardiovascular: Seems to be doing well with no evidence of edema, no symptoms. Gout: Had one episode over the last several months, mild.  Continue allopurinol. RTC 6 months   This visit occurred during the SARS-CoV-2 public health emergency.  Safety protocols were in place, including screening questions prior to the visit, additional usage of staff PPE, and extensive cleaning of exam room while observing appropriate contact time as indicated for disinfecting solutions.

## 2020-12-30 NOTE — Progress Notes (Signed)
Pre visit review using our clinic review tool, if applicable. No additional management support is needed unless otherwise documented below in the visit note. 

## 2021-01-01 MED ORDER — LOSARTAN POTASSIUM 25 MG PO TABS: 25.0000 mg | ORAL_TABLET | Freq: Every day | ORAL | 1 refills | Status: DC

## 2021-01-01 NOTE — Addendum Note (Signed)
Addended byDamita Dunnings D on: 01/01/2021 08:02 AM   Modules accepted: Orders

## 2021-01-02 ENCOUNTER — Encounter: Payer: Self-pay | Admitting: Internal Medicine

## 2021-01-03 ENCOUNTER — Encounter: Payer: Self-pay | Admitting: Internal Medicine

## 2021-01-03 NOTE — Progress Notes (Signed)
Remote ICD transmission.   

## 2021-01-05 ENCOUNTER — Ambulatory Visit (INDEPENDENT_AMBULATORY_CARE_PROVIDER_SITE_OTHER): Payer: 59

## 2021-01-05 DIAGNOSIS — Z95810 Presence of automatic (implantable) cardiac defibrillator: Secondary | ICD-10-CM

## 2021-01-05 DIAGNOSIS — I5022 Chronic systolic (congestive) heart failure: Secondary | ICD-10-CM | POA: Diagnosis not present

## 2021-01-09 NOTE — Progress Notes (Signed)
EPIC Encounter for ICM Monitoring  Patient Name: Mike Jimenez is a 70 y.o. male Date: 01/09/2021 Primary Care Physican: Colon Branch, MD Primary Cardiologist:Taylor Electrophysiologist: Santina Evans Pacing: 98.4% 11/23/2021Weight:256lbs  Time in AT/AF0.0 hr/day (0.0%)   Transmission Reviewed and results sent via mychart.  OptiVolThoracic impedancenormal but was suggesting possible fluid accumulation from 1/3-1/15, 1/18-1/26 and 2/1-2/4.  Prescribed:  Furosemide 20 mg 1tablet (20 mg total) daily.   Potassium 10 mEq 1tablet (10 mEq total) daily  Labs: 06/27/2020 Creatinine1.18, BUN26, Potassium3.7, Sodium139, GFR61.31 A complete set of results can be found in Results Review.  Recommendations:No changes.  Follow-up plan: ICM clinic phone appointment on3/14/2022. 91 day device clinic remote transmission4/26/2022.   EP/Cardiology Office Visits:Recall for 1/24/2022with Dr. Lovena Le.   Copy of ICM check sent to Dr.Taylor.  3 month ICM trend: 01/05/2021.    1 Year ICM trend:       Rosalene Billings, RN 01/09/2021 11:19 AM

## 2021-01-12 ENCOUNTER — Telehealth: Payer: Self-pay

## 2021-01-12 NOTE — Telephone Encounter (Signed)
Attempted ICM Call to patient regarding my chart message about his increased fluid intake and fluid accumulation on 01/05/2021 remote transmission.

## 2021-01-13 NOTE — Progress Notes (Signed)
Spoke with patient.  Discussed fluid intake and he reports some days he drinks >100-120 oz due to dry mouth.  Reviewed transmission and advised 64 oz is fluid intake recommendation.  Encouraged him to at least limit fluid intake to <80 oz and he agreed to limiting the fluid if possible.

## 2021-01-20 ENCOUNTER — Encounter: Payer: Self-pay | Admitting: Internal Medicine

## 2021-01-21 NOTE — Progress Notes (Signed)
Subjective:   Mike Jimenez is a 70 y.o. male who presents for Medicare Annual/Subsequent preventive examination.  Review of Systems     Cardiac Risk Factors include: advanced age (>23men, >5 women);diabetes mellitus;dyslipidemia;hypertension;obesity (BMI >30kg/m2)     Objective:    Today's Vitals   01/22/21 1447  BP: 134/70  Pulse: 71  Resp: 16  Temp: 97.9 F (36.6 C)  SpO2: 97%  Weight: 258 lb 6.4 oz (117.2 kg)  Height: 6\' 1"  (1.854 m)   Body mass index is 34.09 kg/m.  Advanced Directives 01/22/2021 01/18/2020 01/01/2019 05/21/2016 03/08/2016 09/07/2014 09/05/2014  Does Patient Have a Medical Advance Directive? No No No No No No No  Would patient like information on creating a medical advance directive? No - Patient declined No - Patient declined Yes (MAU/Ambulatory/Procedural Areas - Information given) No - patient declined information No - patient declined information No - patient declined information No - patient declined information    Current Medications (verified) Outpatient Encounter Medications as of 01/22/2021  Medication Sig   acetaminophen (TYLENOL) 500 MG tablet Take 1,000 mg by mouth every 6 (six) hours as needed (pain).   allopurinol (ZYLOPRIM) 100 MG tablet Take 1 tablet (100 mg total) by mouth daily.   aspirin EC 81 MG tablet Take 1 tablet (81 mg total) by mouth daily.   atorvastatin (LIPITOR) 40 MG tablet Take 1 tablet (40 mg total) by mouth daily.   carvedilol (COREG) 12.5 MG tablet Take 1 tablet (12.5 mg total) by mouth daily.   dapagliflozin propanediol (FARXIGA) 10 MG TABS tablet Take 1 tablet (10 mg total) by mouth daily before breakfast.   Dulaglutide (TRULICITY) 1.5 ZO/1.0RU SOPN Inject into the skin. Take as directed   furosemide (LASIX) 20 MG tablet Take 1 tablet (20 mg total) by mouth daily.   gabapentin (NEURONTIN) 100 MG capsule Take 2-3 capsules (200-300 mg total) by mouth at bedtime.   KLOR-CON M10 10 MEQ tablet TAKE 1 TABLET BY MOUTH  EVERY DAY   levothyroxine (SYNTHROID) 112 MCG tablet Take 2 tablets (224 mcg total) by mouth daily before breakfast.   losartan (COZAAR) 25 MG tablet Take 1 tablet (25 mg total) by mouth daily.   MAGNESIUM PO Take 1 tablet by mouth daily.   metFORMIN (GLUCOPHAGE) 1000 MG tablet Take 1 tablet (1,000 mg total) by mouth 2 (two) times daily with a meal.   Multiple Vitamin (MULTIVITAMIN WITH MINERALS) TABS tablet Take 1 tablet by mouth daily.   tadalafil (CIALIS) 20 MG tablet Take 1 tablet (20 mg total) by mouth daily as needed for erectile dysfunction.   vitamin E 400 UNIT capsule Take 400 Units by mouth daily.   No facility-administered encounter medications on file as of 01/22/2021.    Allergies (verified) Losartan and Ramipril   History: Past Medical History:  Diagnosis Date   Anemia    Arthritis    "knees" (09/05/2014)   Automatic implantable cardioverter-defibrillator in situ    CAD (coronary artery disease)    a. Mild nonobstructive CAD by cath 08/2014.   CHF (congestive heart failure) ([place])    Childhood asthma    Gastric ulcer 2011   GERD (gastroesophageal reflux disease)    History of gout    Hyperlipidemia    Hypertension    LBBB (left bundle branch block)    NICM (nonischemic cardiomyopathy) ([place])    a. Dx 2011 - presented with sustained VT. Normal coronaries at that time, EF 10-15% by cath and 20-25% by echo. b.  s/p biventricular ICD 2011. c. CP 08/2014: mild nonobstructive CAD.   Obesity    Postsurgical hypothyroidism    Thyroid cancer ([place])    a. 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 Dr Harlow Asa. s/p thyroidectomy 2013.   Torn meniscus    Type II diabetes mellitus ([place])    Ventricular tachycardia ([place])    a. Sustained VT 2011 s/p MDT [id number] Concert BiV ICD, ser# [id number].   Past Surgical History:  Procedure Laterality Date   BI-VENTRICULAR IMPLANTABLE CARDIOVERTER DEFIBRILLATOR  (CRT-D)  03/2010   CARDIAC CATHETERIZATION   03/2010   EP IMPLANTABLE DEVICE N/A 05/21/2016   Procedure:  ICD Generator Changeout;  Surgeon: Evans Lance, MD;  Location: [facility];  Service: Cardiovascular;  Laterality: N/A;   LEFT HEART CATHETERIZATION WITH CORONARY ANGIOGRAM N/A 09/06/2014   Procedure: LEFT HEART CATHETERIZATION WITH CORONARY ANGIOGRAM;  Surgeon: Burnell Blanks, MD;  Location: [facility] CATH LAB;  Service: Cardiovascular;  Laterality: N/A;   PTX     traumatic, age 34 months   THYROIDECTOMY  12/10/2011   Procedure: TOTAL THYROIDECTOMY WITH CENTRAL COMPARTMENT DISSECTION;  Surgeon: Earnstine Regal, MD;  Location: [facility];  Service: General;  Laterality: N/A;  Total thyroidectomy with limited central compartment lymph node dissection.   VASECTOMY     Family History  Problem Relation Age of Onset   Atrial fibrillation Father        diseased at ag 75   Hypertension Father        alive @ 51.   Hypothyroidism Mother    Hypertension Mother        alive @ 89.   Diabetes Maternal Grandfather    Diabetes Maternal Aunt    Colon cancer Maternal Aunt    Colon cancer Maternal Aunt    Lung cancer Paternal Uncle    Lung cancer Maternal Uncle    Asthma Sister    Hypothyroidism Daughter    Prostate cancer Neg Hx    CAD Neg Hx    Social History   Socioeconomic History   Marital status: Married    Spouse name: Not on file   Number of children: 2   Years of education: Not on file   Highest education level: Not on file  Occupational History   Occupation: [employer]   Occupation: to retire 2023  Tobacco Use   Smoking status: Former Smoker    Packs/day: 2.50    Years: 2.00    Pack years: 5.00    Types: Cigarettes    Quit date: 11/29/1970    Years since quitting: 50.1   Smokeless tobacco: Former Systems developer    Types: Nurse, children's Use: Never used  Substance and Sexual Activity   Alcohol use: Yes    Comment: drinks rarely   Drug use: No   Sexual activity: Yes  Other Topics  Concern   Not on file  Social History Narrative   Live w/ wife , daughter [name] lives in [place]   8 dogs, fish pond.   Job is  physical.   Does not routinely exercise.   Social Determinants of Health   Financial Resource Strain: Low Risk    Difficulty of Paying Living Expenses: Not hard at all  Food Insecurity: No Food Insecurity   Worried About Charity fundraiser in the Last Year: Never true   Ran Out of Food in the Last Year: Never true  Transportation Needs: No Transportation Needs   Lack of  Transportation (Medical): No   Lack of Transportation (Non-Medical): No  Physical Activity: Inactive   Days of Exercise per Week: 0 days   Minutes of Exercise per Session: 0 min  Stress: No Stress Concern Present   Feeling of Stress : Not at all  Social Connections: Moderately Isolated   Frequency of Communication with Friends and Family: More than three times a week   Frequency of Social Gatherings with Friends and Family: More than three times a week   Attends Religious Services: Never   Marine scientist or Organizations: No   Attends Archivist Meetings: Never   Marital Status: Married    Tobacco Counseling Counseling given: Not Answered    Diabetes:  Is the patient diabetic?  Yes  If diabetic, was a CBG obtained today?  No  Did the patient bring in their glucometer from home?  No  How often do you monitor your CBG's? never.   Financial Strains and Diabetes Management:  Are you having any financial strains with the device, your supplies or your medication? No .  Does the patient want to be seen by Chronic Care Management for management of their diabetes?  No  Would the patient like to be referred to a Nutritionist or for Diabetic Management?  No   Diabetic Exams:  Diabetic Eye Exam: Completed 04/10/2020.   Diabetic Foot Exam: Completed 12/30/2020.      Clinical Intake:  Pre-visit preparation completed: Yes  Pain : No/denies pain      Nutritional Status: BMI > 30  Obese Nutritional Risks: None Diabetes: Yes CBG done?: No Did pt. bring in CBG monitor from home?: No  How often do you need to have someone help you when you read instructions, pamphlets, or other written materials from your doctor or pharmacy?: 1 - Never  Interpreter Needed?: No  Information entered by :: Caroleen Hamman LPN   Activities of Daily Living In your present state of health, do you have any difficulty performing the following activities: 01/22/2021  Hearing? N  Vision? N  Difficulty concentrating or making decisions? N  Walking or climbing stairs? N  Dressing or bathing? N  Doing errands, shopping? N  Preparing Food and eating ? N  Using the Toilet? N  In the past six months, have you accidently leaked urine? N  Do you have problems with loss of bowel control? N  Managing your Medications? N  Managing your Finances? N  Housekeeping or managing your Housekeeping? N  Some recent data might be hidden    Patient Care Team: Colon Branch, MD as PCP - General (Internal Medicine) Delrae Rend, MD as Consulting Physician (Endocrinology) Thalia Bloodgood, [place] as Referring Physician (Optometry) Irene Shipper, MD as Consulting Physician (Gastroenterology)  Indicate any recent Medical Services you may have received from other than [facility] providers in the past year (date may be approximate).     Assessment:   This is a routine wellness examination for Mike Jimenez.  Hearing/Vision screen  Hearing Screening   125Hz  250Hz  500Hz  1000Hz  2000Hz  3000Hz  4000Hz  6000Hz  8000Hz   Right ear:           Left ear:           Comments: Mils hearing loss   Vision Screening Comments: Wears glasses Last eye exam 03/2020-[facility]  Dietary issues and exercise activities discussed: Current Exercise Habits: The patient has a physically strenuous job, but has no regular exercise apart from work., Exercise limited by: None identified  Goals     continue to eat healthy  and stay active with job     Patient Stated     Drink more water & less soft drinks      Depression Screen PHQ 2/9 Scores 01/22/2021 12/30/2020 01/18/2020 10/05/2019 01/01/2019 12/25/2018 12/19/2017  PHQ - 2 Score 0 0 0 0 0 0 0  PHQ- 9 Score - - - - 0 2 -    Fall Risk Fall Risk  01/22/2021 12/30/2020 01/18/2020 10/05/2019 01/01/2019  Falls in the past year? 1 1 0 0 0  Number falls in past yr: 0 0 0 - -  Injury with Fall? 0 0 0 - -  Follow up Falls prevention discussed - Education provided;Falls prevention discussed Falls evaluation completed -    FALL RISK PREVENTION PERTAINING TO THE HOME:  Any stairs in or around the home? Yes  If so, are there any without handrails? No  Home free of loose throw rugs in walkways, pet beds, electrical cords, etc? Yes  Adequate lighting in your home to reduce risk of falls? Yes   ASSISTIVE DEVICES UTILIZED TO PREVENT FALLS:  Life alert? No  Use of a cane, walker or w/c? No  Grab bars in the bathroom? No  Shower chair or bench in shower? No  Elevated toilet seat or a handicapped toilet? No   TIMED UP AND GO:  Was the test performed? Yes .  Length of time to ambulate 10 feet: 9 sec.   Gait steady and fast without use of assistive device  Cognitive Function:Normal cognitive status assessed by direct observation by this Nurse Health Advisor. No abnormalities found.   MMSE - Mini Mental State Exam 01/01/2019  Orientation to time 5  Orientation to Place 5  Registration 3  Attention/ Calculation 5  Recall 3  Language- name 2 objects 2  Language- repeat 1  Language- follow 3 step command 3  Language- read & follow direction 1  Write a sentence 1  Copy design 1  Total score 30        Immunizations Immunization History  Administered Date(s) Administered   Influenza Split 09/08/2011, 07/20/2018   Influenza Whole 08/17/2010   Influenza, High Dose Seasonal PF 07/11/2017, 07/16/2019, 08/01/2020   Influenza,inj,Quad PF,6+ Mos 08/29/2013    Influenza-Unspecified 07/30/2014, 07/31/2015, 07/30/2016   PFIZER(Purple Top)SARS-COV-2 Vaccination 02/10/2020, 03/01/2020, 09/13/2020   PPD Test 09/17/2011   Pneumococcal Conjugate-13 11/20/2014   Pneumococcal Polysaccharide-23 04/23/2010, 04/11/2017, 06/23/2020   Tdap 01/23/2014, 06/23/2019   Zoster 11/28/2015   Zoster Recombinat (Shingrix) 11/20/2018, 01/21/2019    TDAP status: Up to date  Flu Vaccine status: Up to date  Pneumococcal vaccine status: Up to date  Covid-19 vaccine status: Completed vaccines  Qualifies for Shingles Vaccine? No   Zostavax completed Yes   Shingrix Completed?: Yes  Screening Tests Health Maintenance  Topic Date Due   COLONOSCOPY (Pts 45-79yrs Insurance coverage will need to be confirmed)  08/13/2015   COVID-19 Vaccine (4 - Booster for Pfizer series) 03/14/2021   OPHTHALMOLOGY EXAM  04/10/2021   HEMOGLOBIN A1C  04/15/2021   FOOT EXAM  12/30/2021   TETANUS/TDAP  06/22/2029   INFLUENZA VACCINE  Completed   Hepatitis C Screening  Completed   PNA vac Low Risk Adult  Completed    Health Maintenance  Health Maintenance Due  Topic Date Due   COLONOSCOPY (Pts 45-62yrs Insurance coverage will need to be confirmed)  08/13/2015    Colorectal cancer screening: Scheduled for 04/10/2021  Lung Cancer Screening: (  Low Dose CT Chest recommended if Age 10-80 years, 30 pack-year currently smoking OR have quit w/in 15years.) does not qualify.     Additional Screening:  Hepatitis C Screening:Completed 11/05/2016  Vision Screening: Recommended annual ophthalmology exams for early detection of glaucoma and other disorders of the eye. Is the patient up to date with their annual eye exam?  Yes  Who is the provider or what is the name of the office in which the patient attends annual eye exams? [facility]   Dental Screening: Recommended annual dental exams for proper oral hygiene  Community Resource Referral / Chronic Care Management: CRR  required this visit?  No   CCM required this visit?  No      Plan:     I have personally reviewed and noted the following in the patients chart:    Medical and social history  Use of alcohol, tobacco or illicit drugs   Current medications and supplements  Functional ability and status  Nutritional status  Physical activity  Advanced directives  List of other physicians  Hospitalizations, surgeries, and ER visits in previous 12 months  Vitals  Screenings to include cognitive, depression, and falls  Referrals and appointments  In addition, I have reviewed and discussed with patient certain preventive protocols, quality metrics, and best practice recommendations. A written personalized care plan for preventive services as well as general preventive health recommendations were provided to patient.   Patient to access avs on mychart  Marta Antu, [place]   [id number]  Nurse Health Advisor  Nurse Notes: None

## 2021-01-22 ENCOUNTER — Ambulatory Visit (INDEPENDENT_AMBULATORY_CARE_PROVIDER_SITE_OTHER): Payer: 59

## 2021-01-22 ENCOUNTER — Other Ambulatory Visit: Payer: Self-pay

## 2021-01-22 VITALS — BP 134/70 | HR 71 | Temp 97.9°F | Resp 16 | Ht 73.0 in | Wt 258.4 lb

## 2021-01-22 DIAGNOSIS — Z0000 Encounter for general adult medical examination without abnormal findings: Secondary | ICD-10-CM

## 2021-01-22 NOTE — Patient Instructions (Signed)
Mike Jimenez , Thank you for taking time to come for your Medicare Wellness Visit. I appreciate your ongoing commitment to your health goals. Please review the following plan we discussed and let me know if I can assist you in the future.   Screening recommendations/referrals: Colonoscopy: Scheduled for 04/10/21 Recommended yearly ophthalmology/optometry visit for glaucoma screening and checkup Recommended yearly dental visit for hygiene and checkup  Vaccinations: Influenza vaccine: Up to date Pneumococcal vaccine: Completed vaccines Tdap vaccine: Up to date-Due-06/22/2029 Shingles vaccine: Completed vaccines Covid-19: Completed vaccines  Advanced directives: Declined information today.  Conditions/risks identified: See problem list  Next appointment: Follow up in one year for your annual wellness visit. 01/28/2022 @ 3:20  Preventive Care 65 Years and Older, Male Preventive care refers to lifestyle choices and visits with your health care provider that can promote health and wellness. What does preventive care include?  A yearly physical exam. This is also called an annual well check.  Dental exams once or twice a year.  Routine eye exams. Ask your health care provider how often you should have your eyes checked.  Personal lifestyle choices, including:  Daily care of your teeth and gums.  Regular physical activity.  Eating a healthy diet.  Avoiding tobacco and drug use.  Limiting alcohol use.  Practicing safe sex.  Taking low doses of aspirin every day.  Taking vitamin and mineral supplements as recommended by your health care provider. What happens during an annual well check? The services and screenings done by your health care provider during your annual well check will depend on your age, overall health, lifestyle risk factors, and family history of disease. Counseling  Your health care provider may ask you questions about your:  Alcohol use.  Tobacco use.  Drug  use.  Emotional well-being.  Home and relationship well-being.  Sexual activity.  Eating habits.  History of falls.  Memory and ability to understand (cognition).  Work and work Statistician. Screening  You may have the following tests or measurements:  Height, weight, and BMI.  Blood pressure.  Lipid and cholesterol levels. These may be checked every 5 years, or more frequently if you are over 73 years old.  Skin check.  Lung cancer screening. You may have this screening every year starting at age 63 if you have a 30-pack-year history of smoking and currently smoke or have quit within the past 15 years.  Fecal occult blood test (FOBT) of the stool. You may have this test every year starting at age 76.  Flexible sigmoidoscopy or colonoscopy. You may have a sigmoidoscopy every 5 years or a colonoscopy every 10 years starting at age 41.  Prostate cancer screening. Recommendations will vary depending on your family history and other risks.  Hepatitis C blood test.  Hepatitis B blood test.  Sexually transmitted disease (STD) testing.  Diabetes screening. This is done by checking your blood sugar (glucose) after you have not eaten for a while (fasting). You may have this done every 1-3 years.  Abdominal aortic aneurysm (AAA) screening. You may need this if you are a current or former smoker.  Osteoporosis. You may be screened starting at age 69 if you are at high risk. Talk with your health care provider about your test results, treatment options, and if necessary, the need for more tests. Vaccines  Your health care provider may recommend certain vaccines, such as:  Influenza vaccine. This is recommended every year.  Tetanus, diphtheria, and acellular pertussis (Tdap, Td) vaccine. You may  need a Td booster every 10 years.  Zoster vaccine. You may need this after age 23.  Pneumococcal 13-valent conjugate (PCV13) vaccine. One dose is recommended after age  10.  Pneumococcal polysaccharide (PPSV23) vaccine. One dose is recommended after age 44. Talk to your health care provider about which screenings and vaccines you need and how often you need them. This information is not intended to replace advice given to you by your health care provider. Make sure you discuss any questions you have with your health care provider. Document Released: 12/12/2015 Document Revised: 08/04/2016 Document Reviewed: 09/16/2015 Elsevier Interactive Patient Education  2017 [employer] Prevention in the Home Falls can cause injuries. They can happen to people of all ages. There are many things you can do to make your home safe and to help prevent falls. What can I do on the outside of my home?  Regularly fix the edges of walkways and driveways and fix any cracks.  Remove anything that might make you trip as you walk through a door, such as a raised step or threshold.  Trim any bushes or trees on the path to your home.  Use bright outdoor lighting.  Clear any walking paths of anything that might make someone trip, such as rocks or tools.  Regularly check to see if handrails are loose or broken. Make sure that both sides of any steps have handrails.  Any raised decks and porches should have guardrails on the edges.  Have any leaves, snow, or ice cleared regularly.  Use sand or salt on walking paths during winter.  Clean up any spills in your garage right away. This includes oil or grease spills. What can I do in the bathroom?  Use night lights.  Install grab bars by the toilet and in the tub and shower. Do not use towel bars as grab bars.  Use non-skid mats or decals in the tub or shower.  If you need to sit down in the shower, use a plastic, non-slip stool.  Keep the floor dry. Clean up any water that spills on the floor as soon as it happens.  Remove soap buildup in the tub or shower regularly.  Attach bath mats securely with double-sided  non-slip rug tape.  Do not have throw rugs and other things on the floor that can make you trip. What can I do in the bedroom?  Use night lights.  Make sure that you have a light by your bed that is easy to reach.  Do not use any sheets or blankets that are too big for your bed. They should not hang down onto the floor.  Have a firm chair that has side arms. You can use this for support while you get dressed.  Do not have throw rugs and other things on the floor that can make you trip. What can I do in the kitchen?  Clean up any spills right away.  Avoid walking on wet floors.  Keep items that you use a lot in easy-to-reach places.  If you need to reach something above you, use a strong step stool that has a grab bar.  Keep electrical cords out of the way.  Do not use floor polish or wax that makes floors slippery. If you must use wax, use non-skid floor wax.  Do not have throw rugs and other things on the floor that can make you trip. What can I do with my stairs?  Do not leave any items on  the stairs.  Make sure that there are handrails on both sides of the stairs and use them. Fix handrails that are broken or loose. Make sure that handrails are as long as the stairways.  Check any carpeting to make sure that it is firmly attached to the stairs. Fix any carpet that is loose or worn.  Avoid having throw rugs at the top or bottom of the stairs. If you do have throw rugs, attach them to the floor with carpet tape.  Make sure that you have a light switch at the top of the stairs and the bottom of the stairs. If you do not have them, ask someone to add them for you. What else can I do to help prevent falls?  Wear shoes that:  Do not have high heels.  Have rubber bottoms.  Are comfortable and fit you well.  Are closed at the toe. Do not wear sandals.  If you use a stepladder:  Make sure that it is fully opened. Do not climb a closed stepladder.  Make sure that both  sides of the stepladder are locked into place.  Ask someone to hold it for you, if possible.  Clearly mark and make sure that you can see:  Any grab bars or handrails.  First and last steps.  Where the edge of each step is.  Use tools that help you move around (mobility aids) if they are needed. These include:  Canes.  Walkers.  Scooters.  Crutches.  Turn on the lights when you go into a dark area. Replace any light bulbs as soon as they burn out.  Set up your furniture so you have a clear path. Avoid moving your furniture around.  If any of your floors are uneven, fix them.  If there are any pets around you, be aware of where they are.  Review your medicines with your doctor. Some medicines can make you feel dizzy. This can increase your chance of falling. Ask your doctor what other things that you can do to help prevent falls. This information is not intended to replace advice given to you by your health care provider. Make sure you discuss any questions you have with your health care provider. Document Released: 09/11/2009 Document Revised: 04/22/2016 Document Reviewed: 12/20/2014 Elsevier Interactive Patient Education  2017 [employer].

## 2021-01-23 ENCOUNTER — Ambulatory Visit: Payer: 59 | Admitting: *Deleted

## 2021-01-24 ENCOUNTER — Other Ambulatory Visit: Payer: Self-pay | Admitting: Internal Medicine

## 2021-02-09 ENCOUNTER — Ambulatory Visit (INDEPENDENT_AMBULATORY_CARE_PROVIDER_SITE_OTHER): Payer: 59

## 2021-02-09 DIAGNOSIS — I5022 Chronic systolic (congestive) heart failure: Secondary | ICD-10-CM

## 2021-02-09 DIAGNOSIS — Z95810 Presence of automatic (implantable) cardiac defibrillator: Secondary | ICD-10-CM

## 2021-02-11 NOTE — Progress Notes (Signed)
EPIC Encounter for ICM Monitoring  Patient Name: Mike Jimenez is a 70 y.o. male Date: 02/11/2021 Primary Care Physican: Colon Branch, MD Primary Cardiologist:Taylor Electrophysiologist: Santina Evans Pacing: 98.3% 3/16/2022Weight:254lbs  Time in AT/AF0.0 hr/day (0.0%)   Spoke with patient and reports feeling well at this time.  Denies fluid symptoms.    OptiVolThoracic impedancenormal.  Prescribed:  Furosemide 20 mg 1tablet (20 mg total) daily.   Potassium 10 mEq 1tablet (10 mEq total) daily  Labs: 01/19/2021 Creatinine 1.22, BUN 21, Potassium 3.7, Sodium 139, GFR 60.74 A complete set of results can be found in Results Review.  Recommendations:No changes and encouraged to call if experiencing any fluid symptoms.  Follow-up plan: ICM clinic phone appointment on4/18/2022. 91 day device clinic remote transmission4/26/2022.   EP/Cardiology Office Visits:Advised to call office to schedule office appointment with Dr Lovena Le.   Recall for 1/24/2022with Dr. Lovena Le.   Copy of ICM check sent to Dr.Taylor.  3 month ICM trend: 02/09/2021.    1 Year ICM trend:       Rosalene Billings, RN 02/11/2021 8:53 AM

## 2021-02-23 ENCOUNTER — Other Ambulatory Visit: Payer: Self-pay | Admitting: Internal Medicine

## 2021-02-23 ENCOUNTER — Other Ambulatory Visit (INDEPENDENT_AMBULATORY_CARE_PROVIDER_SITE_OTHER): Payer: 59

## 2021-02-23 ENCOUNTER — Other Ambulatory Visit: Payer: Self-pay

## 2021-02-23 DIAGNOSIS — I10 Essential (primary) hypertension: Secondary | ICD-10-CM | POA: Diagnosis not present

## 2021-02-23 LAB — BASIC METABOLIC PANEL
BUN: 21 mg/dL (ref 6–23)
CO2: 30 mEq/L (ref 19–32)
Calcium: 9.2 mg/dL (ref 8.4–10.5)
Chloride: 100 mEq/L (ref 96–112)
Creatinine, Ser: 1.27 mg/dL (ref 0.40–1.50)
GFR: 57.82 mL/min — ABNORMAL LOW (ref >60.00)
Glucose, Bld: 224 mg/dL — ABNORMAL HIGH (ref 70–99)
Potassium: 4 mEq/L (ref 3.5–5.1)
Sodium: 140 mEq/L (ref 135–145)

## 2021-02-25 ENCOUNTER — Telehealth: Payer: 59 | Admitting: Physician Assistant

## 2021-02-25 ENCOUNTER — Encounter: Payer: Self-pay | Admitting: Physician Assistant

## 2021-02-25 DIAGNOSIS — U071 COVID-19: Secondary | ICD-10-CM

## 2021-02-25 MED ORDER — BENZONATATE 100 MG PO CAPS: 100.0000 mg | ORAL_CAPSULE | Freq: Three times a day (TID) | ORAL | 0 refills | Status: DC | PRN

## 2021-02-25 MED ORDER — ALBUTEROL SULFATE HFA 108 (90 BASE) MCG/ACT IN AERS: 2.0000 | INHALATION_SPRAY | RESPIRATORY_TRACT | 0 refills | Status: DC | PRN

## 2021-02-25 MED ORDER — AEROCHAMBER PLUS FLO-VU MEDIUM MISC: 1.0000 | Freq: Once | 0 refills | Status: AC

## 2021-02-25 MED ORDER — FLUTICASONE PROPIONATE 50 MCG/ACT NA SUSP: 2.0000 | Freq: Every day | NASAL | 0 refills | Status: DC

## 2021-02-25 MED ORDER — ONDANSETRON 4 MG PO TBDP: ORAL_TABLET | ORAL | 0 refills | Status: DC

## 2021-02-25 NOTE — Patient Instructions (Signed)
  1. COVID-19  - positive test at home  - referral for PCR testing below home care instructions  - symptomatic therapy  - recommended pulse ox for home use  - to ED if symptoms worsen or SPO2 <90%   You have tested positive for COVID-19, meaning that you were infected with the novel coronavirus and could give the virus to others.  It is vitally important that you stay home so you do not spread it to others.      Please continue isolation at home, for at least 10 days since the start of your symptoms and until you have had 24 hours with no fever (without taking a fever reducer) and with improving of symptoms.  If you have no symptoms but tested positive (or all symptoms resolve after 5 days and you have no fever) you can leave your house but continue to wear a mask around others for an additional 5 days. If you have a fever,continue to stay home until you have had 24 hours of no fever. Most cases improve 5-10 days from onset but we have seen a small number of patients who have gotten worse after the 10 days.  Please be sure to watch for worsening symptoms and remain taking the proper precautions.   Go to the nearest hospital ED for assessment if fever/cough/breathlessness are severe or illness seems like a threat to life.    The following symptoms may appear 2-14 days after exposure: . Fever . Cough . Shortness of breath or difficulty breathing . Chills . Repeated shaking with chills . Muscle pain . Headache . Sore throat . New loss of taste or smell . Fatigue . Congestion or runny nose . Nausea or vomiting . Diarrhea  You have been enrolled in [facility] for COVID-19. Daily you will receive a questionnaire within the [facility] website. Our COVID-19 response team will be monitoring your responses daily.  You may also take acetaminophen (Tylenol) as needed for fever.  HOME CARE: . Only take medications as instructed by your medical team. . Drink plenty of fluids and get  plenty of rest. . A steam or ultrasonic humidifier can help if you have congestion.   GET HELP RIGHT AWAY IF YOU HAVE EMERGENCY WARNING SIGNS.  Call 911 or proceed to your closest emergency facility if: . You develop worsening high fever. . Trouble breathing . Bluish lips or face . Persistent pain or pressure in the chest . New confusion . Inability to wake or stay awake . You cough up blood. . Your symptoms become more severe . Inability to hold down food or fluids  This list is not all possible symptoms. Contact your medical provider for any symptoms that are severe or concerning to you.   Testing Information: The COVID-19 Community Testing sites are testing BY APPOINTMENT ONLY.  You can schedule online at [email or web address]  If you do not have access to a smart phone or computer you may call [phone] for an appointment.   Additional testing sites in the Community:  . For [facility] Testing sites in [facility]  FaceUpdate.uy  . For Pop-up testing sites in [place]  BowlDirectory.co.uk  . For [facility] [email or web address]  . For [facility] testing in [place] and [employer] [email or web address]  . For [facility] testing in [facility]   [email or web address]  For  more information about community testing call [phone]

## 2021-02-25 NOTE — Progress Notes (Signed)
[name] are scheduled for a virtual visit with your provider today.    Just as we do with appointments in the office, we must obtain your consent to participate.  Your consent will be active for this visit and any virtual visit you may have with one of our providers in the next 365 days.    If you have a MyChart account, I can also send a copy of this consent to you electronically.  All virtual visits are billed to your insurance company just like a traditional visit in the office.  As this is a virtual visit, video technology does not allow for your provider to perform a traditional examination.  This may limit your provider's ability to fully assess your condition.  If your provider identifies any concerns that need to be evaluated in person or the need to arrange testing such as labs, EKG, etc, we will make arrangements to do so.    Although advances in technology are sophisticated, we cannot ensure that it will always work on either your end or our end.  If the connection with a video visit is poor, we may have to switch to a telephone visit.  With either a video or telephone visit, we are not always able to ensure that we have a secure connection.   I need to obtain your verbal consent now.   Are you willing to proceed with your visit today?   Mike Jimenez has provided verbal consent on 02/25/2021 for a virtual visit (video or telephone).   Mike Butts, PA-C 02/25/2021  11:07 AM   Date:  02/25/2021   ID:  Mike Jimenez, DOB 1951/02/25, MRN [id number]  Patient Location: Home Provider Location: Home Office   Participants: Patient and Provider for Visit and Wrap up  Method of visit: Video  Location of Patient: Home Location of Provider: Home Office Consent was obtain for visit over the video. Services rendered by provider: Visit was performed via video  A video enabled telemedicine application was used and I verified that I am speaking with the correct person using two  identifiers.  PCP:  Colon Branch, MD   Chief Complaint:  COVID +  History of Present Illness:    Mike Jimenez is a 70 y.o. male with history as stated below. Presents video telehealth for an acute care visit  Onset of symptoms was yesterday and symptoms have been persistent and include: fever, congestion, cough, sore throat.  Pt took COVID test this morning and was +.  Low grade fevers <101 F and myalgias.   Denies having fevers, chills, shortness of breath, cough, chest pain, ear pain, sore throat or exposure to covid or other sick contacts.   Modifying factors include: tylenol and ibuprofen without significant improvement  No other aggravating or relieving factors.  No other c/o.  The patient does have symptoms concerning for COVID-19 infection (fever, chills, cough, or new shortness of breath).  Patient has been tested for COVID during this illness and was positive. Pt does need a PCR test for work.  Pt is fully vaccinated and boosted for COVID.  Past Medical, Surgical, Social History, Allergies, and Medications have been Reviewed.  Patient Active Problem List   Diagnosis Date Noted  . DJD (degenerative joint disease) 04/01/2019  . PCP NOTES >>>>>>>>>>>>>>>>>>>>> 03/29/2016  . Gout 12/11/2014  . CAD (coronary artery disease)   . Ventricular tachycardia ([place])   . NICM (nonischemic cardiomyopathy) ([place])   . LBBB (left bundle branch  block)   . Hypertension   . Hyperlipidemia   . Annual physical exam 01/23/2014  . Chronic systolic CHF (congestive heart failure) ([place]) 06/13/2012  . Thyroid cancer, multifocal papillary carcinoma 01/31/2012  . 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 GERD 07/14/2010  . ANEMIA, IRON DEFICIENCY 05/27/2010  . SHOULDER PAIN 05/26/2010  . ASTHMA 04/23/2010  . HOARSENESS, CHRONIC 02/11/2010  . DM II (diabetes mellitus, type II), controlled ([place]) 08/06/2009  . HYPOTHYROIDISM 12/21/2007  . CARPAL TUNNEL SYNDROME  05/02/2007    Social History   Tobacco Use  . Smoking status: Former Smoker    Packs/day: 2.50    Years: 2.00    Pack years: 5.00    Types: Cigarettes    Quit date: 11/29/1970    Years since quitting: 50.2  . Smokeless tobacco: Former Systems developer    Types: Chew  Substance Use Topics  . Alcohol use: Yes    Comment: drinks rarely     Current Outpatient Medications:  .  albuterol (VENTOLIN HFA) 108 (90 Base) MCG/ACT inhaler, Inhale 2 puffs into the lungs every 2 (two) hours as needed for wheezing or shortness of breath (cough)., Disp: 8 g, Rfl: 0 .  benzonatate (TESSALON PERLES) 100 MG capsule, Take 1 capsule (100 mg total) by mouth 3 (three) times daily as needed for cough (cough)., Disp: 20 capsule, Rfl: 0 .  fluticasone (FLONASE) 50 MCG/ACT nasal spray, Place 2 sprays into both nostrils daily., Disp: 9.9 g, Rfl: 0 .  ondansetron (ZOFRAN ODT) 4 MG disintegrating tablet, 4mg  ODT q4 hours prn nausea/vomit, Disp: 10 tablet, Rfl: 0 .  Spacer/Aero-Holding Chambers (AEROCHAMBER PLUS FLO-VU MEDIUM) MISC, 1 each by Other route once for 1 dose., Disp: 1 each, Rfl: 0 .  acetaminophen (TYLENOL) 500 MG tablet, Take 1,000 mg by mouth every 6 (six) hours as needed (pain)., Disp: , Rfl:  .  allopurinol (ZYLOPRIM) 100 MG tablet, Take 1 tablet (100 mg total) by mouth daily., Disp: 90 tablet, Rfl: 3 .  aspirin EC 81 MG tablet, Take 1 tablet (81 mg total) by mouth daily., Disp: , Rfl:  .  atorvastatin (LIPITOR) 40 MG tablet, Take 1 tablet (40 mg total) by mouth daily., Disp: 90 tablet, Rfl: 1 .  carvedilol (COREG) 12.5 MG tablet, Take 1 tablet (12.5 mg total) by mouth daily., Disp: 90 tablet, Rfl: 1 .  dapagliflozin propanediol (FARXIGA) 10 MG TABS tablet, Take 1 tablet (10 mg total) by mouth daily before breakfast., Disp: , Rfl:  .  Dulaglutide (TRULICITY) 1.5 WP/8.0DX SOPN, Inject into the skin. Take as directed, Disp: , Rfl:  .  furosemide (LASIX) 20 MG tablet, Take 1 tablet (20 mg total) by mouth daily.,  Disp: 30 tablet, Rfl: 3 .  gabapentin (NEURONTIN) 100 MG capsule, Take 2-3 capsules (200-300 mg total) by mouth at bedtime., Disp: 90 capsule, Rfl: 1 .  KLOR-CON M10 10 MEQ tablet, TAKE 1 TABLET BY MOUTH EVERY DAY, Disp: 30 tablet, Rfl: 5 .  levothyroxine (SYNTHROID) 112 MCG tablet, Take 2 tablets (224 mcg total) by mouth daily before breakfast., Disp: , Rfl:  .  losartan (COZAAR) 25 MG tablet, Take 1 tablet (25 mg total) by mouth daily., Disp: 90 tablet, Rfl: 1 .  MAGNESIUM PO, Take 1 tablet by mouth daily., Disp: , Rfl:  .  metFORMIN (GLUCOPHAGE) 1000 MG tablet, Take 1 tablet (1,000 mg total) by mouth 2 (two) times daily with a meal., Disp: 180 tablet, Rfl: 1 .  Multiple Vitamin (MULTIVITAMIN WITH MINERALS)  TABS tablet, Take 1 tablet by mouth daily., Disp: , Rfl:  .  tadalafil (CIALIS) 20 MG tablet, Take 1 tablet (20 mg total) by mouth daily as needed for erectile dysfunction., Disp: 10 tablet, Rfl: 5 .  vitamin E 400 UNIT capsule, Take 400 Units by mouth daily., Disp: , Rfl:    Allergies  Allergen Reactions  . Losartan Other (See Comments)    Leg cramps  . Ramipril Cough     Review of Systems  Constitutional: Positive for chills, fever and malaise/fatigue.  HENT: Positive for congestion and sore throat.   Eyes: Negative for blurred vision.  Respiratory: Positive for cough.   Cardiovascular: Negative for chest pain and leg swelling.  Gastrointestinal: Negative for abdominal pain, diarrhea and vomiting.  Genitourinary: Negative for dysuria.  Musculoskeletal: Positive for myalgias.  Neurological: Negative for headaches.  Psychiatric/Behavioral: The patient is not nervous/anxious.    See HPI for history of present illness.  Physical Exam Constitutional:      General: He is not in acute distress.    Appearance: Normal appearance. He is well-developed. He is not ill-appearing.  HENT:     Head: Normocephalic and atraumatic.     Nose: Congestion present.  Eyes:     General: No  scleral icterus.    Conjunctiva/sclera: Conjunctivae normal.  Pulmonary:     Effort: Pulmonary effort is normal.     Comments: Congested cough, no audible wheezing Musculoskeletal:        General: Normal range of motion.     Cervical back: Normal range of motion.  Skin:    Coloration: Skin is not pale.  Neurological:     General: No focal deficit present.     Mental Status: He is alert.  Psychiatric:        Mood and Affect: Mood normal.               A&P  1. COVID-19  - positive test at home  - symptomatic therapy  - recommended pulse ox for home use  - to ED if symptoms worsen or SPO2 <90%   Patient voiced understanding and agreement to plan.   Time:   Today, I have spent 15 minutes with the patient with telehealth technology discussing the above problems, reviewing the chart, previous notes, medications and orders.    Tests Ordered: No orders of the defined types were placed in this encounter.   Medication Changes: Meds ordered this encounter  Medications  . benzonatate (TESSALON PERLES) 100 MG capsule    Sig: Take 1 capsule (100 mg total) by mouth 3 (three) times daily as needed for cough (cough).    Dispense:  20 capsule    Refill:  0  . albuterol (VENTOLIN HFA) 108 (90 Base) MCG/ACT inhaler    Sig: Inhale 2 puffs into the lungs every 2 (two) hours as needed for wheezing or shortness of breath (cough).    Dispense:  8 g    Refill:  0  . Spacer/Aero-Holding Chambers (AEROCHAMBER PLUS FLO-VU MEDIUM) MISC    Sig: 1 each by Other route once for 1 dose.    Dispense:  1 each    Refill:  0  . fluticasone (FLONASE) 50 MCG/ACT nasal spray    Sig: Place 2 sprays into both nostrils daily.    Dispense:  9.9 g    Refill:  0  . ondansetron (ZOFRAN ODT) 4 MG disintegrating tablet    Sig: 4mg  ODT q4 hours prn nausea/vomit  Dispense:  10 tablet    Refill:  0     Disposition:  Follow up with PCP as needed; to ED for worsening symptoms  Signed, Mike Butts, PA-C  02/25/2021 11:19 AM

## 2021-02-26 ENCOUNTER — Encounter: Payer: Self-pay | Admitting: Internal Medicine

## 2021-02-27 ENCOUNTER — Telehealth: Payer: Self-pay | Admitting: Internal Medicine

## 2021-02-27 ENCOUNTER — Other Ambulatory Visit: Payer: Self-pay | Admitting: Internal Medicine

## 2021-02-27 MED ORDER — AZITHROMYCIN 250 MG PO TABS: ORAL_TABLET | ORAL | 0 refills | Status: DC

## 2021-02-27 NOTE — Telephone Encounter (Signed)
Spoke w/ Pt- informed of recommendations, Pt verbalized understanding. Pt states he is feeling much better today than yesterday. [facility] [place]

## 2021-02-27 NOTE — Telephone Encounter (Signed)
Patient was diagnosed with COVID, symptoms onset 02/24/2021, was seen virtually at another location, was Rx conservative treatment, he is triple vaccinated against COVID. Patient sent a message reporting hemoptysis particularly after albuterol. Call patient: He could be developing bronchitis, send a Z-Pak. Take Mucinex DM or Robitussin DM regularly until better If severe fever, chest pain, difficulty breathing, increasing blood in the sputum or feeling worse: ER Does not necessarily need to use albuterol every 4 or 6 hours, use it as needed only for wheezing. Schedule visit for next week if needed.

## 2021-03-10 ENCOUNTER — Telehealth: Payer: Self-pay

## 2021-03-10 ENCOUNTER — Encounter: Payer: Self-pay | Admitting: Internal Medicine

## 2021-03-10 ENCOUNTER — Telehealth: Payer: Self-pay | Admitting: *Deleted

## 2021-03-10 NOTE — Telephone Encounter (Signed)
Dr Henrene Pastor,  Mike Jimenez is scheduled with you for a recall screening colon 5-13. Last colon 2011 TA polyps x 2. In reviewing his chart, he has an EF of 20-25 % via ECHO 2015 and Cardiac Cath EF 10-15 % 08/2014- he has an AICD in place -  I have not been able to locate another ECHO in his Epic chart .  He has a hx of Ventricular Tachycardia, Gastric ulcer, GERD ,DM,   Asthma, thyroid cancer, LBBB and Covid 02-24-2021.  Do you want him to have an OV or direct at [facility]?  Please advise and thank you so much for your time,Marie

## 2021-03-10 NOTE — Telephone Encounter (Signed)
Pt returned your call and was informed about the need of cardiology evaluation and clearance per Dr. Henrene Pastor. He is aware that colon and pv appt were cancelled. Pt voiced understanding and did not have any further questions.

## 2021-03-10 NOTE — Telephone Encounter (Signed)
I have canceled PV 4-29 and colon 5-13-  Attempted to call pt about needing cardiac eval and clearance prior to a colon- no answer=  LMTRC to me - marie PV

## 2021-03-10 NOTE — Telephone Encounter (Signed)
He needs cardiology evaluation and clearance before considering colonoscopy.  Thanks

## 2021-03-10 NOTE — Telephone Encounter (Signed)
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 He can be reached at [phone]. I told him the nurse will give him a call back.

## 2021-03-11 NOTE — Telephone Encounter (Signed)
LMOM to call DC to get info on magnets and work. DC # provided. E-mail sent to [employer] rep to determine specific restrictions.d

## 2021-03-11 NOTE — Telephone Encounter (Signed)
Pt returning nurse call

## 2021-03-11 NOTE — Telephone Encounter (Signed)
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 Safety department will contact him with information on the mats and a contact prior to installation. Once info on magnets and date of installation is determined he will need to contact the device clinic. [employer] rep will need to review info on magnet to give safety recommendations concerning  CRT-D. Patient will be having colonoscopy and informed that device clinic will provide directions for his device management during his procedure.

## 2021-03-16 ENCOUNTER — Ambulatory Visit (INDEPENDENT_AMBULATORY_CARE_PROVIDER_SITE_OTHER): Payer: 59

## 2021-03-16 DIAGNOSIS — I5022 Chronic systolic (congestive) heart failure: Secondary | ICD-10-CM | POA: Diagnosis not present

## 2021-03-16 DIAGNOSIS — Z95810 Presence of automatic (implantable) cardiac defibrillator: Secondary | ICD-10-CM

## 2021-03-18 ENCOUNTER — Telehealth: Payer: Self-pay

## 2021-03-18 NOTE — Telephone Encounter (Signed)
Remote ICM transmission received.  Attempted call to patient regarding ICM remote transmission and left detailed message per DPR.  Advised to return call for any fluid symptoms or questions.  

## 2021-03-18 NOTE — Progress Notes (Signed)
EPIC Encounter for ICM Monitoring  Patient Name: Mike Jimenez is a 70 y.o. male Date: 03/18/2021 Primary Care Physican: Colon Branch, MD Primary Cardiologist:Taylor Electrophysiologist: Santina Evans Pacing: 98.1% 3/16/2022Weight:254lbs  Time in AT/AF0.0 hr/day (0.0%)   Attempted call to patient and unable to reach.  Left detailed message per DPR regarding transmission. Transmission reviewed.   OptiVolThoracic impedancenormal but was suggesting some days with possible fluid accumulation in the last month.  Prescribed:  Furosemide 20 mg 1tablet (20 mg total) daily.   Potassium 10 mEq 1tablet (10 mEq total) daily  Labs: 01/19/2021 Creatinine 1.22, BUN 21, Potassium 3.7, Sodium 139, GFR 60.74 A complete set of results can be found in Results Review.  Recommendations:Left voice mail with ICM number and encouraged to call if experiencing any fluid symptoms.  Follow-up plan: ICM clinic phone appointment on5/31/2022. 91 day device clinic remote transmission4/26/2022.   EP/Cardiology Office Visits:Pt aware to call office to schedule office appointment with Dr Lovena Le.   Recall for 1/24/2022with Dr. Lovena Le.   Copy of ICM check sent to Dr.Taylor.   3 month ICM trend: 03/16/2021.    1 Year ICM trend:       Rosalene Billings, RN 03/18/2021 9:24 AM

## 2021-03-24 ENCOUNTER — Ambulatory Visit (INDEPENDENT_AMBULATORY_CARE_PROVIDER_SITE_OTHER): Payer: 59

## 2021-03-24 DIAGNOSIS — I428 Other cardiomyopathies: Secondary | ICD-10-CM

## 2021-03-24 LAB — CUP PACEART REMOTE DEVICE CHECK
Battery Remaining Longevity: 25 mo
Battery Voltage: 2.94 V
Brady Statistic AP VP Percent: 0.08 %
Brady Statistic AP VS Percent: 0.02 %
Brady Statistic AS VP Percent: 98.37 %
Brady Statistic AS VS Percent: 1.53 %
Brady Statistic RA Percent Paced: 0.1 %
Brady Statistic RV Percent Paced: 93.57 %
Date Time Interrogation Session: 20220426102305
HighPow Impedance: 42 Ohm
HighPow Impedance: 52 Ohm
Implantable Lead Implant Date: 20110516
Implantable Lead Implant Date: 20110516
Implantable Lead Implant Date: 20110516
Implantable Lead Location: 753858
Implantable Lead Location: 753859
Implantable Lead Location: 753860
Implantable Lead Model: 5076
Implantable Lead Model: 6947
Implantable Pulse Generator Implant Date: 20170623
Lead Channel Impedance Value: 266 Ohm
Lead Channel Impedance Value: 323 Ohm
Lead Channel Impedance Value: 399 Ohm
Lead Channel Impedance Value: 399 Ohm
Lead Channel Impedance Value: 551 Ohm
Lead Channel Impedance Value: 836 Ohm
Lead Channel Pacing Threshold Amplitude: 1 V
Lead Channel Pacing Threshold Amplitude: 1 V
Lead Channel Pacing Threshold Amplitude: 1.375 V
Lead Channel Pacing Threshold Pulse Width: 0.4 ms
Lead Channel Pacing Threshold Pulse Width: 0.4 ms
Lead Channel Pacing Threshold Pulse Width: 0.4 ms
Lead Channel Sensing Intrinsic Amplitude: 2.5 mV
Lead Channel Sensing Intrinsic Amplitude: 2.5 mV
Lead Channel Sensing Intrinsic Amplitude: 9.125 mV
Lead Channel Sensing Intrinsic Amplitude: 9.125 mV
Lead Channel Setting Pacing Amplitude: 2 V
Lead Channel Setting Pacing Amplitude: 2.5 V
Lead Channel Setting Pacing Amplitude: 2.75 V
Lead Channel Setting Pacing Pulse Width: 0.4 ms
Lead Channel Setting Pacing Pulse Width: 0.4 ms
Lead Channel Setting Sensing Sensitivity: 0.3 mV

## 2021-04-02 ENCOUNTER — Other Ambulatory Visit: Payer: Self-pay | Admitting: Internal Medicine

## 2021-04-10 ENCOUNTER — Encounter: Payer: 59 | Admitting: Internal Medicine

## 2021-04-13 NOTE — Progress Notes (Signed)
Remote ICD transmission.   

## 2021-04-28 ENCOUNTER — Ambulatory Visit (INDEPENDENT_AMBULATORY_CARE_PROVIDER_SITE_OTHER): Payer: 59

## 2021-04-28 DIAGNOSIS — I5022 Chronic systolic (congestive) heart failure: Secondary | ICD-10-CM

## 2021-04-28 DIAGNOSIS — Z95810 Presence of automatic (implantable) cardiac defibrillator: Secondary | ICD-10-CM | POA: Diagnosis not present

## 2021-04-29 ENCOUNTER — Ambulatory Visit: Payer: 59 | Admitting: Internal Medicine

## 2021-04-29 ENCOUNTER — Other Ambulatory Visit: Payer: Self-pay

## 2021-04-29 VITALS — BP 119/78 | HR 72 | Temp 97.0°F | Ht 73.0 in | Wt 253.0 lb

## 2021-04-29 DIAGNOSIS — E039 Hypothyroidism, unspecified: Secondary | ICD-10-CM

## 2021-04-29 DIAGNOSIS — E118 Type 2 diabetes mellitus with unspecified complications: Secondary | ICD-10-CM | POA: Diagnosis not present

## 2021-04-29 DIAGNOSIS — I5022 Chronic systolic (congestive) heart failure: Secondary | ICD-10-CM

## 2021-04-29 LAB — CBC WITH DIFFERENTIAL/PLATELET
Basophils Absolute: 0 10*3/uL (ref 0.0–0.1)
Basophils Relative: 0.7 % (ref 0.0–3.0)
Eosinophils Absolute: 0.3 10*3/uL (ref 0.0–0.7)
Eosinophils Relative: 5.3 % — ABNORMAL HIGH (ref 0.0–5.0)
HCT: 40.6 % (ref 39.0–52.0)
Hemoglobin: 13.8 g/dL (ref 13.0–17.0)
Lymphocytes Relative: 26.4 % (ref 12.0–46.0)
Lymphs Abs: 1.5 10*3/uL (ref 0.7–4.0)
MCHC: 33.9 g/dL (ref 30.0–36.0)
MCV: 88.4 fl (ref 78.0–100.0)
Monocytes Absolute: 0.6 10*3/uL (ref 0.1–1.0)
Monocytes Relative: 10.5 % (ref 3.0–12.0)
Neutro Abs: 3.2 10*3/uL (ref 1.4–7.7)
Neutrophils Relative %: 57.1 % (ref 43.0–77.0)
Platelets: 265 10*3/uL (ref 150.0–400.0)
RBC: 4.59 Mil/uL (ref 4.22–5.81)
RDW: 13.9 % (ref 11.5–15.5)
WBC: 5.5 10*3/uL (ref 4.0–10.5)

## 2021-04-29 LAB — HEMOGLOBIN A1C: Hgb A1c MFr Bld: 9.1 % — ABNORMAL HIGH (ref 4.6–6.5)

## 2021-04-29 LAB — TSH: TSH: 0.11 u[IU]/mL — ABNORMAL LOW (ref 0.35–4.50)

## 2021-04-29 MED ORDER — GABAPENTIN 300 MG PO CAPS: 300.0000 mg | ORAL_CAPSULE | Freq: Every day | ORAL | 2 refills | Status: DC

## 2021-04-29 NOTE — Progress Notes (Signed)
EPIC Encounter for ICM Monitoring  Patient Name: Mike Jimenez is a 70 y.o. male Date: 04/29/2021 Primary Care Physican: Colon Branch, MD Primary Cardiologist:Taylor Electrophysiologist: Santina Evans Pacing: 97.9% 3/16/2022Weight:254lbs  Time in AT/AF0.0 hr/day (0.0%)   Transmission reviewed and results sent via mychart.   OptiVolThoracic impedancesuggesting normal fluid levels.  Prescribed:  Furosemide 20 mg 1tablet (20 mg total) daily.   Potassium 10 mEq 1tablet (10 mEq total) daily  Labs: 01/19/2021 Creatinine 1.22, BUN 21, Potassium 3.7, Sodium 139, GFR 60.74 A complete set of results can be found in Results Review.  Recommendations:No changes.  Follow-up plan: ICM clinic phone appointment on7/09/2021. 91 day device clinic remote transmission7/26/2022.   EP/Cardiology Office Visits:Pt awaretocall office to schedule office appointment with Dr Lovena Le.Recall for 1/24/2022with Dr. Lovena Le.   Copy of ICM check sent to Dr.Taylor.   3 month ICM trend: 04/28/2021.    1 Year ICM trend:       Rosalene Billings, RN 04/29/2021 4:59 PM

## 2021-04-29 NOTE — Patient Instructions (Addendum)
  GO TO THE LAB : Get the blood work     [place], PLEASE SCHEDULE YOUR APPOINTMENTS Come back for a physical exam by 12-2021.  Please call sooner if needed.

## 2021-04-29 NOTE — Progress Notes (Signed)
Subjective:    Patient ID: [name], male    DOB: September 13, 1951, 70 y.o.   MRN: [id number]  DOS:  04/29/2021 Type of visit - description: ROV Since the last office visit he had COVID, he feels fully recuperated. Today we talked about neuropathy, diabetes, high blood pressure, and thyroid disease   Review of Systems Denies chest pain or difficulty breathing. No edema. He still has neuropathy described as cramps, mostly at night.  Past Medical History:  Diagnosis Date  . Anemia   . Arthritis    "knees" (09/05/2014)  . Automatic implantable cardioverter-defibrillator in situ   . CAD (coronary artery disease)    a. Mild nonobstructive CAD by cath 08/2014.  Marland Kitchen CHF (congestive heart failure) ([place])   . Childhood asthma   . Gastric ulcer 2011  . GERD (gastroesophageal reflux disease)   . History of gout   . Hyperlipidemia   . Hypertension   . LBBB (left bundle branch block)   . NICM (nonischemic cardiomyopathy) ([place])    a. Dx 2011 - presented with sustained VT. Normal coronaries at that time, EF 10-15% by cath and 20-25% by echo. b. s/p biventricular ICD 2011. c. CP 08/2014: mild nonobstructive CAD.  Marland Kitchen Obesity   . Postsurgical hypothyroidism   . Thyroid cancer ([place])    a. 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 Dr Harlow Asa. s/p thyroidectomy 2013.  . Torn meniscus   . Type II diabetes mellitus ([place])   . Ventricular tachycardia ([place])    a. Sustained VT 2011 s/p MDT [id number] Concert BiV ICD, ser# [id number] H.    Past Surgical History:  Procedure Laterality Date  . BI-VENTRICULAR IMPLANTABLE CARDIOVERTER DEFIBRILLATOR  (CRT-D)  03/2010  . CARDIAC CATHETERIZATION  03/2010  . EP IMPLANTABLE DEVICE N/A 05/21/2016   Procedure:  ICD Generator Changeout;  Surgeon: Evans Lance, MD;  Location: [facility];  Service: Cardiovascular;  Laterality: N/A;  . LEFT HEART CATHETERIZATION WITH CORONARY ANGIOGRAM N/A 09/06/2014   Procedure: LEFT HEART CATHETERIZATION WITH CORONARY  ANGIOGRAM;  Surgeon: Burnell Blanks, MD;  Location: [facility] CATH LAB;  Service: Cardiovascular;  Laterality: N/A;  . PTX     traumatic, age 88 months  . THYROIDECTOMY  12/10/2011   Procedure: TOTAL THYROIDECTOMY WITH CENTRAL COMPARTMENT DISSECTION;  Surgeon: Earnstine Regal, MD;  Location: [place];  Service: General;  Laterality: N/A;  Total thyroidectomy with limited central compartment lymph node dissection.  Marland Kitchen VASECTOMY      Allergies as of 04/29/2021      Reactions   Losartan Other (See Comments)   Leg cramps   Ramipril Cough      Medication List       Accurate as of April 29, 2021 11:59 PM. If you have any questions, ask your nurse or doctor.        STOP taking these medications   azithromycin 250 MG tablet Commonly known as: ZITHROMAX Stopped by: Kathlene November, MD   benzonatate 100 MG capsule Commonly known as: Best boy Stopped by: Kathlene November, MD   ondansetron 4 MG disintegrating tablet Commonly known as: Zofran ODT Stopped by: Kathlene November, MD     TAKE these medications   acetaminophen 500 MG tablet Commonly known as: TYLENOL Take 1,000 mg by mouth every 6 (six) hours as needed (pain).   albuterol 108 (90 Base) MCG/ACT inhaler Commonly known as: VENTOLIN HFA Inhale 2 puffs into the lungs every 2 (two) hours as needed for wheezing or shortness of  breath (cough).   allopurinol 100 MG tablet Commonly known as: ZYLOPRIM Take 1 tablet (100 mg total) by mouth daily.   aspirin EC 81 MG tablet Take 1 tablet (81 mg total) by mouth daily.   atorvastatin 40 MG tablet Commonly known as: LIPITOR Take 1 tablet (40 mg total) by mouth daily.   carvedilol 12.5 MG tablet Commonly known as: COREG Take 1 tablet (12.5 mg total) by mouth daily.   dapagliflozin propanediol 10 MG Tabs tablet Commonly known as: FARXIGA Take 1 tablet (10 mg total) by mouth daily before breakfast.   fluticasone 50 MCG/ACT nasal spray Commonly known as: FLONASE Place 2 sprays into both nostrils  daily.   furosemide 20 MG tablet Commonly known as: LASIX Take 1 tablet (20 mg total) by mouth daily.   gabapentin 300 MG capsule Commonly known as: NEURONTIN Take 1 capsule (300 mg total) by mouth at bedtime. What changed:   medication strength  how much to take Changed by: Kathlene November, MD   levothyroxine 112 MCG tablet Commonly known as: SYNTHROID Take 2 tablets (224 mcg total) by mouth daily before breakfast.   losartan 25 MG tablet Commonly known as: COZAAR Take 1 tablet (25 mg total) by mouth daily.   MAGNESIUM PO Take 1 tablet by mouth daily.   metFORMIN 1000 MG tablet Commonly known as: GLUCOPHAGE Take 1 tablet (1,000 mg total) by mouth 2 (two) times daily with a meal.   multivitamin with minerals Tabs tablet Take 1 tablet by mouth daily.   potassium chloride 10 MEQ tablet Commonly known as: Klor-Con M10 Take 1 tablet (10 mEq total) by mouth daily.   tadalafil 20 MG tablet Commonly known as: CIALIS Take 1 tablet (20 mg total) by mouth daily as needed for erectile dysfunction.   Trulicity 1.5 BM/8.4XL Sopn Generic drug: Dulaglutide Inject into the skin. Take as directed   vitamin E 180 MG (400 UNITS) capsule Take 400 Units by mouth daily.          Objective:   Physical Exam BP 119/78 (BP Location: Left Arm, Patient Position: Sitting, Cuff Size: Large)   Pulse 72   Temp (!) 97 F (36.1 C) (Temporal)   Ht 6\' 1"  (1.854 m)   Wt 253 lb (114.8 kg)   SpO2 95%   BMI 33.38 kg/m  General:   Well developed, NAD, BMI noted. HEENT:  Normocephalic . Face symmetric, atraumatic Lungs:  CTA B Normal respiratory effort, no intercostal retractions, no accessory muscle use. Heart: RRR,  no murmur.  Lower extremities: no pretibial edema bilaterally  Skin: Not pale. Not jaundice Neurologic:  alert & oriented X3.  Speech normal, gait appropriate for age and unassisted Psych--  Cognition and judgment appear intact.  Cooperative with normal attention span and  concentration.  Behavior appropriate. No anxious or depressed appearing.      Assessment    Assessment DM,  actos intolerant 01-2018, sees Dr Buddy Duty + Mild neuropathy on exam 03/2017, HTN Hyperlipidemia Hypothyroidism h/oThyroid cancer, nodularity, no mets, thyroidectomy 2013- sees Dr Buddy Duty yearly as off 05/2020 CV: --CHF/nonischemic cardiomyopathy dx 2011 --Ventricular tachycardia, 2011,  EF  ~15 -20 % -- s/p  biventricular ICD 2011--- replaced 04-2016 --CAD -Nonobstructive   by cath 08-2014 --LBBB  GI: --GERD --H/o anemia:  --Gastric ulcer per EGD 07-2010 DJD (hip pain, XRs dosne 2017) H/o gout covid infex 01/2021   PLAN  DM: On Farxiga, Trulicity, metformin.  Last A1c elevated Per chart review, next visit with Endo is November  2022.  We will check an A1c, if elevated I definitely recommend to see Endo before November 2022. + Microalbuminuria noted at the last visit, started losartan, follow-up BMP was very good. HTN: Well-controlled on carvedilol, Lasix, losartan, potassium.  Ambulatory BPs reportedly 130/80 or less. Hypothyroidism: Per Endo, check TSH. CHF, CAD: No symptoms. Had COVID-14 February 2021, he feels fully recuperated Preventive care: In need of a colonoscopy, pt plans to have cardiology clear him. RTC 12/2021, sooner if needed  This visit occurred during the SARS-CoV-2 public health emergency.  Safety protocols were in place, including screening questions prior to the visit, additional usage of staff PPE, and extensive cleaning of exam room while observing appropriate contact time as indicated for disinfecting solutions.

## 2021-04-30 NOTE — Assessment & Plan Note (Signed)
DM: On Farxiga, Trulicity, metformin.  Last A1c elevated Per chart review, next visit with Endo is November 2022.  We will check an A1c, if elevated I definitely recommend to see Endo before November 2022. + Microalbuminuria noted at the last visit, started losartan, follow-up BMP was very good. HTN: Well-controlled on carvedilol, Lasix, losartan, potassium.  Ambulatory BPs reportedly 130/80 or less. Hypothyroidism: Per Endo, check TSH. CHF, CAD: No symptoms. Had COVID-14 February 2021, he feels fully recuperated Preventive care: In need of a colonoscopy, pt plans to have cardiology clear him. RTC 12/2021, sooner if needed

## 2021-05-04 ENCOUNTER — Encounter: Payer: Self-pay | Admitting: Internal Medicine

## 2021-05-06 ENCOUNTER — Encounter: Payer: Self-pay | Admitting: Internal Medicine

## 2021-05-07 ENCOUNTER — Other Ambulatory Visit: Payer: Self-pay | Admitting: Internal Medicine

## 2021-05-08 LAB — HM DIABETES FOOT EXAM: HM Diabetic Foot Exam: NORMAL

## 2021-05-15 ENCOUNTER — Emergency Department (HOSPITAL_COMMUNITY): Payer: 59

## 2021-05-15 ENCOUNTER — Encounter (HOSPITAL_COMMUNITY): Payer: Self-pay | Admitting: Emergency Medicine

## 2021-05-15 ENCOUNTER — Emergency Department (HOSPITAL_COMMUNITY)
Admission: EM | Admit: 2021-05-15 | Discharge: 2021-05-16 | Disposition: A | Discharge status: Home / Self Care | Payer: 59 | Attending: Emergency Medicine | Admitting: Emergency Medicine

## 2021-05-15 ENCOUNTER — Other Ambulatory Visit: Payer: Self-pay

## 2021-05-15 DIAGNOSIS — Z20822 Contact with and (suspected) exposure to covid-19: Secondary | ICD-10-CM | POA: Diagnosis not present

## 2021-05-15 DIAGNOSIS — I2510 Atherosclerotic heart disease of native coronary artery without angina pectoris: Secondary | ICD-10-CM | POA: Insufficient documentation

## 2021-05-15 DIAGNOSIS — Z87891 Personal history of nicotine dependence: Secondary | ICD-10-CM | POA: Insufficient documentation

## 2021-05-15 DIAGNOSIS — I5042 Chronic combined systolic (congestive) and diastolic (congestive) heart failure: Secondary | ICD-10-CM | POA: Insufficient documentation

## 2021-05-15 DIAGNOSIS — Z79899 Other long term (current) drug therapy: Secondary | ICD-10-CM | POA: Insufficient documentation

## 2021-05-15 DIAGNOSIS — R0902 Hypoxemia: Secondary | ICD-10-CM | POA: Insufficient documentation

## 2021-05-15 DIAGNOSIS — Z7984 Long term (current) use of oral hypoglycemic drugs: Secondary | ICD-10-CM | POA: Diagnosis not present

## 2021-05-15 DIAGNOSIS — E039 Hypothyroidism, unspecified: Secondary | ICD-10-CM | POA: Diagnosis not present

## 2021-05-15 DIAGNOSIS — Z7951 Long term (current) use of inhaled steroids: Secondary | ICD-10-CM | POA: Insufficient documentation

## 2021-05-15 DIAGNOSIS — R509 Fever, unspecified: Secondary | ICD-10-CM | POA: Diagnosis present

## 2021-05-15 DIAGNOSIS — I110 Hypertensive heart disease with heart failure: Secondary | ICD-10-CM | POA: Diagnosis not present

## 2021-05-15 DIAGNOSIS — Z794 Long term (current) use of insulin: Secondary | ICD-10-CM | POA: Diagnosis not present

## 2021-05-15 DIAGNOSIS — J111 Influenza due to unidentified influenza virus with other respiratory manifestations: Secondary | ICD-10-CM | POA: Diagnosis not present

## 2021-05-15 DIAGNOSIS — Z7982 Long term (current) use of aspirin: Secondary | ICD-10-CM | POA: Insufficient documentation

## 2021-05-15 DIAGNOSIS — E119 Type 2 diabetes mellitus without complications: Secondary | ICD-10-CM | POA: Diagnosis not present

## 2021-05-15 DIAGNOSIS — J45909 Unspecified asthma, uncomplicated: Secondary | ICD-10-CM | POA: Diagnosis not present

## 2021-05-15 LAB — COMPREHENSIVE METABOLIC PANEL
ALT: 17 U/L (ref 0–44)
AST: 36 U/L (ref 15–41)
Albumin: 3.8 g/dL (ref 3.5–5.0)
Alkaline Phosphatase: 68 U/L (ref 38–126)
Anion gap: 8 (ref 5–15)
BUN: 16 mg/dL (ref 8–23)
CO2: 27 mmol/L (ref 22–32)
Calcium: 8.2 mg/dL — ABNORMAL LOW (ref 8.9–10.3)
Chloride: 101 mmol/L (ref 98–111)
Creatinine, Ser: 1.08 mg/dL (ref 0.61–1.24)
GFR, Estimated: >60 mL/min (ref >60)
Glucose, Bld: 136 mg/dL — ABNORMAL HIGH (ref 70–99)
Potassium: 4.6 mmol/L (ref 3.5–5.1)
Sodium: 136 mmol/L (ref 135–145)
Total Bilirubin: 1.3 mg/dL — ABNORMAL HIGH (ref 0.3–1.2)
Total Protein: 6.9 g/dL (ref 6.5–8.1)

## 2021-05-15 LAB — TROPONIN I (HIGH SENSITIVITY)
Troponin I (High Sensitivity): 17 ng/L (ref <18)
Troponin I (High Sensitivity): 19 ng/L — ABNORMAL HIGH (ref <18)

## 2021-05-15 LAB — CBC WITH DIFFERENTIAL/PLATELET
Abs Immature Granulocytes: 0.03 10*3/uL (ref 0.00–0.07)
Basophils Absolute: 0 10*3/uL (ref 0.0–0.1)
Basophils Relative: 0 %
Eosinophils Absolute: 0 10*3/uL (ref 0.0–0.5)
Eosinophils Relative: 0 %
HCT: 39.7 % (ref 39.0–52.0)
Hemoglobin: 13.3 g/dL (ref 13.0–17.0)
Immature Granulocytes: 0 %
Lymphocytes Relative: 6 %
Lymphs Abs: 0.4 10*3/uL — ABNORMAL LOW (ref 0.7–4.0)
MCH: 30.3 pg (ref 26.0–34.0)
MCHC: 33.5 g/dL (ref 30.0–36.0)
MCV: 90.4 fL (ref 80.0–100.0)
Monocytes Absolute: 0.9 10*3/uL (ref 0.1–1.0)
Monocytes Relative: 12 %
Neutro Abs: 5.6 10*3/uL (ref 1.7–7.7)
Neutrophils Relative %: 82 %
Platelets: 207 10*3/uL (ref 150–400)
RBC: 4.39 MIL/uL (ref 4.22–5.81)
RDW: 13.2 % (ref 11.5–15.5)
WBC: 6.9 10*3/uL (ref 4.0–10.5)
nRBC: 0 % (ref 0.0–0.2)

## 2021-05-15 LAB — RESP PANEL BY RT-PCR (FLU A&B, COVID) ARPGX2
Influenza A by PCR: POSITIVE — AB
Influenza B by PCR: NEGATIVE
SARS Coronavirus 2 by RT PCR: NEGATIVE

## 2021-05-15 LAB — LACTIC ACID, PLASMA: Lactic Acid, Venous: 1 mmol/L (ref 0.5–1.9)

## 2021-05-15 LAB — BRAIN NATRIURETIC PEPTIDE: B Natriuretic Peptide: 184.7 pg/mL — ABNORMAL HIGH (ref 0.0–100.0)

## 2021-05-15 MED ORDER — ACETAMINOPHEN 325 MG PO TABS
650.0000 mg | ORAL_TABLET | Freq: Once | ORAL | Status: AC
  Administered 2021-05-15: 650 mg via ORAL
  Filled 2021-05-15: qty 2

## 2021-05-15 NOTE — ED Triage Notes (Signed)
Pt reports productive cough with brown/yellow mucous, fever, diarrhea and hypoxia over the past week. Pt reports SpO2 as 82% yesterday. Pt 92% in triage. Pt reports 4/10 chest pain/congestion. Pt reports taking a home test yesterday that resulted negative yesterday.

## 2021-05-15 NOTE — ED Provider Notes (Signed)
Emergency Medicine Provider Triage Evaluation Note  Mike Jimenez , a 70 y.o. male  was evaluated in triage.  Pt complains of productive cough, fever, diarrhea and hypoxia.  He had a home covid test that was negative.  He is vaccinated and boosted.  He denies any known covid contacts. . Review of Systems  Positive: Fever, cough, fever Negative: vomiting  Physical Exam  BP (!) 173/79   Pulse 85   Temp (!) 101.5 F (38.6 C) (Oral)   Resp (!) 24   Ht 6\' 1"  (1.854 m)   Wt 113.4 kg   SpO2 94%   BMI 32.98 kg/m  Gen:   Awake, no distress   Resp:  Normal effort  MSK:   Moves extremities without difficulty  Other:  Lungs CTAB  Medical Decision Making  Medically screening exam initiated at 2:48 PM.  Appropriate orders placed.  Mike Jimenez was informed that the remainder of the evaluation will be completed by another provider, this initial triage assessment does not replace that evaluation, and the importance of remaining in the ED until their evaluation is complete.     Lorin Glass, PA-C 05/15/21 1450    Mike Johns, MD 05/15/21 [phone]

## 2021-05-16 ENCOUNTER — Encounter: Payer: Self-pay | Admitting: Internal Medicine

## 2021-05-16 LAB — TROPONIN I (HIGH SENSITIVITY): Troponin I (High Sensitivity): 15 ng/L (ref <18)

## 2021-05-16 MED ORDER — OSELTAMIVIR PHOSPHATE 75 MG PO CAPS: 75.0000 mg | ORAL_CAPSULE | Freq: Two times a day (BID) | ORAL | 0 refills | Status: DC

## 2021-05-16 MED ORDER — ACETAMINOPHEN 500 MG PO TABS
1000.0000 mg | ORAL_TABLET | Freq: Once | ORAL | Status: AC
  Administered 2021-05-16: 1000 mg via ORAL
  Filled 2021-05-16: qty 2

## 2021-05-16 MED ORDER — OSELTAMIVIR PHOSPHATE 75 MG PO CAPS
75.0000 mg | ORAL_CAPSULE | Freq: Once | ORAL | Status: AC
  Administered 2021-05-16: 75 mg via ORAL
  Filled 2021-05-16: qty 1

## 2021-05-16 NOTE — ED Provider Notes (Signed)
[facility] EMERGENCY DEPARTMENT Provider Note  CSN: [id number] Arrival date & time: 05/15/21 1331  Chief Complaint(s) Fever and Hypoxia  HPI Mike Jimenez is a 70 y.o. male with a past medical history listed below including chronic diastolic heart failure with ICD/pacemaker in place here for fever.  Patient reports productive cough for 1 week with associated diarrhea.  Has been short of breath for the past couple days.  Reports that home pulse ox was down in the 80s.  Patient has not on any oxygen at home.  Also endorsing chest congestion and pain for 2 days.  Pain is constant and worse with coughing.  Pain is nonradiating and nonexertional.  No known sick contacts.   Fever  Past Medical History Past Medical History:  Diagnosis Date   Anemia    Arthritis    "knees" (09/05/2014)   Automatic implantable cardioverter-defibrillator in situ    CAD (coronary artery disease)    a. Mild nonobstructive CAD by cath 08/2014.   CHF (congestive heart failure) ([place])    Childhood asthma    Gastric ulcer 2011   GERD (gastroesophageal reflux disease)    History of gout    Hyperlipidemia    Hypertension    LBBB (left bundle branch block)    NICM (nonischemic cardiomyopathy) ([place])    a. Dx 2011 - presented with sustained VT. Normal coronaries at that time, EF 10-15% by cath and 20-25% by echo. b. s/p biventricular ICD 2011. c. CP 08/2014: mild nonobstructive CAD.   Obesity    Postsurgical hypothyroidism    Thyroid cancer ([place])    a. 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 Dr Harlow Asa. s/p thyroidectomy 2013.   Torn meniscus    Type II diabetes mellitus ([place])    Ventricular tachycardia ([place])    a. Sustained VT 2011 s/p MDT [id number] Concert BiV ICD, ser# [id number].   Patient Active Problem List   Diagnosis Date Noted   DJD (degenerative joint disease) 04/01/2019   PCP NOTES >>>>>>>>>>>>>>>>>>>>> 03/29/2016   Gout 12/11/2014   CAD (coronary artery disease)     Ventricular tachycardia (HCC)    NICM (nonischemic cardiomyopathy) (HCC)    LBBB (left bundle branch block)    Hypertension    Hyperlipidemia    Annual physical exam 59/74/1638   Chronic systolic CHF (congestive heart failure) ([place]) 06/13/2012   Thyroid cancer, multifocal papillary carcinoma 01/31/2012   Automatic implantable cardioverter-defibrillator in situ 07/21/2010   GERD 07/14/2010   ANEMIA, IRON DEFICIENCY 05/27/2010   SHOULDER PAIN 05/26/2010   ASTHMA 04/23/2010   HOARSENESS, CHRONIC 02/11/2010   DM II (diabetes mellitus, type II), controlled ([place]) 08/06/2009   HYPOTHYROIDISM 12/21/2007   CARPAL TUNNEL SYNDROME 05/02/2007   Home Medication(s) Prior to Admission medications   Medication Sig Start Date End Date Taking? Authorizing Provider  oseltamivir (TAMIFLU) 75 MG capsule Take 1 capsule (75 mg total) by mouth every 12 (twelve) hours. 05/16/21  Yes Kadra Kohan, Grayce Sessions, MD  acetaminophen (TYLENOL) 500 MG tablet Take 1,000 mg by mouth every 6 (six) hours as needed (pain).    [provider]  albuterol (VENTOLIN HFA) 108 (90 Base) MCG/ACT inhaler Inhale 2 puffs into the lungs every 2 (two) hours as needed for wheezing or shortness of breath (cough). 02/25/21   Muthersbaugh, Jarrett Soho, PA-C  allopurinol (ZYLOPRIM) 100 MG tablet Take 1 tablet (100 mg total) by mouth daily. 12/09/20   Colon Branch, MD  aspirin EC 81 MG tablet Take 1 tablet (81 mg  total) by mouth daily. 08/29/14   Dunn, Nedra Hai, PA-C  atorvastatin (LIPITOR) 40 MG tablet Take 1 tablet (40 mg total) by mouth daily. 07/21/20   Colon Branch, MD  carvedilol (COREG) 12.5 MG tablet Take 1 tablet (12.5 mg total) by mouth daily. 02/27/21   Colon Branch, MD  dapagliflozin propanediol (FARXIGA) 10 MG TABS tablet Take 1 tablet (10 mg total) by mouth daily before breakfast. 11/20/20   Colon Branch, MD  Dulaglutide (TRULICITY) 1.5 QI/3.4VQ SOPN Inject into the skin. Take as directed    [provider]  fluticasone (FLONASE)  50 MCG/ACT nasal spray Place 2 sprays into both nostrils daily. 02/25/21   Muthersbaugh, Jarrett Soho, PA-C  furosemide (LASIX) 20 MG tablet Take 1 tablet (20 mg total) by mouth daily. 05/07/21   Colon Branch, MD  gabapentin (NEURONTIN) 300 MG capsule Take 1 capsule (300 mg total) by mouth at bedtime. 04/29/21   Colon Branch, MD  levothyroxine (SYNTHROID) 112 MCG tablet Take 2 tablets (224 mcg total) by mouth daily before breakfast. 11/24/20   Colon Branch, MD  losartan (COZAAR) 25 MG tablet Take 1 tablet (25 mg total) by mouth daily. 02/23/21   Colon Branch, MD  MAGNESIUM PO Take 1 tablet by mouth daily.    [provider]  metFORMIN (GLUCOPHAGE) 1000 MG tablet Take 1 tablet (1,000 mg total) by mouth 2 (two) times daily with a meal. 02/27/21   Colon Branch, MD  Multiple Vitamin (MULTIVITAMIN WITH MINERALS) TABS tablet Take 1 tablet by mouth daily.    [provider]  potassium chloride (KLOR-CON M10) 10 MEQ tablet Take 1 tablet (10 mEq total) by mouth daily. 04/02/21   Colon Branch, MD  tadalafil (CIALIS) 20 MG tablet Take 1 tablet (20 mg total) by mouth daily as needed for erectile dysfunction. 04/11/17   Colon Branch, MD  vitamin E 400 UNIT capsule Take 400 Units by mouth daily.    [provider]                                                                                                                                    Past Surgical History Past Surgical History:  Procedure Laterality Date   BI-VENTRICULAR IMPLANTABLE CARDIOVERTER DEFIBRILLATOR  (CRT-D)  03/2010   CARDIAC CATHETERIZATION  03/2010   EP IMPLANTABLE DEVICE N/A 05/21/2016   Procedure:  ICD Generator Changeout;  Surgeon: Evans Lance, MD;  Location: [place] [facility];  Service: Cardiovascular;  Laterality: N/A;   LEFT HEART CATHETERIZATION WITH CORONARY ANGIOGRAM N/A 09/06/2014   Procedure: LEFT HEART CATHETERIZATION WITH CORONARY ANGIOGRAM;  Surgeon: Burnell Blanks, MD;  Location: [facility] CATH LAB;  Service:  Cardiovascular;  Laterality: N/A;   PTX     traumatic, age 50 months   THYROIDECTOMY  12/10/2011   Procedure: TOTAL THYROIDECTOMY WITH CENTRAL COMPARTMENT DISSECTION;  Surgeon: Earnstine Regal,  MD;  Location: [facility];  Service: General;  Laterality: N/A;  Total thyroidectomy with limited central compartment lymph node dissection.   VASECTOMY     Family History Family History  Problem Relation Age of Onset   Atrial fibrillation Father        diseased at ag 20   Hypertension Father        alive @ 52.   Hypothyroidism Mother    Hypertension Mother        alive @ 9.   Diabetes Maternal Grandfather    Diabetes Maternal Aunt    Colon cancer Maternal Aunt    Colon cancer Maternal Aunt    Lung cancer Paternal Uncle    Lung cancer Maternal Uncle    Asthma Sister    Hypothyroidism Daughter    Prostate cancer Neg Hx    CAD Neg Hx     Social History Social History   Tobacco Use   Smoking status: Former    Packs/day: 2.50    Years: 2.00    Pack years: 5.00    Types: Cigarettes    Quit date: 11/29/1970    Years since quitting: 50.4   Smokeless tobacco: Former    Types: Nurse, children's Use: Never used  Substance Use Topics   Alcohol use: Yes    Comment: drinks rarely   Drug use: No   Allergies Losartan and Ramipril  Review of Systems Review of Systems  Constitutional:  Positive for fever.  All other systems are reviewed and are negative for acute change except as noted in the HPI  Physical Exam Vital Signs  I have reviewed the triage vital signs BP 129/86   Pulse 83   Temp 98.9 F (37.2 C) (Oral)   Resp 19   Ht 6\' 1"  (1.854 m)   Wt 113.4 kg   SpO2 95%   BMI 32.98 kg/m  Physical Exam Vitals reviewed.  Constitutional:      General: He is not in acute distress.    Appearance: He is well-developed. He is not diaphoretic.  HENT:     Head: Normocephalic and atraumatic.     Nose: Nose normal.  Eyes:     General: No scleral icterus.       Right eye: No  discharge.        Left eye: No discharge.     Conjunctiva/sclera: Conjunctivae normal.     Pupils: Pupils are equal, round, and reactive to light.  Cardiovascular:     Rate and Rhythm: Normal rate and regular rhythm.     Heart sounds: No murmur heard.   No friction rub. No gallop.  Pulmonary:     Effort: Pulmonary effort is normal. No respiratory distress.     Breath sounds: No stridor. Examination of the right-lower field reveals rhonchi. Examination of the left-lower field reveals rhonchi. Rhonchi present. No rales.  Abdominal:     General: There is no distension.     Palpations: Abdomen is soft.     Tenderness: There is no abdominal tenderness.  Musculoskeletal:        General: No tenderness.     Cervical back: Normal range of motion and neck supple.  Skin:    General: Skin is warm and dry.     Findings: No erythema or rash.  Neurological:     Mental Status: He is alert and oriented to person, place, and time.    ED Results and Treatments Labs (all labs ordered  are listed, but only abnormal results are displayed) Labs Reviewed  RESP PANEL BY RT-PCR (FLU A&B, COVID) ARPGX2 - Abnormal; Notable for the following components:      Result Value   Influenza A by PCR POSITIVE (*)    All other components within normal limits  COMPREHENSIVE METABOLIC PANEL - Abnormal; Notable for the following components:   Glucose, Bld 136 (*)    Calcium 8.2 (*)    Total Bilirubin 1.3 (*)    All other components within normal limits  CBC WITH DIFFERENTIAL/PLATELET - Abnormal; Notable for the following components:   Lymphs Abs 0.4 (*)    All other components within normal limits  BRAIN NATRIURETIC PEPTIDE - Abnormal; Notable for the following components:   B Natriuretic Peptide 184.7 (*)    All other components within normal limits  TROPONIN I (HIGH SENSITIVITY) - Abnormal; Notable for the following components:   Troponin I (High Sensitivity) 19 (*)    All other components within normal limits   LACTIC ACID, PLASMA  LACTIC ACID, PLASMA  TROPONIN I (HIGH SENSITIVITY)  TROPONIN I (HIGH SENSITIVITY)  TROPONIN I (HIGH SENSITIVITY)                                                                                                                         EKG  EKG Interpretation  Date/Time:  Friday May 15 2021 14:40:05 EDT Ventricular Rate:  92 PR Interval:  164 QRS Duration: 144 QT Interval:  400 QTC Calculation: 494 R Axis:   265 Text Interpretation: Atrial-sensed ventricular-paced rhythm Biventricular pacemaker detected Abnormal ECG Since last tracing PVCs no longer present Otherwise no significant change Confirmed by Calvert Cantor [id number]) on 05/15/2021 6:02:42 PM        Radiology DG Chest Portable 1 View  Result Date: 05/15/2021 CLINICAL DATA:  Cough, fever, shortness of breath, decreased oxygen saturation, and productive cough for 2 days, history hypertension, MI, pacemaker EXAM: PORTABLE CHEST 1 VIEW COMPARISON:  Portable exam 1501 hours compared to 03/07/2016 FINDINGS: LEFT subclavian ICD with leads projecting over RIGHT atrium, RIGHT ventricle, and coronary sinus. Minimal enlargement of cardiac silhouette. Mediastinal contours and pulmonary vascularity normal. Lungs clear. No pulmonary infiltrate, pleural effusion, or pneumothorax. Bones unremarkable. IMPRESSION: No acute abnormalities. Minimal enlargement of cardiac silhouette post ICD. Electronically Signed   By: Lavonia Dana M.D.   On: 05/15/2021 15:27    Pertinent labs & imaging results that were available during my care of the patient were reviewed by me and considered in my medical decision making (see chart for details).  Medications Ordered in ED Medications  acetaminophen (TYLENOL) tablet 650 mg (650 mg Oral Given 05/15/21 1454)  acetaminophen (TYLENOL) tablet 1,000 mg (1,000 mg Oral Given 05/16/21 0222)  oseltamivir (TAMIFLU) capsule 75 mg (75 mg Oral Given 05/16/21 0535)  Procedures Procedures  (including critical care time)  Medical Decision Making / ED Course I have reviewed the nursing notes for this encounter and the patient's prior records (if available in EHR or on provided paperwork).   Mike Jimenez was evaluated in Emergency Department on 05/16/2021 for the symptoms described in the history of present illness. He was evaluated in the context of the global COVID-19 pandemic, which necessitated consideration that the patient might be at risk for infection with the SARS-CoV-2 virus that causes COVID-19. Institutional protocols and algorithms that pertain to the evaluation of patients at risk for COVID-19 are in a state of rapid change based on information released by regulatory bodies including the CDC and federal and state organizations. These policies and algorithms were followed during the patient's care in the ED.  Patient presents with viral symptoms for 5-7 days. Adequate oral hydration.  Patient also complaining of chest pain related to chest congestion.  Rest of history as above.  Patient appears well. No signs of toxicity, patient is interactive. No hypoxia, tachypnea or other signs of respiratory distress. No sign of clinical dehydration. Lung exam bibasilar rhonchi. Rest of exam as above.  EKG without acute ischemic changes. Initial troponin negative.  Second troponin slightly elevated with a delta of 2.  Repeated a third time to determine trend and last troponin lower by 4.  Low suspicion for ACS as a contributing factor to his chest pain.  Low suspicion for pulmonary embolism.  Presentation not classic for aortic dissection or esophageal perforation.  Patient's respiratory panel positive for influenza. Rest of the labs grossly reassuring without leukocytosis or anemia.  No significant electrolyte derangements or renal sufficiency.  Chest x-ray without evidence  suggestive of pneumonia, pneumothorax, pneumomediastinum.  No abnormal contour of the mediastinum to suggest dissection. No evidence of acute injuries.  Patient is satting well on room air.  He does trend down but his sats improved after he coughs and clears the sputum.  Satting approximately 95% on room air.  Patient ambulated and stayed above 91%.  Feel patient is appropriate for continued outpatient management.  Will require Tamiflu.  Given the first dose in the ED.  Discussed symptomatic treatment with the patient and they will follow closely with their PCP.        Final Clinical Impression(s) / ED Diagnoses Final diagnoses:  Influenza   The patient appears reasonably screened and/or stabilized for discharge and I doubt any other medical condition or other [facility] requiring further screening, evaluation, or treatment in the ED at this time prior to discharge. Safe for discharge with strict return precautions.  Disposition: Discharge  Condition: Good  I have discussed the results, Dx and Tx plan with the patient/family who expressed understanding and agree(s) with the plan. Discharge instructions discussed at length. The patient/family was given strict return precautions who verbalized understanding of the instructions. No further questions at time of discharge.    ED Discharge Orders          Ordered    oseltamivir (TAMIFLU) 75 MG capsule  Every 12 hours        05/16/21 0534             Follow Up: Colon Branch, [address] [place] [address] [phone]  Schedule an appointment as soon as possible for a visit  in 3-5 days      This chart was dictated using voice recognition software.  Despite best efforts to proofread,  errors can occur which can change the documentation meaning.    Fatima Blank, MD 05/16/21 [phone]

## 2021-05-16 NOTE — ED Notes (Signed)
Pt tolerating saltines & water, requested OJ. OJ & additional saltines provided

## 2021-05-16 NOTE — ED Notes (Signed)
Pt remained at 91% and above with a HR of 110. Pt stated that he began to feel a little winded.

## 2021-05-16 NOTE — ED Notes (Signed)
Pt ambulatory to RR7 without assistance from staff

## 2021-05-18 ENCOUNTER — Telehealth: Payer: 59 | Admitting: Physician Assistant

## 2021-05-18 ENCOUNTER — Telehealth: Payer: Self-pay | Admitting: Internal Medicine

## 2021-05-18 DIAGNOSIS — R059 Cough, unspecified: Secondary | ICD-10-CM

## 2021-05-18 NOTE — Telephone Encounter (Signed)
-----   Message from Colon Branch, MD sent at 05/17/2021 11:35 AM EDT ----- Regarding: needs ER f/u at his convenience

## 2021-05-18 NOTE — Progress Notes (Signed)
Based on what you shared with me, I feel your condition warrants further evaluation and I recommend that you be seen in a face to face visit. I am unable to check your vital signs through an evisit and due to your recent concern for hypoxia I would recommend that you be seen for an in person visit.   NOTE: There will be NO CHARGE for this eVisit   If you are having a true medical emergency please call 911.      For an urgent face to face visit, [facility] has six urgent care centers for your convenience:     [place] [facility] [place] [facility] [place] Get Driving Directions [phone] [place], [place] [address]    [place] [facility] [place] [facility]) Get Driving Directions [phone] [place], [place] [address]  [facility] [place] ([place]) Get Driving Directions [phone] [address] [place],  [place]  [address]  [facility] Get Driving Directions [phone] [place], [place], [place] [address]   [facility] Get Driving Directions  [phone] [address], [place] [address]   [facility] [place] Get Driving Directions [phone] [address], [place],  [address]  Your MyChart E-visit questionnaire answers were reviewed by a board certified advanced clinical practitioner to complete your personal care plan based on your specific symptoms.  Thank you for using e-Visits.   Approximately 5 minutes was spent documenting and reviewing patient's chart.

## 2021-05-18 NOTE — Telephone Encounter (Signed)
Left voice mail to schedule appt

## 2021-05-19 ENCOUNTER — Encounter: Payer: Self-pay | Admitting: Internal Medicine

## 2021-05-20 ENCOUNTER — Other Ambulatory Visit: Payer: Self-pay

## 2021-05-20 ENCOUNTER — Encounter: Payer: Self-pay | Admitting: Internal Medicine

## 2021-05-20 ENCOUNTER — Ambulatory Visit: Payer: 59 | Admitting: Internal Medicine

## 2021-05-20 VITALS — BP 122/88 | HR 82 | Temp 98.5°F | Resp 16 | Ht 73.0 in | Wt 245.2 lb

## 2021-05-20 DIAGNOSIS — J111 Influenza due to unidentified influenza virus with other respiratory manifestations: Secondary | ICD-10-CM | POA: Diagnosis not present

## 2021-05-20 DIAGNOSIS — E119 Type 2 diabetes mellitus without complications: Secondary | ICD-10-CM

## 2021-05-20 NOTE — Patient Instructions (Signed)
Drink plenty of fluids  Mucinex twice daily until better  Albuterol: 2 puffs at least twice daily until better  Call for questions or concerns

## 2021-05-20 NOTE — Progress Notes (Signed)
Subjective:    Patient ID: Mike Jimenez, male    DOB: 1951-10-18, 70 y.o.   MRN: [id number]  DOS:  05/20/2021 Type of visit - description: ED f/u  Seen at the ER 05/15/2021: He presented with O2 sat of 82% at home, chest congestion, cough. Work-up: CMP, CBC, troponin, BMP, chest x-ray, nasal swabs reviewed.  Calcium was 8.2, slightly low.  BNP 184, slightly elevated, no previous values or  baseline.  Respiratory panel + influenza.  Chest x-ray with no pneumonia   He was given the first dose of Tamiflu and sent a prescription, patient was never able to get the prescription. He however improved. At this point he denies fever chills.  No chest pain No nausea or vomiting Had some diarrhea, that is better, he is back to his baseline which is soft stools. Still has some lack of energy and chest congestion.  Review of Systems See above   Past Medical History:  Diagnosis Date   Anemia    Arthritis    "knees" (09/05/2014)   Automatic implantable cardioverter-defibrillator in situ    CAD (coronary artery disease)    a. Mild nonobstructive CAD by cath 08/2014.   CHF (congestive heart failure) ([place])    Childhood asthma    Gastric ulcer 2011   GERD (gastroesophageal reflux disease)    History of gout    Hyperlipidemia    Hypertension    LBBB (left bundle branch block)    NICM (nonischemic cardiomyopathy) ([place])    a. Dx 2011 - presented with sustained VT. Normal coronaries at that time, EF 10-15% by cath and 20-25% by echo. b. s/p biventricular ICD 2011. c. CP 08/2014: mild nonobstructive CAD.   Obesity    Postsurgical hypothyroidism    Thyroid cancer ([place])    a. 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 Dr Harlow Asa. s/p thyroidectomy 2013.   Torn meniscus    Type II diabetes mellitus ([place])    Ventricular tachycardia ([place])    a. Sustained VT 2011 s/p MDT [id number] Concert BiV ICD, ser# [id number].    Past Surgical History:  Procedure Laterality Date   BI-VENTRICULAR  IMPLANTABLE CARDIOVERTER DEFIBRILLATOR  (CRT-D)  03/2010   CARDIAC CATHETERIZATION  03/2010   EP IMPLANTABLE DEVICE N/A 05/21/2016   Procedure:  ICD Generator Changeout;  Surgeon: Evans Lance, MD;  Location: [place] [facility];  Service: Cardiovascular;  Laterality: N/A;   LEFT HEART CATHETERIZATION WITH CORONARY ANGIOGRAM N/A 09/06/2014   Procedure: LEFT HEART CATHETERIZATION WITH CORONARY ANGIOGRAM;  Surgeon: Burnell Blanks, MD;  Location: [facility] CATH [facility];  Service: Cardiovascular;  Laterality: N/A;   PTX     traumatic, age 95 months   THYROIDECTOMY  12/10/2011   Procedure: TOTAL THYROIDECTOMY WITH CENTRAL COMPARTMENT DISSECTION;  Surgeon: Earnstine Regal, MD;  Location: [place];  Service: General;  Laterality: N/A;  Total thyroidectomy with limited central compartment lymph node dissection.   VASECTOMY      Allergies as of 05/20/2021       Reactions   Bee Venom    Other reaction(s): swelling   Losartan Other (See Comments)   Leg cramps   Ramipril Cough        Medication List        Accurate as of May 20, 2021 11:41 AM. If you have any questions, ask your nurse or doctor.          acetaminophen 500 MG tablet Commonly known as: TYLENOL Take 1,000 mg by mouth  every 6 (six) hours as needed (pain).   albuterol 108 (90 Base) MCG/ACT inhaler Commonly known as: VENTOLIN HFA Inhale 2 puffs into the lungs every 2 (two) hours as needed for wheezing or shortness of breath (cough).   allopurinol 100 MG tablet Commonly known as: ZYLOPRIM Take 1 tablet (100 mg total) by mouth daily.   aspirin EC 81 MG tablet Take 1 tablet (81 mg total) by mouth daily.   atorvastatin 40 MG tablet Commonly known as: LIPITOR Take 1 tablet (40 mg total) by mouth daily.   carvedilol 12.5 MG tablet Commonly known as: COREG Take 1 tablet (12.5 mg total) by mouth daily.   dapagliflozin propanediol 10 MG Tabs tablet Commonly known as: FARXIGA Take 1 tablet (10 mg total) by mouth daily before  breakfast.   fluticasone 50 MCG/ACT nasal spray Commonly known as: FLONASE Place 2 sprays into both nostrils daily.   furosemide 20 MG tablet Commonly known as: LASIX Take 1 tablet (20 mg total) by mouth daily.   gabapentin 300 MG capsule Commonly known as: NEURONTIN Take 1 capsule (300 mg total) by mouth at bedtime.   levothyroxine 112 MCG tablet Commonly known as: SYNTHROID Take 2 tablets (224 mcg total) by mouth daily before breakfast.   losartan 25 MG tablet Commonly known as: COZAAR Take 1 tablet (25 mg total) by mouth daily.   MAGNESIUM PO Take 1 tablet by mouth daily.   metFORMIN 1000 MG tablet Commonly known as: GLUCOPHAGE Take 1 tablet (1,000 mg total) by mouth 2 (two) times daily with a meal.   multivitamin with minerals Tabs tablet Take 1 tablet by mouth daily.   oseltamivir 75 MG capsule Commonly known as: TAMIFLU Take 1 capsule (75 mg total) by mouth every 12 (twelve) hours.   potassium chloride 10 MEQ tablet Commonly known as: Klor-Con M10 Take 1 tablet (10 mEq total) by mouth daily.   tadalafil 20 MG tablet Commonly known as: CIALIS Take 1 tablet (20 mg total) by mouth daily as needed for erectile dysfunction.   Trulicity 1.5 TM/1.9QQ Sopn Generic drug: Dulaglutide Inject into the skin. Take as directed   vitamin E 180 MG (400 UNITS) capsule Take 400 Units by mouth daily.           Objective:   Physical Exam BP 122/88 (BP Location: Left Arm, Patient Position: Sitting, Cuff Size: Normal)   Pulse 82   Temp 98.5 F (36.9 C) (Oral)   Resp 16   Ht 6\' 1"  (1.854 m)   Wt 245 lb 4 oz (111.2 kg)   SpO2 96%   BMI 32.36 kg/m  General:   Well developed, NAD, BMI noted. HEENT:  Normocephalic . Face symmetric, atraumatic Lungs:  Few rhonchi, few wheezes bilaterally Normal respiratory effort, no intercostal retractions, no accessory muscle use. Heart: RRR,  no murmur.  Lower extremities: no pretibial edema bilaterally  Skin: Not pale. Not  jaundice Neurologic:  alert & oriented X3.  Speech normal, gait appropriate for age and unassisted Psych--  Cognition and judgment appear intact.  Cooperative with normal attention span and concentration.  Behavior appropriate. No anxious or depressed appearing.      Assessment     Assessment DM,  actos intolerant 01-2018, sees Dr Buddy Duty + Mild neuropathy on exam 03/2017, HTN Hyperlipidemia Hypothyroidism h/oThyroid cancer, nodularity, no mets, thyroidectomy 2013- sees Dr Buddy Duty yearly as off 05/2020 CV: --CHF/nonischemic cardiomyopathy dx 2011 --Ventricular tachycardia, 2011,  EF  ~15 -20 % -- s/p  biventricular ICD 2011--- replaced  04-2016 --CAD -Nonobstructive   by cath 08-2014 --LBBB  GI: --GERD --H/o anemia:  --Gastric ulcer per EGD 07-2010 DJD (hip pain, XRs dosne 2017) H/o gout H/o asthma as a child  covid infex 01/2021   PLAN Influenza: Diagnosed with influenza few days ago, went to the ER, work-up reviewed. His pharmacy never received a prescription for Tamiflu so he did not take it however he is doing better. I noted some wheezing today, he has a leftover albuterol from the time he had COVID. Plan: Rest, fluids, Mucinex, albuterol twice daily until better.  See AVS.  Work note provided. RTC should be around 12/2021.    This visit occurred during the SARS-CoV-2 public health emergency.  Safety protocols were in place, including screening questions prior to the visit, additional usage of staff PPE, and extensive cleaning of exam room while observing appropriate contact time as indicated for disinfecting solutions.

## 2021-05-21 NOTE — Assessment & Plan Note (Signed)
Influenza: Diagnosed with influenza few days ago, went to the ER, work-up reviewed. His pharmacy never received a prescription for Tamiflu so he did not take it however he is doing better. I noted some wheezing today, he has a leftover albuterol from the time he had COVID. Plan: Rest, fluids, Mucinex, albuterol twice daily until better.  See AVS.  Work note provided. RTC should be around 12/2021.

## 2021-06-05 ENCOUNTER — Telehealth: Payer: Self-pay | Admitting: *Deleted

## 2021-06-05 ENCOUNTER — Encounter: Payer: Self-pay | Admitting: Gastroenterology

## 2021-06-05 ENCOUNTER — Ambulatory Visit (INDEPENDENT_AMBULATORY_CARE_PROVIDER_SITE_OTHER): Payer: 59 | Admitting: Gastroenterology

## 2021-06-05 VITALS — BP 130/72 | HR 70 | Ht 73.0 in | Wt 252.0 lb

## 2021-06-05 DIAGNOSIS — Z86010 Personal history of colonic polyps: Secondary | ICD-10-CM | POA: Diagnosis not present

## 2021-06-05 DIAGNOSIS — K529 Noninfective gastroenteritis and colitis, unspecified: Secondary | ICD-10-CM | POA: Diagnosis not present

## 2021-06-05 DIAGNOSIS — R9430 Abnormal result of cardiovascular function study, unspecified: Secondary | ICD-10-CM | POA: Diagnosis not present

## 2021-06-05 MED ORDER — NA SULFATE-K SULFATE-MG SULF 17.5-3.13-1.6 GM/177ML PO SOLN: 1.0000 | Freq: Once | ORAL | 0 refills | Status: AC

## 2021-06-05 NOTE — Progress Notes (Signed)
Assessment and plan noted ?

## 2021-06-05 NOTE — Progress Notes (Signed)
06/05/2021 Mike Jimenez [id number] 11-20-1951   HISTORY OF PRESENT ILLNESS: This is a 70 year old male is a patient of Dr. Blanch Media, known to him remotely for colonoscopy in 2011.  At that time he was found to have 4 small colon polyps that were removed and were adenomatous on pathology.  He also had mild diverticulosis in the sigmoid colon and internal hemorrhoids.  He returns today to schedule another colonoscopy.  He has an ICD in place, follows with cardiology, but from what I can tell he was last seen by his cardiologist, Dr. Lovena Le, in January 2021.  His last ECHO was in 2015 at which time his EF was 20 to 25%.  He is not on any anticoagulation.  He is diabetic and is on several diabetes medications.  He reports chronic diarrhea.  He says that he usually has about 2-3 bowel movements a day, but they are always loose or watery.  He denies any nocturnal stools.  He denies seeing any blood in his stools.  He is on metformin 1000 mg twice daily and Trulicity as well as others.  Referred here by Dr. [name] on this occasion for screening colonoscopy.   Past Medical History:  Diagnosis Date   Anemia    Arthritis    "knees" (09/05/2014)   Automatic implantable cardioverter-defibrillator in situ    CAD (coronary artery disease)    a. Mild nonobstructive CAD by cath 08/2014.   CHF (congestive heart failure) ([place])    Childhood asthma    Gastric ulcer 2011   GERD (gastroesophageal reflux disease)    History of gout    Hyperlipidemia    Hypertension    LBBB (left bundle branch block)    NICM (nonischemic cardiomyopathy) ([place])    a. Dx 2011 - presented with sustained VT. Normal coronaries at that time, EF 10-15% by cath and 20-25% by echo. b. s/p biventricular ICD 2011. c. CP 08/2014: mild nonobstructive CAD.   Obesity    Postsurgical hypothyroidism    Thyroid cancer ([place])    a. 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 Dr Harlow Asa. s/p thyroidectomy 2013.   Torn meniscus    Type II  diabetes mellitus ([place])    Ventricular tachycardia ([place])    a. Sustained VT 2011 s/p MDT [id number] Concert BiV ICD, ser# [id number].   Past Surgical History:  Procedure Laterality Date   BI-VENTRICULAR IMPLANTABLE CARDIOVERTER DEFIBRILLATOR  (CRT-D)  03/2010   CARDIAC CATHETERIZATION  03/2010   EP IMPLANTABLE DEVICE N/A 05/21/2016   Procedure:  ICD Generator Changeout;  Surgeon: Evans Lance, MD;  Location: [place] [facility];  Service: Cardiovascular;  Laterality: N/A;   LEFT HEART CATHETERIZATION WITH CORONARY ANGIOGRAM N/A 09/06/2014   Procedure: LEFT HEART CATHETERIZATION WITH CORONARY ANGIOGRAM;  Surgeon: Burnell Blanks, MD;  Location: [facility];  Service: Cardiovascular;  Laterality: N/A;   PTX     traumatic, age 11 months   THYROIDECTOMY  12/10/2011   Procedure: TOTAL THYROIDECTOMY WITH CENTRAL COMPARTMENT DISSECTION;  Surgeon: Earnstine Regal, MD;  Location: [place];  Service: General;  Laterality: N/A;  Total thyroidectomy with limited central compartment lymph node dissection.   VASECTOMY      reports that he quit smoking about 50 years ago. His smoking use included cigarettes. He has a 5.00 pack-year smoking history. He has quit using smokeless tobacco.  His smokeless tobacco use included chew. He reports current alcohol use. He reports that he does not use drugs. family  history includes Asthma in his sister; Atrial fibrillation in his father; Colon cancer in his maternal aunt and maternal aunt; Diabetes in his maternal aunt and maternal grandfather; Hypertension in his father and mother; Hypothyroidism in his daughter and mother; Lung cancer in his maternal uncle and paternal uncle. Allergies  Allergen Reactions   Bee Venom     Other reaction(s): swelling   Losartan Other (See Comments)    Leg cramps   Ramipril Cough      Outpatient Encounter Medications as of 06/05/2021  Medication Sig   acetaminophen (TYLENOL) 500 MG tablet Take 1,000 mg by mouth every 6 (six) hours as  needed (pain).   albuterol (VENTOLIN HFA) 108 (90 Base) MCG/ACT inhaler Inhale 2 puffs into the lungs every 2 (two) hours as needed for wheezing or shortness of breath (cough).   allopurinol (ZYLOPRIM) 100 MG tablet Take 1 tablet (100 mg total) by mouth daily.   aspirin EC 81 MG tablet Take 1 tablet (81 mg total) by mouth daily.   atorvastatin (LIPITOR) 40 MG tablet Take 1 tablet (40 mg total) by mouth daily.   carvedilol (COREG) 12.5 MG tablet Take 1 tablet (12.5 mg total) by mouth daily.   dapagliflozin propanediol (FARXIGA) 10 MG TABS tablet Take 1 tablet (10 mg total) by mouth daily before breakfast.   Dulaglutide (TRULICITY) 1.5 TD/4.2AJ SOPN Inject into the skin. Take as directed   fluticasone (FLONASE) 50 MCG/ACT nasal spray Place 2 sprays into both nostrils daily.   furosemide (LASIX) 20 MG tablet Take 1 tablet (20 mg total) by mouth daily.   gabapentin (NEURONTIN) 300 MG capsule Take 1 capsule (300 mg total) by mouth at bedtime.   levothyroxine (SYNTHROID) 112 MCG tablet Take 2 tablets (224 mcg total) by mouth daily before breakfast.   losartan (COZAAR) 25 MG tablet Take 1 tablet (25 mg total) by mouth daily.   MAGNESIUM PO Take 1 tablet by mouth daily.   metFORMIN (GLUCOPHAGE) 1000 MG tablet Take 1 tablet (1,000 mg total) by mouth 2 (two) times daily with a meal.   Multiple Vitamin (MULTIVITAMIN WITH MINERALS) TABS tablet Take 1 tablet by mouth daily.   potassium chloride (KLOR-CON M10) 10 MEQ tablet Take 1 tablet (10 mEq total) by mouth daily.   tadalafil (CIALIS) 20 MG tablet Take 1 tablet (20 mg total) by mouth daily as needed for erectile dysfunction.   vitamin E 400 UNIT capsule Take 400 Units by mouth daily.   No facility-administered encounter medications on file as of 06/05/2021.    REVIEW OF SYSTEMS  : All other systems reviewed and negative except where noted in the History of Present Illness.   PHYSICAL EXAM: BP 130/72   Pulse 70   Ht 6\' 1"  (1.854 m)   Wt 252 lb  (114.3 kg)   SpO2 99%   BMI 33.25 kg/m  General: Well developed white male in no acute distress Head: Normocephalic and atraumatic Eyes:  Sclerae anicteric, conjunctiva pink. Ears: Normal auditory acuity Lungs: Clear throughout to auscultation; no W/R/R. Heart: Regular rate and rhythm; no M/R/G. Abdomen: Soft, non-distended.  BS present.  Non-tender. Rectal:  Will be done at the time of colonoscopy. Musculoskeletal: Symmetrical with no gross deformities  Skin: No lesions on visible extremities Extremities: No edema  Neurological: Alert oriented x 4, grossly non-focal Psychological:  Alert and cooperative. Normal mood and affect  ASSESSMENT AND PLAN: *Personal history of colon polyps:  Adenomatous colon polyps in 2011.  Will plan for colonoscopy with Dr.  Henrene Pastor, but Dr. Henrene Pastor requesting cardiology clearance first so we will send a note to his cardiologist, Dr. Lovena Le.  Patient is being scheduled at [facility] hospital due to low EF at 20-25% on last ECHO in 2015. *Chronic diarrhea:  Likely medication side effects due to his diabetes medications, Metformin, Trulicity, etc, but may want to consider random biopsies during colonoscopy.  **The risks, benefits, and alternatives to colonoscopy were discussed with the patient and he consents to proceed.   CC:  Colon Branch, MD

## 2021-06-05 NOTE — Telephone Encounter (Signed)
Dear Dr. Lovena Le:    This patient has been scheduled for a colonoscopy in the near future, under IV anesthesia (Propofol).  Please FAX a note of MEDICAL CLEARANCE to [phone] to ATTN:  Azul Brumett.  Please call to advise if this patient will require an office visit or further medical work-up before clearance can be given:  [phone] (main number) and leave a message.   Thank you,    Horris Latino, CMA

## 2021-06-05 NOTE — Patient Instructions (Signed)
You have been scheduled for a colonoscopy. Please follow written instructions given to you at your visit today.  Please pick up your prep supplies at the pharmacy within the next 1-3 days. If you use inhalers (even only as needed), please bring them with you on the day of your procedure.   If you are age 69 or older, your body mass index should be between 23-30. Your Body mass index is 33.25 kg/m. If this is out of the aforementioned range listed, please consider follow up with your Primary Care Provider.  If you are age 49 or younger, your body mass index should be between 19-25. Your Body mass index is 33.25 kg/m. If this is out of the aformentioned range listed, please consider follow up with your Primary Care Provider.   __________________________________________________________  The [facility] providers would like to encourage you to use [facility] to communicate with providers for non-urgent requests or questions.  Due to long hold times on the telephone, sending your provider a message by [facility] may be a faster and more efficient way to get a response.  Please allow 48 business hours for a response.  Please remember that this is for non-urgent requests.

## 2021-06-08 ENCOUNTER — Ambulatory Visit (INDEPENDENT_AMBULATORY_CARE_PROVIDER_SITE_OTHER): Payer: 59

## 2021-06-08 DIAGNOSIS — I5022 Chronic systolic (congestive) heart failure: Secondary | ICD-10-CM | POA: Diagnosis not present

## 2021-06-08 DIAGNOSIS — Z95810 Presence of automatic (implantable) cardiac defibrillator: Secondary | ICD-10-CM | POA: Diagnosis not present

## 2021-06-10 NOTE — Progress Notes (Signed)
EPIC Encounter for ICM Monitoring  Patient Name: Mike Jimenez is a 69 y.o. male Date: 06/10/2021 Primary Care Physican: Colon Branch, MD Primary Cardiologist: Lovena Le Electrophysiologist: Santina Evans Pacing:  98.1% 06/10/2021 Weight: 245-250 lbs   Time in AT/AF     0.0 hr/day (0.0%)                                            Spoke with patient and heart failure questions reviewed.  Pt asymptomatic for fluid accumulation and feeing well.  He had the flu during decreased impedance around July 4th.    OptiVol Thoracic impedance suggesting normal fluid levels.   Prescribed:  Furosemide 20 mg 1 tablet (20 mg total) daily.   Potassium 10 mEq 1 tablet (10 mEq total) daily   Labs: 01/19/2021 Creatinine 1.22, BUN 21, Potassium 3.7, Sodium 139, GFR 60.74 A complete set of results can be found in Results Review.   Recommendations:  No changes and encouraged to call if experiencing any fluid symptoms.   Follow-up plan: ICM clinic phone appointment on 07/13/2021.   91 day device clinic remote transmission 06/23/2021.     EP/Cardiology Office Visits:  07/01/2021 with Oda Kilts, [place].     Copy of ICM check sent to Dr. Lovena Le.   3 month ICM trend: 06/08/2021.    1 Year ICM trend:       Rosalene Billings, RN 06/10/2021 9:17 AM

## 2021-06-15 NOTE — Telephone Encounter (Signed)
Patient informed of clearance.

## 2021-06-15 NOTE — Telephone Encounter (Signed)
Left message for patient to call office.  

## 2021-06-19 LAB — HM DIABETES FOOT EXAM

## 2021-06-23 ENCOUNTER — Ambulatory Visit (INDEPENDENT_AMBULATORY_CARE_PROVIDER_SITE_OTHER): Payer: 59

## 2021-06-23 DIAGNOSIS — I428 Other cardiomyopathies: Secondary | ICD-10-CM | POA: Diagnosis not present

## 2021-06-23 LAB — CUP PACEART REMOTE DEVICE CHECK
Battery Remaining Longevity: 26 mo
Battery Voltage: 2.94 V
Brady Statistic AP VP Percent: 0.1 %
Brady Statistic AP VS Percent: 0.02 %
Brady Statistic AS VP Percent: 97.92 %
Brady Statistic AS VS Percent: 1.96 %
Brady Statistic RA Percent Paced: 0.12 %
Brady Statistic RV Percent Paced: 84 %
Date Time Interrogation Session: 20220726082605
HighPow Impedance: 58 Ohm
HighPow Impedance: 79 Ohm
Implantable Lead Implant Date: 20110516
Implantable Lead Implant Date: 20110516
Implantable Lead Implant Date: 20110516
Implantable Lead Location: 753858
Implantable Lead Location: 753859
Implantable Lead Location: 753860
Implantable Lead Model: 5076
Implantable Lead Model: 6947
Implantable Pulse Generator Implant Date: 20170623
Lead Channel Impedance Value: 342 Ohm
Lead Channel Impedance Value: 437 Ohm
Lead Channel Impedance Value: 437 Ohm
Lead Channel Impedance Value: 494 Ohm
Lead Channel Impedance Value: 646 Ohm
Lead Channel Impedance Value: 950 Ohm
Lead Channel Pacing Threshold Amplitude: 0.875 V
Lead Channel Pacing Threshold Amplitude: 1 V
Lead Channel Pacing Threshold Amplitude: 1.125 V
Lead Channel Pacing Threshold Pulse Width: 0.4 ms
Lead Channel Pacing Threshold Pulse Width: 0.4 ms
Lead Channel Pacing Threshold Pulse Width: 0.4 ms
Lead Channel Sensing Intrinsic Amplitude: 2.75 mV
Lead Channel Sensing Intrinsic Amplitude: 2.75 mV
Lead Channel Sensing Intrinsic Amplitude: 8.75 mV
Lead Channel Sensing Intrinsic Amplitude: 8.75 mV
Lead Channel Setting Pacing Amplitude: 2 V
Lead Channel Setting Pacing Amplitude: 2.5 V
Lead Channel Setting Pacing Amplitude: 2.5 V
Lead Channel Setting Pacing Pulse Width: 0.4 ms
Lead Channel Setting Pacing Pulse Width: 0.4 ms
Lead Channel Setting Sensing Sensitivity: 0.3 mV

## 2021-06-30 NOTE — Progress Notes (Signed)
Electrophysiology Office Note Date: 07/01/2021  ID:  Abbott, Jasinski 08/05/1951, MRN [id number]  PCP: Colon Branch, MD Primary Cardiologist: None Electrophysiologist: Cristopher Peru, MD   CC: Routine ICD follow-up  ANTHEM FRAZER is a 70 y.o. male seen today for Cristopher Peru, MD for routine electrophysiology followup.  Since last being seen in our clinic the patient reports doing well overall. Had COVID earlier this year but otherwise no complaints or issues.  he denies chest pain, palpitations, dyspnea, PND, orthopnea, nausea, vomiting, dizziness, syncope, edema, weight gain, or early satiety. He has not had ICD shocks.   Device History: MDT CRTD implanted 2011 for NICM, CHF; gen change 2017 History of appropriate therapy: No History of AAD therapy: No  Past Medical History:  Diagnosis Date   Anemia    Arthritis    "knees" (09/05/2014)   Automatic implantable cardioverter-defibrillator in situ    CAD (coronary artery disease)    a. Mild nonobstructive CAD by cath 08/2014.   CHF (congestive heart failure) ([place])    Childhood asthma    Gastric ulcer 2011   GERD (gastroesophageal reflux disease)    History of gout    Hyperlipidemia    Hypertension    LBBB (left bundle branch block)    NICM (nonischemic cardiomyopathy) ([place])    a. Dx 2011 - presented with sustained VT. Normal coronaries at that time, EF 10-15% by cath and 20-25% by echo. b. s/p biventricular ICD 2011. c. CP 08/2014: mild nonobstructive CAD.   Obesity    Postsurgical hypothyroidism    Thyroid cancer ([place])    a. 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 Dr Harlow Asa. s/p thyroidectomy 2013.   Torn meniscus    Type II diabetes mellitus ([place])    Ventricular tachycardia ([place])    a. Sustained VT 2011 s/p MDT [id number] Concert BiV ICD, ser# [id number].   Past Surgical History:  Procedure Laterality Date   BI-VENTRICULAR IMPLANTABLE CARDIOVERTER DEFIBRILLATOR  (CRT-D)  03/2010   CARDIAC CATHETERIZATION  03/2010    EP IMPLANTABLE DEVICE N/A 05/21/2016   Procedure:  ICD Generator Changeout;  Surgeon: Evans Lance, MD;  Location: [facility];  Service: Cardiovascular;  Laterality: N/A;   LEFT HEART CATHETERIZATION WITH CORONARY ANGIOGRAM N/A 09/06/2014   Procedure: LEFT HEART CATHETERIZATION WITH CORONARY ANGIOGRAM;  Surgeon: Burnell Blanks, MD;  Location: [facility];  Service: Cardiovascular;  Laterality: N/A;   PTX     traumatic, age 87 months   THYROIDECTOMY  12/10/2011   Procedure: TOTAL THYROIDECTOMY WITH CENTRAL COMPARTMENT DISSECTION;  Surgeon: Earnstine Regal, MD;  Location: [facility];  Service: General;  Laterality: N/A;  Total thyroidectomy with limited central compartment lymph node dissection.   VASECTOMY      Current Outpatient Medications  Medication Sig Dispense Refill   ACCU-CHEK GUIDE test strip TEST BLOOD SUGAR ONCE DAILY     acetaminophen (TYLENOL) 500 MG tablet Take 1,000 mg by mouth every 6 (six) hours as needed (pain).     allopurinol (ZYLOPRIM) 100 MG tablet Take 1 tablet (100 mg total) by mouth daily. 90 tablet 3   aspirin EC 81 MG tablet Take 1 tablet (81 mg total) by mouth daily.     atorvastatin (LIPITOR) 40 MG tablet Take 1 tablet (40 mg total) by mouth daily. 90 tablet 1   Blood Glucose Monitoring Suppl (ACCU-CHEK GUIDE) w/Device KIT TEST BLOOD SUGAR ONCE DAILY     carvedilol (COREG) 12.5 MG tablet Take 1  tablet (12.5 mg total) by mouth daily. 90 tablet 1   dapagliflozin propanediol (FARXIGA) 10 MG TABS tablet Take 1 tablet (10 mg total) by mouth daily before breakfast.     Dulaglutide (TRULICITY) 1.5 QT/6.2UQ SOPN Inject into the skin. Take as directed     furosemide (LASIX) 20 MG tablet Take 1 tablet (20 mg total) by mouth daily. 90 tablet 2   gabapentin (NEURONTIN) 300 MG capsule Take 1 capsule (300 mg total) by mouth at bedtime. 90 capsule 2   levothyroxine (SYNTHROID) 112 MCG tablet Take 2 tablets (224 mcg total) by mouth daily before breakfast.     losartan  (COZAAR) 25 MG tablet Take 1 tablet (25 mg total) by mouth daily. 90 tablet 1   MAGNESIUM PO Take 1 tablet by mouth daily.     metFORMIN (GLUCOPHAGE) 1000 MG tablet Take 1 tablet (1,000 mg total) by mouth 2 (two) times daily with a meal. 180 tablet 1   Multiple Vitamin (MULTIVITAMIN WITH MINERALS) TABS tablet Take 1 tablet by mouth daily.     potassium chloride (KLOR-CON M10) 10 MEQ tablet Take 1 tablet (10 mEq total) by mouth daily. 90 tablet 1   tadalafil (CIALIS) 20 MG tablet Take 1 tablet (20 mg total) by mouth daily as needed for erectile dysfunction. 10 tablet 5   vitamin E 400 UNIT capsule Take 400 Units by mouth daily.     No current facility-administered medications for this visit.    Allergies:   Bee venom and Ramipril   Social History: Social History   Socioeconomic History   Marital status: Married    Spouse name: Not on file   Number of children: 2   Years of education: Not on file   Highest education level: Not on file  Occupational History   Occupation: [employer]   Occupation: to retire 2023  Tobacco Use   Smoking status: Former    Packs/day: 2.50    Years: 2.00    Pack years: 5.00    Types: Cigarettes    Quit date: 11/29/1970    Years since quitting: 50.6   Smokeless tobacco: Former    Types: Nurse, children's Use: Never used  Substance and Sexual Activity   Alcohol use: Yes    Comment: drinks rarely   Drug use: No   Sexual activity: Yes  Other Topics Concern   Not on file  Social History Narrative   Live w/ wife , daughter [name] lives in [place]   8 dogs, fish pond.   Job is  physical.   Does not routinely exercise.   Social Determinants of Health   Financial Resource Strain: Low Risk    Difficulty of Paying Living Expenses: Not hard at all  Food Insecurity: No Food Insecurity   Worried About Charity fundraiser in the Last Year: Never true   [place] in the Last Year: Never true  Transportation Needs: No Transportation Needs   Lack of  Transportation (Medical): No   Lack of Transportation (Non-Medical): No  Physical Activity: Inactive   Days of Exercise per Week: 0 days   Minutes of Exercise per Session: 0 min  Stress: No Stress Concern Present   Feeling of Stress : Not at all  Social Connections: Moderately Isolated   Frequency of Communication with Friends and Family: More than three times a week   Frequency of Social Gatherings with Friends and Family: More than three times a week  Attends Religious Services: Never   Active Member of Clubs or Organizations: No   Attends Archivist Meetings: Never   Marital Status: Married  Human resources officer Violence: Not At Risk   Fear of Current or Ex-Partner: No   Emotionally Abused: No   Physically Abused: No   Sexually Abused: No    Family History: Family History  Problem Relation Age of Onset   Atrial fibrillation Father        diseased at ag 42   Hypertension Father        alive @ 94.   Hypothyroidism Mother    Hypertension Mother        alive @ 78.   Diabetes Maternal Grandfather    Diabetes Maternal Aunt    Colon cancer Maternal Aunt    Colon cancer Maternal Aunt    Lung cancer Paternal Uncle    Lung cancer Maternal Uncle    Asthma Sister    Hypothyroidism Daughter    Prostate cancer Neg Hx    CAD Neg Hx     Review of Systems: All other systems reviewed and are otherwise negative except as noted above.   Physical Exam: Vitals:   07/01/21 0939  BP: 130/84  Pulse: 77  SpO2: 95%  Weight: 251 lb 6.4 oz (114 kg)  Height: 6' 2" (1.88 m)     GEN- The patient is well appearing, alert and oriented x 3 today.   HEENT: normocephalic, atraumatic; sclera clear, conjunctiva pink; hearing intact; oropharynx clear; neck supple, no JVP Lymph- no cervical lymphadenopathy Lungs- Clear to ausculation bilaterally, normal work of breathing.  No wheezes, rales, rhonchi Heart- Regular rate and rhythm, no murmurs, rubs or gallops, PMI not laterally  displaced GI- soft, non-tender, non-distended, bowel sounds present, no hepatosplenomegaly Extremities- no clubbing or cyanosis. No edema; DP/PT/radial pulses 2+ bilaterally MS- no significant deformity or atrophy Skin- warm and dry, no rash or lesion; ICD pocket well healed Psych- euthymic mood, full affect Neuro- strength and sensation are intact  ICD interrogation- reviewed in detail today,  See PACEART report  EKG:  EKG is not ordered today. Personal review of ekg ordered 04/2021 shows BiV paced at 92 bpm with QRS of 144 ms but in RBBB pattern with upright V1 and downward lead 1.   Recent Labs: 04/29/2021: TSH 0.11 05/15/2021: ALT 17; B Natriuretic Peptide 184.7; BUN 16; Creatinine, Ser 1.08; Hemoglobin 13.3; Platelets 207; Potassium 4.6; Sodium 136   Wt Readings from Last 3 Encounters:  07/01/21 251 lb 6.4 oz (114 kg)  06/05/21 252 lb (114.3 kg)  05/20/21 245 lb 4 oz (111.2 kg)     Other studies Reviewed: Additional studies/ records that were reviewed today include: Previous EP office notes.   Assessment and Plan:  1.  Chronic systolic dysfunction s/p Medtronic CRT-D  euvolemic today Stable on an appropriate medical regimen Normal ICD function See Pace Art report No changes today Continue coreg, losartan, and farxiga ? Arlyce Harman and entresto. Labs today and update echo for further guidance.  No echo in quite some time  2. HTN Stable on current regimen  3. Cardiac Clearance for Colonoscopy Already scheduled.  He has no s/s of CHF. No ischemic complaints. Should be OK to proceed.  Will update Echo (Last 2015) but do not foresee issue. Likely at least low to moderate risk if EF remains in 25% range.   Current medicines are reviewed at length with the patient today.   The patient does not have concerns  regarding his medicines.  The following changes were made today:  none  Labs/ tests ordered today include:  Orders Placed This Encounter  Procedures   Comprehensive  metabolic panel   CBC   Pro b natriuretic peptide (BNP)   ECHOCARDIOGRAM COMPLETE    Disposition:   Follow up with EP APP in  6 months. Sooner if med adjustment needed. He is not followed by gen cards so will likely recommend referral for continued management of his cardiomyopathy.   Jacalyn Lefevre, PA-C  07/01/2021 10:03 AM  [facility] [address] [place] [address] [phone] (office) [phone] (fax)

## 2021-07-01 ENCOUNTER — Ambulatory Visit (INDEPENDENT_AMBULATORY_CARE_PROVIDER_SITE_OTHER): Payer: 59 | Admitting: Student

## 2021-07-01 ENCOUNTER — Encounter: Payer: Self-pay | Admitting: Student

## 2021-07-01 ENCOUNTER — Other Ambulatory Visit: Payer: Self-pay

## 2021-07-01 VITALS — BP 130/84 | HR 77 | Ht 74.0 in | Wt 251.4 lb

## 2021-07-01 DIAGNOSIS — I10 Essential (primary) hypertension: Secondary | ICD-10-CM

## 2021-07-01 DIAGNOSIS — I5022 Chronic systolic (congestive) heart failure: Secondary | ICD-10-CM | POA: Diagnosis not present

## 2021-07-01 DIAGNOSIS — I428 Other cardiomyopathies: Secondary | ICD-10-CM

## 2021-07-01 LAB — CUP PACEART INCLINIC DEVICE CHECK
Battery Remaining Longevity: 26 mo
Battery Voltage: 2.93 V
Brady Statistic AP VP Percent: 0.55 %
Brady Statistic AP VS Percent: 0.02 %
Brady Statistic AS VP Percent: 97.8 %
Brady Statistic AS VS Percent: 1.63 %
Brady Statistic RA Percent Paced: 0.57 %
Brady Statistic RV Percent Paced: 92.19 %
Date Time Interrogation Session: 20220803100102
HighPow Impedance: 49 Ohm
HighPow Impedance: 65 Ohm
Implantable Lead Implant Date: 20110516
Implantable Lead Implant Date: 20110516
Implantable Lead Implant Date: 20110516
Implantable Lead Location: 753858
Implantable Lead Location: 753859
Implantable Lead Location: 753860
Implantable Lead Model: 5076
Implantable Lead Model: 6947
Implantable Pulse Generator Implant Date: 20170623
Lead Channel Impedance Value: 285 Ohm
Lead Channel Impedance Value: 342 Ohm
Lead Channel Impedance Value: 399 Ohm
Lead Channel Impedance Value: 437 Ohm
Lead Channel Impedance Value: 570 Ohm
Lead Channel Impedance Value: 874 Ohm
Lead Channel Pacing Threshold Amplitude: 0.875 V
Lead Channel Pacing Threshold Amplitude: 1.125 V
Lead Channel Pacing Threshold Amplitude: 1.375 V
Lead Channel Pacing Threshold Pulse Width: 0.4 ms
Lead Channel Pacing Threshold Pulse Width: 0.4 ms
Lead Channel Pacing Threshold Pulse Width: 0.4 ms
Lead Channel Sensing Intrinsic Amplitude: 2.5 mV
Lead Channel Sensing Intrinsic Amplitude: 2.625 mV
Lead Channel Sensing Intrinsic Amplitude: 7.625 mV
Lead Channel Sensing Intrinsic Amplitude: 9.5 mV
Lead Channel Setting Pacing Amplitude: 2.25 V
Lead Channel Setting Pacing Amplitude: 2.5 V
Lead Channel Setting Pacing Amplitude: 3 V
Lead Channel Setting Pacing Pulse Width: 0.4 ms
Lead Channel Setting Pacing Pulse Width: 0.4 ms
Lead Channel Setting Sensing Sensitivity: 0.3 mV

## 2021-07-01 NOTE — Patient Instructions (Signed)
Medication Instructions:  Your physician recommends that you continue on your current medications as directed. Please refer to the Current Medication list given to you today.  *If you need a refill on your cardiac medications before your next appointment, please call your pharmacy*   Lab Work: TODAY: CMET, CBC, BNP  If you have labs (blood work) drawn today and your tests are completely normal, you will receive your results only by: [place] (if you have MyChart) OR A paper copy in the mail If you have any lab test that is abnormal or we need to change your treatment, we will call you to review the results.   Testing/Procedures: Your physician has requested that you have an echocardiogram. Echocardiography is a painless test that uses sound waves to create images of your heart. It provides your doctor with information about the size and shape of your heart and how well your heart's chambers and valves are working. This procedure takes approximately one hour. There are no restrictions for this procedure.   Follow-Up: At [facility], you and your health needs are our priority.  As part of our continuing mission to provide you with exceptional heart care, we have created designated Provider Care Teams.  These Care Teams include your primary Cardiologist (physician) and Advanced Practice Providers (APPs -  Physician Assistants and Nurse Practitioners) who all work together to provide you with the care you need, when you need it.   Your next appointment:   6 month(s)  The format for your next appointment:   In Person  Provider:   Legrand Como "Oda Kilts, PA-C

## 2021-07-02 LAB — COMPREHENSIVE METABOLIC PANEL
ALT: 16 IU/L (ref 0–44)
AST: 17 IU/L (ref 0–40)
Albumin/Globulin Ratio: 1.6 (ref 1.2–2.2)
Albumin: 4.5 g/dL (ref 3.8–4.8)
Alkaline Phosphatase: 90 IU/L (ref 44–121)
BUN/Creatinine Ratio: 19 (ref 10–24)
BUN: 21 mg/dL (ref 8–27)
Bilirubin Total: 0.3 mg/dL (ref 0.0–1.2)
CO2: 22 mmol/L (ref 20–29)
Calcium: 9 mg/dL (ref 8.6–10.2)
Chloride: 101 mmol/L (ref 96–106)
Creatinine, Ser: 1.08 mg/dL (ref 0.76–1.27)
Globulin, Total: 2.8 g/dL (ref 1.5–4.5)
Glucose: 129 mg/dL — ABNORMAL HIGH (ref 65–99)
Potassium: 4 mmol/L (ref 3.5–5.2)
Sodium: 140 mmol/L (ref 134–144)
Total Protein: 7.3 g/dL (ref 6.0–8.5)
eGFR: 74 mL/min/{1.73_m2} (ref >59)

## 2021-07-02 LAB — CBC
Hematocrit: 39.1 % (ref 37.5–51.0)
Hemoglobin: 13.1 g/dL (ref 13.0–17.7)
MCH: 29.6 pg (ref 26.6–33.0)
MCHC: 33.5 g/dL (ref 31.5–35.7)
MCV: 88 fL (ref 79–97)
Platelets: 260 10*3/uL (ref 150–450)
RBC: 4.43 x10E6/uL (ref 4.14–5.80)
RDW: 13.7 % (ref 11.6–15.4)
WBC: 5.5 10*3/uL (ref 3.4–10.8)

## 2021-07-02 LAB — PRO B NATRIURETIC PEPTIDE: NT-Pro BNP: 1268 pg/mL — ABNORMAL HIGH (ref 0–376)

## 2021-07-13 ENCOUNTER — Ambulatory Visit (INDEPENDENT_AMBULATORY_CARE_PROVIDER_SITE_OTHER): Payer: 59

## 2021-07-13 DIAGNOSIS — I5022 Chronic systolic (congestive) heart failure: Secondary | ICD-10-CM

## 2021-07-13 DIAGNOSIS — Z95810 Presence of automatic (implantable) cardiac defibrillator: Secondary | ICD-10-CM

## 2021-07-14 ENCOUNTER — Telehealth: Payer: Self-pay

## 2021-07-14 NOTE — Progress Notes (Signed)
EPIC Encounter for ICM Monitoring  Patient Name: Mike Jimenez is a 70 y.o. male Date: 07/14/2021 Primary Care Physican: Colon Branch, MD Primary Cardiologist: Lovena Le Electrophysiologist: Santina Evans Pacing:  96.7% 06/10/2021 Weight: 245-250 lbs   Time in AT/AF     0.0 hr/day (0.0%)                                            Attempted call to patient and unable to reach.  Left detailed message per DPR regarding transmission. Transmission reviewed.    OptiVol Thoracic impedance suggesting normal fluid levels.     Prescribed:  Furosemide 20 mg 1 tablet (20 mg total) daily.   Potassium 10 mEq 1 tablet (10 mEq total) daily   Labs: 01/19/2021 Creatinine 1.22, BUN 21, Potassium 3.7, Sodium 139, GFR 60.74 A complete set of results can be found in Results Review.   Recommendations:  Left voice mail with ICM number and encouraged to call if experiencing any fluid symptoms.   Follow-up plan: ICM clinic phone appointment on 08/17/2021.   91 day device clinic remote transmission 09/22/2021.     EP/Cardiology Office Visits:  Recall 12/28/2021 with Oda Kilts, PA.     Copy of ICM check sent to Dr. Lovena Le.    3 month ICM trend: 07/13/2021.    1 Year ICM trend:       Rosalene Billings, RN 07/14/2021 11:35 AM

## 2021-07-14 NOTE — Telephone Encounter (Signed)
Remote ICM transmission received.  Attempted call to patient regarding ICM remote transmission and left detailed message per DPR.  Advised to return call for any fluid symptoms or questions. Next ICM remote transmission scheduled 08/17/2021.

## 2021-07-17 ENCOUNTER — Other Ambulatory Visit: Payer: Self-pay

## 2021-07-17 ENCOUNTER — Ambulatory Visit (HOSPITAL_COMMUNITY): Payer: 59 | Attending: Internal Medicine

## 2021-07-17 DIAGNOSIS — I5022 Chronic systolic (congestive) heart failure: Secondary | ICD-10-CM | POA: Insufficient documentation

## 2021-07-17 LAB — ECHOCARDIOGRAM COMPLETE
Area-P 1/2: 3.42 cm2
S' Lateral: 4.4 cm

## 2021-07-17 NOTE — Progress Notes (Signed)
Remote ICD transmission.   

## 2021-07-21 ENCOUNTER — Telehealth: Payer: Self-pay | Admitting: Internal Medicine

## 2021-07-21 NOTE — Telephone Encounter (Signed)
Butch Penny from [facility] was calling and requesting a copy of the patient's latest office visit notes and lab results.   Butch Penny is his Nurse Case Manager. Please fax to [phone] Attn: Butch Penny, Care Management

## 2021-07-29 IMAGING — DX DG CHEST 1V PORT
1 series · 1 of 1 positions shown · non-contrast
Comparison: Portable exam 6746 hours compared to 03/07/2016

CLINICAL DATA: Cough, fever, shortness of breath, decreased oxygen
saturation, and productive cough for 2 days, history hypertension,
MI, pacemaker

EXAM:
PORTABLE CHEST 1 VIEW

[chest]
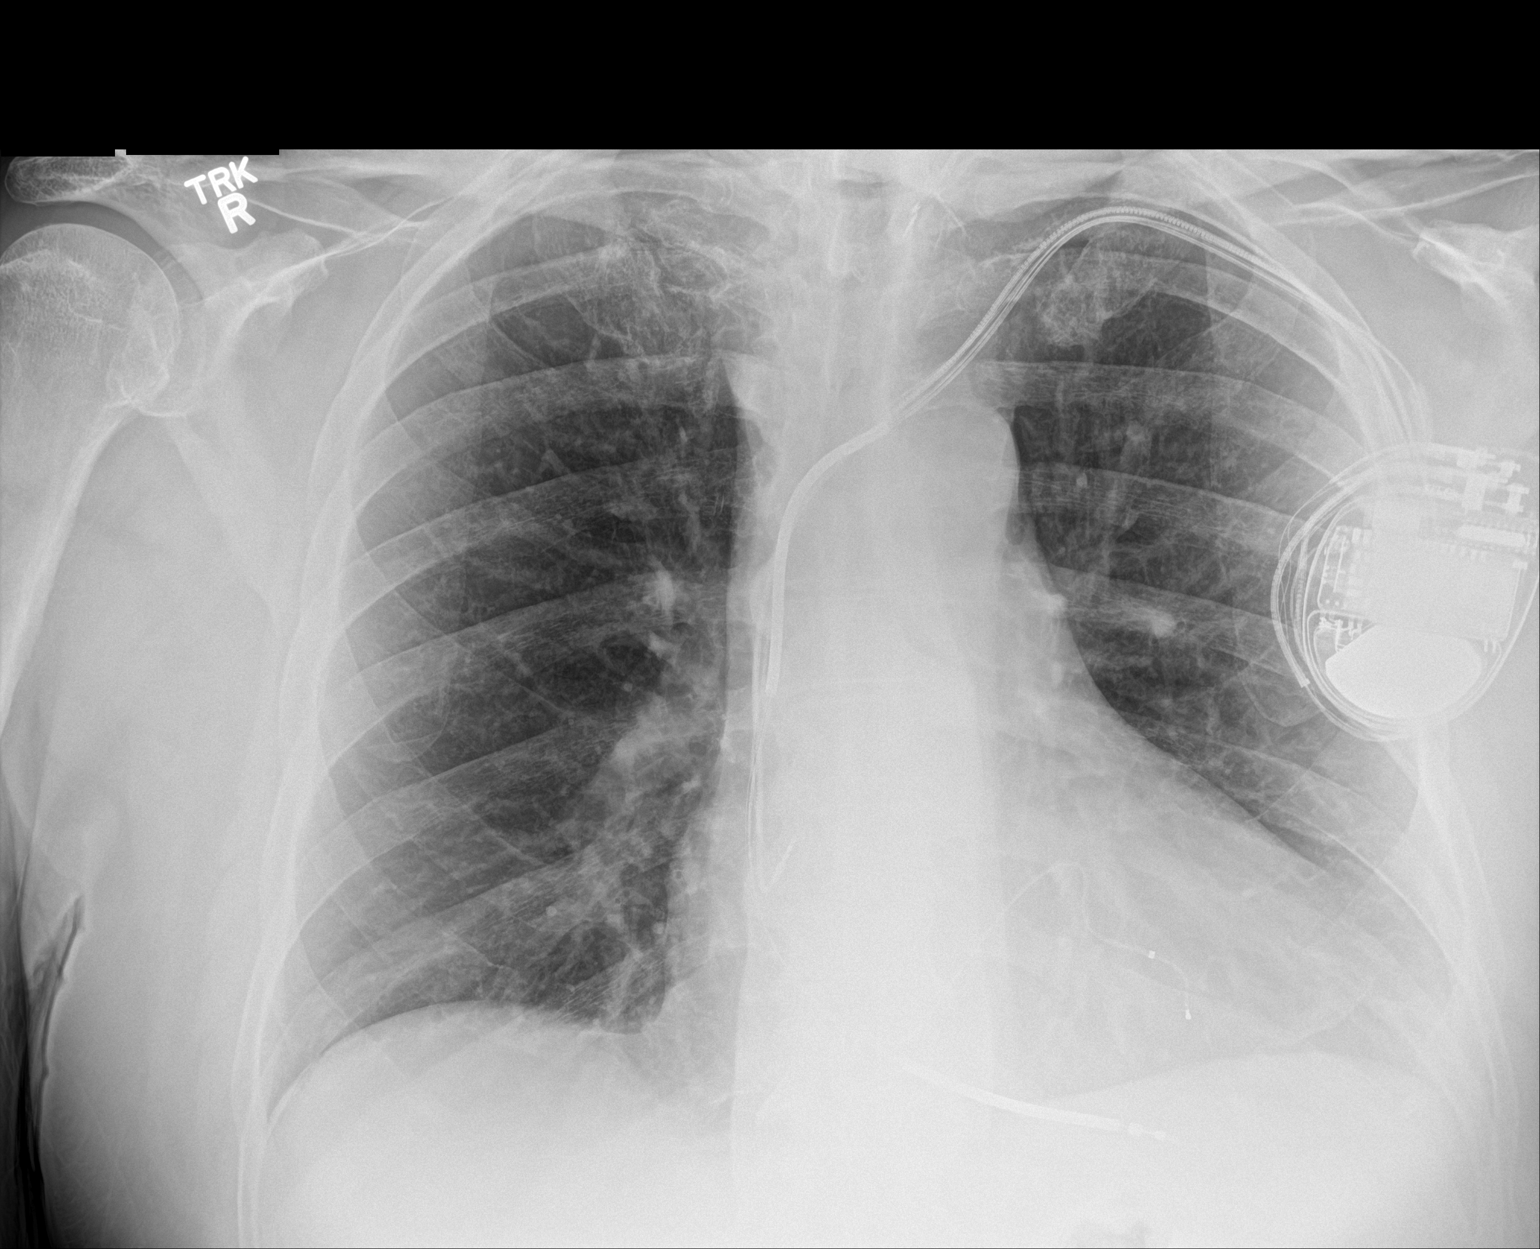

[1 of 1 positions shown; findings below may reference images not displayed]

FINDINGS: LEFT subclavian ICD with leads projecting over RIGHT atrium, RIGHT
ventricle, and coronary sinus.

Minimal enlargement of cardiac silhouette.

Mediastinal contours and pulmonary vascularity normal.

Lungs clear.

No pulmonary infiltrate, pleural effusion, or pneumothorax.

Bones unremarkable.
IMPRESSION: No acute abnormalities.

Minimal enlargement of cardiac silhouette post ICD.

## 2021-07-30 NOTE — Progress Notes (Signed)
Attempted to obtain medical history via telephone, unable to reach at this time. I left a voicemail to return pre surgical testing department's phone call.  

## 2021-08-05 ENCOUNTER — Encounter (HOSPITAL_COMMUNITY): Payer: Self-pay | Admitting: Internal Medicine

## 2021-08-05 NOTE — Anesthesia Preprocedure Evaluation (Addendum)
Anesthesia Evaluation  Patient identified by MRN, date of birth, ID band Patient awake    Reviewed: Allergy & Precautions, NPO status , Patient's Chart, lab work & pertinent test results  Airway Mallampati: III  TM Distance: >3 FB Neck ROM: Full    Dental  (+) Teeth Intact, Dental Advisory Given, Caps   Pulmonary asthma , former smoker,    Pulmonary exam normal breath sounds clear to auscultation       Cardiovascular hypertension, Pt. on medications + CAD and +CHF  + dysrhythmias Ventricular Tachycardia + Cardiac Defibrillator  Rhythm:Regular Rate:Normal  Hx/o non obstructive CM EF has somewhat normalized  S/P AICD placement 2011 for VT  Echo 07/17/21 1. Left ventricular ejection fraction, by estimation, is 50 to 55%. The left ventricle has low normal function. The left ventricle has no regional wall motion abnormalities. The left ventricular internal cavity size was  moderately dilated. There is mild left ventricular hypertrophy. Left ventricular diastolic parameters are  consistent with Grade I diastolic dysfunction (impaired relaxation).  2. Right ventricular systolic function is normal. The right ventricular  size is normal. There is normal pulmonary artery systolic pressure. The estimated right ventricular systolic pressure is 27.0 mmHg.  3. Right atrial size was mildly dilated.  4. The mitral valve is grossly normal. Trivial mitral valve  regurgitation.  5. The aortic valve is tricuspid. Aortic valve regurgitation is trivial. No aortic stenosis is present.  6. Aortic dilatation noted. There is mild dilatation of the aortic root, measuring 40 mm.  7. The inferior vena cava is normal in size with greater than 50% respiratory variability, suggesting right atrial pressure of 3 mmHg.  8. Cannot exclude small PFO by color doppler.  EKG 05/18/21 Atrial sensed ventricular paced rhythm  Hx/o LBBB   Neuro/Psych   Neuromuscular disease negative psych ROS   GI/Hepatic Neg liver ROS, PUD, GERD  Medicated and Controlled,Hx/o colon polyps   Endo/Other  diabetes, Well Controlled, Type 2, Oral Hypoglycemic AgentsHypothyroidism Hyperlipidemia Gout Hx/o papillary thyroid Ca S/P thyroidectomy and RAI  Renal/GU negative Renal ROS  negative genitourinary   Musculoskeletal  (+) Arthritis , Osteoarthritis,    Abdominal   Peds  Hematology  (+) anemia ,   Anesthesia Other Findings   Reproductive/Obstetrics                            Anesthesia Physical Anesthesia Plan  ASA: 3  Anesthesia Plan: MAC   Post-op Pain Management:    Induction: Intravenous  PONV Risk Score and Plan: 1 and Propofol infusion and Treatment may vary due to age or medical condition  Airway Management Planned: Natural Airway and Simple Face Mask  Additional Equipment:   Intra-op Plan:   Post-operative Plan:   Informed Consent: I have reviewed the patients History and Physical, chart, labs and discussed the procedure including the risks, benefits and alternatives for the proposed anesthesia with the patient or authorized representative who has indicated his/her understanding and acceptance.     Dental advisory given  Plan Discussed with: CRNA and Anesthesiologist  Anesthesia Plan Comments:        Anesthesia Quick Evaluation

## 2021-08-06 ENCOUNTER — Ambulatory Visit (HOSPITAL_COMMUNITY): Payer: 59 | Admitting: Certified Registered"

## 2021-08-06 ENCOUNTER — Encounter (HOSPITAL_COMMUNITY): Payer: Self-pay | Admitting: Internal Medicine

## 2021-08-06 ENCOUNTER — Other Ambulatory Visit: Payer: Self-pay

## 2021-08-06 ENCOUNTER — Encounter (HOSPITAL_COMMUNITY)
Admission: RE | Disposition: A | Discharge status: Home / Self Care | Payer: Self-pay | Source: Home / Self Care | Attending: Internal Medicine

## 2021-08-06 ENCOUNTER — Ambulatory Visit (HOSPITAL_COMMUNITY)
Admission: RE | Admit: 2021-08-06 | Discharge: 2021-08-06 | Disposition: A | Discharge status: Home / Self Care | Payer: 59 | Attending: Internal Medicine | Admitting: Internal Medicine

## 2021-08-06 DIAGNOSIS — Z1211 Encounter for screening for malignant neoplasm of colon: Secondary | ICD-10-CM | POA: Insufficient documentation

## 2021-08-06 DIAGNOSIS — D122 Benign neoplasm of ascending colon: Secondary | ICD-10-CM

## 2021-08-06 DIAGNOSIS — K5140 Inflammatory polyps of colon without complications: Secondary | ICD-10-CM | POA: Insufficient documentation

## 2021-08-06 DIAGNOSIS — E785 Hyperlipidemia, unspecified: Secondary | ICD-10-CM | POA: Diagnosis not present

## 2021-08-06 DIAGNOSIS — Z860100 Personal history of colon polyps, unspecified: Secondary | ICD-10-CM

## 2021-08-06 DIAGNOSIS — D125 Benign neoplasm of sigmoid colon: Secondary | ICD-10-CM

## 2021-08-06 DIAGNOSIS — R197 Diarrhea, unspecified: Secondary | ICD-10-CM

## 2021-08-06 DIAGNOSIS — Z95810 Presence of automatic (implantable) cardiac defibrillator: Secondary | ICD-10-CM | POA: Diagnosis not present

## 2021-08-06 DIAGNOSIS — Z85850 Personal history of malignant neoplasm of thyroid: Secondary | ICD-10-CM | POA: Insufficient documentation

## 2021-08-06 DIAGNOSIS — K648 Other hemorrhoids: Secondary | ICD-10-CM | POA: Insufficient documentation

## 2021-08-06 DIAGNOSIS — Z91030 Bee allergy status: Secondary | ICD-10-CM | POA: Diagnosis not present

## 2021-08-06 DIAGNOSIS — K529 Noninfective gastroenteritis and colitis, unspecified: Secondary | ICD-10-CM

## 2021-08-06 DIAGNOSIS — D128 Benign neoplasm of rectum: Secondary | ICD-10-CM | POA: Diagnosis not present

## 2021-08-06 DIAGNOSIS — D509 Iron deficiency anemia, unspecified: Secondary | ICD-10-CM | POA: Diagnosis not present

## 2021-08-06 DIAGNOSIS — Z800 Family history of malignant neoplasm of digestive organs: Secondary | ICD-10-CM | POA: Diagnosis not present

## 2021-08-06 DIAGNOSIS — Z87891 Personal history of nicotine dependence: Secondary | ICD-10-CM | POA: Diagnosis not present

## 2021-08-06 DIAGNOSIS — I447 Left bundle-branch block, unspecified: Secondary | ICD-10-CM | POA: Diagnosis not present

## 2021-08-06 DIAGNOSIS — K635 Polyp of colon: Secondary | ICD-10-CM | POA: Diagnosis not present

## 2021-08-06 DIAGNOSIS — R9430 Abnormal result of cardiovascular function study, unspecified: Secondary | ICD-10-CM

## 2021-08-06 DIAGNOSIS — D126 Benign neoplasm of colon, unspecified: Secondary | ICD-10-CM | POA: Diagnosis not present

## 2021-08-06 HISTORY — PX: BIOPSY: SHX5522

## 2021-08-06 HISTORY — PX: POLYPECTOMY: SHX5525

## 2021-08-06 HISTORY — PX: COLONOSCOPY WITH PROPOFOL: SHX5780

## 2021-08-06 LAB — GLUCOSE, CAPILLARY: Glucose-Capillary: 116 mg/dL — ABNORMAL HIGH (ref 70–99)

## 2021-08-06 SURGERY — COLONOSCOPY WITH PROPOFOL
Anesthesia: Monitor Anesthesia Care

## 2021-08-06 MED ORDER — LACTATED RINGERS IV SOLN
INTRAVENOUS | Status: AC | PRN
  Administered 2021-08-06: 10 mL/h via INTRAVENOUS

## 2021-08-06 MED ORDER — LABETALOL HCL 5 MG/ML IV SOLN
INTRAVENOUS | Status: DC | PRN
  Administered 2021-08-06 (×2): 5 mg via INTRAVENOUS

## 2021-08-06 MED ORDER — PROPOFOL 10 MG/ML IV BOLUS
INTRAVENOUS | Status: DC | PRN
  Administered 2021-08-06: 30 mg via INTRAVENOUS
  Administered 2021-08-06: 20 mg via INTRAVENOUS
  Administered 2021-08-06: 30 mg via INTRAVENOUS

## 2021-08-06 MED ORDER — PROPOFOL 500 MG/50ML IV EMUL
INTRAVENOUS | Status: AC
  Filled 2021-08-06: qty 50

## 2021-08-06 MED ORDER — SODIUM CHLORIDE 0.9 % IV SOLN: INTRAVENOUS | Status: DC

## 2021-08-06 MED ORDER — PROPOFOL 500 MG/50ML IV EMUL
INTRAVENOUS | Status: DC | PRN
  Administered 2021-08-06: 100 ug/kg/min via INTRAVENOUS

## 2021-08-06 SURGICAL SUPPLY — 22 items

## 2021-08-06 NOTE — Transfer of Care (Signed)
Immediate Anesthesia Transfer of Care Note  Patient: Mike Jimenez  Procedure(s) Performed: COLONOSCOPY WITH PROPOFOL BIOPSY POLYPECTOMY  Patient Location: PACU  Anesthesia Type:MAC  Level of Consciousness: awake and patient cooperative  Airway & Oxygen Therapy: Patient Spontanous Breathing and Patient connected to face mask  Post-op Assessment: Report given to RN and Post -op Vital signs reviewed and stable  Post vital signs: Reviewed and stable  Last Vitals:  Vitals Value Taken Time  BP    Temp    Pulse    Resp    SpO2      Last Pain:  Vitals:   08/06/21 0725  TempSrc: Oral         Complications: No notable events documented.

## 2021-08-06 NOTE — Anesthesia Procedure Notes (Signed)
Procedure Name: Intubation Date/Time: 08/06/2021 8:29 AM Performed by: Claudia Desanctis, CRNA Pre-anesthesia Checklist: Patient identified, Emergency Drugs available, Suction available and Patient being monitored Patient Re-evaluated:Patient Re-evaluated prior to induction Oxygen Delivery Method: Circle system utilized Preoxygenation: Pre-oxygenation with 100% oxygen Induction Type: IV induction Ventilation: Mask ventilation without difficulty Laryngoscope Size: 2 and Miller Grade View: Grade I Tube type: Oral Number of attempts: 1 Airway Equipment and Method: Stylet Placement Confirmation: ETT inserted through vocal cords under direct vision, positive ETCO2 and breath sounds checked- equal and bilateral Tube secured with: Tape Dental Injury: Teeth and Oropharynx as per pre-operative assessment

## 2021-08-06 NOTE — Op Note (Signed)
[facility] Patient Name: Mike Jimenez Procedure Date: 08/06/2021 MRN: [id number] Attending MD: Docia Chuck. Henrene Pastor , MD Date of Birth: 08/05/1951 CSN: [id number] Age: 70 Admit Type: Outpatient Procedure:                Colonoscopy with cold snare polypectomy x 3 Indications:              High risk colon cancer surveillance: Personal                            history of non-advanced adenoma (2011). Also with                            intermittent diarrhea Providers:                Docia Chuck. Henrene Pastor, MD, Jeanella Cara, RN,                            Tyrone Apple, Technician, Dellie Catholic Referring MD:             Kathlene November, MD Medicines:                Monitored Anesthesia Care Complications:            No immediate complications. Estimated blood loss:                            None. Estimated Blood Loss:     Estimated blood loss: none. Procedure:                Pre-Anesthesia Assessment:                           - Prior to the procedure, a History and Physical                            was performed, and patient medications and                            allergies were reviewed. The patient's tolerance of                            previous anesthesia was also reviewed. The risks                            and benefits of the procedure and the sedation                            options and risks were discussed with the patient.                            All questions were answered, and informed consent                            was obtained. Prior Anticoagulants: The patient has  taken no previous anticoagulant or antiplatelet                            agents. ASA Grade Assessment: III - A patient with                            severe systemic disease. After reviewing the risks                            and benefits, the patient was deemed in                            satisfactory condition to undergo the procedure.                            After obtaining informed consent, the colonoscope                            was passed under direct vision. Throughout the                            procedure, the patient's blood pressure, pulse, and                            oxygen saturations were monitored continuously. The                            [id number]) Olympus colonoscope was                            introduced through the anus and advanced to the the                            cecum, identified by appendiceal orifice and                            ileocecal valve. The ileocecal valve, appendiceal                            orifice, and rectum were photographed. The quality                            of the bowel preparation was excellent. The                            colonoscopy was performed without difficulty. The                            patient tolerated the procedure well. The bowel                            preparation used was SUPREP via split dose  instruction. Scope In: 8:34:10 AM Scope Out: 9:00:57 AM Scope Withdrawal Time: 0 hours 17 minutes 20 seconds  Total Procedure Duration: 0 hours 26 minutes 47 seconds  Findings:      Three polyps were found in the rectum, sigmoid colon and ascending       colon. The polyps were 2 to 3 mm in size. These polyps were removed with       a cold snare. Resection and retrieval were complete.      Internal hemorrhoids were found during retroflexion.      The exam was otherwise without abnormality on direct and retroflexion       views. Random colon biopsies were taken to rule out microscopic colitis. Impression:               - Three 2 to 3 mm polyps in the rectum, in the                            sigmoid colon and in the ascending colon, removed                            with a cold snare. Resected and retrieved.                           - Internal hemorrhoids.                           - The examination was otherwise normal  on direct                            and retroflexion views. Moderate Sedation:      none Recommendation:           - Repeat colonoscopy in 5-10 years for surveillance.                           - Patient has a contact number available for                            emergencies. The signs and symptoms of potential                            delayed complications were discussed with the                            patient. Return to normal activities tomorrow.                            Written discharge instructions were provided to the                            patient.                           - Resume previous diet.                           - Continue present medications.                           -  Await pathology results. Procedure Code(s):        --- Professional ---                           [phone], Colonoscopy, flexible; with removal of                            tumor(s), polyp(s), or other lesion(s) by snare                            technique Diagnosis Code(s):        --- Professional ---                           Z86.010, Personal history of colonic polyps                           K62.1, Rectal polyp                           K63.5, Polyp of colon                           K64.8, Other hemorrhoids CPT copyright 2019 [employer]. All rights reserved. The codes documented in this report are preliminary and upon coder review may  be revised to meet current compliance requirements. Docia Chuck. Henrene Pastor, MD 08/06/2021 9:16:55 AM This report has been signed electronically. Number of Addenda: 0

## 2021-08-06 NOTE — H&P (Signed)
HISTORY OF PRESENT ILLNESS:  Mike Jimenez is a 70 y.o. male with multiple medical problems as listed below.  He was comprehensively evaluated in the office June 05, 2021 by the GI physician assistant.  See that dictation for details.  There have been no interval clinical changes or changes in physical exam.  He has had interval cardiac work-up with cardiac clearance.  Actually improvement in his ejection fraction on echo which is 50 to 55%.  He tolerated his prep well.  Last colonoscopy 2011 with tubular adenoma and diverticulosis.  He also reports intermittent problems with diarrhea  REVIEW OF SYSTEMS:  All non-GI ROS negative. Past Medical History:  Diagnosis Date   Anemia    Arthritis    "knees" (09/05/2014)   Automatic implantable cardioverter-defibrillator in situ    CAD (coronary artery disease)    a. Mild nonobstructive CAD by cath 08/2014.   CHF (congestive heart failure) ([place])    Childhood asthma    Gastric ulcer 2011   GERD (gastroesophageal reflux disease)    History of gout    Hyperlipidemia    Hypertension    LBBB (left bundle branch block)    NICM (nonischemic cardiomyopathy) ([place])    a. Dx 2011 - presented with sustained VT. Normal coronaries at that time, EF 10-15% by cath and 20-25% by echo. b. s/p biventricular ICD 2011. c. CP 08/2014: mild nonobstructive CAD.   Obesity    Postsurgical hypothyroidism    Thyroid cancer ([place])    a. 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 Dr Harlow Asa. s/p thyroidectomy 2013.   Torn meniscus    Type II diabetes mellitus ([place])    Ventricular tachycardia ([place])    a. Sustained VT 2011 s/p MDT [id number] Concert BiV ICD, ser# [id number] H.    Past Surgical History:  Procedure Laterality Date   BI-VENTRICULAR IMPLANTABLE CARDIOVERTER DEFIBRILLATOR  (CRT-D)  03/2010   CARDIAC CATHETERIZATION  03/2010   EP IMPLANTABLE DEVICE N/A 05/21/2016   Procedure:  ICD Generator Changeout;  Surgeon: Evans Lance, MD;  Location: [facility] [place] [facility]  [facility];  Service: Cardiovascular;  Laterality: N/A;   LEFT HEART CATHETERIZATION WITH CORONARY ANGIOGRAM N/A 09/06/2014   Procedure: LEFT HEART CATHETERIZATION WITH CORONARY ANGIOGRAM;  Surgeon: Burnell Blanks, MD;  Location: [facility] CATH LAB;  Service: Cardiovascular;  Laterality: N/A;   PTX     traumatic, age 37 months   THYROIDECTOMY  12/10/2011   Procedure: TOTAL THYROIDECTOMY WITH CENTRAL COMPARTMENT DISSECTION;  Surgeon: Earnstine Regal, MD;  Location: [place];  Service: General;  Laterality: N/A;  Total thyroidectomy with limited central compartment lymph node dissection.   VASECTOMY      Social History Mike Jimenez  reports that he quit smoking about 50 years ago. His smoking use included cigarettes. He has a 5.00 pack-year smoking history. He has quit using smokeless tobacco.  His smokeless tobacco use included chew. He reports current alcohol use. He reports that he does not use drugs.  family history includes Asthma in his sister; Atrial fibrillation in his father; Colon cancer in his maternal aunt and maternal aunt; Diabetes in his maternal aunt and maternal grandfather; Hypertension in his father and mother; Hypothyroidism in his daughter and mother; Lung cancer in his maternal uncle and paternal uncle.  Allergies  Allergen Reactions   Bee Venom Swelling   Ramipril Cough       PHYSICAL EXAMINATION:  Vital signs: BP (!) 193/90   Pulse 69   Temp 97.8 F (36.6  C) (Oral)   Resp 16   Ht '6\' 2"'$  (1.88 m)   Wt 113.4 kg   SpO2 98%   BMI 32.10 kg/m  General: Well-developed, well-nourished, no acute distress HEENT: Sclerae are anicteric, conjunctiva pink. Oral mucosa intact Lungs: Clear Heart: Regular Abdomen: soft, nontender, nondistended, no obvious ascites, no peritoneal signs, normal bowel sounds. No organomegaly. Extremities: No edema Psychiatric: alert and oriented x3. Cooperative     ASSESSMENT:  1.  History of nonadvanced adenoma 2011.  Now for surveillance  colonoscopy 2.  Intermittent diarrhea   PLAN:  1.  Colonoscopy with biopsies.

## 2021-08-06 NOTE — Anesthesia Postprocedure Evaluation (Signed)
Anesthesia Post Note  Patient: Mike Jimenez  Procedure(s) Performed: COLONOSCOPY WITH PROPOFOL BIOPSY POLYPECTOMY     Patient location during evaluation: PACU Anesthesia Type: MAC Level of consciousness: awake and alert and oriented Pain management: pain level controlled Vital Signs Assessment: post-procedure vital signs reviewed and stable Respiratory status: spontaneous breathing, nonlabored ventilation and respiratory function stable Cardiovascular status: stable and blood pressure returned to baseline Postop Assessment: no apparent nausea or vomiting Anesthetic complications: no   No notable events documented.  Last Vitals:  Vitals:   08/06/21 0725 08/06/21 0910  BP: (!) 193/90 (!) 161/92  Pulse: 69 66  Resp: 16 17  Temp: 36.6 C 36.5 C  SpO2: 98% 100%    Last Pain:  Vitals:   08/06/21 0910  TempSrc: Oral                 Lashena Signer A.

## 2021-08-06 NOTE — Discharge Instructions (Signed)

## 2021-08-07 ENCOUNTER — Encounter (HOSPITAL_COMMUNITY): Payer: Self-pay | Admitting: Internal Medicine

## 2021-08-07 LAB — SURGICAL PATHOLOGY

## 2021-08-17 ENCOUNTER — Ambulatory Visit (INDEPENDENT_AMBULATORY_CARE_PROVIDER_SITE_OTHER): Payer: 59

## 2021-08-17 DIAGNOSIS — Z95810 Presence of automatic (implantable) cardiac defibrillator: Secondary | ICD-10-CM

## 2021-08-17 DIAGNOSIS — I5022 Chronic systolic (congestive) heart failure: Secondary | ICD-10-CM | POA: Diagnosis not present

## 2021-08-18 ENCOUNTER — Telehealth: Payer: Self-pay

## 2021-08-18 NOTE — Progress Notes (Signed)
EPIC Encounter for ICM Monitoring  Patient Name: Mike Jimenez is a 70 y.o. male Date: 08/18/2021 Primary Care Physican: Colon Branch, MD Primary Cardiologist: Lovena Le Electrophysiologist: Santina Evans Pacing:  96.9% 06/10/2021 Weight: 245-250 lbs   Time in AT/AF     0.0 hr/day (0.0%)                                            Attempted call to patient and unable to reach.  Left detailed message per DPR regarding transmission. Transmission reviewed.    OptiVol Thoracic impedance suggesting normal fluid levels.   Impedance was suggesting fluctuation of fluid levels with possible fluid accumulation from 8/16-9/1.   Prescribed:  Furosemide 20 mg 1 tablet (20 mg total) daily.   Potassium 10 mEq 1 tablet (10 mEq total) daily   Labs: 01/19/2021 Creatinine 1.22, BUN 21, Potassium 3.7, Sodium 139, GFR 60.74 A complete set of results can be found in Results Review.   Recommendations:  Left voice mail with ICM number and encouraged to call if experiencing any fluid symptoms.   Follow-up plan: ICM clinic phone appointment on 09/23/2021.   91 day device clinic remote transmission 09/22/2021.     EP/Cardiology Office Visits:  Recall 12/28/2021 with Oda Kilts, PA.     Copy of ICM check sent to Dr. Lovena Le.   3 month ICM trend: 08/17/2021.    1 Year ICM trend:       Rosalene Billings, RN 08/18/2021 4:21 PM

## 2021-08-18 NOTE — Telephone Encounter (Signed)
Remote ICM transmission received.  Attempted call to patient regarding ICM remote transmission and left detailed message per DPR.  Advised to return call for any fluid symptoms or questions. Next ICM remote transmission scheduled 09/23/2021.

## 2021-08-20 DIAGNOSIS — Z7984 Long term (current) use of oral hypoglycemic drugs: Secondary | ICD-10-CM | POA: Diagnosis not present

## 2021-08-20 DIAGNOSIS — E890 Postprocedural hypothyroidism: Secondary | ICD-10-CM | POA: Diagnosis not present

## 2021-08-20 DIAGNOSIS — Z85850 Personal history of malignant neoplasm of thyroid: Secondary | ICD-10-CM | POA: Diagnosis not present

## 2021-08-20 DIAGNOSIS — E119 Type 2 diabetes mellitus without complications: Secondary | ICD-10-CM | POA: Diagnosis not present

## 2021-08-20 LAB — TSH: TSH: 1.2 (ref <=5.90)

## 2021-08-20 LAB — HEMOGLOBIN A1C: Hemoglobin A1C: 7.2

## 2021-08-27 ENCOUNTER — Encounter: Payer: Self-pay | Admitting: Internal Medicine

## 2021-08-28 ENCOUNTER — Other Ambulatory Visit (HOSPITAL_BASED_OUTPATIENT_CLINIC_OR_DEPARTMENT_OTHER): Payer: Self-pay

## 2021-08-28 ENCOUNTER — Other Ambulatory Visit: Payer: Self-pay

## 2021-08-28 ENCOUNTER — Ambulatory Visit: Payer: 59 | Attending: Internal Medicine

## 2021-08-28 DIAGNOSIS — Z23 Encounter for immunization: Secondary | ICD-10-CM

## 2021-08-28 MED ORDER — PFIZER COVID-19 VAC BIVALENT 30 MCG/0.3ML IM SUSP
INTRAMUSCULAR | 0 refills | Status: DC
  Filled 2021-08-28: qty 0.3, 1d supply, fill #0

## 2021-08-28 MED ORDER — FLUAD QUADRIVALENT 0.5 ML IM PRSY
PREFILLED_SYRINGE | INTRAMUSCULAR | 0 refills | Status: DC
  Filled 2021-08-28: qty 0.5, 1d supply, fill #0

## 2021-08-28 NOTE — Progress Notes (Signed)
   Covid-19 Vaccination Clinic  Name:  Mike Jimenez    MRN: [id number] DOB: August 21, 1951  08/28/2021  Mike Jimenez was observed post Covid-19 immunization for 15 minutes without incident. He was provided with Vaccine Information Sheet and instruction to access the V-Safe system.   Mike Jimenez was instructed to call 911 with any severe reactions post vaccine: Difficulty breathing  Swelling of face and throat  A fast heartbeat  A bad rash all over body  Dizziness and weakness

## 2021-09-02 ENCOUNTER — Other Ambulatory Visit: Payer: Self-pay | Admitting: Internal Medicine

## 2021-09-22 ENCOUNTER — Ambulatory Visit (INDEPENDENT_AMBULATORY_CARE_PROVIDER_SITE_OTHER): Payer: 59

## 2021-09-22 DIAGNOSIS — I428 Other cardiomyopathies: Secondary | ICD-10-CM | POA: Diagnosis not present

## 2021-09-22 LAB — CUP PACEART REMOTE DEVICE CHECK
Battery Remaining Longevity: 25 mo
Battery Voltage: 2.93 V
Brady Statistic AP VP Percent: 0.44 %
Brady Statistic AP VS Percent: 0.02 %
Brady Statistic AS VP Percent: 97.99 %
Brady Statistic AS VS Percent: 1.55 %
Brady Statistic RA Percent Paced: 0.46 %
Brady Statistic RV Percent Paced: 95.86 %
Date Time Interrogation Session: 20221025001806
HighPow Impedance: 44 Ohm
HighPow Impedance: 55 Ohm
Implantable Lead Implant Date: 20110516
Implantable Lead Implant Date: 20110516
Implantable Lead Implant Date: 20110516
Implantable Lead Location: 753858
Implantable Lead Location: 753859
Implantable Lead Location: 753860
Implantable Lead Model: 5076
Implantable Lead Model: 6947
Implantable Pulse Generator Implant Date: 20170623
Lead Channel Impedance Value: 285 Ohm
Lead Channel Impedance Value: 323 Ohm
Lead Channel Impedance Value: 380 Ohm
Lead Channel Impedance Value: 399 Ohm
Lead Channel Impedance Value: 570 Ohm
Lead Channel Impedance Value: 836 Ohm
Lead Channel Pacing Threshold Amplitude: 0.875 V
Lead Channel Pacing Threshold Amplitude: 1.125 V
Lead Channel Pacing Threshold Amplitude: 1.25 V
Lead Channel Pacing Threshold Pulse Width: 0.4 ms
Lead Channel Pacing Threshold Pulse Width: 0.4 ms
Lead Channel Pacing Threshold Pulse Width: 0.4 ms
Lead Channel Sensing Intrinsic Amplitude: 1.875 mV
Lead Channel Sensing Intrinsic Amplitude: 1.875 mV
Lead Channel Sensing Intrinsic Amplitude: 7.375 mV
Lead Channel Sensing Intrinsic Amplitude: 7.375 mV
Lead Channel Setting Pacing Amplitude: 2.25 V
Lead Channel Setting Pacing Amplitude: 2.5 V
Lead Channel Setting Pacing Amplitude: 2.5 V
Lead Channel Setting Pacing Pulse Width: 0.4 ms
Lead Channel Setting Pacing Pulse Width: 0.4 ms
Lead Channel Setting Sensing Sensitivity: 0.3 mV

## 2021-09-23 ENCOUNTER — Ambulatory Visit (INDEPENDENT_AMBULATORY_CARE_PROVIDER_SITE_OTHER): Payer: 59

## 2021-09-23 DIAGNOSIS — I5022 Chronic systolic (congestive) heart failure: Secondary | ICD-10-CM

## 2021-09-23 DIAGNOSIS — Z95810 Presence of automatic (implantable) cardiac defibrillator: Secondary | ICD-10-CM

## 2021-09-25 ENCOUNTER — Telehealth: Payer: Self-pay

## 2021-09-25 NOTE — Progress Notes (Signed)
EPIC Encounter for ICM Monitoring  Patient Name: Mike Jimenez is a 70 y.o. male Date: 09/25/2021 Primary Care Physican: Colon Branch, MD Primary Cardiologist: Lovena Le Electrophysiologist: Santina Evans Pacing:  96.7% 06/10/2021 Weight: 245-250 lbs   Time in AT/AF     0.0 hr/day (0.0%)                                            Attempted call to patient and unable to reach.  Left detailed message per DPR regarding transmission. Transmission reviewed.    OptiVol Thoracic impedance suggesting normal fluid levels.     Prescribed:  Furosemide 20 mg 1 tablet (20 mg total) daily.   Potassium 10 mEq 1 tablet (10 mEq total) daily   Labs: 01/19/2021 Creatinine 1.22, BUN 21, Potassium 3.7, Sodium 139, GFR 60.74 A complete set of results can be found in Results Review.   Recommendations:  Left voice mail with ICM number and encouraged to call if experiencing any fluid symptoms.   Follow-up plan: ICM clinic phone appointment on 09/02/2021.   91 day device clinic remote transmission 12/22/2021.     EP/Cardiology Office Visits:  Recall 12/28/2021 with Oda Kilts, PA.     Copy of ICM check sent to Dr. Lovena Le.    3 month ICM trend: 09/22/2021.    1 Year ICM trend:       Rosalene Billings, RN 09/25/2021 2:52 PM

## 2021-09-25 NOTE — Telephone Encounter (Signed)
Remote ICM transmission received.  Attempted call to patient regarding ICM remote transmission and left detailed message per DPR.  Advised to return call for any fluid symptoms or questions. Next ICM remote transmission scheduled 11/02/2021.

## 2021-09-30 NOTE — Progress Notes (Signed)
Remote ICD transmission.   

## 2021-10-08 ENCOUNTER — Other Ambulatory Visit: Payer: Self-pay | Admitting: Internal Medicine

## 2021-10-11 ENCOUNTER — Encounter: Payer: Self-pay | Admitting: Internal Medicine

## 2021-10-13 ENCOUNTER — Other Ambulatory Visit: Payer: Self-pay | Admitting: Internal Medicine

## 2021-10-13 LAB — HM DIABETES FOOT EXAM: HM Diabetic Foot Exam: NORMAL

## 2021-10-13 MED ORDER — EMPAGLIFLOZIN 10 MG PO TABS: 10.0000 mg | ORAL_TABLET | Freq: Every day | ORAL | 1 refills | Status: DC

## 2021-11-02 ENCOUNTER — Ambulatory Visit (INDEPENDENT_AMBULATORY_CARE_PROVIDER_SITE_OTHER): Payer: HMO

## 2021-11-02 DIAGNOSIS — Z95810 Presence of automatic (implantable) cardiac defibrillator: Secondary | ICD-10-CM | POA: Diagnosis not present

## 2021-11-02 DIAGNOSIS — I5022 Chronic systolic (congestive) heart failure: Secondary | ICD-10-CM | POA: Diagnosis not present

## 2021-11-04 ENCOUNTER — Telehealth: Payer: Self-pay

## 2021-11-04 NOTE — Telephone Encounter (Signed)
Remote ICM transmission received.  Attempted call to patient regarding ICM remote transmission and left detailed message per DPR.  Advised to return call for any fluid symptoms or questions. Next ICM remote transmission scheduled 12/07/2021.    

## 2021-11-04 NOTE — Progress Notes (Signed)
EPIC Encounter for ICM Monitoring  Patient Name: Mike Jimenez is a 70 y.o. male Date: 11/04/2021 Primary Care Physican: Colon Branch, MD Primary Cardiologist: Lovena Le Electrophysiologist: Santina Evans Pacing:  98.3% Last Weight: 245-250 lbs   Time in AT/AF     0.0 hr/day (0.0%)                                            Attempted call to patient and unable to reach.  Left detailed message per DPR regarding transmission. Transmission reviewed.    OptiVol Thoracic impedance suggesting normal fluid levels.     Prescribed:  Furosemide 20 mg 1 tablet (20 mg total) daily.   Potassium 10 mEq 1 tablet (10 mEq total) daily   Labs: 01/19/2021 Creatinine 1.22, BUN 21, Potassium 3.7, Sodium 139, GFR 60.74 A complete set of results can be found in Results Review.   Recommendations:  Left voice mail with ICM number and encouraged to call if experiencing any fluid symptoms.   Follow-up plan: ICM clinic phone appointment on 12/07/2021.   91 day device clinic remote transmission 12/22/2021.     EP/Cardiology Office Visits:  Recall 12/28/2021 with Oda Kilts, PA.     Copy of ICM check sent to Dr. Lovena Le.     3 month ICM trend: 11/02/2021.    12-14 Month ICM trend:       Rosalene Billings, RN 11/04/2021 9:40 AM

## 2021-11-18 ENCOUNTER — Encounter: Payer: Self-pay | Admitting: Internal Medicine

## 2021-12-03 ENCOUNTER — Other Ambulatory Visit: Payer: Self-pay | Admitting: Internal Medicine

## 2021-12-07 ENCOUNTER — Ambulatory Visit (INDEPENDENT_AMBULATORY_CARE_PROVIDER_SITE_OTHER): Payer: HMO

## 2021-12-07 DIAGNOSIS — Z95810 Presence of automatic (implantable) cardiac defibrillator: Secondary | ICD-10-CM | POA: Diagnosis not present

## 2021-12-07 DIAGNOSIS — I5022 Chronic systolic (congestive) heart failure: Secondary | ICD-10-CM | POA: Diagnosis not present

## 2021-12-09 ENCOUNTER — Telehealth: Payer: Self-pay

## 2021-12-09 NOTE — Progress Notes (Signed)
EPIC Encounter for ICM Monitoring  Patient Name: Mike Jimenez is a 71 y.o. male Date: 12/09/2021 Primary Care Physican: Colon Branch, MD Primary Cardiologist: Lovena Le Electrophysiologist: Santina Evans Pacing:  98.3% Last Weight: 245-250 lbs   Time in AT/AF     0.0 hr/day (0.0%)                                            Attempted call to patient and unable to reach.  Left detailed message per DPR regarding transmission. Transmission reviewed.    OptiVol Thoracic impedance suggesting normal fluid levels.     Prescribed:  Furosemide 20 mg 1 tablet (20 mg total) daily.   Potassium 10 mEq 1 tablet (10 mEq total) daily   Labs: 01/19/2021 Creatinine 1.22, BUN 21, Potassium 3.7, Sodium 139, GFR 60.74 A complete set of results can be found in Results Review.   Recommendations:  Left voice mail with ICM number and encouraged to call if experiencing any fluid symptoms.   Follow-up plan: ICM clinic phone appointment on 01/11/2022.   91 day device clinic remote transmission 12/22/2021.     EP/Cardiology Office Visits:  Recall 12/28/2021 with Oda Kilts, PA.     Copy of ICM check sent to Dr. Lovena Le.   3 month ICM trend: 12/07/2021.    12-14 Month ICM trend:     Rosalene Billings, RN 12/09/2021 1:32 PM

## 2021-12-09 NOTE — Telephone Encounter (Signed)
Remote ICM transmission received.  Attempted call to patient regarding ICM remote transmission and left detailed message per DPR.  Advised to return call for any fluid symptoms or questions. Next ICM remote transmission scheduled 01/11/2022.

## 2021-12-23 ENCOUNTER — Ambulatory Visit (INDEPENDENT_AMBULATORY_CARE_PROVIDER_SITE_OTHER): Payer: HMO

## 2021-12-23 DIAGNOSIS — I428 Other cardiomyopathies: Secondary | ICD-10-CM | POA: Diagnosis not present

## 2021-12-23 LAB — CUP PACEART REMOTE DEVICE CHECK
Battery Remaining Longevity: 24 mo
Battery Voltage: 2.92 V
Brady Statistic AP VP Percent: 0.32 %
Brady Statistic AP VS Percent: 0.02 %
Brady Statistic AS VP Percent: 98.26 %
Brady Statistic AS VS Percent: 1.41 %
Brady Statistic RA Percent Paced: 0.34 %
Brady Statistic RV Percent Paced: 97.6 %
Date Time Interrogation Session: 20230125033326
HighPow Impedance: 44 Ohm
HighPow Impedance: 56 Ohm
Implantable Lead Implant Date: 20110516
Implantable Lead Implant Date: 20110516
Implantable Lead Implant Date: 20110516
Implantable Lead Location: 753858
Implantable Lead Location: 753859
Implantable Lead Location: 753860
Implantable Lead Model: 5076
Implantable Lead Model: 6947
Implantable Pulse Generator Implant Date: 20170623
Lead Channel Impedance Value: 266 Ohm
Lead Channel Impedance Value: 323 Ohm
Lead Channel Impedance Value: 399 Ohm
Lead Channel Impedance Value: 399 Ohm
Lead Channel Impedance Value: 570 Ohm
Lead Channel Impedance Value: 874 Ohm
Lead Channel Pacing Threshold Amplitude: 0.75 V
Lead Channel Pacing Threshold Amplitude: 1 V
Lead Channel Pacing Threshold Amplitude: 1.375 V
Lead Channel Pacing Threshold Pulse Width: 0.4 ms
Lead Channel Pacing Threshold Pulse Width: 0.4 ms
Lead Channel Pacing Threshold Pulse Width: 0.4 ms
Lead Channel Sensing Intrinsic Amplitude: 1.875 mV
Lead Channel Sensing Intrinsic Amplitude: 1.875 mV
Lead Channel Sensing Intrinsic Amplitude: 8.875 mV
Lead Channel Sensing Intrinsic Amplitude: 8.875 mV
Lead Channel Setting Pacing Amplitude: 2 V
Lead Channel Setting Pacing Amplitude: 2.5 V
Lead Channel Setting Pacing Amplitude: 2.75 V
Lead Channel Setting Pacing Pulse Width: 0.4 ms
Lead Channel Setting Pacing Pulse Width: 0.4 ms
Lead Channel Setting Sensing Sensitivity: 0.3 mV

## 2021-12-31 ENCOUNTER — Encounter: Payer: Self-pay | Admitting: Internal Medicine

## 2021-12-31 ENCOUNTER — Ambulatory Visit (INDEPENDENT_AMBULATORY_CARE_PROVIDER_SITE_OTHER): Payer: HMO | Admitting: Internal Medicine

## 2021-12-31 VITALS — BP 138/80 | HR 72 | Temp 98.3°F | Resp 16 | Ht 74.0 in | Wt 253.2 lb

## 2021-12-31 DIAGNOSIS — E785 Hyperlipidemia, unspecified: Secondary | ICD-10-CM | POA: Diagnosis not present

## 2021-12-31 DIAGNOSIS — Z0000 Encounter for general adult medical examination without abnormal findings: Secondary | ICD-10-CM

## 2021-12-31 DIAGNOSIS — E118 Type 2 diabetes mellitus with unspecified complications: Secondary | ICD-10-CM

## 2021-12-31 DIAGNOSIS — M1A9XX0 Chronic gout, unspecified, without tophus (tophi): Secondary | ICD-10-CM | POA: Diagnosis not present

## 2021-12-31 DIAGNOSIS — I10 Essential (primary) hypertension: Secondary | ICD-10-CM

## 2021-12-31 LAB — COMPREHENSIVE METABOLIC PANEL
ALT: 17 U/L (ref 0–53)
AST: 19 U/L (ref 0–37)
Albumin: 4.6 g/dL (ref 3.5–5.2)
Alkaline Phosphatase: 82 U/L (ref 39–117)
BUN: 22 mg/dL (ref 6–23)
CO2: 29 mEq/L (ref 19–32)
Calcium: 9.6 mg/dL (ref 8.4–10.5)
Chloride: 101 mEq/L (ref 96–112)
Creatinine, Ser: 1.12 mg/dL (ref 0.40–1.50)
GFR: 66.84 mL/min (ref >60.00)
Glucose, Bld: 135 mg/dL — ABNORMAL HIGH (ref 70–99)
Potassium: 4.3 mEq/L (ref 3.5–5.1)
Sodium: 142 mEq/L (ref 135–145)
Total Bilirubin: 0.8 mg/dL (ref 0.2–1.2)
Total Protein: 7.5 g/dL (ref 6.0–8.3)

## 2021-12-31 LAB — LIPID PANEL
Cholesterol: 138 mg/dL (ref 0–200)
HDL: 36.2 mg/dL — ABNORMAL LOW (ref >39.00)
NonHDL: 101.49
Total CHOL/HDL Ratio: 4
Triglycerides: 214 mg/dL — ABNORMAL HIGH (ref 0.0–149.0)
VLDL: 42.8 mg/dL — ABNORMAL HIGH (ref 0.0–40.0)

## 2021-12-31 LAB — URIC ACID: Uric Acid, Serum: 7.1 mg/dL (ref 4.0–7.8)

## 2021-12-31 LAB — PSA: PSA: 1.16 ng/mL (ref 0.10–4.00)

## 2021-12-31 LAB — LDL CHOLESTEROL, DIRECT: Direct LDL: 79 mg/dL

## 2021-12-31 NOTE — Assessment & Plan Note (Signed)
-   Td 11-2021 - PNM 23: 2011-2018--2021 Prevnar 2015 - zostavax 2016; s/p shingrix  -COVID VAX >> UTD - had a flu shot    CCS:  Colonoscopy 9- 2011 4 polyps, s/p Cscope 07-2021 -Prostate cancer screening:  DRE normal, no symptoms, check PSA -Diet: Improved since the last visit.  He retired, has more time for himself, exercising 3 times a week. -Labs: CMP, FLP, PSA -ACP information provided.

## 2021-12-31 NOTE — Patient Instructions (Signed)
Check the  blood pressure regularly BP GOAL is between 110/65 and  135/85. If it is consistently higher or lower, let me know     GO TO THE LAB : Get the blood work     GO TO THE FRONT DESK, PLEASE SCHEDULE YOUR APPOINTMENTS Come back for a physical exam in 1 year    "Living will", "Health Care Power of attorney": Advanced care planning  (If you already have a living will or healthcare power of attorney, please bring the copy to be scanned in your chart.)  Advance care planning is a process that supports adults in  understanding and sharing their preferences regarding future medical care.   The patient's preferences are recorded in documents called Advance Directives.    Advanced directives are completed (and can be modified at any time) while the patient is in full mental capacity.   The documentation should be available at all times to the patient, the family and the healthcare providers.  Bring in a copy to be scanned in your chart is an excellent idea and is recommended   This legal documents direct treatment decision making and/or appoint a surrogate to make the decision if the patient is not capable to do so.    Advance directives can be documented in many types of formats,  documents have names such as:  Lliving will  Durable power of attorney for healthcare (healthcare proxy or healthcare power of attorney)  Combined directives  Physician orders for life-sustaining treatment    More information at:  [email or web address] 

## 2021-12-31 NOTE — Progress Notes (Signed)
Subjective:    Patient ID: Mike Jimenez, male    DOB: [birth year], 71 y.o.   MRN: [id number]  DOS:  12/31/2021 Type of visit - description: here for CPX  Since the last office visit is doing well.  Has no major concerns. Neuropathy symptoms mild stable  Review of Systems  Other than above, a 14 point review of systems is negative       Past Medical History:  Diagnosis Date   Anemia    Arthritis    "knees" (09/05/2014)   Automatic implantable cardioverter-defibrillator in situ    CAD (coronary artery disease)    a. Mild nonobstructive CAD by cath 08/2014.   CHF (congestive heart failure) ([place])    Childhood asthma    Gastric ulcer 2011   GERD (gastroesophageal reflux disease)    History of gout    Hyperlipidemia    Hypertension    LBBB (left bundle branch block)    NICM (nonischemic cardiomyopathy) ([place])    a. Dx 2011 - presented with sustained VT. Normal coronaries at that time, EF 10-15% by cath and 20-25% by echo. b. s/p biventricular ICD 2011. c. CP 08/2014: mild nonobstructive CAD.   Obesity    Postsurgical hypothyroidism    Thyroid cancer ([place])    a. 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 Dr Harlow Asa. s/p thyroidectomy 2013.   Torn meniscus    Type II diabetes mellitus ([facility])    Ventricular tachycardia    a. Sustained VT 2011 s/p MDT [id number] Concert BiV ICD, ser# [id number] H.    Past Surgical History:  Procedure Laterality Date   BI-VENTRICULAR IMPLANTABLE CARDIOVERTER DEFIBRILLATOR  (CRT-D)  03/2010   BIOPSY  08/06/2021   Procedure: BIOPSY;  Surgeon: Irene Shipper, MD;  Location: [facility];  Service: Endoscopy;;   CARDIAC CATHETERIZATION  03/2010   COLONOSCOPY WITH PROPOFOL N/A 08/06/2021   Procedure: COLONOSCOPY WITH PROPOFOL;  Surgeon: Irene Shipper, MD;  Location: [facility];  Service: Endoscopy;  Laterality: N/A;   EP IMPLANTABLE DEVICE N/A 05/21/2016   Procedure:  ICD Generator Changeout;  Surgeon: Evans Lance, MD;  Location: [place] [facility];   Service: Cardiovascular;  Laterality: N/A;   LEFT HEART CATHETERIZATION WITH CORONARY ANGIOGRAM N/A 09/06/2014   Procedure: LEFT HEART CATHETERIZATION WITH CORONARY ANGIOGRAM;  Surgeon: Burnell Blanks, MD;  Location[facility] CATH [facility];  Service: Cardiovascular;  Laterality: N/A;   POLYPECTOMY  08/06/2021   Procedure: POLYPECTOMY;  Surgeon: Irene Shipper, MD;  Location: [facility];  Service: Endoscopy;;   PTX     traumatic, age 15 months   THYROIDECTOMY  12/10/2011   Procedure: TOTAL THYROIDECTOMY WITH CENTRAL COMPARTMENT DISSECTION;  Surgeon: Earnstine Regal, MD;  Location: [place];  Service: General;  Laterality: N/A;  Total thyroidectomy with limited central compartment lymph node dissection.   VASECTOMY     Social History   Socioeconomic History   Marital status: Married    Spouse name: Not on file   Number of children: 2   Years of education: Not on file   Highest education level: Not on file  Occupational History   Occupation: [employer]   Occupation: to retire 2023  Tobacco Use   Smoking status: Former    Packs/day: 2.50    Years: 2.00    Pack years: 5.00    Types: Cigarettes    Quit date: 11/29/1970    Years since quitting: 51.1   Smokeless tobacco: Former    Types: Loss adjuster, chartered  Use  ° Vaping Use: Never used  °Substance and Sexual Activity  ° Alcohol use: Yes  °  Comment: drinks rarely  ° Drug use: No  ° Sexual activity: Yes  °Other Topics Concern  ° Not on file  °Social History Narrative  ° Live w/ wife , daughter [name] lives in [place]  ° 9 dogs, fish pond.  ° Job is  physical.  ° Does not routinely exercise.  ° °Social Determinants of Health  ° °Financial Resource Strain: Low Risk   ° Difficulty of Paying Living Expenses: Not hard at all  °Food Insecurity: No Food Insecurity  ° Worried About Running Out of Food in the Last Year: Never true  ° Ran Out of Food in the Last Year: Never true  °Transportation Needs: No Transportation Needs  ° Lack of Transportation (Medical): No  ° Lack of  Transportation (Non-Medical): No  °Physical Activity: Inactive  ° Days of Exercise per Week: 0 days  ° Minutes of Exercise per Session: 0 min  °Stress: No Stress Concern Present  ° Feeling of Stress : Not at all  °Social Connections: Moderately Isolated  ° Frequency of Communication with Friends and Family: More than three times a week  ° Frequency of Social Gatherings with Friends and Family: More than three times a week  ° Attends Religious Services: Never  ° Active Member of Clubs or Organizations: No  ° Attends Club or Organization Meetings: Never  ° Marital Status: Married  °Intimate Partner Violence: Not At Risk  ° Fear of Current or Ex-Partner: No  ° Emotionally Abused: No  ° Physically Abused: No  ° Sexually Abused: No  ° ° °Current Outpatient Medications  °Medication Instructions  ° ACCU-CHEK GUIDE test strip TEST BLOOD SUGAR ONCE DAILY  ° acetaminophen (TYLENOL) 1,000 mg, Oral, Every 6 hours PRN  ° allopurinol (ZYLOPRIM) 100 mg, Oral, Daily  ° aspirin EC 81 mg, Oral, Daily  ° atorvastatin (LIPITOR) 40 mg, Oral, Daily  ° Blood Glucose Monitoring Suppl (ACCU-CHEK GUIDE) w/Device KIT TEST BLOOD SUGAR ONCE DAILY  ° carvedilol (COREG) 12.5 MG tablet TAKE 1 TABLET BY MOUTH EVERY DAY  ° empagliflozin (JARDIANCE) 10 mg, Oral, Daily before breakfast  ° furosemide (LASIX) 20 mg, Oral, Daily  ° levothyroxine (SYNTHROID) 224 mcg, Oral, Daily before breakfast  ° losartan (COZAAR) 25 mg, Oral, Daily  ° MAGNESIUM PO 1 tablet, Oral, Daily  ° metFORMIN (GLUCOPHAGE) 1,000 mg, Oral, 2 times daily with meals  ° Multiple Vitamin (MULTIVITAMIN WITH MINERALS) TABS tablet 1 tablet, Oral, Daily  ° OVER THE COUNTER MEDICATION 1 application, Topical, Daily at bedtime, CBD Cream for neuropathy in feet  ° potassium chloride (KLOR-CON M10) 10 MEQ tablet TAKE 1 TABLET BY MOUTH EVERY DAY  ° tadalafil (CIALIS) 20 mg, Oral, Daily PRN  ° Trulicity 1.5 mg, Subcutaneous, Every Sat  ° vitamin E 400 Units, Daily  ° ° °   °Objective:  ° Physical  Exam °BP 138/80 (BP Location: Left Arm, Patient Position: Sitting, Cuff Size: Normal)    Pulse 72    Temp 98.3 °F (36.8 °C) (Oral)    Resp 16    Ht 6' 2" (1.88 m)    Wt 253 lb 4 oz (114.9 kg)    SpO2 93%    BMI 32.52 kg/m²  °General: °Well developed, NAD, BMI noted °Neck: No  thyromegaly  °HEENT:  °Normocephalic . Face symmetric, atraumatic °Lungs:  °CTA B °Normal respiratory effort, no intercostal retractions, no accessory muscle use. °  use. Heart: RRR,  no murmur.  Abdomen:  Not distended, soft, non-tender. No rebound or rigidity.   Lower extremities: no pretibial edema bilaterally DRE: Normal sphincter tone, brown stools, prostate normal Skin: Exposed areas without rash. Not pale. Not jaundice Neurologic:  alert & oriented X3.  Speech normal, gait appropriate for age and unassisted Strength symmetric and appropriate for age.  Psych: Cognition and judgment appear intact.  Cooperative with normal attention span and concentration.  Behavior appropriate. No anxious or depressed appearing.     Assessment     Assessment DM,  actos intolerant 01-2018, sees Dr Buddy Duty + Mild neuropathy on exam 03/2017 (used to be on gaba for muscle cramps) HTN Hyperlipidemia Hypothyroidism h/oThyroid cancer, nodularity, no mets, thyroidectomy 2013- sees Dr Buddy Duty yearly as off 05/2020 CV: --CHF/nonischemic cardiomyopathy dx 2011 --Ventricular tachycardia, 2011,  EF  ~15 -20 % -- s/p  biventricular ICD 2011--- replaced 04-2016 --CAD -Nonobstructive   by cath 08-2014 --LBBB  GI: --GERD --H/o anemia:  --Gastric ulcer per EGD 07-2010 DJD (hip pain, XRs dosne 2017) Gout H/o asthma as a child  covid infex 01/2021   PLAN Here for CPX. DM, thyroid disease: Per Endo Mild neuropathy: At baseline.  He was taking gabapentin for leg cramps not neuropathy.  He quit gabapentin. HTN: BP seems very good, recommend ambulatory BPs Hyperlipidemia: On Lipitor.  Checking labs. Cardiovascular: Follow-up by cardiology. Gout: On  allopurinol, no recent attacks, check a uric acid. Social: He retired few months ago, has more time himself, less stress, going to the gym 3 times a week. RTC: 1 year, sees other physicians throughout the year.    This visit occurred during the SARS-CoV-2 public health emergency.  Safety protocols were in place, including screening questions prior to the visit, additional usage of staff PPE, and extensive cleaning of exam room while observing appropriate contact time as indicated for disinfecting solutions.

## 2021-12-31 NOTE — Assessment & Plan Note (Signed)
Here for CPX. DM, thyroid disease: Per Endo Mild neuropathy: At baseline.  He was taking gabapentin for leg cramps not neuropathy.  He quit gabapentin. HTN: BP seems very good, recommend ambulatory BPs Hyperlipidemia: On Lipitor.  Checking labs. Cardiovascular: Follow-up by cardiology. Gout: On allopurinol, no recent attacks, check a uric acid. Social: He retired few months ago, has more time himself, less stress, going to the gym 3 times a week. RTC: 1 year, sees other physicians throughout the year.

## 2022-01-01 NOTE — Progress Notes (Signed)
f °

## 2022-01-01 NOTE — Progress Notes (Signed)
Remote ICD transmission.   

## 2022-01-04 ENCOUNTER — Other Ambulatory Visit: Payer: Self-pay | Admitting: Internal Medicine

## 2022-01-11 ENCOUNTER — Ambulatory Visit (INDEPENDENT_AMBULATORY_CARE_PROVIDER_SITE_OTHER): Payer: HMO

## 2022-01-11 DIAGNOSIS — Z95810 Presence of automatic (implantable) cardiac defibrillator: Secondary | ICD-10-CM

## 2022-01-11 DIAGNOSIS — I5022 Chronic systolic (congestive) heart failure: Secondary | ICD-10-CM | POA: Diagnosis not present

## 2022-01-13 ENCOUNTER — Telehealth: Payer: Self-pay

## 2022-01-13 NOTE — Telephone Encounter (Signed)
Pt agreed to send missed ICM Transmission today  before 4pm.

## 2022-01-14 ENCOUNTER — Telehealth: Payer: Self-pay

## 2022-01-14 NOTE — Progress Notes (Signed)
EPIC Encounter for ICM Monitoring  Patient Name: Mike Jimenez is a 71 y.o. male Date: 01/14/2022 Primary Care Physican: Colon Branch, MD Primary Cardiologist: Lovena Le Electrophysiologist: Santina Evans Pacing:  98.1% Last Weight: 245-250 lbs   Time in AT/AF     0.0 hr/day (0.0%)                                            Attempted call to patient and unable to reach.  Left detailed message per DPR regarding transmission. Transmission reviewed.    OptiVol Thoracic impedance suggesting normal fluid levels.     Prescribed:  Furosemide 20 mg 1 tablet (20 mg total) daily.   Potassium 10 mEq 1 tablet (10 mEq total) daily   Labs: 01/19/2021 Creatinine 1.22, BUN 21, Potassium 3.7, Sodium 139, GFR 60.74 A complete set of results can be found in Results Review.   Recommendations:  Left voice mail with ICM number and encouraged to call if experiencing any fluid symptoms.   Follow-up plan: ICM clinic phone appointment on 02/15/2022.   91 day device clinic remote transmission 03/24/2022.     EP/Cardiology Office Visits:  Recall 12/28/2021 with Oda Kilts, PA.     Copy of ICM check sent to Dr. Lovena Le.   3 month ICM trend: 01/13/2022.    12-14 Month ICM trend:     Rosalene Billings, RN 01/14/2022 9:22 AM

## 2022-01-14 NOTE — Telephone Encounter (Signed)
Remote ICM transmission received.  Attempted call to patient regarding ICM remote transmission and left detailed message per DPR.  Advised to return call for any fluid symptoms or questions. Next ICM remote transmission scheduled 02/15/2022.   ° ° °

## 2022-01-26 NOTE — Progress Notes (Deleted)
Subjective:   Mike Jimenez is a 71 y.o. male who presents for Medicare Annual/Subsequent preventive examination.  Review of Systems    ***       Objective:    There were no vitals filed for this visit. There is no height or weight on file to calculate BMI.  Advanced Directives 08/06/2021 05/15/2021 01/22/2021 01/18/2020 01/01/2019 05/21/2016 03/08/2016  Does Patient Have a Medical Advance Directive? _0  No No  Would patient like information on creating a medical advance directive? - No - Patient declined No - Patient declined No - Patient declined Yes (MAU/Ambulatory/Procedural Areas - Information given) No - patient declined information No - patient declined information    Current Medications (verified) Outpatient Encounter Medications as of 01/28/2022  Medication Sig   ACCU-CHEK GUIDE test strip TEST BLOOD SUGAR ONCE DAILY   acetaminophen (TYLENOL) 500 MG tablet Take 1,000 mg by mouth every 6 (six) hours as needed (pain).   allopurinol (ZYLOPRIM) 100 MG tablet TAKE 1 TABLET BY MOUTH EVERY DAY   aspirin EC 81 MG tablet Take 1 tablet (81 mg total) by mouth daily.   atorvastatin (LIPITOR) 40 MG tablet Take 1 tablet (40 mg total) by mouth daily.   Blood Glucose Monitoring Suppl (ACCU-CHEK GUIDE) w/Device KIT TEST BLOOD SUGAR ONCE DAILY   carvedilol (COREG) 12.5 MG tablet TAKE 1 TABLET BY MOUTH EVERY DAY   Dulaglutide (TRULICITY) 1.5 DS/2.8JG SOPN Inject 1.5 mg into the skin every Saturday.   empagliflozin (JARDIANCE) 10 MG TABS tablet Take 1 tablet (10 mg total) by mouth daily before breakfast.   furosemide (LASIX) 20 MG tablet Take 1 tablet (20 mg total) by mouth daily.   levothyroxine (SYNTHROID) 112 MCG tablet Take 2 tablets (224 mcg total) by mouth daily before breakfast.   losartan (COZAAR) 25 MG tablet TAKE 1 TABLET (25 MG TOTAL) BY MOUTH DAILY.   MAGNESIUM PO Take 1 tablet by mouth daily.   metFORMIN (GLUCOPHAGE) 1000 MG tablet TAKE 1 TABLET (1,000 MG TOTAL) BY MOUTH 2  (TWO) TIMES DAILY WITH A MEAL.   Multiple Vitamin (MULTIVITAMIN WITH MINERALS) TABS tablet Take 1 tablet by mouth daily.   OVER THE COUNTER MEDICATION Apply 1 application topically at bedtime. CBD Cream for neuropathy in feet   potassium chloride (KLOR-CON M10) 10 MEQ tablet TAKE 1 TABLET BY MOUTH EVERY DAY   tadalafil (CIALIS) 20 MG tablet Take 1 tablet (20 mg total) by mouth daily as needed for erectile dysfunction.   vitamin E 400 UNIT capsule Take 400 Units by mouth daily.   No facility-administered encounter medications on file as of 01/28/2022.    Allergies (verified) Bee venom and Ramipril   History: Past Medical History:  Diagnosis Date   Anemia    Arthritis    "knees" (09/05/2014)   Automatic implantable cardioverter-defibrillator in situ    CAD (coronary artery disease)    a. Mild nonobstructive CAD by cath 08/2014.   CHF (congestive heart failure) ([place])    Childhood asthma    Gastric ulcer 2011   GERD (gastroesophageal reflux disease)    History of gout    Hyperlipidemia    Hypertension    LBBB (left bundle branch block)    NICM (nonischemic cardiomyopathy) ([place])    a. Dx 2011 - presented with sustained VT. Normal coronaries at that time, EF 10-15% by cath and 20-25% by echo. b. s/p biventricular ICD 2011. c. CP 08/2014: mild nonobstructive CAD.   Obesity  Postsurgical hypothyroidism    Thyroid cancer ([place])    a. 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 Dr Harlow Asa. s/p thyroidectomy 2013.   Torn meniscus    Type II diabetes mellitus ([facility])    Ventricular tachycardia    a. Sustained VT 2011 s/p MDT [id number] BiV ICD, ser# [id number].   Past Surgical History:  Procedure Laterality Date   BI-VENTRICULAR IMPLANTABLE CARDIOVERTER DEFIBRILLATOR  (CRT-D)  03/2010   BIOPSY  08/06/2021   Procedure: BIOPSY;  Surgeon: Irene Shipper, MD;  Location: [facility];  Service: Endoscopy;;   CARDIAC CATHETERIZATION  03/2010   COLONOSCOPY WITH PROPOFOL N/A 08/06/2021    Procedure: COLONOSCOPY WITH PROPOFOL;  Surgeon: Irene Shipper, MD;  Location: [facility];  Service: Endoscopy;  Laterality: N/A;   EP IMPLANTABLE DEVICE N/A 05/21/2016   Procedure:  ICD Generator Changeout;  Surgeon: Evans Lance, MD;  Location: [facility];  Service: Cardiovascular;  Laterality: N/A;   LEFT HEART CATHETERIZATION WITH CORONARY ANGIOGRAM N/A 09/06/2014   Procedure: LEFT HEART CATHETERIZATION WITH CORONARY ANGIOGRAM;  Surgeon: Burnell Blanks, MD;  [facility] C[facility];  Service: Cardiovascular;  Laterality: N/A;   POLYPECTOMY  08/06/2021   Procedure: POLYPECTOMY;  Surgeon: Irene Shipper, MD;  Location: [facility];  Service: Endoscopy;;   PTX     traumatic, age 59 months   THYROIDECTOMY  12/10/2011   Procedure: TOTAL THYROIDECTOMY WITH CENTRAL COMPARTMENT DISSECTION;  Surgeon: Earnstine Regal, MD;  Location: [facility];  Service: General;  Laterality: N/A;  Total thyroidectomy with limited central compartment lymph node dissection.   VASECTOMY     Family History  Problem Relation Age of Onset   Atrial fibrillation Father        diseased at ag 82   Hypertension Father        alive @ 60.   Hypothyroidism Mother    Hypertension Mother        alive @ 53.   Diabetes Maternal Grandfather    Diabetes Maternal Aunt    Colon cancer Maternal Aunt    Colon cancer Maternal Aunt    Lung cancer Paternal Uncle    Lung cancer Maternal Uncle    Asthma Sister    Hypothyroidism Daughter    Prostate cancer Neg Hx    CAD Neg Hx    Social History   Socioeconomic History   Marital status: Married    Spouse name: Not on file   Number of children: 2   Years of education: Not on file   Highest education level: Not on file  Occupational History   Occupation: [employer]   Occupation: to retire 2023  Tobacco Use   Smoking status: Former    Packs/day: 2.50    Years: 2.00    Pack years: 5.00    Types: Cigarettes    Quit date: 11/29/1970    Years since quitting: 51.1    Smokeless tobacco: Former    Types: Nurse, children's Use: Never used  Substance and Sexual Activity   Alcohol use: Yes    Comment: drinks rarely   Drug use: No   Sexual activity: Yes  Other Topics Concern   Not on file  Social History Narrative   Live w/ wife , daughter [name] lives in [place]   9 dogs, fish pond.   Job is  physical.   Does not routinely exercise.   Social Determinants of Health   Financial Resource Strain: Low Risk  Difficulty of Paying Living Expenses: Not hard at all  Food Insecurity: No Food Insecurity   Worried About [place] in the Last Year: Never true   Ran Out of Food in the Last Year: Never true  Transportation Needs: No Transportation Needs   Lack of Transportation (Medical): No   Lack of Transportation (Non-Medical): No  Physical Activity: Inactive   Days of Exercise per Week: 0 days   Minutes of Exercise per Session: 0 min  Stress: No Stress Concern Present   Feeling of Stress : Not at all  Social Connections: Moderately Isolated   Frequency of Communication with Friends and Family: More than three times a week   Frequency of Social Gatherings with Friends and Family: More than three times a week   Attends Religious Services: Never   Marine scientist or Organizations: No   Attends Archivist Meetings: Never   Marital Status: Married    Tobacco Counseling Counseling given: Not Answered   Clinical Intake:                 Diabetic? Yes Nutrition Risk Assessment:  Has the patient had any N/V/D within the last 2 months?  {YES/NO:21197} Does the patient have any non-healing wounds?  {YES/NO:21197} Has the patient had any unintentional weight loss or weight gain?  {YES/NO:21197}  Diabetes:  Is the patient diabetic?  {YES/NO:21197} If diabetic, was a CBG obtained today?  {YES/NO:21197} Did the patient bring in their glucometer from home?  {YES/NO:21197} How often do you monitor your CBG's? ***.    Financial Strains and Diabetes Management:  Are you having any financial strains with the device, your supplies or your medication? {YES/NO:21197}.  Does the patient want to be seen by Chronic Care Management for management of their diabetes?  {YES/NO:21197} Would the patient like to be referred to a Nutritionist or for Diabetic Management?  {YES/NO:21197}  Diabetic Exams:  {Diabetic Eye Exam:2101801} {Diabetic Foot Exam:2101802}          Activities of Daily Living No flowsheet data found.  Patient Care Team: Colon Branch, MD as PCP - General (Internal Medicine) Evans Lance, MD as PCP - Electrophysiology (Cardiology) Delrae Rend, MD as Consulting Physician (Endocrinology) Thalia Bloodgood, OD as Referring Physician (Optometry) Irene Shipper, MD as Consulting Physician (Gastroenterology)  Indicate any recent Medical Services you may have received from other than [facility] providers in the past year (date may be approximate).     Assessment:   This is a routine wellness examination for Mike Jimenez.  Hearing/Vision screen No results found.  Dietary issues and exercise activities discussed:     Goals Addressed   None    Depression Screen PHQ 2/9 Scores 12/31/2021 01/22/2021 12/30/2020 01/18/2020 10/05/2019 01/01/2019 12/25/2018  PHQ - 2 Score 0 0 0 0 0 0 0  PHQ- 9 Score - - - - - 0 2    Fall Risk Fall Risk  12/31/2021 01/22/2021 12/30/2020 01/18/2020 10/05/2019  Falls in the past year? 0 1 1 0 0  Number falls in past yr: 0 0 0 0 -  Injury with Fall? 0 0 0 0 -  Follow up Falls evaluation completed Falls prevention discussed - Education provided;Falls prevention discussed Falls evaluation completed    FALL RISK PREVENTION PERTAINING TO THE HOME:  Any stairs in or around the home? {YES/NO:21197} If so, are there any without handrails? {YES/NO:21197} Home free of loose throw rugs in walkways, pet beds, electrical cords, etc? {YES/NO:21197}  Adequate lighting in your home to reduce risk  of falls? {YES/NO:21197}  ASSISTIVE DEVICES UTILIZED TO PREVENT FALLS:  Life alert? {YES/NO:21197} Use of a cane, walker or w/c? {YES/NO:21197} Grab bars in the bathroom? {YES/NO:21197} Shower chair or bench in shower? {YES/NO:21197} Elevated toilet seat or a handicapped toilet? {YES/NO:21197}  TIMED UP AND GO:  Was the test performed? {YES/NO:21197}.  Length of time to ambulate 10 feet: *** sec.   {Appearance of Gait:2101803}  Cognitive Function: MMSE - Mini Mental State Exam 01/01/2019  Orientation to time 5  Orientation to Place 5  Registration 3  Attention/ Calculation 5  Recall 3  Language- name 2 objects 2  Language- repeat 1  Language- follow 3 step command 3  Language- read & follow direction 1  Write a sentence 1  Copy design 1  Total score 30        Immunizations Immunization History  Administered Date(s) Administered   Influenza Split 09/08/2011, 07/20/2018, 08/28/2021   Influenza Whole 08/17/2010   Influenza, High Dose Seasonal PF 07/11/2017, 07/16/2019, 08/01/2020   Influenza,inj,Quad PF,6+ Mos 08/29/2013   Influenza-Unspecified 07/30/2014, 07/31/2015, 07/30/2016   PFIZER(Purple Top)SARS-COV-2 Vaccination 02/10/2020, 03/01/2020, 09/13/2020   PPD Test 09/17/2011   Pfizer Covid-19 Vaccine Bivalent Booster 3yr & up 08/28/2021   Pneumococcal Conjugate-13 11/20/2014   Pneumococcal Polysaccharide-23 04/23/2010, 04/11/2017, 06/23/2020   Tdap 01/23/2014, 06/23/2019, 12/09/2021   Zoster Recombinat (Shingrix) 11/20/2018, 01/21/2019   Zoster, Live 11/28/2015    TDAP status: Up to date  Flu Vaccine status: Up to date  Pneumococcal vaccine status: Up to date  Covid-19 vaccine status: Completed vaccines  Qualifies for Shingles Vaccine? Yes   Zostavax completed No   Shingrix Completed?: Yes  Screening Tests Health Maintenance  Topic Date Due   OPHTHALMOLOGY EXAM  04/10/2021   HEMOGLOBIN A1C  02/17/2022   FOOT EXAM  10/13/2022   COLONOSCOPY (Pts  45-460yrInsurance coverage will need to be confirmed)  08/06/2026   TETANUS/TDAP  12/10/2031   Pneumonia Vaccine 6574Years old  Completed   INFLUENZA VACCINE  Completed   COVID-19 Vaccine  Completed   Hepatitis C Screening  Completed   Zoster Vaccines- Shingrix  Completed   HPV VACCINES  Aged Out    Health Maintenance  Health Maintenance Due  Topic Date Due   OPHTHALMOLOGY EXAM  04/10/2021    Colorectal cancer screening: Type of screening: Colonoscopy. Completed 08/06/2021. Repeat every 5 years  Lung Cancer Screening: (Low Dose CT Chest recommended if Age 70-80ears, 30 pack-year currently smoking OR have quit w/in 15years.) does not qualify.   Lung Cancer Screening Referral: N/A  Additional Screening:  Hepatitis C Screening: does qualify; Completed 11/05/2016  Vision Screening: Recommended annual ophthalmology exams for early detection of glaucoma and other disorders of the eye. Is the patient up to date with their annual eye exam?  {YES/NO:21197} Who is the provider or what is the name of the office in which the patient attends annual eye exams? *** If pt is not established with a provider, would they like to be referred to a provider to establish care? {YES/NO:21197}.   Dental Screening: Recommended annual dental exams for proper oral hygiene  Community Resource Referral / Chronic Care Management: CRR required this visit?  {YES/NO:21197}  CCM required this visit?  {YES/NO:21197}     Plan:     I have personally reviewed and noted the following in the patients chart:   Medical and social history Use of alcohol, tobacco or illicit drugs  Current  medications and supplements including opioid prescriptions. {Opioid [phone]} Functional ability and status Nutritional status Physical activity Advanced directives List of other physicians Hospitalizations, surgeries, and ER visits in previous 12 months Vitals Screenings to include cognitive,  depression, and falls Referrals and appointments  In addition, I have reviewed and discussed with patient certain preventive protocols, quality metrics, and best practice recommendations. A written personalized care plan for preventive services as well as general preventive health recommendations were provided to patient.     Duard Brady Solymar Grace, CMA   01/26/2022   Nurse Notes: ***

## 2022-01-28 ENCOUNTER — Ambulatory Visit (INDEPENDENT_AMBULATORY_CARE_PROVIDER_SITE_OTHER): Payer: HMO

## 2022-01-28 VITALS — BP 116/68 | HR 80 | Temp 99.1°F | Resp 16 | Ht 74.0 in | Wt 251.4 lb

## 2022-01-28 DIAGNOSIS — Z0000 Encounter for general adult medical examination without abnormal findings: Secondary | ICD-10-CM | POA: Diagnosis not present

## 2022-01-28 NOTE — Progress Notes (Signed)
Subjective:   Mike Jimenez is a 71 y.o. male who presents for Medicare Annual/Subsequent preventive examination.  Review of Systems     Cardiac Risk Factors include: hypertension;advanced age (>51mn, >>34women);dyslipidemia;diabetes mellitus;obesity (BMI >30kg/m2);male gender     Objective:    Today's Vitals   01/28/22 1418  BP: 116/68  Pulse: 80  Resp: 16  Temp: 99.1 F (37.3 C)  TempSrc: Oral  SpO2: 93%  Weight: 251 lb 6.4 oz (114 kg)  Height: 6' 2"  (1.88 m)   Body mass index is 32.28 kg/m.  Advanced Directives 01/28/2022 08/06/2021 05/15/2021 01/22/2021 01/18/2020 01/01/2019 05/21/2016  Does Patient Have a Medical Advance Directive? Yes No No No No No No  Type of Advance Directive HFlorencein Chart? No - copy requested - - - - - -  Would patient like information on creating a medical advance directive? - - No - Patient declined No - Patient declined No - Patient declined Yes (MAU/Ambulatory/Procedural Areas - Information given) No - patient declined information    Current Medications (verified) Outpatient Encounter Medications as of 01/28/2022  Medication Sig   ACCU-CHEK GUIDE test strip TEST BLOOD SUGAR ONCE DAILY   acetaminophen (TYLENOL) 500 MG tablet Take 1,000 mg by mouth every 6 (six) hours as needed (pain).   allopurinol (ZYLOPRIM) 100 MG tablet TAKE 1 TABLET BY MOUTH EVERY DAY   Apple Cider Vinegar 188 MG CAPS Take by mouth.   aspirin EC 81 MG tablet Take 1 tablet (81 mg total) by mouth daily.   atorvastatin (LIPITOR) 40 MG tablet Take 1 tablet (40 mg total) by mouth daily.   Blood Glucose Monitoring Suppl (ACCU-CHEK GUIDE) w/Device KIT TEST BLOOD SUGAR ONCE DAILY   carvedilol (COREG) 12.5 MG tablet TAKE 1 TABLET BY MOUTH EVERY DAY   Dulaglutide (TRULICITY) 1.5 MHU/3.1SHSOPN Inject 1.5 mg into the skin every Saturday.   empagliflozin (JARDIANCE) 10 MG TABS tablet Take 1 tablet (10 mg total) by mouth  daily before breakfast.   furosemide (LASIX) 20 MG tablet Take 1 tablet (20 mg total) by mouth daily.   levothyroxine (SYNTHROID) 112 MCG tablet Take 2 tablets (224 mcg total) by mouth daily before breakfast.   losartan (COZAAR) 25 MG tablet TAKE 1 TABLET (25 MG TOTAL) BY MOUTH DAILY.   MAGNESIUM PO Take 1 tablet by mouth daily.   metFORMIN (GLUCOPHAGE) 1000 MG tablet TAKE 1 TABLET (1,000 MG TOTAL) BY MOUTH 2 (TWO) TIMES DAILY WITH A MEAL.   Multiple Vitamin (MULTIVITAMIN WITH MINERALS) TABS tablet Take 1 tablet by mouth daily.   OVER THE COUNTER MEDICATION Apply 1 application topically at bedtime. CBD Cream for neuropathy in feet   potassium chloride (KLOR-CON M10) 10 MEQ tablet TAKE 1 TABLET BY MOUTH EVERY DAY   tadalafil (CIALIS) 20 MG tablet Take 1 tablet (20 mg total) by mouth daily as needed for erectile dysfunction.   vitamin E 400 UNIT capsule Take 400 Units by mouth daily.   No facility-administered encounter medications on file as of 01/28/2022.    Allergies (verified) Bee venom and Ramipril   History: Past Medical History:  Diagnosis Date   Anemia    Arthritis    "knees" (09/05/2014)   Automatic implantable cardioverter-defibrillator in situ    CAD (coronary artery disease)    a. Mild nonobstructive CAD by cath 08/2014.   CHF (congestive heart failure) (HCC)    Childhood asthma  Gastric ulcer 2011   GERD (gastroesophageal reflux disease)    History of gout    Hyperlipidemia    Hypertension    LBBB (left bundle branch block)    NICM (nonischemic cardiomyopathy) ([place])    a. Dx 2011 - presented with sustained VT. Normal coronaries at that time, EF 10-15% by cath and 20-25% by echo. b. s/p biventricular ICD 2011. c. CP 08/2014: mild nonobstructive CAD.   Obesity    Postsurgical hypothyroidism    Thyroid cancer ([place])    a. 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 Dr Harlow Asa. s/p thyroidectomy 2013.   Torn meniscus    Type II diabetes mellitus (HCC)     Ventricular tachycardia    a. Sustained VT 2011 s/p MDT [id number] Concert BiV ICD, ser# [id number].   Past Surgical History:  Procedure Laterality Date   BI-VENTRICULAR IMPLANTABLE CARDIOVERTER DEFIBRILLATOR  (CRT-D)  03/2010   BIOPSY  08/06/2021   Procedure: BIOPSY;  Surgeon: Irene Shipper, MD;  Location: [facility];  Service: Endoscopy;;   CARDIAC CATHETERIZATION  03/2010   COLONOSCOPY WITH PROPOFOL N/A 08/06/2021   Procedure: COLONOSCOPY WITH PROPOFOL;  Surgeon: Irene Shipper, MD;  Location: [facility];  Service: Endoscopy;  Laterality: N/A;   EP IMPLANTABLE DEVICE N/A 05/21/2016   Procedure:  ICD Generator Changeout;  Surgeon: Evans Lance, MD;  Location: [place] [facility];  Service: Cardiovascular;  Laterality: N/A;   LEFT HEART CATHETERIZATION WITH CORONARY ANGIOGRAM N/A 09/06/2014   Procedure: LEFT HEART CATHETERIZATION WITH CORONARY ANGIOGRAM;  Surgeon: Burnell Blanks, MD;  Location: [facility] CATH LAB;  Service: Cardiovascular;  Laterality: N/A;   POLYPECTOMY  08/06/2021   Procedure: POLYPECTOMY;  Surgeon: Irene Shipper, MD;  Location: [facility];  Service: Endoscopy;;   PTX     traumatic, age 7 months   THYROIDECTOMY  12/10/2011   Procedure: TOTAL THYROIDECTOMY WITH CENTRAL COMPARTMENT DISSECTION;  Surgeon: Earnstine Regal, MD;  Location: [place];  Service: General;  Laterality: N/A;  Total thyroidectomy with limited central compartment lymph node dissection.   VASECTOMY     Family History  Problem Relation Age of Onset   Atrial fibrillation Father        diseased at ag 42   Hypertension Father        alive @ 3.   Hypothyroidism Mother    Hypertension Mother        alive @ 42.   Diabetes Maternal Grandfather    Diabetes Maternal Aunt    Colon cancer Maternal Aunt    Colon cancer Maternal Aunt    Lung cancer Paternal Uncle    Lung cancer Maternal Uncle    Asthma Sister    Hypothyroidism Daughter    Prostate cancer Neg Hx    CAD Neg Hx    Social History   Socioeconomic  History   Marital status: Married    Spouse name: Not on file   Number of children: 2   Years of education: Not on file   Highest education level: Not on file  Occupational History   Occupation: [employer]   Occupation: to retire 2023  Tobacco Use   Smoking status: Former    Packs/day: 2.50    Years: 2.00    Pack years: 5.00    Types: Cigarettes    Quit date: 11/29/1970    Years since quitting: 51.2   Smokeless tobacco: Former    Types: Nurse, children's Use: Never used  Substance  and Sexual Activity   Alcohol use: Yes    Comment: drinks rarely   Drug use: No   Sexual activity: Yes  Other Topics Concern   Not on file  Social History Narrative   Live w/ wife , daughter [name] lives in [place]   9 dogs, fish pond.   Job is  physical.   Does not routinely exercise.   Social Determinants of Health   Financial Resource Strain: Low Risk    Difficulty of Paying Living Expenses: Not hard at all  Food Insecurity: No Food Insecurity   Worried About Charity fundraiser in the Last Year: Never true   [place] in the Last Year: Never true  Transportation Needs: No Transportation Needs   Lack of Transportation (Medical): No   Lack of Transportation (Non-Medical): No  Physical Activity: Sufficiently Active   Days of Exercise per Week: 3 days   Minutes of Exercise per Session: 50 min  Stress: No Stress Concern Present   Feeling of Stress : Not at all  Social Connections: Moderately Isolated   Frequency of Communication with Friends and Family: Twice a week   Frequency of Social Gatherings with Friends and Family: Three times a week   Attends Religious Services: Never   Active Member of Clubs or Organizations: No   Attends Music therapist: Never   Marital Status: Married    Tobacco Counseling Counseling given: Not Answered   Clinical Intake:  Pre-visit preparation completed: Yes  Pain : No/denies pain     BMI - recorded: 32.28 Nutritional  Status: BMI > 30  Obese Nutritional Risks: None Diabetes: Yes CBG done?: No Did pt. bring in CBG monitor from home?: No  How often do you need to have someone help you when you read instructions, pamphlets, or other written materials from your doctor or pharmacy?: 1 - Never  Diabetes:  Is the patient diabetic?  Yes  If diabetic, was a CBG obtained today?  No  Did the patient bring in their glucometer from home?  No  How often do you monitor your CBG's? 2 times per week.   Financial Strains and Diabetes Management:  Are you having any financial strains with the device, your supplies or your medication? No .  Does the patient want to be seen by Chronic Care Management for management of their diabetes?  No  Would the patient like to be referred to a Nutritionist or for Diabetic Management?  No   Diabetic Exams:  Diabetic Eye Exam: Completed 12/2021 per patient.  Diabetic Foot Exam: Completed 10/13/2021.   Interpreter Needed?: No  Information entered by :: Caroleen Hamman LPN   Activities of Daily Living In your present state of health, do you have any difficulty performing the following activities: 01/28/2022  Hearing? N  Vision? N  Difficulty concentrating or making decisions? N  Walking or climbing stairs? N  Dressing or bathing? N  Doing errands, shopping? N  Preparing Food and eating ? N  Using the Toilet? N  In the past six months, have you accidently leaked urine? N  Do you have problems with loss of bowel control? N  Managing your Medications? N  Managing your Finances? N  Housekeeping or managing your Housekeeping? N  Some recent data might be hidden    Patient Care Team: Colon Branch, MD as PCP - General (Internal Medicine) Evans Lance, MD as PCP - Electrophysiology (Cardiology) Delrae Rend, MD as Consulting Physician (Endocrinology)  Thalia Bloodgood, OD as Referring Physician (Optometry) Irene Shipper, MD as Consulting Physician  (Gastroenterology)  Indicate any recent Medical Services you may have received from other than [facility] providers in the past year (date may be approximate).     Assessment:   This is a routine wellness examination for Mike Jimenez.  Hearing/Vision screen Hearing Screening - Comments:: No issues Vision Screening - Comments:: Last eye exam-12/2021-Dr. Sherlean Foot  Dietary issues and exercise activities discussed: Current Exercise Habits: Home exercise routine, Type of exercise: strength training/weights, Time (Minutes): 45, Frequency (Times/Week): 3, Weekly Exercise (Minutes/Week): 135, Intensity: Moderate, Exercise limited by: None identified;Other - see comments (Eliptical, crunches)   Goals Addressed             This Visit's Progress    Patient Stated   On track    Drink more water & less soft drinks       Depression Screen PHQ 2/9 Scores 01/28/2022 12/31/2021 01/22/2021 12/30/2020 01/18/2020 10/05/2019 01/01/2019  PHQ - 2 Score 0 0 0 0 0 0 0  PHQ- 9 Score - - - - - - 0    Fall Risk Fall Risk  01/28/2022 12/31/2021 01/22/2021 12/30/2020 01/18/2020  Falls in the past year? 0 0 1 1 0  Number falls in past yr: 0 0 0 0 0  Injury with Fall? 0 0 0 0 0  Follow up Falls prevention discussed Falls evaluation completed Falls prevention discussed - Education provided;Falls prevention discussed    FALL RISK PREVENTION PERTAINING TO THE HOME:  Any stairs in or around the home? Yes  If so, are there any without handrails? No  Home free of loose throw rugs in walkways, pet beds, electrical cords, etc? Yes  Adequate lighting in your home to reduce risk of falls? Yes   ASSISTIVE DEVICES UTILIZED TO PREVENT FALLS:  Life alert? No  Use of a cane, walker or w/c? No  Grab bars in the bathroom? Yes  Shower chair or bench in shower? No  Elevated toilet seat or a handicapped toilet? No   TIMED UP AND GO:  Was the test performed? Yes .  Length of time to ambulate 10 feet: 11 sec.   Gait steady and fast without use  of assistive device  Cognitive Function:Normal cognitive status assessed by direct observation by this Nurse Health Advisor. No abnormalities found.   MMSE - Mini Mental State Exam 01/01/2019  Orientation to time 5  Orientation to Place 5  Registration 3  Attention/ Calculation 5  Recall 3  Language- name 2 objects 2  Language- repeat 1  Language- follow 3 step command 3  Language- read & follow direction 1  Write a sentence 1  Copy design 1  Total score 30        Immunizations Immunization History  Administered Date(s) Administered   Influenza Split 09/08/2011, 07/20/2018, 08/28/2021   Influenza Whole 08/17/2010   Influenza, High Dose Seasonal PF 07/11/2017, 07/16/2019, 08/01/2020   Influenza,inj,Quad PF,6+ Mos 08/29/2013   Influenza-Unspecified 07/30/2014, 07/31/2015, 07/30/2016   PFIZER(Purple Top)SARS-COV-2 Vaccination 02/10/2020, 03/01/2020, 09/13/2020   PPD Test 09/17/2011   Pfizer Covid-19 Vaccine Bivalent Booster 91yr & up 08/28/2021   Pneumococcal Conjugate-13 11/20/2014   Pneumococcal Polysaccharide-23 04/23/2010, 04/11/2017, 06/23/2020   Tdap 01/23/2014, 06/23/2019, 12/09/2021   Zoster Recombinat (Shingrix) 11/20/2018, 01/21/2019   Zoster, Live 11/28/2015    TDAP status: Up to date  Flu Vaccine status: Up to date  Pneumococcal vaccine status: Up to date  Covid-19 vaccine status: Completed vaccines  Qualifies for Shingles Vaccine? No   Zostavax completed Yes   Shingrix Completed?: Yes  Screening Tests Health Maintenance  Topic Date Due   OPHTHALMOLOGY EXAM  04/10/2021   HEMOGLOBIN A1C  02/17/2022   FOOT EXAM  10/13/2022   COLONOSCOPY (Pts 45-21yr Insurance coverage will need to be confirmed)  08/06/2026   TETANUS/TDAP  12/10/2031   Pneumonia Vaccine 70 Years old  Completed   INFLUENZA VACCINE  Completed   COVID-19 Vaccine  Completed   Hepatitis C Screening  Completed   Zoster Vaccines- Shingrix  Completed   HPV VACCINES  Aged Out    Health  Maintenance  Health Maintenance Due  Topic Date Due   OPHTHALMOLOGY EXAM  04/10/2021    Colorectal cancer screening: Type of screening: Colonoscopy. Completed 08/06/2021. Repeat every 5-10 years  Lung Cancer Screening: (Low Dose CT Chest recommended if Age 70-80years, 30 pack-year currently smoking OR have quit w/in 15years.) does not qualify.     Additional Screening:  Hepatitis C Screening: Completed 11/05/2016  Vision Screening: Recommended annual ophthalmology exams for early detection of glaucoma and other disorders of the eye. Is the patient up to date with their annual eye exam?  Yes  Who is the provider or what is the name of the office in which the patient attends annual eye exams? Dr. DSherlean Foot  Dental Screening: Recommended annual dental exams for proper oral hygiene  Community Resource Referral / Chronic Care Management: CRR required this visit?  No   CCM required this visit?  No      Plan:     I have personally reviewed and noted the following in the patients chart:   Medical and social history Use of alcohol, tobacco or illicit drugs  Current medications and supplements including opioid prescriptions. Patient is not currently taking opioid prescriptions. Functional ability and status Nutritional status Physical activity Advanced directives List of other physicians Hospitalizations, surgeries, and ER visits in previous 12 months Vitals Screenings to include cognitive, depression, and falls Referrals and appointments  In addition, I have reviewed and discussed with patient certain preventive protocols, quality metrics, and best practice recommendations. A written personalized care plan for preventive services as well as general preventive health recommendations were provided to patient.   Patient would like to access avs on mychart.  MMarta Antu LPN   30/0/7121 Nurse health Advisor  Nurse Notes: None

## 2022-01-28 NOTE — Patient Instructions (Signed)
Mike Jimenez , Thank you for taking time to come for your Medicare Wellness Visit. I appreciate your ongoing commitment to your health goals. Please review the following plan we discussed and let me know if I can assist you in the future.   Screening recommendations/referrals: Colonoscopy: Completed 08/06/2021-Repeat in 5-10 years. Recommended yearly ophthalmology/optometry visit for glaucoma screening and checkup Recommended yearly dental visit for hygiene and checkup  Vaccinations: Influenza vaccine: Up to date Pneumococcal vaccine: Up to date Tdap vaccine: Up to date Shingles vaccine: Completed vaccines   Covid-19: Up to date  Advanced directives: Please bring a copy of Living Will and/or Healthcare Power of Attorney for your chart.   Conditions/risks identified: See problem list  Next appointment: Follow up in one year for your annual wellness visit. 02/01/2023 @ 2:20  Preventive Care 65 Years and Older, Male Preventive care refers to lifestyle choices and visits with your health care provider that can promote health and wellness. What does preventive care include? A yearly physical exam. This is also called an annual well check. Dental exams once or twice a year. Routine eye exams. Ask your health care provider how often you should have your eyes checked. Personal lifestyle choices, including: Daily care of your teeth and gums. Regular physical activity. Eating a healthy diet. Avoiding tobacco and drug use. Limiting alcohol use. Practicing safe sex. Taking low doses of aspirin every day. Taking vitamin and mineral supplements as recommended by your health care provider. What happens during an annual well check? The services and screenings done by your health care provider during your annual well check will depend on your age, overall health, lifestyle risk factors, and family history of disease. Counseling  Your health care provider may ask you questions about your: Alcohol  use. Tobacco use. Drug use. Emotional well-being. Home and relationship well-being. Sexual activity. Eating habits. History of falls. Memory and ability to understand (cognition). Work and work Statistician. Screening  You may have the following tests or measurements: Height, weight, and BMI. Blood pressure. Lipid and cholesterol levels. These may be checked every 5 years, or more frequently if you are over 38 years old. Skin check. Lung cancer screening. You may have this screening every year starting at age 73 if you have a 30-pack-year history of smoking and currently smoke or have quit within the past 15 years. Fecal occult blood test (FOBT) of the stool. You may have this test every year starting at age 87. Flexible sigmoidoscopy or colonoscopy. You may have a sigmoidoscopy every 5 years or a colonoscopy every 10 years starting at age 16. Prostate cancer screening. Recommendations will vary depending on your family history and other risks. Hepatitis C blood test. Hepatitis B blood test. Sexually transmitted disease (STD) testing. Diabetes screening. This is done by checking your blood sugar (glucose) after you have not eaten for a while (fasting). You may have this done every 1-3 years. Abdominal aortic aneurysm (AAA) screening. You may need this if you are a current or former smoker. Osteoporosis. You may be screened starting at age 47 if you are at high risk. Talk with your health care provider about your test results, treatment options, and if necessary, the need for more tests. Vaccines  Your health care provider may recommend certain vaccines, such as: Influenza vaccine. This is recommended every year. Tetanus, diphtheria, and acellular pertussis (Tdap, Td) vaccine. You may need a Td booster every 10 years. Zoster vaccine. You may need this after age 51. Pneumococcal 13-valent conjugate (  PCV13) vaccine. One dose is recommended after age 58. Pneumococcal polysaccharide  (PPSV23) vaccine. One dose is recommended after age 53. Talk to your health care provider about which screenings and vaccines you need and how often you need them. This information is not intended to replace advice given to you by your health care provider. Make sure you discuss any questions you have with your health care provider. Document Released: 12/12/2015 Document Revised: 08/04/2016 Document Reviewed: 09/16/2015 Elsevier Interactive Patient Education  2017 [place] Prevention in the Home Falls can cause injuries. They can happen to people of all ages. There are many things you can do to make your home safe and to help prevent falls. What can I do on the outside of my home? Regularly fix the edges of walkways and driveways and fix any cracks. Remove anything that might make you trip as you walk through a door, such as a raised step or threshold. Trim any bushes or trees on the path to your home. Use bright outdoor lighting. Clear any walking paths of anything that might make someone trip, such as rocks or tools. Regularly check to see if handrails are loose or broken. Make sure that both sides of any steps have handrails. Any raised decks and porches should have guardrails on the edges. Have any leaves, snow, or ice cleared regularly. Use sand or salt on walking paths during winter. Clean up any spills in your garage right away. This includes oil or grease spills. What can I do in the bathroom? Use night lights. Install grab bars by the toilet and in the tub and shower. Do not use towel bars as grab bars. Use non-skid mats or decals in the tub or shower. If you need to sit down in the shower, use a plastic, non-slip stool. Keep the floor dry. Clean up any water that spills on the floor as soon as it happens. Remove soap buildup in the tub or shower regularly. Attach bath mats securely with double-sided non-slip rug tape. Do not have throw rugs and other things on the  floor that can make you trip. What can I do in the bedroom? Use night lights. Make sure that you have a light by your bed that is easy to reach. Do not use any sheets or blankets that are too big for your bed. They should not hang down onto the floor. Have a firm chair that has side arms. You can use this for support while you get dressed. Do not have throw rugs and other things on the floor that can make you trip. What can I do in the kitchen? Clean up any spills right away. Avoid walking on wet floors. Keep items that you use a lot in easy-to-reach places. If you need to reach something above you, use a strong step stool that has a grab bar. Keep electrical cords out of the way. Do not use floor polish or wax that makes floors slippery. If you must use wax, use non-skid floor wax. Do not have throw rugs and other things on the floor that can make you trip. What can I do with my stairs? Do not leave any items on the stairs. Make sure that there are handrails on both sides of the stairs and use them. Fix handrails that are broken or loose. Make sure that handrails are as long as the stairways. Check any carpeting to make sure that it is firmly attached to the stairs. Fix any carpet that is  loose or worn. Avoid having throw rugs at the top or bottom of the stairs. If you do have throw rugs, attach them to the floor with carpet tape. Make sure that you have a light switch at the top of the stairs and the bottom of the stairs. If you do not have them, ask someone to add them for you. What else can I do to help prevent falls? Wear shoes that: Do not have high heels. Have rubber bottoms. Are comfortable and fit you well. Are closed at the toe. Do not wear sandals. If you use a stepladder: Make sure that it is fully opened. Do not climb a closed stepladder. Make sure that both sides of the stepladder are locked into place. Ask someone to hold it for you, if possible. Clearly mark and make  sure that you can see: Any grab bars or handrails. First and last steps. Where the edge of each step is. Use tools that help you move around (mobility aids) if they are needed. These include: Canes. Walkers. Scooters. Crutches. Turn on the lights when you go into a dark area. Replace any light bulbs as soon as they burn out. Set up your furniture so you have a clear path. Avoid moving your furniture around. If any of your floors are uneven, fix them. If there are any pets around you, be aware of where they are. Review your medicines with your doctor. Some medicines can make you feel dizzy. This can increase your chance of falling. Ask your doctor what other things that you can do to help prevent falls. This information is not intended to replace advice given to you by your health care provider. Make sure you discuss any questions you have with your health care provider. Document Released: 09/11/2009 Document Revised: 04/22/2016 Document Reviewed: 12/20/2014 Elsevier Interactive Patient Education  2017 [employer].

## 2022-02-06 ENCOUNTER — Other Ambulatory Visit: Payer: Self-pay | Admitting: Internal Medicine

## 2022-02-15 ENCOUNTER — Ambulatory Visit (INDEPENDENT_AMBULATORY_CARE_PROVIDER_SITE_OTHER): Payer: HMO

## 2022-02-15 DIAGNOSIS — Z95810 Presence of automatic (implantable) cardiac defibrillator: Secondary | ICD-10-CM | POA: Diagnosis not present

## 2022-02-15 DIAGNOSIS — I5022 Chronic systolic (congestive) heart failure: Secondary | ICD-10-CM | POA: Diagnosis not present

## 2022-02-17 DIAGNOSIS — Z85850 Personal history of malignant neoplasm of thyroid: Secondary | ICD-10-CM | POA: Diagnosis not present

## 2022-02-17 DIAGNOSIS — E119 Type 2 diabetes mellitus without complications: Secondary | ICD-10-CM | POA: Diagnosis not present

## 2022-02-17 DIAGNOSIS — E890 Postprocedural hypothyroidism: Secondary | ICD-10-CM | POA: Diagnosis not present

## 2022-02-17 LAB — HEMOGLOBIN A1C: Hemoglobin A1C: 7.4

## 2022-02-17 NOTE — Progress Notes (Signed)
EPIC Encounter for ICM Monitoring ? ?Patient Name: Mike Jimenez is a 71 y.o. male ?Date: 02/17/2022 ?Primary Care Physican: Colon Branch, MD ?Primary Cardiologist: Lovena Le ?Electrophysiologist: Lovena Le ?Bi-V Pacing:  98.2% ?02/17/2022 Weight: 250-255 lbs ?  ?Time in AT/AF     0.0 hr/day (0.0%) ?  ?                                         ?Spoke with patient and heart failure questions reviewed.  Pt asymptomatic for fluid accumulation.  Reports feeling well at this time and voices no complaints.  ?  ?OptiVol Thoracic impedance suggesting normal fluid levels.   ?  ?Prescribed:  ?Furosemide 20 mg 1 tablet (20 mg total) daily.   ?Potassium 10 mEq 1 tablet (10 mEq total) daily ?  ?Labs: ?01/19/2021 Creatinine 1.22, BUN 21, Potassium 3.7, Sodium 139, GFR 60.74 ?A complete set of results can be found in Results Review. ?  ?Recommendations:  Left voice mail with ICM number and encouraged to call if experiencing any fluid symptoms. ?  ?Follow-up plan: ICM clinic phone appointment on 03/22/2022.   91 day device clinic remote transmission 03/24/2022.   ?  ?EP/Cardiology Office Visits:  Recall 12/28/2021 with Oda Kilts, PA.   ?  ?Copy of ICM check sent to Dr. Lovena Le.  ? ?3 month ICM trend: 02/17/2022. ? ? ? ?12-14 Month ICM trend:  ? ? ? ?Rosalene Billings, RN ?02/17/2022 ?2:51 PM ? ?

## 2022-03-01 ENCOUNTER — Other Ambulatory Visit: Payer: Self-pay | Admitting: Internal Medicine

## 2022-03-04 ENCOUNTER — Other Ambulatory Visit: Payer: Self-pay | Admitting: Internal Medicine

## 2022-03-07 ENCOUNTER — Other Ambulatory Visit: Payer: Self-pay | Admitting: Internal Medicine

## 2022-03-19 ENCOUNTER — Other Ambulatory Visit: Payer: Self-pay | Admitting: Internal Medicine

## 2022-03-22 ENCOUNTER — Ambulatory Visit (INDEPENDENT_AMBULATORY_CARE_PROVIDER_SITE_OTHER): Payer: HMO

## 2022-03-22 DIAGNOSIS — Z95810 Presence of automatic (implantable) cardiac defibrillator: Secondary | ICD-10-CM

## 2022-03-22 DIAGNOSIS — I5022 Chronic systolic (congestive) heart failure: Secondary | ICD-10-CM

## 2022-03-24 ENCOUNTER — Ambulatory Visit (INDEPENDENT_AMBULATORY_CARE_PROVIDER_SITE_OTHER): Payer: HMO

## 2022-03-24 DIAGNOSIS — I428 Other cardiomyopathies: Secondary | ICD-10-CM | POA: Diagnosis not present

## 2022-03-25 LAB — CUP PACEART REMOTE DEVICE CHECK
Battery Remaining Longevity: 22 mo
Battery Voltage: 2.91 V
Brady Statistic AP VP Percent: 1.2 %
Brady Statistic AP VS Percent: 0.02 %
Brady Statistic AS VP Percent: 97.36 %
Brady Statistic AS VS Percent: 1.42 %
Brady Statistic RA Percent Paced: 1.22 %
Brady Statistic RV Percent Paced: 94.65 %
Date Time Interrogation Session: 20230427005307
HighPow Impedance: 48 Ohm
HighPow Impedance: 60 Ohm
Implantable Lead Implant Date: 20110516
Implantable Lead Implant Date: 20110516
Implantable Lead Implant Date: 20110516
Implantable Lead Location: 753858
Implantable Lead Location: 753859
Implantable Lead Location: 753860
Implantable Lead Model: 5076
Implantable Lead Model: 6947
Implantable Pulse Generator Implant Date: 20170623
Lead Channel Impedance Value: 342 Ohm
Lead Channel Impedance Value: 380 Ohm
Lead Channel Impedance Value: 437 Ohm
Lead Channel Impedance Value: 437 Ohm
Lead Channel Impedance Value: 570 Ohm
Lead Channel Impedance Value: 874 Ohm
Lead Channel Pacing Threshold Amplitude: 0.75 V
Lead Channel Pacing Threshold Amplitude: 1 V
Lead Channel Pacing Threshold Amplitude: 1.25 V
Lead Channel Pacing Threshold Pulse Width: 0.4 ms
Lead Channel Pacing Threshold Pulse Width: 0.4 ms
Lead Channel Pacing Threshold Pulse Width: 0.4 ms
Lead Channel Sensing Intrinsic Amplitude: 1.875 mV
Lead Channel Sensing Intrinsic Amplitude: 1.875 mV
Lead Channel Sensing Intrinsic Amplitude: 8.125 mV
Lead Channel Sensing Intrinsic Amplitude: 8.125 mV
Lead Channel Setting Pacing Amplitude: 2 V
Lead Channel Setting Pacing Amplitude: 2.5 V
Lead Channel Setting Pacing Amplitude: 2.5 V
Lead Channel Setting Pacing Pulse Width: 0.4 ms
Lead Channel Setting Pacing Pulse Width: 0.4 ms
Lead Channel Setting Sensing Sensitivity: 0.3 mV

## 2022-03-25 NOTE — Progress Notes (Signed)
EPIC Encounter for ICM Monitoring ? ?Patient Name: Mike Jimenez is a 71 y.o. male ?Date: 03/25/2022 ?Primary Care Physican: Colon Branch, MD ?Primary Cardiologist: Lovena Le ?Electrophysiologist: Lovena Le ?Bi-V Pacing:  98.3% ?02/17/2022 Weight: 250-255 lbs ?  ?Time in AT/AF     0.0 hr/day (0.0%) ?  ?                                         ?Spoke with patient and heart failure questions reviewed.  Pt asymptomatic for fluid accumulation.  Reports feeling well at this time and voices no complaints.  ?  ?OptiVol Thoracic impedance suggesting normal fluid levels.   ?  ?Prescribed:  ?Furosemide 20 mg 1 tablet (20 mg total) daily.   ?Potassium 10 mEq 1 tablet (10 mEq total) daily ?  ?Labs: ?01/19/2021 Creatinine 1.22, BUN 21, Potassium 3.7, Sodium 139, GFR 60.74 ?A complete set of results can be found in Results Review. ?  ?Recommendations:  No changes and encouraged to call if experiencing any fluid symptoms. ?  ?Follow-up plan: ICM clinic phone appointment on 04/27/2022.   91 day device clinic remote transmission 06/23/2022.   ?  ?EP/Cardiology Office Visits:  Advised to call office for 6 month follow up with Oda Kilts, [place]  Recall 12/28/2021 with Oda Kilts, PA.   ?  ?Copy of ICM check sent to Dr. Lovena Le.  ? ?3 month ICM trend: 03/25/2022. ? ? ? ?12-14 Month ICM trend:  ? ? ? ?Rosalene Billings, RN ?03/25/2022 ?8:34 AM ? ?

## 2022-04-08 NOTE — Progress Notes (Signed)
Remote ICD transmission.   

## 2022-04-22 ENCOUNTER — Encounter: Payer: Self-pay | Admitting: Internal Medicine

## 2022-04-27 ENCOUNTER — Ambulatory Visit (INDEPENDENT_AMBULATORY_CARE_PROVIDER_SITE_OTHER): Payer: HMO

## 2022-04-27 ENCOUNTER — Telehealth: Payer: Self-pay

## 2022-04-27 DIAGNOSIS — I5022 Chronic systolic (congestive) heart failure: Secondary | ICD-10-CM | POA: Diagnosis not present

## 2022-04-27 DIAGNOSIS — Z95810 Presence of automatic (implantable) cardiac defibrillator: Secondary | ICD-10-CM | POA: Diagnosis not present

## 2022-04-27 NOTE — Progress Notes (Signed)
EPIC Encounter for ICM Monitoring  Patient Name: EMIN FOREE is a 71 y.o. male Date: 04/27/2022 Primary Care Physican: Colon Branch, MD Primary Cardiologist: Lovena Le Electrophysiologist: Santina Evans Pacing:  98.3% 02/17/2022 Weight: 250-255 lbs   Time in AT/AF     0.0 hr/day (0.0%)                                            Attempted call to patient and unable to reach.  Left detailed message per DPR regarding transmission. Transmission reviewed.    OptiVol Thoracic impedance suggesting possible fluid accumulation starting 5/23.     Prescribed:  Furosemide 20 mg 1 tablet (20 mg total) daily.   Potassium 10 mEq 1 tablet (10 mEq total) daily   Labs: 01/19/2021 Creatinine 1.22, BUN 21, Potassium 3.7, Sodium 139, GFR 60.74 A complete set of results can be found in Results Review.   Recommendations:  Left voice mail with ICM number and encouraged to call if experiencing any fluid symptoms.   Follow-up plan: ICM clinic phone appointment on 05/03/2022 to recheck fluid levels (manual).   91 day device clinic remote transmission 06/23/2022.     EP/Cardiology Office Visits:  Pt aware to schedule 6 month follow up with Oda Kilts, [place]  Recall 12/28/2021 with Oda Kilts, PA.     Copy of ICM check sent to Dr. Lovena Le.   3 month ICM trend: 04/27/2022.    12-14 Month ICM trend:     Rosalene Billings, RN 04/27/2022 10:55 AM

## 2022-04-27 NOTE — Telephone Encounter (Signed)
Remote ICM transmission received.  Attempted call to patient regarding ICM remote transmission and left detailed message per DPR.  Advised to return call for any fluid symptoms or questions. Next ICM remote transmission scheduled 05/03/2022.

## 2022-05-02 ENCOUNTER — Other Ambulatory Visit: Payer: Self-pay | Admitting: Internal Medicine

## 2022-05-03 ENCOUNTER — Telehealth: Payer: Self-pay

## 2022-05-03 NOTE — Telephone Encounter (Signed)
Attempted call to patient regarding ICM remote transmission and left message requesting manual transmission to recheck fluid levels.

## 2022-05-04 ENCOUNTER — Ambulatory Visit (INDEPENDENT_AMBULATORY_CARE_PROVIDER_SITE_OTHER): Payer: HMO

## 2022-05-04 ENCOUNTER — Telehealth: Payer: Self-pay

## 2022-05-04 DIAGNOSIS — I5022 Chronic systolic (congestive) heart failure: Secondary | ICD-10-CM

## 2022-05-04 DIAGNOSIS — Z95810 Presence of automatic (implantable) cardiac defibrillator: Secondary | ICD-10-CM

## 2022-05-04 NOTE — Progress Notes (Signed)
EPIC Encounter for ICM Monitoring  Patient Name: Mike Jimenez is a 71 y.o. male Date: 05/04/2022 Primary Care Physican: Colon Branch, MD Primary Cardiologist: Lovena Le Electrophysiologist: Santina Evans Pacing:  98.0% 02/17/2022 Weight: 250-255 lbs   Time in AT/AF     0.0 hr/day (0.0%)                                            Attempted call to patient and unable to reach.  Left detailed message per DPR regarding transmission. Transmission reviewed.    OptiVol Thoracic impedance suggesting fluid levels returned to normal .     Prescribed:  Furosemide 20 mg 1 tablet (20 mg total) daily.   Potassium 10 mEq 1 tablet (10 mEq total) daily   Labs: 01/19/2021 Creatinine 1.22, BUN 21, Potassium 3.7, Sodium 139, GFR 60.74 A complete set of results can be found in Results Review.   Recommendations:  Left voice mail with ICM number and encouraged to call if experiencing any fluid symptoms.   Follow-up plan: ICM clinic phone appointment on 05/31/2022.   91 day device clinic remote transmission 06/23/2022.     EP/Cardiology Office Visits:  Pt aware to schedule 6 month follow up with Oda Kilts, [place]  Recall 12/28/2021 with Oda Kilts, PA.     Copy of ICM check sent to Dr. Lovena Le.   3 month ICM trend: 05/03/2022.    12-14 Month ICM trend:     Rosalene Billings, RN 05/04/2022 2:23 PM

## 2022-05-04 NOTE — Telephone Encounter (Signed)
Remote ICM transmission received.  Attempted call to patient regarding ICM remote transmission and left detailed message per DPR.  Advised to return call for any fluid symptoms or questions. Next ICM remote transmission scheduled 05/31/2022.

## 2022-05-06 ENCOUNTER — Encounter: Payer: Self-pay | Admitting: Student

## 2022-05-06 ENCOUNTER — Ambulatory Visit (INDEPENDENT_AMBULATORY_CARE_PROVIDER_SITE_OTHER): Payer: HMO | Admitting: Student

## 2022-05-06 VITALS — BP 126/82 | HR 73 | Ht 74.0 in | Wt 250.0 lb

## 2022-05-06 DIAGNOSIS — I5022 Chronic systolic (congestive) heart failure: Secondary | ICD-10-CM

## 2022-05-06 DIAGNOSIS — I10 Essential (primary) hypertension: Secondary | ICD-10-CM | POA: Diagnosis not present

## 2022-05-06 LAB — CUP PACEART INCLINIC DEVICE CHECK
Battery Remaining Longevity: 21 mo
Battery Voltage: 2.91 V
Brady Statistic AP VP Percent: 0.74 %
Brady Statistic AP VS Percent: 0.02 %
Brady Statistic AS VP Percent: 97.57 %
Brady Statistic AS VS Percent: 1.67 %
Brady Statistic RA Percent Paced: 0.76 %
Brady Statistic RV Percent Paced: 96.3 %
Date Time Interrogation Session: 20230608115613
HighPow Impedance: 52 Ohm
HighPow Impedance: 69 Ohm
Implantable Lead Implant Date: 20110516
Implantable Lead Implant Date: 20110516
Implantable Lead Implant Date: 20110516
Implantable Lead Location: 753858
Implantable Lead Location: 753859
Implantable Lead Location: 753860
Implantable Lead Model: 5076
Implantable Lead Model: 6947
Implantable Pulse Generator Implant Date: 20170623
Lead Channel Impedance Value: 342 Ohm
Lead Channel Impedance Value: 399 Ohm
Lead Channel Impedance Value: 399 Ohm
Lead Channel Impedance Value: 456 Ohm
Lead Channel Impedance Value: 608 Ohm
Lead Channel Impedance Value: 950 Ohm
Lead Channel Pacing Threshold Amplitude: 0.875 V
Lead Channel Pacing Threshold Amplitude: 1 V
Lead Channel Pacing Threshold Amplitude: 1.25 V
Lead Channel Pacing Threshold Pulse Width: 0.4 ms
Lead Channel Pacing Threshold Pulse Width: 0.4 ms
Lead Channel Pacing Threshold Pulse Width: 0.4 ms
Lead Channel Sensing Intrinsic Amplitude: 1.75 mV
Lead Channel Sensing Intrinsic Amplitude: 2.5 mV
Lead Channel Sensing Intrinsic Amplitude: 8.375 mV
Lead Channel Sensing Intrinsic Amplitude: 9.125 mV
Lead Channel Setting Pacing Amplitude: 2 V
Lead Channel Setting Pacing Amplitude: 2.5 V
Lead Channel Setting Pacing Amplitude: 2.5 V
Lead Channel Setting Pacing Pulse Width: 0.4 ms
Lead Channel Setting Pacing Pulse Width: 0.4 ms
Lead Channel Setting Sensing Sensitivity: 0.3 mV

## 2022-05-06 NOTE — Progress Notes (Signed)
Electrophysiology Office Note Date: 05/06/2022  ID:  Loki, Wuthrich 03-Jul-1951, MRN [id number]  PCP: Colon Branch, MD Primary Cardiologist: None Electrophysiologist: Cristopher Peru, MD   CC: Routine ICD follow-up  TYRAIL GRANDFIELD is a 71 y.o. male seen today for Cristopher Peru, MD for routine electrophysiology followup.  Since last being seen in our clinic the patient reports doing very well. He denies symptoms of palpitations, chest pain, shortness of breath, orthopnea, PND, lower extremity edema, claudication, dizziness, presyncope, syncope, bleeding, or neurologic sequela. The patient is tolerating medications without difficulties.  Retired from Psychologist, educational in the fall.  Device History: MDT CRTD implanted 2011 for NICM, CHF; gen change 2017 History of appropriate therapy: No History of AAD therapy: No  Past Medical History:  Diagnosis Date   Anemia    Arthritis    "knees" (09/05/2014)   Automatic implantable cardioverter-defibrillator in situ    CAD (coronary artery disease)    a. Mild nonobstructive CAD by cath 08/2014.   CHF (congestive heart failure) ([place])    Childhood asthma    Gastric ulcer 2011   GERD (gastroesophageal reflux disease)    History of gout    Hyperlipidemia    Hypertension    LBBB (left bundle branch block)    NICM (nonischemic cardiomyopathy) ([place])    a. Dx 2011 - presented with sustained VT. Normal coronaries at that time, EF 10-15% by cath and 20-25% by echo. b. s/p biventricular ICD 2011. c. CP 08/2014: mild nonobstructive CAD.   Obesity    Postsurgical hypothyroidism    Thyroid cancer ([place])    a. 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 Dr Harlow Asa. s/p thyroidectomy 2013.   Torn meniscus    Type II diabetes mellitus ([place])    Ventricular tachycardia ([place])    a. Sustained VT 2011 s/p MDT [id number] Concert BiV ICD, ser# [id number] H.   Past Surgical History:  Procedure Laterality Date   BI-VENTRICULAR IMPLANTABLE CARDIOVERTER DEFIBRILLATOR   (CRT-D)  03/2010   BIOPSY  08/06/2021   Procedure: BIOPSY;  Surgeon: Irene Shipper, MD;  Location: [facility];  Service: Endoscopy;;   CARDIAC CATHETERIZATION  03/2010   COLONOSCOPY WITH PROPOFOL N/A 08/06/2021   Procedure: COLONOSCOPY WITH PROPOFOL;  Surgeon: Irene Shipper, MD;  Location: [facility];  Service: Endoscopy;  Laterality: N/A;   EP IMPLANTABLE DEVICE N/A 05/21/2016   Procedure:  ICD Generator Changeout;  Surgeon: Evans Lance, MD;  Location: [place] [facility] CV LAB;  Service: Cardiovascular;  Laterality: N/A;   LEFT HEART CATHETERIZATION WITH CORONARY ANGIOGRAM N/A 09/06/2014   Procedure: LEFT HEART CATHETERIZATION WITH CORONARY ANGIOGRAM;  Surgeon: Burnell Blanks, MD;  Location: [facility] CATH LAB;  Service: Cardiovascular;  Laterality: N/A;   POLYPECTOMY  08/06/2021   Procedure: POLYPECTOMY;  Surgeon: Irene Shipper, MD;  Location: [facility];  Service: Endoscopy;;   PTX     traumatic, age 102 months   THYROIDECTOMY  12/10/2011   Procedure: TOTAL THYROIDECTOMY WITH CENTRAL COMPARTMENT DISSECTION;  Surgeon: Earnstine Regal, MD;  Location: [facility];  Service: General;  Laterality: N/A;  Total thyroidectomy with limited central compartment lymph node dissection.   VASECTOMY      Current Outpatient Medications  Medication Sig Dispense Refill   ACCU-CHEK GUIDE test strip TEST BLOOD SUGAR ONCE DAILY     acetaminophen (TYLENOL) 500 MG tablet Take 1,000 mg by mouth every 6 (six) hours as needed (pain).     allopurinol (ZYLOPRIM) 100 MG tablet TAKE 1  TABLET BY MOUTH EVERY DAY 90 tablet 3   Apple Cider Vinegar 188 MG CAPS Take by mouth daily.     aspirin EC 81 MG tablet Take 1 tablet (81 mg total) by mouth daily.     atorvastatin (LIPITOR) 40 MG tablet Take 1 tablet (40 mg total) by mouth daily. 90 tablet 1   Blood Glucose Monitoring Suppl (ACCU-CHEK GUIDE) w/Device KIT TEST BLOOD SUGAR ONCE DAILY     carvedilol (COREG) 12.5 MG tablet TAKE 1 TABLET BY MOUTH EVERY DAY 90 tablet 2   Dulaglutide  (TRULICITY) 1.5 ZO/1.0RU SOPN Inject 1.5 mg into the skin every Saturday.     furosemide (LASIX) 20 MG tablet TAKE 1 TABLET BY MOUTH EVERY DAY 90 tablet 2   JARDIANCE 10 MG TABS tablet TAKE 1 TABLET BY MOUTH DAILY BEFORE BREAKFAST. 90 tablet 1   KLOR-CON M10 10 MEQ tablet TAKE 1 TABLET BY MOUTH EVERY DAY 90 tablet 1   levothyroxine (SYNTHROID) 112 MCG tablet Take 2 tablets (224 mcg total) by mouth daily before breakfast.     losartan (COZAAR) 25 MG tablet TAKE 1 TABLET (25 MG TOTAL) BY MOUTH DAILY. 90 tablet 3   MAGNESIUM PO Take 1 tablet by mouth daily.     metFORMIN (GLUCOPHAGE) 1000 MG tablet TAKE 1 TABLET (1,000 MG TOTAL) BY MOUTH 2 (TWO) TIMES DAILY WITH A MEAL. 180 tablet 1   Multiple Vitamin (MULTIVITAMIN WITH MINERALS) TABS tablet Take 1 tablet by mouth daily.     OVER THE COUNTER MEDICATION Apply 1 application topically at bedtime. CBD Cream for neuropathy in feet     tadalafil (CIALIS) 20 MG tablet Take 1 tablet (20 mg total) by mouth daily as needed for erectile dysfunction. 10 tablet 5   vitamin E 400 UNIT capsule Take 400 Units by mouth daily.     No current facility-administered medications for this visit.    Allergies:   Bee venom and Ramipril   Social History: Social History   Socioeconomic History   Marital status: Married    Spouse name: Not on file   Number of children: 2   Years of education: Not on file   Highest education level: Not on file  Occupational History   Occupation: [employer]   Occupation: to retire 2023  Tobacco Use   Smoking status: Former    Packs/day: 2.50    Years: 2.00    Total pack years: 5.00    Types: Cigarettes    Quit date: 11/29/1970    Years since quitting: 51.4   Smokeless tobacco: Former    Types: Nurse, children's Use: Never used  Substance and Sexual Activity   Alcohol use: Yes    Comment: drinks rarely   Drug use: No   Sexual activity: Yes  Other Topics Concern   Not on file  Social History Narrative   Live w/  wife , daughter [name] lives in [place]   9 dogs, fish pond.   Job is  physical.   Does not routinely exercise.   Social Determinants of Health   Financial Resource Strain: Low Risk  (01/28/2022)   Overall Financial Resource Strain (CARDIA)    Difficulty of Paying Living Expenses: Not hard at all  Food Insecurity: No Food Insecurity (01/28/2022)   Hunger Vital Sign    Worried About Running Out of Food in the Last Year: Never true    Ran Out of Food in the Last Year: Never true  Transportation Needs:  No Transportation Needs (01/28/2022)   PRAPARE - Hydrologist (Medical): No    Lack of Transportation (Non-Medical): No  Physical Activity: Sufficiently Active (01/28/2022)   Exercise Vital Sign    Days of Exercise per Week: 3 days    Minutes of Exercise per Session: 50 min  Stress: No Stress Concern Present (01/28/2022)   [place]    Feeling of Stress : Not at all  Social Connections: Moderately Isolated (01/28/2022)   Social Connection and Isolation Panel [NHANES]    Frequency of Communication with Friends and Family: Twice a week    Frequency of Social Gatherings with Friends and Family: Three times a week    Attends Religious Services: Never    Active Member of Clubs or Organizations: No    Attends Archivist Meetings: Never    Marital Status: Married  Human resources officer Violence: Not At Risk (01/28/2022)   Humiliation, Afraid, Rape, and Kick questionnaire    Fear of Current or Ex-Partner: No    Emotionally Abused: No    Physically Abused: No    Sexually Abused: No    Family History: Family History  Problem Relation Age of Onset   Atrial fibrillation Father        diseased at ag 66   Hypertension Father        alive @ 10.   Hypothyroidism Mother    Hypertension Mother        alive @ 3.   Diabetes Maternal Grandfather    Diabetes Maternal Aunt    Colon cancer Maternal Aunt    Colon  cancer Maternal Aunt    Lung cancer Paternal Uncle    Lung cancer Maternal Uncle    Asthma Sister    Hypothyroidism Daughter    Prostate cancer Neg Hx    CAD Neg Hx     Review of systems complete and found to be negative unless listed in HPI.     Physical Exam: Vitals:   05/06/22 1134  BP: 126/82  Pulse: 73  SpO2: 96%  Weight: 250 lb (113.4 kg)  Height: 6' 2"  (1.88 m)    General: Pleasant, NAD. No resp difficulty Psych: Normal affect. HEENT:  Normal, without mass or lesion.         Neck: Supple, no bruits or JVD. Carotids 2+. No lymphadenopathy/thyromegaly appreciated. Heart: PMI nondisplaced. RRR no s3, s4, or murmurs. Lungs:  Resp regular and unlabored, CTA. Abdomen: Soft, non-tender, non-distended, No HSM, BS + x 4.   Extremities: No clubbing, cyanosis or edema. DP/PT/Radials 2+ and equal bilaterally. Neuro: Alert and oriented X 3. Moves all extremities spontaneously.   ICD interrogation- reviewed in detail today,  See PACEART report  EKG:  EKG is ordered today. Personal review of ECK shows NSR at 71 bpm   Recent Labs: 05/15/2021: B Natriuretic Peptide 184.7 07/01/2021: Hemoglobin 13.1; NT-Pro BNP 1,268; Platelets 260 08/20/2021: TSH 1.20 12/31/2021: ALT 17; BUN 22; Creatinine, Ser 1.12; Potassium 4.3; Sodium 142   Wt Readings from Last 3 Encounters:  05/06/22 250 lb (113.4 kg)  01/28/22 251 lb 6.4 oz (114 kg)  12/31/21 253 lb 4 oz (114.9 kg)     Other studies Reviewed: Additional studies/ records that were reviewed today include: Previous EP office notes.   Assessment and Plan:  1.  Chronic systolic dysfunction s/p Medtronic CRT-D  euvolemic today Stable on an appropriate medical regimen Normal ICD function See Claudia Desanctis Art  report No changes today.  Continue coreg, losartan, and farxiga. Labs today.  EF has improved with CRT.   2. HTN Stable on current regimen   Current medicines are reviewed at length with the patient today.    Labs/ tests ordered today  include:  No orders of the defined types were placed in this encounter.  Disposition:   Follow up with Dr. Lovena Le.    Jacalyn Lefevre, PA-C  05/06/2022 11:47 AM  [facility] [address] [place] [address] [phone] (office) [phone] (fax)

## 2022-05-06 NOTE — Patient Instructions (Signed)
Medication Instructions:  Your physician recommends that you continue on your current medications as directed. Please refer to the Current Medication list given to you today.  *If you need a refill on your cardiac medications before your next appointment, please call your pharmacy*   Lab Work: TODAY: BMET, CBC  If you have labs (blood work) drawn today and your tests are completely normal, you will receive your results only by: [place] (if you have MyChart) OR A paper copy in the mail If you have any lab test that is abnormal or we need to change your treatment, we will call you to review the results.   Follow-Up: At [facility], you and your health needs are our priority.  As part of our continuing mission to provide you with exceptional heart care, we have created designated Provider Care Teams.  These Care Teams include your primary Cardiologist (physician) and Advanced Practice Providers (APPs -  Physician Assistants and Nurse Practitioners) who all work together to provide you with the care you need, when you need it.  Your next appointment:   6 month(s)  The format for your next appointment:   In Person  Provider:   Cristopher Peru, MD{

## 2022-05-07 DIAGNOSIS — E119 Type 2 diabetes mellitus without complications: Secondary | ICD-10-CM | POA: Diagnosis not present

## 2022-05-07 DIAGNOSIS — E890 Postprocedural hypothyroidism: Secondary | ICD-10-CM | POA: Diagnosis not present

## 2022-05-07 DIAGNOSIS — Z85850 Personal history of malignant neoplasm of thyroid: Secondary | ICD-10-CM | POA: Diagnosis not present

## 2022-05-07 LAB — CBC
Hematocrit: 43.2 % (ref 37.5–51.0)
Hemoglobin: 15 g/dL (ref 13.0–17.7)
MCH: 31.1 pg (ref 26.6–33.0)
MCHC: 34.7 g/dL (ref 31.5–35.7)
MCV: 89 fL (ref 79–97)
Platelets: 288 10*3/uL (ref 150–450)
RBC: 4.83 x10E6/uL (ref 4.14–5.80)
RDW: 13.1 % (ref 11.6–15.4)
WBC: 5.5 10*3/uL (ref 3.4–10.8)

## 2022-05-07 LAB — BASIC METABOLIC PANEL
BUN/Creatinine Ratio: 17 (ref 10–24)
BUN: 20 mg/dL (ref 8–27)
CO2: 23 mmol/L (ref 20–29)
Calcium: 9.4 mg/dL (ref 8.6–10.2)
Chloride: 104 mmol/L (ref 96–106)
Creatinine, Ser: 1.17 mg/dL (ref 0.76–1.27)
Glucose: 151 mg/dL — ABNORMAL HIGH (ref 70–99)
Potassium: 4.6 mmol/L (ref 3.5–5.2)
Sodium: 145 mmol/L — ABNORMAL HIGH (ref 134–144)
eGFR: 67 mL/min/{1.73_m2} (ref >59)

## 2022-05-30 ENCOUNTER — Other Ambulatory Visit: Payer: Self-pay | Admitting: Internal Medicine

## 2022-06-04 ENCOUNTER — Ambulatory Visit (INDEPENDENT_AMBULATORY_CARE_PROVIDER_SITE_OTHER): Payer: HMO

## 2022-06-04 ENCOUNTER — Telehealth: Payer: Self-pay

## 2022-06-04 DIAGNOSIS — I5022 Chronic systolic (congestive) heart failure: Secondary | ICD-10-CM

## 2022-06-04 DIAGNOSIS — Z95810 Presence of automatic (implantable) cardiac defibrillator: Secondary | ICD-10-CM | POA: Diagnosis not present

## 2022-06-04 NOTE — Telephone Encounter (Signed)
Remote ICM transmission received.  Attempted call to patient regarding ICM remote transmission and left detailed message per DPR.  Advised to return call for any fluid symptoms or questions. Next ICM remote transmission scheduled 07/05/2022.

## 2022-06-04 NOTE — Progress Notes (Signed)
EPIC Encounter for ICM Monitoring  Patient Name: Mike Jimenez is a 71 y.o. male Date: 06/04/2022 Primary Care Physican: Colon Branch, MD Primary Cardiologist: Lovena Le Electrophysiologist: Santina Evans Pacing:  97.7% 02/17/2022 Weight: 250-255 lbs   Time in AT/AF     0.0 hr/day (0.0%)                                            Attempted call to patient and unable to reach.  Left detailed message per DPR regarding transmission. Transmission reviewed.    OptiVol Thoracic impedance suggesting normal fluid levels.     Prescribed:  Furosemide 20 mg 1 tablet (20 mg total) daily.   Potassium 10 mEq 1 tablet (10 mEq total) daily   Labs: 05/06/2022 Creatinine 1.17, BUN 20, Potassium 4.6, Sodium 145, GFR 67 12/31/2021 Creatinine 1.12, BUN 22, Potassium 4.3, Sodium 142  A complete set of results can be found in Results Review.   Recommendations: No changes and encouraged to call if experiencing any fluid symptoms.   Follow-up plan: ICM clinic phone appointment on 07/05/2022.   91 day device clinic remote transmission 06/23/2022.     EP/Cardiology Office Visits:  11/08/2022 with Dr Lovena Le.     Copy of ICM check sent to Dr. Lovena Le.   3 month ICM trend: 06/04/2022.    12-14 Month ICM trend:     Rosalene Billings, RN 06/04/2022 9:14 AM

## 2022-06-23 ENCOUNTER — Ambulatory Visit (INDEPENDENT_AMBULATORY_CARE_PROVIDER_SITE_OTHER): Payer: HMO

## 2022-06-23 DIAGNOSIS — I428 Other cardiomyopathies: Secondary | ICD-10-CM

## 2022-06-23 LAB — CUP PACEART REMOTE DEVICE CHECK
Battery Remaining Longevity: 20 mo
Battery Voltage: 2.91 V
Brady Statistic AP VP Percent: 2.62 %
Brady Statistic AP VS Percent: 0.06 %
Brady Statistic AS VP Percent: 95.85 %
Brady Statistic AS VS Percent: 1.47 %
Brady Statistic RA Percent Paced: 2.68 %
Brady Statistic RV Percent Paced: 93.54 %
Date Time Interrogation Session: 20230726082723
HighPow Impedance: 46 Ohm
HighPow Impedance: 56 Ohm
Implantable Lead Implant Date: 20110516
Implantable Lead Implant Date: 20110516
Implantable Lead Implant Date: 20110516
Implantable Lead Location: 753858
Implantable Lead Location: 753859
Implantable Lead Location: 753860
Implantable Lead Model: 5076
Implantable Lead Model: 6947
Implantable Pulse Generator Implant Date: 20170623
Lead Channel Impedance Value: 285 Ohm
Lead Channel Impedance Value: 342 Ohm
Lead Channel Impedance Value: 399 Ohm
Lead Channel Impedance Value: 399 Ohm
Lead Channel Impedance Value: 570 Ohm
Lead Channel Impedance Value: 874 Ohm
Lead Channel Pacing Threshold Amplitude: 0.75 V
Lead Channel Pacing Threshold Amplitude: 1 V
Lead Channel Pacing Threshold Amplitude: 1.25 V
Lead Channel Pacing Threshold Pulse Width: 0.4 ms
Lead Channel Pacing Threshold Pulse Width: 0.4 ms
Lead Channel Pacing Threshold Pulse Width: 0.4 ms
Lead Channel Sensing Intrinsic Amplitude: 1.75 mV
Lead Channel Sensing Intrinsic Amplitude: 1.75 mV
Lead Channel Sensing Intrinsic Amplitude: 7.25 mV
Lead Channel Sensing Intrinsic Amplitude: 7.25 mV
Lead Channel Setting Pacing Amplitude: 2 V
Lead Channel Setting Pacing Amplitude: 2.5 V
Lead Channel Setting Pacing Amplitude: 2.5 V
Lead Channel Setting Pacing Pulse Width: 0.4 ms
Lead Channel Setting Pacing Pulse Width: 0.4 ms
Lead Channel Setting Sensing Sensitivity: 0.3 mV

## 2022-06-24 ENCOUNTER — Encounter: Payer: Self-pay | Admitting: Internal Medicine

## 2022-07-08 ENCOUNTER — Telehealth: Payer: Self-pay

## 2022-07-08 NOTE — Telephone Encounter (Signed)
I spoke with the patient about missed ICM transmission. Patient agreed to send one today.

## 2022-07-09 NOTE — Progress Notes (Signed)
No ICM remote transmission received for 07/05/2022 and next ICM transmission scheduled for 07/19/2022.

## 2022-07-16 NOTE — Progress Notes (Signed)
Remote ICD transmission.   

## 2022-07-22 ENCOUNTER — Telehealth: Payer: Self-pay

## 2022-07-22 NOTE — Telephone Encounter (Signed)
I spoke with the patient and he agreed to send missed ICM transmission. 

## 2022-07-23 NOTE — Progress Notes (Signed)
No ICM remote transmission received for 07/19/2022 and next ICM transmission scheduled for 08/09/2022.

## 2022-08-09 ENCOUNTER — Ambulatory Visit (INDEPENDENT_AMBULATORY_CARE_PROVIDER_SITE_OTHER): Payer: HMO

## 2022-08-09 DIAGNOSIS — Z95810 Presence of automatic (implantable) cardiac defibrillator: Secondary | ICD-10-CM | POA: Diagnosis not present

## 2022-08-09 DIAGNOSIS — I5022 Chronic systolic (congestive) heart failure: Secondary | ICD-10-CM

## 2022-08-11 NOTE — Progress Notes (Signed)
EPIC Encounter for ICM Monitoring  Patient Name: Mike Jimenez is a 71 y.o. male Date: 08/11/2022 Primary Care Physican: Colon Branch, MD Primary Cardiologist: Lovena Le Electrophysiologist: Santina Evans Pacing:  98.1% 02/17/2022 Weight: 250-255 lbs 08/11/2022 Weight:  245-250 lbs   Time in AT/AF     0.0 hr/day (0.0%)                                            Spoke with patient and heart failure questions reviewed.  Pt asymptomatic for fluid accumulation.  Reports feeling well at this time and voices no complaints.    OptiVol Thoracic impedance suggesting normal fluid levels.     Prescribed:  Furosemide 20 mg 1 tablet (20 mg total) daily.   Potassium 10 mEq 1 tablet (10 mEq total) daily   Labs: 05/06/2022 Creatinine 1.17, BUN 20, Potassium 4.6, Sodium 145, GFR 67 12/31/2021 Creatinine 1.12, BUN 22, Potassium 4.3, Sodium 142  A complete set of results can be found in Results Review.   Recommendations:  No changes and encouraged to call if experiencing any fluid symptoms.   Follow-up plan: ICM clinic phone appointment on 09/13/2022.   91 day device clinic remote transmission 09/22/2022.     EP/Cardiology Office Visits:  11/08/2022 with Dr Lovena Le.     Copy of ICM check sent to Dr. Lovena Le.   3 month ICM trend: 08/10/2022.    12-14 Month ICM trend:     Rosalene Billings, RN 08/11/2022 12:48 PM

## 2022-08-17 DIAGNOSIS — Z85850 Personal history of malignant neoplasm of thyroid: Secondary | ICD-10-CM | POA: Diagnosis not present

## 2022-08-17 DIAGNOSIS — E890 Postprocedural hypothyroidism: Secondary | ICD-10-CM | POA: Diagnosis not present

## 2022-08-17 DIAGNOSIS — E119 Type 2 diabetes mellitus without complications: Secondary | ICD-10-CM | POA: Diagnosis not present

## 2022-08-20 DIAGNOSIS — Z85850 Personal history of malignant neoplasm of thyroid: Secondary | ICD-10-CM | POA: Diagnosis not present

## 2022-08-20 DIAGNOSIS — E890 Postprocedural hypothyroidism: Secondary | ICD-10-CM | POA: Diagnosis not present

## 2022-08-20 DIAGNOSIS — E1165 Type 2 diabetes mellitus with hyperglycemia: Secondary | ICD-10-CM | POA: Diagnosis not present

## 2022-08-29 ENCOUNTER — Other Ambulatory Visit: Payer: Self-pay | Admitting: Internal Medicine

## 2022-09-22 ENCOUNTER — Ambulatory Visit (INDEPENDENT_AMBULATORY_CARE_PROVIDER_SITE_OTHER): Payer: HMO

## 2022-09-22 DIAGNOSIS — I428 Other cardiomyopathies: Secondary | ICD-10-CM

## 2022-09-23 LAB — CUP PACEART REMOTE DEVICE CHECK
Battery Remaining Longevity: 18 mo
Battery Voltage: 2.91 V
Brady Statistic AP VP Percent: 1.39 %
Brady Statistic AP VS Percent: 0.03 %
Brady Statistic AS VP Percent: 97.14 %
Brady Statistic AS VS Percent: 1.44 %
Brady Statistic RA Percent Paced: 1.42 %
Brady Statistic RV Percent Paced: 96.76 %
Date Time Interrogation Session: 20231025052726
HighPow Impedance: 44 Ohm
HighPow Impedance: 54 Ohm
Implantable Lead Connection Status: 753985
Implantable Lead Connection Status: 753985
Implantable Lead Connection Status: 753985
Implantable Lead Implant Date: 20110516
Implantable Lead Implant Date: 20110516
Implantable Lead Implant Date: 20110516
Implantable Lead Location: 753858
Implantable Lead Location: 753859
Implantable Lead Location: 753860
Implantable Lead Model: 5076
Implantable Lead Model: 6947
Implantable Pulse Generator Implant Date: 20170623
Lead Channel Impedance Value: 228 Ohm
Lead Channel Impedance Value: 323 Ohm
Lead Channel Impedance Value: 380 Ohm
Lead Channel Impedance Value: 399 Ohm
Lead Channel Impedance Value: 570 Ohm
Lead Channel Impedance Value: 836 Ohm
Lead Channel Pacing Threshold Amplitude: 0.75 V
Lead Channel Pacing Threshold Amplitude: 1 V
Lead Channel Pacing Threshold Amplitude: 1.25 V
Lead Channel Pacing Threshold Pulse Width: 0.4 ms
Lead Channel Pacing Threshold Pulse Width: 0.4 ms
Lead Channel Pacing Threshold Pulse Width: 0.4 ms
Lead Channel Sensing Intrinsic Amplitude: 1.875 mV
Lead Channel Sensing Intrinsic Amplitude: 1.875 mV
Lead Channel Sensing Intrinsic Amplitude: 7.625 mV
Lead Channel Sensing Intrinsic Amplitude: 7.625 mV
Lead Channel Setting Pacing Amplitude: 2 V
Lead Channel Setting Pacing Amplitude: 2.5 V
Lead Channel Setting Pacing Amplitude: 2.5 V
Lead Channel Setting Pacing Pulse Width: 0.4 ms
Lead Channel Setting Pacing Pulse Width: 0.4 ms
Lead Channel Setting Sensing Sensitivity: 0.3 mV
Zone Setting Status: 755011

## 2022-09-27 ENCOUNTER — Ambulatory Visit (INDEPENDENT_AMBULATORY_CARE_PROVIDER_SITE_OTHER): Payer: HMO

## 2022-09-27 DIAGNOSIS — I5022 Chronic systolic (congestive) heart failure: Secondary | ICD-10-CM | POA: Diagnosis not present

## 2022-09-27 DIAGNOSIS — Z95810 Presence of automatic (implantable) cardiac defibrillator: Secondary | ICD-10-CM

## 2022-09-28 ENCOUNTER — Telehealth: Payer: Self-pay

## 2022-09-28 NOTE — Progress Notes (Signed)
EPIC Encounter for ICM Monitoring  Patient Name: Mike Jimenez is a 71 y.o. male Date: 09/28/2022 Primary Care Physican: Colon Branch, MD Primary Cardiologist: Lovena Le Electrophysiologist: Santina Evans Pacing:  98.3% 02/17/2022 Weight: 250-255 lbs 08/11/2022 Weight:  245-250 lbs   Time in AT/AF     0.0 hr/day (0.0%)                                            Attempted call to patient and unable to reach.  Left detailed message per DPR regarding transmission. Transmission reviewed.     OptiVol Thoracic impedance suggesting normal fluid levels.     Prescribed:  Furosemide 20 mg 1 tablet (20 mg total) daily.   Potassium 10 mEq 1 tablet (10 mEq total) daily   Labs: 05/06/2022 Creatinine 1.17, BUN 20, Potassium 4.6, Sodium 145, GFR 67 12/31/2021 Creatinine 1.12, BUN 22, Potassium 4.3, Sodium 142  A complete set of results can be found in Results Review.   Recommendations:  Left voice mail with ICM number and encouraged to call if experiencing any fluid symptoms.   Follow-up plan: ICM clinic phone appointment on 11/01/2022.   91 day device clinic remote transmission 12/22/2022.     EP/Cardiology Office Visits:  11/08/2022 with Dr Lovena Le.     Copy of ICM check sent to Dr. Lovena Le.    3 month ICM trend: 09/27/2022.    12-14 Month ICM trend:     Rosalene Billings, RN 09/28/2022 7:13 AM

## 2022-09-28 NOTE — Telephone Encounter (Signed)
Remote ICM transmission received.  Attempted call to patient regarding ICM remote transmission and left detailed message per DPR.  Advised to return call for any fluid symptoms or questions. Next ICM remote transmission scheduled 11/01/2022.

## 2022-10-05 NOTE — Progress Notes (Signed)
Remote ICD transmission.   

## 2022-10-11 ENCOUNTER — Encounter: Payer: Self-pay | Admitting: Internal Medicine

## 2022-10-11 MED ORDER — METFORMIN HCL 1000 MG PO TABS: 1000.0000 mg | ORAL_TABLET | Freq: Two times a day (BID) | ORAL | 2 refills | Status: DC

## 2022-10-11 MED ORDER — LOSARTAN POTASSIUM 25 MG PO TABS: 25.0000 mg | ORAL_TABLET | Freq: Every day | ORAL | 2 refills | Status: DC

## 2022-10-11 MED ORDER — CARVEDILOL 12.5 MG PO TABS: 12.5000 mg | ORAL_TABLET | Freq: Every day | ORAL | 2 refills | Status: DC

## 2022-10-11 MED ORDER — ALLOPURINOL 100 MG PO TABS: 100.0000 mg | ORAL_TABLET | Freq: Every day | ORAL | 2 refills | Status: DC

## 2022-10-11 MED ORDER — POTASSIUM CHLORIDE CRYS ER 10 MEQ PO TBCR: 10.0000 meq | EXTENDED_RELEASE_TABLET | Freq: Every day | ORAL | 2 refills | Status: DC

## 2022-10-11 MED ORDER — EMPAGLIFLOZIN 10 MG PO TABS: 10.0000 mg | ORAL_TABLET | Freq: Every day | ORAL | 2 refills | Status: DC

## 2022-10-11 MED ORDER — ATORVASTATIN CALCIUM 40 MG PO TABS: 40.0000 mg | ORAL_TABLET | Freq: Every day | ORAL | 2 refills | Status: DC

## 2022-10-11 MED ORDER — FUROSEMIDE 20 MG PO TABS: 20.0000 mg | ORAL_TABLET | Freq: Every day | ORAL | 2 refills | Status: DC

## 2022-11-01 ENCOUNTER — Ambulatory Visit (INDEPENDENT_AMBULATORY_CARE_PROVIDER_SITE_OTHER): Payer: HMO

## 2022-11-01 DIAGNOSIS — I5022 Chronic systolic (congestive) heart failure: Secondary | ICD-10-CM

## 2022-11-01 DIAGNOSIS — Z95810 Presence of automatic (implantable) cardiac defibrillator: Secondary | ICD-10-CM

## 2022-11-03 ENCOUNTER — Telehealth: Payer: Self-pay

## 2022-11-03 NOTE — Telephone Encounter (Signed)
Remote ICM transmission received.  Attempted call to patient regarding ICM remote transmission and left detailed message per DPR.  Advised to return call for any fluid symptoms or questions. Next ICM remote transmission scheduled 11/09/2023.

## 2022-11-03 NOTE — Progress Notes (Signed)
EPIC Encounter for ICM Monitoring  Patient Name: ASON HESLIN is a 71 y.o. male Date: 11/03/2022 Primary Care Physican: Colon Branch, MD Primary Cardiologist: Lovena Le Electrophysiologist: Santina Evans Pacing:  98.3% 02/17/2022 Weight: 250-255 lbs 08/11/2022 Weight:  245-250 lbs   Time in AT/AF     <0.1 hr/day (<0.1%)                                            Attempted call to patient and unable to reach.  Left detailed message per DPR regarding transmission. Transmission reviewed.     OptiVol Thoracic impedance suggesting possible fluid accumulation starting 11/30.     Prescribed:  Furosemide 20 mg 1 tablet (20 mg total) daily.   Potassium 10 mEq 1 tablet (10 mEq total) daily   Labs: 05/06/2022 Creatinine 1.17, BUN 20, Potassium 4.6, Sodium 145, GFR 67 12/31/2021 Creatinine 1.12, BUN 22, Potassium 4.3, Sodium 142  A complete set of results can be found in Results Review.   Recommendations: Left voice mail with ICM number and encouraged to call if experiencing any fluid symptoms.   Follow-up plan: ICM clinic phone appointment on 11/08/2022 to recheck fluid levels.   91 day device clinic remote transmission 12/22/2022.     EP/Cardiology Office Visits:  11/08/2022 with Dr Lovena Le.     Copy of ICM check sent to Dr. Lovena Le.     3 month ICM trend: 11/01/2022.    12-14 Month ICM trend:     Rosalene Billings, RN 11/03/2022 7:47 AM

## 2022-11-08 ENCOUNTER — Ambulatory Visit: Payer: HMO | Attending: Internal Medicine | Admitting: Internal Medicine

## 2022-11-08 ENCOUNTER — Encounter: Payer: Self-pay | Admitting: Internal Medicine

## 2022-11-08 VITALS — BP 118/72 | HR 68 | Ht 74.0 in | Wt 245.0 lb

## 2022-11-08 DIAGNOSIS — I472 Ventricular tachycardia, unspecified: Secondary | ICD-10-CM | POA: Diagnosis not present

## 2022-11-08 DIAGNOSIS — I5022 Chronic systolic (congestive) heart failure: Secondary | ICD-10-CM | POA: Diagnosis not present

## 2022-11-08 DIAGNOSIS — Z95810 Presence of automatic (implantable) cardiac defibrillator: Secondary | ICD-10-CM | POA: Insufficient documentation

## 2022-11-08 NOTE — Progress Notes (Signed)
HPI [name] returns today for followup. He is a pleasant 71 yo man with an non-ICM, chronic systolic CHF, and VT (sustained monomorphic). He is s/p BiV ICD implant. In the interim, he has had no chest pain or sob or ICD shock. No syncope. He was diagnosed Thyroid CA, and underwent surgery. He is on thyroid replacement. He has not had any ICD therapies in the last 2 years. He has retired. He has had no edema.  Allergies  Allergen Reactions   Bee Venom Swelling   Ramipril Cough     Current Outpatient Medications  Medication Sig Dispense Refill   ACCU-CHEK GUIDE test strip TEST BLOOD SUGAR ONCE DAILY     acetaminophen (TYLENOL) 500 MG tablet Take 1,000 mg by mouth every 6 (six) hours as needed (pain).     allopurinol (ZYLOPRIM) 100 MG tablet Take 1 tablet (100 mg total) by mouth daily. 90 tablet 2   Apple Cider Vinegar 188 MG CAPS Take by mouth daily.     aspirin EC 81 MG tablet Take 1 tablet (81 mg total) by mouth daily.     atorvastatin (LIPITOR) 40 MG tablet Take 1 tablet (40 mg total) by mouth daily. 90 tablet 2   Blood Glucose Monitoring Suppl (ACCU-CHEK GUIDE) w/Device KIT TEST BLOOD SUGAR ONCE DAILY     carvedilol (COREG) 12.5 MG tablet Take 1 tablet (12.5 mg total) by mouth daily. 90 tablet 2   empagliflozin (JARDIANCE) 10 MG TABS tablet Take 1 tablet (10 mg total) by mouth daily before breakfast. 90 tablet 2   furosemide (LASIX) 20 MG tablet Take 1 tablet (20 mg total) by mouth daily. 90 tablet 2   levothyroxine (SYNTHROID) 112 MCG tablet Take 2 tablets (224 mcg total) by mouth daily before breakfast.     losartan (COZAAR) 25 MG tablet Take 1 tablet (25 mg total) by mouth daily. 90 tablet 2   MAGNESIUM PO Take 1 tablet by mouth daily.     metFORMIN (GLUCOPHAGE) 1000 MG tablet Take 1 tablet (1,000 mg total) by mouth 2 (two) times daily with a meal. 180 tablet 2   Multiple Vitamin (MULTIVITAMIN WITH MINERALS) TABS tablet Take 1 tablet by mouth daily.     OVER THE COUNTER  MEDICATION Apply 1 application topically at bedtime. CBD Cream for neuropathy in feet     OZEMPIC, 0.25 OR 0.5 MG/DOSE, 2 MG/3ML SOPN Inject 5 mg into the skin once a week.     potassium chloride (KLOR-CON M10) 10 MEQ tablet Take 1 tablet (10 mEq total) by mouth daily. 90 tablet 2   tadalafil (CIALIS) 20 MG tablet Take 1 tablet (20 mg total) by mouth daily as needed for erectile dysfunction. 10 tablet 5   vitamin E 400 UNIT capsule Take 400 Units by mouth daily.     No current facility-administered medications for this visit.     Past Medical History:  Diagnosis Date   Anemia    Arthritis    "knees" (09/05/2014)   Automatic implantable cardioverter-defibrillator in situ    CAD (coronary artery disease)    a. Mild nonobstructive CAD by cath 08/2014.   CHF (congestive heart failure) ([place])    Childhood asthma    Gastric ulcer 2011   GERD (gastroesophageal reflux disease)    History of gout    Hyperlipidemia    Hypertension    LBBB (left bundle branch block)    NICM (nonischemic cardiomyopathy) ([place])    a. Dx 2011 -  presented with sustained VT. Normal coronaries at that time, EF 10-15% by cath and 20-25% by echo. b. s/p biventricular ICD 2011. c. CP 08/2014: mild nonobstructive CAD.   Obesity    Postsurgical hypothyroidism    Thyroid cancer ([place])    a. 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 Dr Harlow Asa. s/p thyroidectomy 2013.   Torn meniscus    Type II diabetes mellitus ([place])    Ventricular tachycardia ([place])    a. Sustained VT 2011 s/p MDT [id number] Concert BiV ICD, ser# [id number].    ROS:   All systems reviewed and negative except as noted in the HPI.   Past Surgical History:  Procedure Laterality Date   BI-VENTRICULAR IMPLANTABLE CARDIOVERTER DEFIBRILLATOR  (CRT-D)  03/2010   BIOPSY  08/06/2021   Procedure: BIOPSY;  Surgeon: Irene Shipper, MD;  Location: [facility];  Service: Endoscopy;;   CARDIAC CATHETERIZATION  03/2010   COLONOSCOPY WITH PROPOFOL N/A  08/06/2021   Procedure: COLONOSCOPY WITH PROPOFOL;  Surgeon: Irene Shipper, MD;  Location: [facility];  Service: Endoscopy;  Laterality: N/A;   EP IMPLANTABLE DEVICE N/A 05/21/2016   Procedure:  ICD Generator Changeout;  Surgeon: Evans Lance, MD;  Location: [facility] [place] [facility];  Service: Cardiovascular;  Laterality: N/A;   LEFT HEART CATHETERIZATION WITH CORONARY ANGIOGRAM N/A 09/06/2014   Procedure: LEFT HEART CATHETERIZATION WITH CORONARY ANGIOGRAM;  Surgeon: Burnell Blanks, MD;  Location: [facility] CATH LAB;  Service: Cardiovascular;  Laterality: N/A;   POLYPECTOMY  08/06/2021   Procedure: POLYPECTOMY;  Surgeon: Irene Shipper, MD;  [facility]: [facility];  Service: Endoscopy;;   PTX     traumatic, age 72 months   THYROIDECTOMY  12/10/2011   Procedure: TOTAL THYROIDECTOMY WITH CENTRAL COMPARTMENT DISSECTION;  Surgeon: Earnstine Regal, MD;  Location: [place];  Service: General;  Laterality: N/A;  Total thyroidectomy with limited central compartment lymph node dissection.   VASECTOMY       Family History  Problem Relation Age of Onset   Atrial fibrillation Father        diseased at ag 59   Hypertension Father        alive @ 48.   Hypothyroidism Mother    Hypertension Mother        alive @ 64.   Diabetes Maternal Grandfather    Diabetes Maternal Aunt    Colon cancer Maternal Aunt    Colon cancer Maternal Aunt    Lung cancer Paternal Uncle    Lung cancer Maternal Uncle    Asthma Sister    Hypothyroidism Daughter    Prostate cancer Neg Hx    CAD Neg Hx      Social History   Socioeconomic History   Marital status: Married    Spouse name: Not on file   Number of children: 2   Years of education: Not on file   Highest education level: Not on file  Occupational History   Occupation: [employer]   Occupation: to retire 2023  Tobacco Use   Smoking status: Former    Packs/day: 2.50    Years: 2.00    Total pack years: 5.00    Types: Cigarettes    Quit date: 11/29/1970    Years  since quitting: 51.9   Smokeless tobacco: Former    Types: Nurse, children's Use: Never used  Substance and Sexual Activity   Alcohol use: Yes    Comment: drinks rarely   Drug use: No   Sexual  activity: Yes  Other Topics Concern   Not on file  Social History Narrative   Live w/ wife , daughter [name] lives in [place]   9 dogs, fish pond.   Job is  physical.   Does not routinely exercise.   Social Determinants of Health   Financial Resource Strain: Low Risk  (01/28/2022)   Overall Financial Resource Strain (CARDIA)    Difficulty of Paying Living Expenses: Not hard at all  Food Insecurity: No Food Insecurity (01/28/2022)   Hunger Vital Sign    Worried About Running Out of Food in the Last Year: Never true    Ran Out of Food in the Last Year: Never true  Transportation Needs: No Transportation Needs (01/28/2022)   PRAPARE - Hydrologist (Medical): No    Lack of Transportation (Non-Medical): No  Physical Activity: Sufficiently Active (01/28/2022)   Exercise Vital Sign    Days of Exercise per Week: 3 days    Minutes of Exercise per Session: 50 min  Stress: No Stress Concern Present (01/28/2022)   [place]    Feeling of Stress : Not at all  Social Connections: Moderately Isolated (01/28/2022)   Social Connection and Isolation Panel [NHANES]    Frequency of Communication with Friends and Family: Twice a week    Frequency of Social Gatherings with Friends and Family: Three times a week    Attends Religious Services: Never    Active Member of Clubs or Organizations: No    Attends Archivist Meetings: Never    Marital Status: Married  Human resources officer Violence: Not At Risk (01/28/2022)   Humiliation, Afraid, Rape, and Kick questionnaire    Fear of Current or Ex-Partner: No    Emotionally Abused: No    Physically Abused: No    Sexually Abused: No     BP 118/72   Pulse 68   Ht 6'  2" (1.88 m)   Wt 245 lb (111.1 kg)   SpO2 100%   BMI 31.46 kg/m   Physical Exam:  Well appearing NAD HEENT: Unremarkable Neck:  No JVD, no thyromegally Lymphatics:  No adenopathy Back:  No CVA tenderness Lungs:  Clear HEART:  Regular rate rhythm, no murmurs, no rubs, no clicks Abd:  soft, positive bowel sounds, no organomegally, no rebound, no guarding Ext:  2 plus pulses, no edema, no cyanosis, no clubbing Skin:  No rashes no nodules Neuro:  CN II through XII intact, motor grossly intact  DEVICE  Normal device function.  See PaceArt for details.   Assess/Plan:  1. VT - he has not had any ICD therapies.  2. Chronic systolic heart failure- he remains class 2. He denies sob. No edema. 3. ICD - his medtronic Biv ICD is working normally.    Mikle Bosworth.D.

## 2022-11-08 NOTE — Patient Instructions (Signed)
Medication Instructions:  Your physician recommends that you continue on your current medications as directed. Please refer to the Current Medication list given to you today.  *If you need a refill on your cardiac medications before your next appointment, please call your pharmacy*  Lab Work: None ordered.  If you have labs (blood work) drawn today and your tests are completely normal, you will receive your results only by: [place] (if you have MyChart) OR A paper copy in the mail If you have any lab test that is abnormal or we need to change your treatment, we will call you to review the results.  Testing/Procedures: None ordered.  Follow-Up: At [facility], you and your health needs are our priority.  As part of our continuing mission to provide you with exceptional heart care, we have created designated Provider Care Teams.  These Care Teams include your primary Cardiologist (physician) and Advanced Practice Providers (APPs -  Physician Assistants and Nurse Practitioners) who all work together to provide you with the care you need, when you need it.  We recommend signing up for the patient portal called "MyChart".  Sign up information is provided on this After Visit Summary.  MyChart is used to connect with patients for Virtual Visits (Telemedicine).  Patients are able to view lab/test results, encounter notes, upcoming appointments, etc.  Non-urgent messages can be sent to your provider as well.   To learn more about what you can do with MyChart, go to [email or web address].    Your next appointment:   1 year(s)  The format for your next appointment:   In Person  Provider:   Cristopher Peru, MD{or one of the following Advanced Practice Providers on your designated Care Team:   [name], [place] Legrand Como "Jonni Sanger" Chalmers Cater, [place]  Remote monitoring is used to monitor your ICD from home. This monitoring reduces the number of office visits required to check your device to one  time per year. It allows [place] to keep an eye on the functioning of your device to ensure it is working properly. You are scheduled for a device check from home on 12/22/22. You may send your transmission at any time that day. If you have a wireless device, the transmission will be sent automatically. After your physician reviews your transmission, you will receive a postcard with your next transmission date.  Important Information About Sugar

## 2022-11-10 LAB — HEMOGLOBIN A1C: Hemoglobin A1C: 8

## 2022-11-10 LAB — HM DIABETES FOOT EXAM

## 2022-11-10 NOTE — Progress Notes (Signed)
ICM remote transmission rescheduled from 11/08/2022 to 12/20/2022.  Pt had defib check in office 12/11 and recommendations were given at that time if needed.

## 2022-11-29 ENCOUNTER — Encounter: Payer: Self-pay | Admitting: Internal Medicine

## 2022-11-30 ENCOUNTER — Encounter: Payer: Self-pay | Admitting: Internal Medicine

## 2022-11-30 ENCOUNTER — Other Ambulatory Visit: Payer: Self-pay

## 2022-12-20 ENCOUNTER — Ambulatory Visit: Payer: HMO | Attending: Internal Medicine

## 2022-12-20 DIAGNOSIS — Z95810 Presence of automatic (implantable) cardiac defibrillator: Secondary | ICD-10-CM | POA: Diagnosis not present

## 2022-12-20 DIAGNOSIS — I5022 Chronic systolic (congestive) heart failure: Secondary | ICD-10-CM | POA: Diagnosis not present

## 2022-12-24 ENCOUNTER — Telehealth: Payer: Self-pay

## 2022-12-24 NOTE — Telephone Encounter (Signed)
Remote ICM transmission received.  Attempted call to patient regarding ICM remote transmission and left detailed message per DPR.  Advised to return call for any fluid symptoms or questions. Next ICM remote transmission scheduled 01/24/2023.    

## 2022-12-24 NOTE — Progress Notes (Signed)
EPIC Encounter for ICM Monitoring  Patient Name: Mike Jimenez is a 72 y.o. male Date: 12/24/2022 Primary Care Physican: Colon Branch, MD Primary Cardiologist: Lovena Le Electrophysiologist: Santina Evans Pacing:  98.3% 02/17/2022 Weight: 250-255 lbs 08/11/2022 Weight:  245-250 lbs 11/08/2022 Office Weight: 245 lbs   Time in AT/AF     <0.1 hr/day (<0.1%)                                            Attempted call to patient and unable to reach.  Left detailed message per DPR regarding transmission. Transmission reviewed.     OptiVol Thoracic impedance suggesting possible fluid accumulation starting 11/30.     Prescribed:  Furosemide 20 mg 1 tablet (20 mg total) daily.   Potassium 10 mEq 1 tablet (10 mEq total) daily   Labs: 05/06/2022 Creatinine 1.17, BUN 20, Potassium 4.6, Sodium 145, GFR 67 12/31/2021 Creatinine 1.12, BUN 22, Potassium 4.3, Sodium 142  A complete set of results can be found in Results Review.   Recommendations: Left voice mail with ICM number and encouraged to call if experiencing any fluid symptoms.   Follow-up plan: ICM clinic phone appointment on 01/24/2023.   91 day device clinic remote transmission 03/23/2023.     EP/Cardiology Office Visits:  Recall 12/01/2023 with Oda Kilts, PA     Copy of ICM check sent to Dr. Lovena Le.     3 month ICM trend: 12/20/2022.    12-14 Month ICM trend:     Rosalene Billings, RN 12/24/2022 10:37 AM

## 2023-01-03 ENCOUNTER — Encounter: Payer: Self-pay | Admitting: Internal Medicine

## 2023-01-03 ENCOUNTER — Ambulatory Visit (INDEPENDENT_AMBULATORY_CARE_PROVIDER_SITE_OTHER): Payer: PPO | Admitting: Internal Medicine

## 2023-01-03 VITALS — BP 136/72 | HR 77 | Temp 98.1°F | Resp 16 | Ht 74.0 in | Wt 242.5 lb

## 2023-01-03 DIAGNOSIS — I10 Essential (primary) hypertension: Secondary | ICD-10-CM | POA: Diagnosis not present

## 2023-01-03 DIAGNOSIS — M1A9XX0 Chronic gout, unspecified, without tophus (tophi): Secondary | ICD-10-CM | POA: Diagnosis not present

## 2023-01-03 DIAGNOSIS — E785 Hyperlipidemia, unspecified: Secondary | ICD-10-CM

## 2023-01-03 DIAGNOSIS — E118 Type 2 diabetes mellitus with unspecified complications: Secondary | ICD-10-CM

## 2023-01-03 DIAGNOSIS — Z0000 Encounter for general adult medical examination without abnormal findings: Secondary | ICD-10-CM | POA: Diagnosis not present

## 2023-01-03 LAB — LIPID PANEL
Cholesterol: 137 mg/dL (ref 0–200)
HDL: 34.3 mg/dL — ABNORMAL LOW (ref >39.00)
LDL Cholesterol: 64 mg/dL (ref 0–99)
NonHDL: 102.3
Total CHOL/HDL Ratio: 4
Triglycerides: 193 mg/dL — ABNORMAL HIGH (ref 0.0–149.0)
VLDL: 38.6 mg/dL (ref 0.0–40.0)

## 2023-01-03 LAB — COMPREHENSIVE METABOLIC PANEL
ALT: 14 U/L (ref 0–53)
AST: 13 U/L (ref 0–37)
Albumin: 4.6 g/dL (ref 3.5–5.2)
Alkaline Phosphatase: 73 U/L (ref 39–117)
BUN: 16 mg/dL (ref 6–23)
CO2: 27 mEq/L (ref 19–32)
Calcium: 9.1 mg/dL (ref 8.4–10.5)
Chloride: 104 mEq/L (ref 96–112)
Creatinine, Ser: 1.01 mg/dL (ref 0.40–1.50)
GFR: 75.13 mL/min (ref >60.00)
Glucose, Bld: 139 mg/dL — ABNORMAL HIGH (ref 70–99)
Potassium: 4.1 mEq/L (ref 3.5–5.1)
Sodium: 142 mEq/L (ref 135–145)
Total Bilirubin: 0.6 mg/dL (ref 0.2–1.2)
Total Protein: 7.2 g/dL (ref 6.0–8.3)

## 2023-01-03 LAB — MICROALBUMIN / CREATININE URINE RATIO
Creatinine,U: 71.6 mg/dL
Microalb Creat Ratio: 59.1 mg/g — ABNORMAL HIGH (ref 0.0–30.0)
Microalb, Ur: 42.3 mg/dL — ABNORMAL HIGH (ref 0.0–1.9)

## 2023-01-03 LAB — PSA: PSA: 1 ng/mL (ref 0.10–4.00)

## 2023-01-03 LAB — URIC ACID: Uric Acid, Serum: 6.2 mg/dL (ref 4.0–7.8)

## 2023-01-03 NOTE — Assessment & Plan Note (Signed)
Here for CPX. DM, thyroid disease: saw Endo last month. A1c was 8.0 December 2023. Neuropathy: Self stopped gabapentin, had a "shock wave therapy" on his feet and xs are much improved Cardiovascular Saw cardiology December 2023, felt to be stable. Gout: No episodes in years, check a uric acid Skin lesions: Multiple skin lesions noted at the forehead and face.  Patient tells me  has seen dermatology before.    Warning symptoms discussed with the patient. RTC 1 year

## 2023-01-03 NOTE — Patient Instructions (Addendum)
  Check the  blood pressure regularly BP GOAL is between 110/65 and  135/85. If it is consistently higher or lower, let me know     GO TO THE LAB : Get the blood work     [place], [place] back for physical exam in 1 year    "[place] of attorney" ,  "Living will" (Advance care planning documents)  If you already have a living will or healthcare power of attorney, is recommended you bring the copy to be scanned in your chart.   The document will be available to all the doctors you see in the system.  Advance care planning is a process that supports adults in  understanding and sharing their preferences regarding future medical care.  The patient's preferences are recorded in documents called Advance Directives and the can be modified at any time while the patient is in full mental capacity.   If you don't have one, please consider create one.      More information at: [email or web address]    Per our records you are due for your diabetic eye exam. Please contact your eye doctor to schedule an appointment. Please have them send copies of your office visit notes to [place]. Our fax number is [phone]. If you need a referral to an eye doctor please let us know.

## 2023-01-03 NOTE — Progress Notes (Signed)
Subjective:    Patient ID: Mike Jimenez, male    DOB: 07/15/1951, 72 y.o.   MRN: [id number]  DOS:  01/03/2023 Type of visit - description: cpx  Here for CPX Doing well and has no major concerns.   Review of Systems     A 14 point review of systems is negative    Past Medical History:  Diagnosis Date   Anemia    Arthritis    "knees" (09/05/2014)   Automatic implantable cardioverter-defibrillator in situ    CAD (coronary artery disease)    a. Mild nonobstructive CAD by cath 08/2014.   CHF (congestive heart failure) ([place])    Childhood asthma    Gastric ulcer 2011   GERD (gastroesophageal reflux disease)    History of gout    Hyperlipidemia    Hypertension    LBBB (left bundle branch block)    NICM (nonischemic cardiomyopathy) ([place])    a. Dx 2011 - presented with sustained VT. Normal coronaries at that time, EF 10-15% by cath and 20-25% by echo. b. s/p biventricular ICD 2011. c. CP 08/2014: mild nonobstructive CAD.   Obesity    Postsurgical hypothyroidism    Thyroid cancer ([place])    a. 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 Dr Harlow Asa. s/p thyroidectomy 2013.   Torn meniscus    Type II diabetes mellitus ([place])    Ventricular tachycardia ([place])    a. Sustained VT 2011 s/p MDT [id number] Concert BiV ICD, ser# [id number].    Past Surgical History:  Procedure Laterality Date   BI-VENTRICULAR IMPLANTABLE CARDIOVERTER DEFIBRILLATOR  (CRT-D)  03/2010   BIOPSY  08/06/2021   Procedure: BIOPSY;  Surgeon: Irene Shipper, MD;  [facility];  Service: Endoscopy;;   CARDIAC CATHETERIZATION  03/2010   COLONOSCOPY WITH PROPOFOL N/A 08/06/2021   Procedure: COLONOSCOPY WITH PROPOFOL;  Surgeon: Irene Shipper, MD;  [facility];  Service: Endoscopy;  Laterality: N/A;   EP IMPLANTABLE DEVICE N/A 05/21/2016   Procedure:  ICD Generator Changeout;  Surgeon: Evans Lance, MD;  Location: [place] CV LAB;  Service: Cardiovascular;  Laterality: N/A;   LEFT HEART  CATHETERIZATION WITH CORONARY ANGIOGRAM N/A 09/06/2014   Procedure: LEFT HEART CATHETERIZATION WITH CORONARY ANGIOGRAM;  Surgeon: Burnell Blanks, MD;  Location: [facility] CATH LAB;  Service: Cardiovascular;  Laterality: N/A;   POLYPECTOMY  08/06/2021   Procedure: POLYPECTOMY;  Surgeon: Irene Shipper, MD;  [facility];  Service: Endoscopy;;   PTX     traumatic, age 36 months   THYROIDECTOMY  12/10/2011   Procedure: TOTAL THYROIDECTOMY WITH CENTRAL COMPARTMENT DISSECTION;  Surgeon: Earnstine Regal, MD;  Location: [place];  Service: General;  Laterality: N/A;  Total thyroidectomy with limited central compartment lymph node dissection.   VASECTOMY     Social History   Socioeconomic History   Marital status: Married    Spouse name: Not on file   Number of children: 2   Years of education: Not on file   Highest education level: Not on file  Occupational History   Occupation: [employer]   Occupation: to retire 2023  Tobacco Use   Smoking status: Former    Packs/day: 2.50    Years: 2.00    Total pack years: 5.00    Types: Cigarettes    Quit date: 11/29/1970    Years since quitting: 52.1   Smokeless tobacco: Former    Types: Nurse, children's Use: Never used  Substance  and Sexual Activity   Alcohol use: Yes    Comment: drinks rarely   Drug use: No   Sexual activity: Yes  Other Topics Concern   Not on file  Social History Narrative   Live w/ wife , daughter [name] lives in [place]   9 dogs, fish pond.   Job is  physical.   Does not routinely exercise.   Social Determinants of Health   Financial Resource Strain: Low Risk  (01/28/2022)   Overall Financial Resource Strain (CARDIA)    Difficulty of Paying Living Expenses: Not hard at all  Food Insecurity: No Food Insecurity (01/28/2022)   Hunger Vital Sign    Worried About Running Out of Food in the Last Year: Never true    Ran Out of Food in the Last Year: Never true  Transportation Needs: No Transportation Needs (01/28/2022)    PRAPARE - Hydrologist (Medical): No    Lack of Transportation (Non-Medical): No  Physical Activity: Sufficiently Active (01/28/2022)   Exercise Vital Sign    Days of Exercise per Week: 3 days    Minutes of Exercise per Session: 50 min  Stress: No Stress Concern Present (01/28/2022)   [place]    Feeling of Stress : Not at all  Social Connections: Moderately Isolated (01/28/2022)   Social Connection and Isolation Panel [NHANES]    Frequency of Communication with Friends and Family: Twice a week    Frequency of Social Gatherings with Friends and Family: Three times a week    Attends Religious Services: Never    Active Member of Clubs or Organizations: No    Attends Archivist Meetings: Never    Marital Status: Married  Human resources officer Violence: Not At Risk (01/28/2022)   Humiliation, Afraid, Rape, and Kick questionnaire    Fear of Current or Ex-Partner: No    Emotionally Abused: No    Physically Abused: No    Sexually Abused: No     Current Outpatient Medications  Medication Instructions   ACCU-CHEK GUIDE test strip TEST BLOOD SUGAR ONCE DAILY   acetaminophen (TYLENOL) 1,000 mg, Oral, Every 6 hours PRN   allopurinol (ZYLOPRIM) 100 mg, Oral, Daily   Apple Cider Vinegar 188 MG CAPS Oral, Daily   aspirin EC 81 mg, Oral, Daily   atorvastatin (LIPITOR) 40 mg, Oral, Daily   Blood Glucose Monitoring Suppl (ACCU-CHEK GUIDE) w/Device KIT TEST BLOOD SUGAR ONCE DAILY   carvedilol (COREG) 12.5 mg, Oral, Daily   empagliflozin (JARDIANCE) 10 mg, Oral, Daily before breakfast   furosemide (LASIX) 20 mg, Oral, Daily   levothyroxine (SYNTHROID) 224 mcg, Oral, Daily before breakfast   losartan (COZAAR) 25 mg, Oral, Daily   MAGNESIUM PO 1 tablet, Oral, Daily   metFORMIN (GLUCOPHAGE) 1,000 mg, Oral, 2 times daily with meals   Multiple Vitamin (MULTIVITAMIN WITH MINERALS) TABS tablet 1 tablet, Oral,  Daily   OVER THE COUNTER MEDICATION 1 application , Topical, Daily at bedtime, CBD Cream for neuropathy in feet   potassium chloride (KLOR-CON M10) 10 MEQ tablet 10 mEq, Oral, Daily   Semaglutide, 2 MG/DOSE, (OZEMPIC, 2 MG/DOSE,) 8 MG/3ML SOPN Subcutaneous   tadalafil (CIALIS) 20 mg, Oral, Daily PRN   vitamin E 400 Units, Daily       Objective:   Physical Exam BP 136/72   Pulse 77   Temp 98.1 F (36.7 C) (Oral)   Resp 16   Ht '6\' 2"'$  (1.88 m)  Wt 242 lb 8 oz (110 kg)   SpO2 98%   BMI 31.14 kg/m  General: Well developed, NAD, BMI noted Neck: No  thyromegaly  HEENT:  Normocephalic . Face symmetric, atraumatic Lungs:  CTA B Normal respiratory effort, no intercostal retractions, no accessory muscle use. Heart: RRR,  no murmur.  Abdomen:  Not distended, soft, non-tender. No rebound or rigidity.   Lower extremities: no pretibial edema bilaterally  Skin: Exposed areas without rash. Not pale. Not jaundice Neurologic:  alert & oriented X3.  Speech normal, gait appropriate for age and unassisted Strength symmetric and appropriate for age.  Psych: Cognition and judgment appear intact.  Cooperative with normal attention span and concentration.  Behavior appropriate. No anxious or depressed appearing.     Assessment     Assessment DM,  actos intolerant 01-2018, sees Dr Buddy Duty + Mild neuropathy on exam 03/2017 (used to be on gaba for muscle cramps) HTN Hyperlipidemia Hypothyroidism h/oThyroid cancer, nodularity, no mets, thyroidectomy 2013- sees Dr Buddy Duty yearly as off 05/2020 CV: --CHF/nonischemic cardiomyopathy dx 2011 --Ventricular tachycardia, 2011,  EF  ~15 -20 % -- s/p  biventricular ICD 2011--- replaced 04-2016 --CAD -Nonobstructive   by cath 08-2014 --LBBB  GI: --GERD --H/o anemia:  --Gastric ulcer per EGD 07-2010 DJD (hip pain, XRs dosne 2017) Gout H/o asthma as a child    PLAN Here for CPX. DM, thyroid disease: saw Endo last month. A1c was 8.0 December  2023. Neuropathy: Self stopped gabapentin, had a "shock wave therapy" on his feet and xs are much improved Cardiovascular Saw cardiology December 2023, felt to be stable. Gout: No episodes in years, check a uric acid Skin lesions: Multiple skin lesions noted at the forehead and face.  Patient tells me  has seen dermatology before.    Warning symptoms discussed with the patient. RTC 1 year

## 2023-01-03 NOTE — Assessment & Plan Note (Signed)
-  Td 11-2021 - PNM 23: 2011-2018--2021  - Prevnar 2015 - PNM 20: 05/2022 - RSV d/w pt  - zostavax 2016; s/p shingrix  -COVID VAX >> UTD - had a flu shot    CCS:  Colonoscopy 9- 2011 4 polyps, s/p Cscope 07-2021, next 7 years per path report -Prostate cancer screening:  DRE normal last year,  check PSA -Diet: Counseled.  Exercising more lately. -Labs: CMP, PSA, FLP - Healthcare POA: See AVS

## 2023-01-04 ENCOUNTER — Telehealth: Payer: Self-pay | Admitting: Internal Medicine

## 2023-01-04 MED ORDER — LOSARTAN POTASSIUM 50 MG PO TABS: 50.0000 mg | ORAL_TABLET | Freq: Every day | ORAL | 1 refills | Status: DC

## 2023-01-04 NOTE — Telephone Encounter (Signed)
Pt was returning [facility] call about increasing losartan. He just wanted to go over some questions. He already has '25mg'$  so he was just going take 2 of those for the time being. Please advise if that is okay.

## 2023-01-04 NOTE — Addendum Note (Signed)
Addended by: Damita Dunnings D on: 01/04/2023 11:44 AM   Modules accepted: Orders

## 2023-01-04 NOTE — Telephone Encounter (Signed)
LMOM informing Pt that he can take 2 of the losartan '25mg'$  until he runs out.

## 2023-01-06 LAB — HM DIABETES EYE EXAM

## 2023-01-14 ENCOUNTER — Other Ambulatory Visit (INDEPENDENT_AMBULATORY_CARE_PROVIDER_SITE_OTHER): Payer: PPO

## 2023-01-14 DIAGNOSIS — I10 Essential (primary) hypertension: Secondary | ICD-10-CM

## 2023-01-14 LAB — BASIC METABOLIC PANEL
BUN: 16 mg/dL (ref 6–23)
CO2: 32 mEq/L (ref 19–32)
Calcium: 9.1 mg/dL (ref 8.4–10.5)
Chloride: 100 mEq/L (ref 96–112)
Creatinine, Ser: 1.06 mg/dL (ref 0.40–1.50)
GFR: 70.88 mL/min (ref >60.00)
Glucose, Bld: 99 mg/dL (ref 70–99)
Potassium: 4.1 mEq/L (ref 3.5–5.1)
Sodium: 141 mEq/L (ref 135–145)

## 2023-01-17 ENCOUNTER — Other Ambulatory Visit: Payer: PPO

## 2023-01-24 ENCOUNTER — Ambulatory Visit: Payer: PPO | Attending: Internal Medicine

## 2023-01-24 DIAGNOSIS — I5022 Chronic systolic (congestive) heart failure: Secondary | ICD-10-CM | POA: Diagnosis not present

## 2023-01-24 DIAGNOSIS — Z95810 Presence of automatic (implantable) cardiac defibrillator: Secondary | ICD-10-CM

## 2023-01-26 NOTE — Progress Notes (Signed)
EPIC Encounter for ICM Monitoring  Patient Name: Mike Jimenez is a 72 y.o. male Date: 01/26/2023 Primary Care Physican: Colon Branch, MD Primary Cardiologist: Lovena Le Electrophysiologist: Santina Evans Pacing:  98.3% 02/17/2022 Weight: 250-255 lbs 08/11/2022 Weight:  245-250 lbs 11/08/2022 Office Weight: 245 lbs 01/26/2023 Weight: 239 lbs   Time in AT/AF    0.0 hr/day (0.0%)                                            Spoke with patient and heart failure questions reviewed.  Transmission results reviewed.  Pt asymptomatic for fluid accumulation.  Reports feeling well at this time and voices no complaints.   He is taking Ozempic for diabetes.     OptiVol Thoracic impedance suggesting normal fluid levels with exception of possible fluid accumulation from 2/5-2/12.     Prescribed:  Furosemide 20 mg 1 tablet (20 mg total) daily.   Potassium 10 mEq 1 tablet (10 mEq total) daily   Labs: 01/14/2023 Creatinine 01.06, BUN 16, Potassium 4.1, Sodium 141  01/03/2023 Creatinine 1.01, BUN 16, Potassium 4.1, Sodium 142  05/06/2022 Creatinine 1.17, BUN 20, Potassium 4.6, Sodium 145, GFR 67 12/31/2021 Creatinine 1.12, BUN 22, Potassium 4.3, Sodium 142  A complete set of results can be found in Results Review.   Recommendations: No changes and encouraged to call if experiencing any fluid symptoms.   Follow-up plan: ICM clinic phone appointment on 02/28/2023.   91 day device clinic remote transmission 03/23/2023.     EP/Cardiology Office Visits:  Recall 12/01/2023 with Oda Kilts, PA     Copy of ICM check sent to Dr. Lovena Le.      3 month ICM trend: 01/24/2023.    12-14 Month ICM trend:     Rosalene Billings, RN 01/26/2023 10:42 AM

## 2023-02-01 ENCOUNTER — Ambulatory Visit (INDEPENDENT_AMBULATORY_CARE_PROVIDER_SITE_OTHER): Payer: PPO | Admitting: *Deleted

## 2023-02-01 DIAGNOSIS — Z0000 Encounter for general adult medical examination without abnormal findings: Secondary | ICD-10-CM | POA: Diagnosis not present

## 2023-02-01 NOTE — Patient Instructions (Signed)
Mike Jimenez , Thank you for taking time to come for your Medicare Wellness Visit. I appreciate your ongoing commitment to your health goals. Please review the following plan we discussed and let me know if I can assist you in the future.     This is a list of the screening recommended for you and due dates:  Health Maintenance  Topic Date Due   Eye exam for diabetics  04/10/2021   COVID-19 Vaccine (6 - 2023-24 season) 07/04/2023*   Hemoglobin A1C  05/12/2023   Complete foot exam   11/11/2023   Yearly kidney health urinalysis for diabetes  01/04/2024   Yearly kidney function blood test for diabetes  01/15/2024   Medicare Annual Wellness Visit  02/01/2024   Colon Cancer Screening  08/06/2026   DTaP/Tdap/Td vaccine (4 - Td or Tdap) 12/10/2031   Pneumonia Vaccine  Completed   Flu Shot  Completed   Hepatitis C Screening: USPSTF Recommendation to screen - Ages 94-79 yo.  Completed   Zoster (Shingles) Vaccine  Completed   HPV Vaccine  Aged Out  *Topic was postponed. The date shown is not the original due date.    Next appointment: Follow up in one year for your annual wellness visit.   Preventive Care 60 Years and Older, Male Preventive care refers to lifestyle choices and visits with your health care provider that can promote health and wellness. What does preventive care include? A yearly physical exam. This is also called an annual well check. Dental exams once or twice a year. Routine eye exams. Ask your health care provider how often you should have your eyes checked. Personal lifestyle choices, including: Daily care of your teeth and gums. Regular physical activity. Eating a healthy diet. Avoiding tobacco and drug use. Limiting alcohol use. Practicing safe sex. Taking low doses of aspirin every day. Taking vitamin and mineral supplements as recommended by your health care provider. What happens during an annual well check? The services and screenings done by your health care  provider during your annual well check will depend on your age, overall health, lifestyle risk factors, and family history of disease. Counseling  Your health care provider may ask you questions about your: Alcohol use. Tobacco use. Drug use. Emotional well-being. Home and relationship well-being. Sexual activity. Eating habits. History of falls. Memory and ability to understand (cognition). Work and work Statistician. Screening  You may have the following tests or measurements: Height, weight, and BMI. Blood pressure. Lipid and cholesterol levels. These may be checked every 5 years, or more frequently if you are over 34 years old. Skin check. Lung cancer screening. You may have this screening every year starting at age 24 if you have a 30-pack-year history of smoking and currently smoke or have quit within the past 15 years. Fecal occult blood test (FOBT) of the stool. You may have this test every year starting at age 79. Flexible sigmoidoscopy or colonoscopy. You may have a sigmoidoscopy every 5 years or a colonoscopy every 10 years starting at age 33. Prostate cancer screening. Recommendations will vary depending on your family history and other risks. Hepatitis C blood test. Hepatitis B blood test. Sexually transmitted disease (STD) testing. Diabetes screening. This is done by checking your blood sugar (glucose) after you have not eaten for a while (fasting). You may have this done every 1-3 years. Abdominal aortic aneurysm (AAA) screening. You may need this if you are a current or former smoker. Osteoporosis. You may be screened starting at  age 59 if you are at high risk. Talk with your health care provider about your test results, treatment options, and if necessary, the need for more tests. Vaccines  Your health care provider may recommend certain vaccines, such as: Influenza vaccine. This is recommended every year. Tetanus, diphtheria, and acellular pertussis (Tdap, Td)  vaccine. You may need a Td booster every 10 years. Zoster vaccine. You may need this after age 30. Pneumococcal 13-valent conjugate (PCV13) vaccine. One dose is recommended after age 13. Pneumococcal polysaccharide (PPSV23) vaccine. One dose is recommended after age 65. Talk to your health care provider about which screenings and vaccines you need and how often you need them. This information is not intended to replace advice given to you by your health care provider. Make sure you discuss any questions you have with your health care provider. Document Released: 12/12/2015 Document Revised: 08/04/2016 Document Reviewed: 09/16/2015 Elsevier Interactive Patient Education  2017 [employer] Prevention in the Home Falls can cause injuries. They can happen to people of all ages. There are many things you can do to make your home safe and to help prevent falls. What can I do on the outside of my home? Regularly fix the edges of walkways and driveways and fix any cracks. Remove anything that might make you trip as you walk through a door, such as a raised step or threshold. Trim any bushes or trees on the path to your home. Use bright outdoor lighting. Clear any walking paths of anything that might make someone trip, such as rocks or tools. Regularly check to see if handrails are loose or broken. Make sure that both sides of any steps have handrails. Any raised decks and porches should have guardrails on the edges. Have any leaves, snow, or ice cleared regularly. Use sand or salt on walking paths during winter. Clean up any spills in your garage right away. This includes oil or grease spills. What can I do in the bathroom? Use night lights. Install grab bars by the toilet and in the tub and shower. Do not use towel bars as grab bars. Use non-skid mats or decals in the tub or shower. If you need to sit down in the shower, use a plastic, non-slip stool. Keep the floor dry. Clean up any  water that spills on the floor as soon as it happens. Remove soap buildup in the tub or shower regularly. Attach bath mats securely with double-sided non-slip rug tape. Do not have throw rugs and other things on the floor that can make you trip. What can I do in the bedroom? Use night lights. Make sure that you have a light by your bed that is easy to reach. Do not use any sheets or blankets that are too big for your bed. They should not hang down onto the floor. Have a firm chair that has side arms. You can use this for support while you get dressed. Do not have throw rugs and other things on the floor that can make you trip. What can I do in the kitchen? Clean up any spills right away. Avoid walking on wet floors. Keep items that you use a lot in easy-to-reach places. If you need to reach something above you, use a strong step stool that has a grab bar. Keep electrical cords out of the way. Do not use floor polish or wax that makes floors slippery. If you must use wax, use non-skid floor wax. Do not have throw rugs and  other things on the floor that can make you trip. What can I do with my stairs? Do not leave any items on the stairs. Make sure that there are handrails on both sides of the stairs and use them. Fix handrails that are broken or loose. Make sure that handrails are as long as the stairways. Check any carpeting to make sure that it is firmly attached to the stairs. Fix any carpet that is loose or worn. Avoid having throw rugs at the top or bottom of the stairs. If you do have throw rugs, attach them to the floor with carpet tape. Make sure that you have a light switch at the top of the stairs and the bottom of the stairs. If you do not have them, ask someone to add them for you. What else can I do to help prevent falls? Wear shoes that: Do not have high heels. Have rubber bottoms. Are comfortable and fit you well. Are closed at the toe. Do not wear sandals. If you use a  stepladder: Make sure that it is fully opened. Do not climb a closed stepladder. Make sure that both sides of the stepladder are locked into place. Ask someone to hold it for you, if possible. Clearly mark and make sure that you can see: Any grab bars or handrails. First and last steps. Where the edge of each step is. Use tools that help you move around (mobility aids) if they are needed. These include: Canes. Walkers. Scooters. Crutches. Turn on the lights when you go into a dark area. Replace any light bulbs as soon as they burn out. Set up your furniture so you have a clear path. Avoid moving your furniture around. If any of your floors are uneven, fix them. If there are any pets around you, be aware of where they are. Review your medicines with your doctor. Some medicines can make you feel dizzy. This can increase your chance of falling. Ask your doctor what other things that you can do to help prevent falls. This information is not intended to replace advice given to you by your health care provider. Make sure you discuss any questions you have with your health care provider. Document Released: 09/11/2009 Document Revised: 04/22/2016 Document Reviewed: 12/20/2014 Elsevier Interactive Patient Education  2017 [employer].

## 2023-02-01 NOTE — Progress Notes (Signed)
Subjective:   Mike Jimenez is a 72 y.o. male who presents for Medicare Annual/Subsequent preventive examination.  I connected with  Mike Jimenez on 02/01/23 by a audio enabled telemedicine application and verified that I am speaking with the correct person using two identifiers.  Patient Location: Home  Provider Location: Office/Clinic  I discussed the limitations of evaluation and management by telemedicine. The patient expressed understanding and agreed to proceed.   Review of Systems     Cardiac Risk Factors include: advanced age (>11mn, >>65women);diabetes mellitus;dyslipidemia;hypertension     Objective:    There were no vitals filed for this visit. There is no height or weight on file to calculate BMI.     02/01/2023    2:25 PM 01/28/2022    2:24 PM 08/06/2021    7:09 AM 05/15/2021    2:46 PM 01/22/2021    2:53 PM 01/18/2020    1:04 PM 01/01/2019    9:21 AM  Advanced Directives  Does Patient Have a Medical Advance Directive? Yes Yes No No No No No  Type of Advance Directive Healthcare Power of [place]? No - copy requested No - copy requested       Would patient like information on creating a medical advance directive?    No - Patient declined No - Patient declined No - Patient declined Yes (MAU/Ambulatory/Procedural Areas - Information given)    Current Medications (verified) Outpatient Encounter Medications as of 02/01/2023  Medication Sig   ACCU-CHEK GUIDE test strip TEST BLOOD SUGAR ONCE DAILY   acetaminophen (TYLENOL) 500 MG tablet Take 1,000 mg by mouth every 6 (six) hours as needed (pain).   allopurinol (ZYLOPRIM) 100 MG tablet Take 1 tablet (100 mg total) by mouth daily.   Apple Cider Vinegar 188 MG CAPS Take by mouth daily.   aspirin EC 81 MG tablet Take 1 tablet (81 mg total) by mouth daily.   atorvastatin (LIPITOR) 40 MG tablet Take 1 tablet (40 mg total) by mouth daily.   Blood  Glucose Monitoring Suppl (ACCU-CHEK GUIDE) w/Device KIT TEST BLOOD SUGAR ONCE DAILY   carvedilol (COREG) 12.5 MG tablet Take 1 tablet (12.5 mg total) by mouth daily.   empagliflozin (JARDIANCE) 10 MG TABS tablet Take 1 tablet (10 mg total) by mouth daily before breakfast.   furosemide (LASIX) 20 MG tablet Take 1 tablet (20 mg total) by mouth daily.   levothyroxine (SYNTHROID) 112 MCG tablet Take 2 tablets (224 mcg total) by mouth daily before breakfast.   losartan (COZAAR) 50 MG tablet Take 1 tablet (50 mg total) by mouth daily.   MAGNESIUM PO Take 1 tablet by mouth daily.   metFORMIN (GLUCOPHAGE) 1000 MG tablet Take 1 tablet (1,000 mg total) by mouth 2 (two) times daily with a meal.   Multiple Vitamin (MULTIVITAMIN WITH MINERALS) TABS tablet Take 1 tablet by mouth daily.   OVER THE COUNTER MEDICATION Apply 1 application topically at bedtime. CBD Cream for neuropathy in feet   potassium chloride (KLOR-CON M10) 10 MEQ tablet Take 1 tablet (10 mEq total) by mouth daily.   Semaglutide, 2 MG/DOSE, (OZEMPIC, 2 MG/DOSE,) 8 MG/3ML SOPN Inject into the skin.   tadalafil (CIALIS) 20 MG tablet Take 1 tablet (20 mg total) by mouth daily as needed for erectile dysfunction.   vitamin E 400 UNIT capsule Take 400 Units by mouth daily.   No facility-administered encounter  medications on file as of 02/01/2023.    Allergies (verified) Bee venom and Ramipril   History: Past Medical History:  Diagnosis Date   Anemia    Arthritis    "knees" (09/05/2014)   Automatic implantable cardioverter-defibrillator in situ    CAD (coronary artery disease)    a. Mild nonobstructive CAD by cath 08/2014.   CHF (congestive heart failure) ([place])    Childhood asthma    Gastric ulcer 2011   GERD (gastroesophageal reflux disease)    History of gout    Hyperlipidemia    Hypertension    LBBB (left bundle branch block)    NICM (nonischemic cardiomyopathy) ([place])    a. Dx 2011 - presented with sustained VT. Normal coronaries  at that time, EF 10-15% by cath and 20-25% by echo. b. s/p biventricular ICD 2011. c. CP 08/2014: mild nonobstructive CAD.   Obesity    Postsurgical hypothyroidism    Thyroid cancer ([place])    a. 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 Dr Harlow Asa. s/p thyroidectomy 2013.   Torn meniscus    Type II diabetes mellitus ([place])    Ventricular tachycardia ([place])    a. Sustained VT 2011 s/p MDT [id number] Concert BiV ICD, ser# [id number].   Past Surgical History:  Procedure Laterality Date   BI-VENTRICULAR IMPLANTABLE CARDIOVERTER DEFIBRILLATOR  (CRT-D)  03/2010   BIOPSY  08/06/2021   Procedure: BIOPSY;  Surgeon: Irene Shipper, MD;  Location: [facility];  Service: Endoscopy;;   CARDIAC CATHETERIZATION  03/2010   COLONOSCOPY WITH PROPOFOL N/A 08/06/2021   Procedure: COLONOSCOPY WITH PROPOFOL;  Surgeon: Irene Shipper, MD;  Location: [facility];  Service: Endoscopy;  Laterality: N/A;   EP IMPLANTABLE DEVICE N/A 05/21/2016   Procedure:  ICD Generator Changeout;  Surgeon: Evans Lance, MD;  Location: [facility];  Service: Cardiovascular;  Laterality: N/A;   LEFT HEART CATHETERIZATION WITH CORONARY ANGIOGRAM N/A 09/06/2014   Procedure: LEFT HEART CATHETERIZATION WITH CORONARY ANGIOGRAM;  Surgeon: Burnell Blanks, MD;  Location: [facility] CATH LAB;  Service: Cardiovascular;  Laterality: N/A;   POLYPECTOMY  08/06/2021   Procedure: POLYPECTOMY;  Surgeon: Irene Shipper, MD;  Location: [facility];  Service: Endoscopy;;   PTX     traumatic, age 62 months   THYROIDECTOMY  12/10/2011   Procedure: TOTAL THYROIDECTOMY WITH CENTRAL COMPARTMENT DISSECTION;  Surgeon: Earnstine Regal, MD;  Location: [facility];  Service: General;  Laterality: N/A;  Total thyroidectomy with limited central compartment lymph node dissection.   VASECTOMY     Family History  Problem Relation Age of Onset   Atrial fibrillation Father        diseased at ag 68   Hypertension Father        alive @ 51.   Hypothyroidism Mother     Hypertension Mother        alive @ 53.   Diabetes Maternal Grandfather    Diabetes Maternal Aunt    Colon cancer Maternal Aunt    Colon cancer Maternal Aunt    Lung cancer Paternal Uncle    Lung cancer Maternal Uncle    Asthma Sister    Hypothyroidism Daughter    Prostate cancer Neg Hx    CAD Neg Hx    Social History   Socioeconomic History   Marital status: Married    Spouse name: Not on file   Number of children: 2   Years of education: Not on file   Highest education level: Not on file  Occupational History   Occupation: [employer]   Occupation: to retire 2023  Tobacco Use   Smoking status: Former    Packs/day: 2.50    Years: 2.00    Total pack years: 5.00    Types: Cigarettes    Quit date: 11/29/1970    Years since quitting: 52.2   Smokeless tobacco: Former    Types: Nurse, children's Use: Never used  Substance and Sexual Activity   Alcohol use: Yes    Comment: drinks rarely   Drug use: No   Sexual activity: Yes  Other Topics Concern   Not on file  Social History Narrative   Live w/ wife , daughter [name] lives in [place]   9 dogs, fish pond.   Job is  physical.   Does not routinely exercise.   Social Determinants of Health   Financial Resource Strain: Low Risk  (01/28/2022)   Overall Financial Resource Strain (CARDIA)    Difficulty of Paying Living Expenses: Not hard at all  Food Insecurity: No Food Insecurity (02/01/2023)   Hunger Vital Sign    Worried About Running Out of Food in the Last Year: Never true    Ran Out of Food in the Last Year: Never true  Transportation Needs: No Transportation Needs (02/01/2023)   PRAPARE - Hydrologist (Medical): No    Lack of Transportation (Non-Medical): No  Physical Activity: Sufficiently Active (02/01/2023)   Exercise Vital Sign    Days of Exercise per Week: 3 days    Minutes of Exercise per Session: 50 min  Stress: No Stress Concern Present (01/28/2022)   [place]    Feeling of Stress : Not at all  Social Connections: Moderately Isolated (01/28/2022)   Social Connection and Isolation Panel [NHANES]    Frequency of Communication with Friends and Family: Twice a week    Frequency of Social Gatherings with Friends and Family: Three times a week    Attends Religious Services: Never    Active Member of Clubs or Organizations: No    Attends Archivist Meetings: Never    Marital Status: Married    Tobacco Counseling Counseling given: Not Answered   Clinical Intake:  Pre-visit preparation completed: Yes  Pain : No/denies pain  Nutritional Risks: None Diabetes: Yes CBG done?: No Did pt. bring in CBG monitor from home?: No  How often do you need to have someone help you when you read instructions, pamphlets, or other written materials from your doctor or pharmacy?: 1 - Never   Activities of Daily Living    02/01/2023    2:29 PM  In your present state of health, do you have any difficulty performing the following activities:  Hearing? 0  Vision? 0  Difficulty concentrating or making decisions? 0  Walking or climbing stairs? 0  Dressing or bathing? 0  Doing errands, shopping? 0  Preparing Food and eating ? N  Using the Toilet? N  In the past six months, have you accidently leaked urine? N  Do you have problems with loss of bowel control? N  Managing your Medications? N  Managing your Finances? N  Housekeeping or managing your Housekeeping? N    Patient Care Team: Colon Branch, MD as PCP - General (Internal Medicine) Evans Lance, MD as PCP - Electrophysiology (Cardiology) Delrae Rend, MD as Consulting Physician (Endocrinology) Thalia Bloodgood, [place] as Referring Physician (Optometry) Scarlette Shorts  N, MD as Consulting Physician (Gastroenterology)  Indicate any recent Medical Services you may have received from other than [facility] providers in the past year (date may be approximate).      Assessment:   This is a routine wellness examination for Kelen.  Hearing/Vision screen No results found.  Dietary issues and exercise activities discussed: Current Exercise Habits: Home exercise routine, Type of exercise: calisthenics;strength training/weights;Other - see comments (elliptical, water aerobics), Time (Minutes): 35, Frequency (Times/Week): 4, Weekly Exercise (Minutes/Week): 140, Intensity: Moderate, Exercise limited by: None identified   Goals Addressed   None    Depression Screen    02/01/2023    2:28 PM 01/03/2023    8:15 AM 01/28/2022    2:26 PM 12/31/2021    7:58 AM 01/22/2021    2:56 PM 12/30/2020    8:59 AM 01/18/2020    1:07 PM  PHQ 2/9 Scores  PHQ - 2 Score 0 0 0 0 0 0 0    Fall Risk    02/01/2023    2:26 PM 01/03/2023    8:15 AM 01/28/2022    2:25 PM 12/31/2021    7:57 AM 01/22/2021    2:55 PM  [place] in the past year? 0 0 0 0 1  Number falls in past yr: 0 0 0 0 0  Injury with Fall? 0 0 0 0 0  Risk for fall due to : No Fall Risks      Follow up Falls evaluation completed Falls evaluation completed Falls prevention discussed Falls evaluation completed Falls prevention discussed    FALL RISK PREVENTION PERTAINING TO THE HOME:  Any stairs in or around the home? Yes  If so, are there any without handrails? No  Home free of loose throw rugs in walkways, pet beds, electrical cords, etc? Yes  Adequate lighting in your home to reduce risk of falls? Yes   ASSISTIVE DEVICES UTILIZED TO PREVENT FALLS:  Life alert? No  Use of a cane, walker or w/c? No  Grab bars in the bathroom? No  Shower chair or bench in shower? No  Elevated toilet seat or a handicapped toilet? Yes   TIMED UP AND GO:  Was the test performed?  No, audio visit .   Cognitive Function:    01/01/2019    9:32 AM  MMSE - Mini Mental State Exam  Orientation to time 5  Orientation to Place 5  Registration 3  Attention/ Calculation 5  Recall 3  Language- name 2 objects 2  Language-  repeat 1  Language- follow 3 step command 3  Language- read & follow direction 1  Write a sentence 1  Copy design 1  Total score 30        02/01/2023    2:33 PM  6CIT Screen  What Year? 0 points  What month? 0 points  What time? 0 points  Count back from 20 0 points  Months in reverse 0 points  Repeat phrase 0 points  Total Score 0 points    Immunizations Immunization History  Administered Date(s) Administered   COVID-19, mRNA, vaccine(Comirnaty)12 years and older 09/12/2022   Influenza Split 09/08/2011, 07/20/2018, 08/28/2021   Influenza Whole 08/17/2010   Influenza, High Dose Seasonal PF 07/11/2017, 07/16/2019, 08/01/2020, 08/27/2022   Influenza,inj,Quad PF,6+ Mos 08/29/2013   Influenza-Unspecified 07/30/2014, 07/31/2015, 07/30/2016   PFIZER(Purple Top)SARS-COV-2 Vaccination 02/10/2020, 03/01/2020, 09/13/2020   PNEUMOCOCCAL CONJUGATE-20 06/04/2022   PPD Test 09/17/2011   Pfizer Covid-19 Vaccine Bivalent Booster 67yr & up  08/28/2021   Pneumococcal Conjugate-13 11/20/2014   Pneumococcal Polysaccharide-23 04/23/2010, 04/11/2017, 06/23/2020   Tdap 01/23/2014, 06/23/2019, 12/09/2021   Zoster Recombinat (Shingrix) 11/20/2018, 01/21/2019   Zoster, Live 11/28/2015    TDAP status: Up to date  Flu Vaccine status: Up to date  Pneumococcal vaccine status: Up to date  Covid-19 vaccine status: Information provided on how to obtain vaccines.   Qualifies for Shingles Vaccine? Yes   Zostavax completed Yes   Shingrix Completed?: Yes  Screening Tests Health Maintenance  Topic Date Due   OPHTHALMOLOGY EXAM  04/10/2021   Medicare Annual Wellness (AWV)  01/29/2023   COVID-19 Vaccine (6 - 2023-24 season) 07/04/2023 (Originally 11/07/2022)   HEMOGLOBIN A1C  05/12/2023   FOOT EXAM  11/11/2023   Diabetic kidney evaluation - Urine ACR  01/04/2024   Diabetic kidney evaluation - eGFR measurement  01/15/2024   COLONOSCOPY (Pts 45-75yr Insurance coverage will need to be confirmed)   08/06/2026   DTaP/Tdap/Td (4 - Td or Tdap) 12/10/2031   Pneumonia Vaccine 71 Years old  Completed   INFLUENZA VACCINE  Completed   Hepatitis C Screening  Completed   Zoster Vaccines- Shingrix  Completed   HPV VACCINES  Aged Out    Health Maintenance  Health Maintenance Due  Topic Date Due   OPHTHALMOLOGY EXAM  04/10/2021   Medicare Annual Wellness (AWV)  01/29/2023    Colorectal cancer screening: Type of screening: Colonoscopy. Completed 08/06/21. Repeat every 5 years  Lung Cancer Screening: (Low Dose CT Chest recommended if Age 71-80years, 30 pack-year currently smoking OR have quit w/in 15years.) does not qualify.   Additional Screening:  Hepatitis C Screening: does qualify; Completed 11/05/16  Vision Screening: Recommended annual ophthalmology exams for early detection of glaucoma and other disorders of the eye. Is the patient up to date with their annual eye exam?  Yes  Who is the provider or what is the name of the office in which the patient attends annual eye exams? [facility] If pt is not established with a provider, would they like to be referred to a provider to establish care? No .   Dental Screening: Recommended annual dental exams for proper oral hygiene  Community Resource Referral / Chronic Care Management: CRR required this visit?  No   CCM required this visit?  No      Plan:     I have personally reviewed and noted the following in the patient's chart:   Medical and social history Use of alcohol, tobacco or illicit drugs  Current medications and supplements including opioid prescriptions. Patient is not currently taking opioid prescriptions. Functional ability and status Nutritional status Physical activity Advanced directives List of other physicians Hospitalizations, surgeries, and ER visits in previous 12 months Vitals Screenings to include cognitive, depression, and falls Referrals and appointments  In addition, I have reviewed  and discussed with patient certain preventive protocols, quality metrics, and best practice recommendations. A written personalized care plan for preventive services as well as general preventive health recommendations were provided to patient.   Due to this being a telephonic visit, the after visit summary with patients personalized plan was offered to patient via mail or my-chart. Patient would like to access on my-chart.  BBeatris Ship [place]  02/01/2023   Nurse Notes: None

## 2023-02-08 DIAGNOSIS — Z85850 Personal history of malignant neoplasm of thyroid: Secondary | ICD-10-CM | POA: Diagnosis not present

## 2023-02-08 DIAGNOSIS — E890 Postprocedural hypothyroidism: Secondary | ICD-10-CM | POA: Diagnosis not present

## 2023-02-08 DIAGNOSIS — E1165 Type 2 diabetes mellitus with hyperglycemia: Secondary | ICD-10-CM | POA: Diagnosis not present

## 2023-02-28 ENCOUNTER — Ambulatory Visit: Payer: PPO | Attending: Internal Medicine

## 2023-02-28 DIAGNOSIS — I5022 Chronic systolic (congestive) heart failure: Secondary | ICD-10-CM

## 2023-02-28 DIAGNOSIS — Z95810 Presence of automatic (implantable) cardiac defibrillator: Secondary | ICD-10-CM

## 2023-03-04 ENCOUNTER — Telehealth: Payer: Self-pay

## 2023-03-04 NOTE — Progress Notes (Signed)
EPIC Encounter for ICM Monitoring  Patient Name: Mike Jimenez is a 72 y.o. male Date: 03/04/2023 Primary Care Physican: Wanda Plump, MD Primary Cardiologist: Ladona Ridgel Electrophysiologist: Rae Roam Pacing:  98.3% 02/17/2022 Weight: 250-255 lbs 08/11/2022 Weight:  245-250 lbs 11/08/2022 Office Weight: 245 lbs 01/26/2023 Weight: 239 lbs   Time in AT/AF    0.0 hr/day (0.0%)                                            Attempted call to patient and unable to reach.  Left detailed message per DPR regarding transmission. Transmission reviewed.    OptiVol Thoracic impedance suggesting normal fluid levels.     Prescribed:  Furosemide 20 mg 1 tablet (20 mg total) daily.   Potassium 10 mEq 1 tablet (10 mEq total) daily   Labs: 01/14/2023 Creatinine 01.06, BUN 16, Potassium 4.1, Sodium 141  01/03/2023 Creatinine 1.01, BUN 16, Potassium 4.1, Sodium 142  05/06/2022 Creatinine 1.17, BUN 20, Potassium 4.6, Sodium 145, GFR 67 12/31/2021 Creatinine 1.12, BUN 22, Potassium 4.3, Sodium 142  A complete set of results can be found in Results Review.   Recommendations: Left voice mail with ICM number and encouraged to call if experiencing any fluid symptoms.   Follow-up plan: ICM clinic phone appointment on 04/04/2023.   91 day device clinic remote transmission 03/23/2023.     EP/Cardiology Office Visits:  Recall 12/01/2023 with Otilio Saber, PA     Copy of ICM check sent to Dr. Ladona Ridgel.      3 month ICM trend: 02/28/2023.    12-14 Month ICM trend:     Karie Soda, RN 03/04/2023 4:19 PM

## 2023-03-04 NOTE — Telephone Encounter (Signed)
Remote ICM transmission received.  Attempted call to patient regarding ICM remote transmission and left detailed message per DPR.  Advised to return call for any fluid symptoms or questions. Next ICM remote transmission scheduled 04/04/2023.    

## 2023-03-07 ENCOUNTER — Encounter: Payer: Self-pay | Admitting: Internal Medicine

## 2023-03-23 ENCOUNTER — Ambulatory Visit (INDEPENDENT_AMBULATORY_CARE_PROVIDER_SITE_OTHER): Payer: PPO

## 2023-03-23 DIAGNOSIS — I428 Other cardiomyopathies: Secondary | ICD-10-CM

## 2023-03-23 LAB — CUP PACEART REMOTE DEVICE CHECK
Battery Remaining Longevity: 14 mo
Battery Voltage: 2.89 V
Brady Statistic AP VP Percent: 0.05 %
Brady Statistic AP VS Percent: 0.01 %
Brady Statistic AS VP Percent: 98.54 %
Brady Statistic AS VS Percent: 1.39 %
Brady Statistic RA Percent Paced: 0.06 %
Brady Statistic RV Percent Paced: 97.48 %
Date Time Interrogation Session: 20240424001804
HighPow Impedance: 46 Ohm
HighPow Impedance: 56 Ohm
Implantable Lead Connection Status: 753985
Implantable Lead Connection Status: 753985
Implantable Lead Connection Status: 753985
Implantable Lead Implant Date: 20110516
Implantable Lead Implant Date: 20110516
Implantable Lead Implant Date: 20110516
Implantable Lead Location: 753858
Implantable Lead Location: 753859
Implantable Lead Location: 753860
Implantable Lead Model: 5076
Implantable Lead Model: 6947
Implantable Pulse Generator Implant Date: 20170623
Lead Channel Impedance Value: 285 Ohm
Lead Channel Impedance Value: 323 Ohm
Lead Channel Impedance Value: 399 Ohm
Lead Channel Impedance Value: 399 Ohm
Lead Channel Impedance Value: 551 Ohm
Lead Channel Impedance Value: 836 Ohm
Lead Channel Pacing Threshold Amplitude: 0.75 V
Lead Channel Pacing Threshold Amplitude: 0.875 V
Lead Channel Pacing Threshold Amplitude: 1.125 V
Lead Channel Pacing Threshold Pulse Width: 0.4 ms
Lead Channel Pacing Threshold Pulse Width: 0.4 ms
Lead Channel Pacing Threshold Pulse Width: 0.6 ms
Lead Channel Sensing Intrinsic Amplitude: 2.625 mV
Lead Channel Sensing Intrinsic Amplitude: 2.625 mV
Lead Channel Sensing Intrinsic Amplitude: 7.875 mV
Lead Channel Sensing Intrinsic Amplitude: 7.875 mV
Lead Channel Setting Pacing Amplitude: 1.75 V
Lead Channel Setting Pacing Amplitude: 2 V
Lead Channel Setting Pacing Amplitude: 2.5 V
Lead Channel Setting Pacing Pulse Width: 0.4 ms
Lead Channel Setting Pacing Pulse Width: 0.6 ms
Lead Channel Setting Sensing Sensitivity: 0.3 mV
Zone Setting Status: 755011

## 2023-04-04 ENCOUNTER — Ambulatory Visit (INDEPENDENT_AMBULATORY_CARE_PROVIDER_SITE_OTHER): Payer: PPO

## 2023-04-04 DIAGNOSIS — I5022 Chronic systolic (congestive) heart failure: Secondary | ICD-10-CM

## 2023-04-04 DIAGNOSIS — Z95810 Presence of automatic (implantable) cardiac defibrillator: Secondary | ICD-10-CM

## 2023-04-08 NOTE — Progress Notes (Signed)
EPIC Encounter for ICM Monitoring  Patient Name: Mike Jimenez is a 72 y.o. male Date: 04/08/2023 Primary Care Physican: Wanda Plump, MD Primary Cardiologist: Ladona Ridgel Electrophysiologist: Rae Roam Pacing:  98.3% 02/17/2022 Weight: 250-255 lbs 08/11/2022 Weight:  245-250 lbs 11/08/2022 Office Weight: 245 lbs 01/26/2023 Weight: 239 lbs   Time in AT/AF    0.0 hr/day (0.0%)                                            Transmission reviewed and results sent via mychart.    OptiVol Thoracic impedance suggesting normal fluid levels.     Prescribed:  Furosemide 20 mg 1 tablet (20 mg total) daily.   Potassium 10 mEq 1 tablet (10 mEq total) daily   Labs: 01/14/2023 Creatinine 01.06, BUN 16, Potassium 4.1, Sodium 141  01/03/2023 Creatinine 1.01, BUN 16, Potassium 4.1, Sodium 142  05/06/2022 Creatinine 1.17, BUN 20, Potassium 4.6, Sodium 145, GFR 67 12/31/2021 Creatinine 1.12, BUN 22, Potassium 4.3, Sodium 142  A complete set of results can be found in Results Review.   Recommendations:  No changes   Follow-up plan: ICM clinic phone appointment on 05/16/2023.   91 day device clinic remote transmission 06/22/2023.     EP/Cardiology Office Visits:  Recall 12/01/2023 with Otilio Saber, PA     Copy of ICM check sent to Dr. Ladona Ridgel.      3 month ICM trend: 04/07/2023.    12-14 Month ICM trend:     Karie Soda, RN 04/08/2023 5:00 PM

## 2023-04-19 NOTE — Progress Notes (Signed)
Remote ICD transmission.   

## 2023-05-09 ENCOUNTER — Ambulatory Visit (INDEPENDENT_AMBULATORY_CARE_PROVIDER_SITE_OTHER): Payer: PPO | Admitting: Internal Medicine

## 2023-05-09 ENCOUNTER — Encounter: Payer: Self-pay | Admitting: Internal Medicine

## 2023-05-09 VITALS — BP 154/92 | HR 98 | Temp 100.3°F | Resp 18 | Ht 74.0 in | Wt 237.5 lb

## 2023-05-09 DIAGNOSIS — Z09 Encounter for follow-up examination after completed treatment for conditions other than malignant neoplasm: Secondary | ICD-10-CM

## 2023-05-09 DIAGNOSIS — E0829 Diabetes mellitus due to underlying condition with other diabetic kidney complication: Secondary | ICD-10-CM | POA: Diagnosis not present

## 2023-05-09 DIAGNOSIS — R809 Proteinuria, unspecified: Secondary | ICD-10-CM

## 2023-05-09 DIAGNOSIS — J020 Streptococcal pharyngitis: Secondary | ICD-10-CM

## 2023-05-09 LAB — POC COVID19 BINAXNOW: SARS Coronavirus 2 Ag: NEGATIVE

## 2023-05-09 LAB — POCT RAPID STREP A (OFFICE): Rapid Strep A Screen: POSITIVE — AB

## 2023-05-09 MED ORDER — PREDNISONE 10 MG PO TABS: ORAL_TABLET | ORAL | 0 refills | Status: DC

## 2023-05-09 MED ORDER — AMOXICILLIN 875 MG PO TABS: 875.0000 mg | ORAL_TABLET | Freq: Two times a day (BID) | ORAL | 0 refills | Status: DC

## 2023-05-09 MED ORDER — HYDROCODONE BIT-HOMATROP MBR 5-1.5 MG/5ML PO SOLN: 5.0000 mL | Freq: Every evening | ORAL | 0 refills | Status: DC | PRN

## 2023-05-09 NOTE — Assessment & Plan Note (Signed)
Strep pharyngitis The patient got sick 2 days ago, + fever, main symptom is sore throat, physical exam consistent with pharyngitis, strep test positive. COVID test negative. O2 sat is 92% at rest. On chart review his O2 sat has ranged from 93% to 98% Plan: Amoxicillin, low-dose prednisone due to mild uvula swelling, hydrocodone for cough suppression, Tylenol for fever, check O2 sat at home.  Call if not gradually better DM, microalbuminuria: See LOV, microalbumin present, losartan dose increased to 50 mg, follow-up BMP okay

## 2023-05-09 NOTE — Patient Instructions (Signed)
  Rest, fluids , tylenol  Take the antibiotic as prescribed and prednisone for few days  For cough:  Take Mucinex DM or Robitussin-DM OTC.  Follow the instructions in the box. At nighttime, take hydrocodone syrup 1 teaspoon as needed.   For nasal congestion: -Use over-the-counter Flonase: 2 nasal sprays on each side of the nose in the morning until you feel better  Avoid decongestants such as  Pseudoephedrine or phenylephrine   Check your O2 sat: As long as he is not dropping below 92% consistently is okay.  Should be back to 96 to 98% in few days.  Call if not gradually better over the next  10 days   Call anytime if the symptoms are severe, you have high fever, short of breath, chest pain

## 2023-05-09 NOTE — Progress Notes (Signed)
Subjective:    Patient ID: Mike Jimenez, male    DOB: 1951/09/21, 72 y.o.   MRN: [id number]  DOS:  05/09/2023 Type of visit - description: acute    Symptoms started 2 days ago: Chest congestion, cough making it difficult to sleep. Also sore throat. I asked the patient what symptoms bother him the most and he said  sore throat.  Ran a low-grade fever 2 days ago. No chest pain or difficulty breathing No nausea or vomiting. No unusual aches or pains. + Postnasal dripping.   Review of Systems See above   Past Medical History:  Diagnosis Date   Anemia    Arthritis    "knees" (09/05/2014)   Automatic implantable cardioverter-defibrillator in situ    CAD (coronary artery disease)    a. Mild nonobstructive CAD by cath 08/2014.   CHF (congestive heart failure) (HCC)    Childhood asthma    Gastric ulcer 2011   GERD (gastroesophageal reflux disease)    History of gout    Hyperlipidemia    Hypertension    LBBB (left bundle branch block)    NICM (nonischemic cardiomyopathy) (HCC)    a. Dx 2011 - presented with sustained VT. Normal coronaries at that time, EF 10-15% by cath and 20-25% by echo. b. s/p biventricular ICD 2011. c. CP 08/2014: mild nonobstructive CAD.   Obesity    Postsurgical hypothyroidism    Thyroid cancer (HCC)    a. 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 Dr Gerrit Friends. s/p thyroidectomy 2013.   Torn meniscus    Type II diabetes mellitus (HCC)    Ventricular tachycardia (HCC)    a. Sustained VT 2011 s/p MDT [id number] Concert BiV ICD, ser# [id number].    Past Surgical History:  Procedure Laterality Date   BI-VENTRICULAR IMPLANTABLE CARDIOVERTER DEFIBRILLATOR  (CRT-D)  03/2010   BIOPSY  08/06/2021   Procedure: BIOPSY;  Surgeon: Hilarie Fredrickson, MD;  Location: [facility];  Service: Endoscopy;;   CARDIAC CATHETERIZATION  03/2010   COLONOSCOPY WITH PROPOFOL N/A 08/06/2021   Procedure: COLONOSCOPY WITH PROPOFOL;  Surgeon: Hilarie Fredrickson, MD;  Location: [facility]  ENDOSCOPY;  Service: Endoscopy;  Laterality: N/A;   EP IMPLANTABLE DEVICE N/A 05/21/2016   Procedure:  ICD Generator Changeout;  Surgeon: Marinus Maw, MD;  Location: [facility] INVASIVE CV LAB;  Service: Cardiovascular;  Laterality: N/A;   LEFT HEART CATHETERIZATION WITH CORONARY ANGIOGRAM N/A 09/06/2014   Procedure: LEFT HEART CATHETERIZATION WITH CORONARY ANGIOGRAM;  Surgeon: Kathleene Hazel, MD;  Location: [facility] CATH LAB;  Service: Cardiovascular;  Laterality: N/A;   POLYPECTOMY  08/06/2021   Procedure: POLYPECTOMY;  Surgeon: Hilarie Fredrickson, MD;  Location: [facility];  Service: Endoscopy;;   PTX     traumatic, age 57 months   THYROIDECTOMY  12/10/2011   Procedure: TOTAL THYROIDECTOMY WITH CENTRAL COMPARTMENT DISSECTION;  Surgeon: Velora Heckler, MD;  Location[facility] OR;  Service: General;  Laterality: N/A;  Total thyroidectomy with limited central compartment lymph node dissection.   VASECTOMY      Current Outpatient Medications  Medication Instructions   ACCU-CHEK GUIDE test strip TEST BLOOD SUGAR ONCE DAILY   acetaminophen (TYLENOL) 1,000 mg, Oral, Every 6 hours PRN   allopurinol (ZYLOPRIM) 100 mg, Oral, Daily   Apple Cider Vinegar 188 MG CAPS Oral, Daily   aspirin EC 81 mg, Oral, Daily   atorvastatin (LIPITOR) 40 mg, Oral, Daily   Blood Glucose Monitoring Suppl (ACCU-CHEK GUIDE) w/Device KIT TEST BLOOD SUGAR ONCE DAILY  carvedilol (COREG) 12.5 mg, Oral, Daily   empagliflozin (JARDIANCE) 10 mg, Oral, Daily before breakfast   furosemide (LASIX) 20 mg, Oral, Daily   levothyroxine (SYNTHROID) 224 mcg, Oral, Daily before breakfast   losartan (COZAAR) 50 mg, Oral, Daily   MAGNESIUM PO 1 tablet, Oral, Daily   metFORMIN (GLUCOPHAGE) 1,000 mg, Oral, 2 times daily with meals   Multiple Vitamin (MULTIVITAMIN WITH MINERALS) TABS tablet 1 tablet, Oral, Daily   OVER THE COUNTER MEDICATION 1 application , Topical, Daily at bedtime, CBD Cream for neuropathy in feet   potassium chloride (KLOR-CON M10)  10 MEQ tablet 10 mEq, Oral, Daily   Semaglutide, 2 MG/DOSE, (OZEMPIC, 2 MG/DOSE,) 8 MG/3ML SOPN Subcutaneous   tadalafil (CIALIS) 20 mg, Oral, Daily PRN   vitamin E 400 Units, Daily       Objective:   Physical Exam BP (!) 154/92   Pulse 98   Temp 100.3 F (37.9 C) (Oral)   Resp 18   Ht 6\' 2"  (1.88 m)   Wt 237 lb 8 oz (107.7 kg)   SpO2 92%   BMI 30.49 kg/m  General:   Well developed, NAD, BMI noted. HEENT:  Normocephalic . Face symmetric, atraumatic. Throat: Red, no white patches.  Uvula midline w/ minimal swelling.  No trismus.  Throat symmetric. Lungs:  CTA B Normal respiratory effort, no intercostal retractions, no accessory muscle use. Heart: RRR,  no murmur.  Lower extremities: no pretibial edema bilaterally  Skin: Not pale. Not jaundice Neurologic:  alert & oriented X3.  Speech normal, gait appropriate for age and unassisted Psych--  Cognition and judgment appear intact.  Cooperative with normal attention span and concentration.  Behavior appropriate. No anxious or depressed appearing.      Assessment     Assessment DM,  actos intolerant 01-2018, sees Dr Sharl Ma + Mild neuropathy on exam 03/2017 (used to be on gaba for muscle cramps) HTN Hyperlipidemia Hypothyroidism h/oThyroid cancer, nodularity, no mets, thyroidectomy 2013- sees Dr Sharl Ma yearly as off 05/2020 CV: --CHF/nonischemic cardiomyopathy dx 2011 --Ventricular tachycardia, 2011,  EF  ~15 -20 % -- s/p  biventricular ICD 2011--- replaced 04-2016 --CAD -Nonobstructive   by cath 08-2014 --LBBB  GI: --GERD --H/o anemia:  --Gastric ulcer per EGD 07-2010 DJD (hip pain, XRs dosne 2017) Gout H/o asthma as a child    PLAN Strep pharyngitis The patient got sick 2 days ago, + fever, main symptom is sore throat, physical exam consistent with pharyngitis, strep test positive. COVID test negative. O2 sat is 92% at rest. On chart review his O2 sat has ranged from 93% to 98% Plan: Amoxicillin, low-dose  prednisone due to mild uvula swelling, hydrocodone for cough suppression, Tylenol for fever, check O2 sat at home.  Call if not gradually better DM, microalbuminuria: See LOV, microalbumin present, losartan dose increased to 50 mg, follow-up BMP okay

## 2023-05-16 ENCOUNTER — Ambulatory Visit: Payer: PPO | Attending: Internal Medicine

## 2023-05-16 DIAGNOSIS — I5022 Chronic systolic (congestive) heart failure: Secondary | ICD-10-CM

## 2023-05-16 DIAGNOSIS — Z95810 Presence of automatic (implantable) cardiac defibrillator: Secondary | ICD-10-CM | POA: Diagnosis not present

## 2023-05-17 DIAGNOSIS — M7062 Trochanteric bursitis, left hip: Secondary | ICD-10-CM | POA: Diagnosis not present

## 2023-05-17 DIAGNOSIS — M1612 Unilateral primary osteoarthritis, left hip: Secondary | ICD-10-CM | POA: Diagnosis not present

## 2023-05-20 ENCOUNTER — Encounter: Payer: Self-pay | Admitting: Internal Medicine

## 2023-05-20 NOTE — Progress Notes (Signed)
EPIC Encounter for ICM Monitoring  Patient Name: Mike Jimenez is a 72 y.o. male Date: 05/20/2023 Primary Care Physican: Wanda Plump, MD Primary Cardiologist: Ladona Ridgel Electrophysiologist: Rae Roam Pacing:  98.2% 01/26/2023 Weight: 239 lbs 05/20/2023 Weight: 232 lbs   Time in AT/AF    <0.1 hr/day (<0.1%)                                            Spoke with patient and heart failure questions reviewed.  Transmission results reviewed.  Pt asymptomatic for fluid accumulation.   He has chest congestion and sinus drainage over the last couple of weeks along with strep throat.     OptiVol Thoracic impedance suggesting normal fluid levels.     Prescribed:  Furosemide 20 mg 1 tablet (20 mg total) daily.   Potassium 10 mEq 1 tablet (10 mEq total) daily   Labs: 01/14/2023 Creatinine 01.06, BUN 16, Potassium 4.1, Sodium 141  01/03/2023 Creatinine 1.01, BUN 16, Potassium 4.1, Sodium 142  A complete set of results can be found in Results Review.   Recommendations:  No changes and encouraged to call if experiencing any fluid symptoms.   Follow-up plan: ICM clinic phone appointment on 06/20/2023.   91 day device clinic remote transmission 06/22/2023.     EP/Cardiology Office Visits:  Recall 12/01/2023 with Otilio Saber, PA     Copy of ICM check sent to Dr. Ladona Ridgel.    3 month ICM trend: 05/16/2023.    12-14 Month ICM trend:     Karie Soda, RN 05/20/2023 10:55 AM

## 2023-06-10 DIAGNOSIS — M1712 Unilateral primary osteoarthritis, left knee: Secondary | ICD-10-CM | POA: Diagnosis not present

## 2023-06-20 ENCOUNTER — Ambulatory Visit: Payer: PPO

## 2023-06-20 DIAGNOSIS — I5022 Chronic systolic (congestive) heart failure: Secondary | ICD-10-CM

## 2023-06-20 DIAGNOSIS — Z95810 Presence of automatic (implantable) cardiac defibrillator: Secondary | ICD-10-CM | POA: Diagnosis not present

## 2023-06-21 NOTE — Progress Notes (Signed)
EPIC Encounter for ICM Monitoring  Patient Name: Mike Jimenez is a 72 y.o. male Date: 06/21/2023 Primary Care Physican: Wanda Plump, MD Primary Cardiologist: Ladona Ridgel Electrophysiologist: Rae Roam Pacing:  97.5% 01/26/2023 Weight: 239 lbs 05/20/2023 Weight: 232 lbs 06/21/2023 Weight: 225-232 lbs   Time in AT/AF    <0.1 hr/day (<0.1%)                                            Spoke with patient and heart failure questions reviewed.  Transmission results reviewed.  Pt asymptomatic for fluid accumulation.   He had cortisone injection in knee and hip, eating out some and drinking more fluid due to recent strep infection.     OptiVol Thoracic impedance suggesting possible fluid accumulation starting 7/10 but trending back to baseline.     Prescribed:  Furosemide 20 mg 1 tablet (20 mg total) daily.   Potassium 10 mEq 1 tablet (10 mEq total) daily   Labs: 01/14/2023 Creatinine 1.06, BUN 16, Potassium 4.1, Sodium 141  01/03/2023 Creatinine 1.01, BUN 16, Potassium 4.1, Sodium 142  A complete set of results can be found in Results Review.   Recommendations:  Advised to take 1 extra Furosemide tomorrow and then return to 1 tablet daily.     Follow-up plan: ICM clinic phone appointment on 07/25/2023.   91 day device clinic remote transmission 09/21/2023.     EP/Cardiology Office Visits:  Recall 12/01/2023 with Otilio Saber, PA     Copy of ICM check sent to Dr. Ladona Ridgel.    3 month ICM trend: 06/20/2023.    12-14 Month ICM trend:     Karie Soda, RN 06/21/2023 12:16 PM

## 2023-06-22 ENCOUNTER — Ambulatory Visit: Payer: PPO

## 2023-06-22 DIAGNOSIS — I5022 Chronic systolic (congestive) heart failure: Secondary | ICD-10-CM

## 2023-06-23 LAB — CUP PACEART REMOTE DEVICE CHECK
Battery Remaining Longevity: 11 mo
Battery Voltage: 2.87 V
Brady Statistic AP VP Percent: 0.11 %
Brady Statistic AP VS Percent: 0.01 %
Brady Statistic AS VP Percent: 98.48 %
Brady Statistic AS VS Percent: 1.4 %
Brady Statistic RA Percent Paced: 0.13 %
Brady Statistic RV Percent Paced: 78.96 %
Date Time Interrogation Session: 20240724153627
HighPow Impedance: 49 Ohm
HighPow Impedance: 66 Ohm
Implantable Lead Connection Status: 753985
Implantable Lead Connection Status: 753985
Implantable Lead Connection Status: 753985
Implantable Lead Implant Date: 20110516
Implantable Lead Implant Date: 20110516
Implantable Lead Implant Date: 20110516
Implantable Lead Location: 753858
Implantable Lead Location: 753859
Implantable Lead Model: 5076
Implantable Lead Model: 6947
Lead Channel Impedance Value: 285 Ohm
Lead Channel Impedance Value: 342 Ohm
Lead Channel Impedance Value: 399 Ohm
Lead Channel Impedance Value: 437 Ohm
Lead Channel Impedance Value: 570 Ohm
Lead Channel Impedance Value: 874 Ohm
Lead Channel Pacing Threshold Amplitude: 0.75 V
Lead Channel Pacing Threshold Amplitude: 0.875 V
Lead Channel Pacing Threshold Amplitude: 1.375 V
Lead Channel Pacing Threshold Pulse Width: 0.4 ms
Lead Channel Pacing Threshold Pulse Width: 0.6 ms
Lead Channel Sensing Intrinsic Amplitude: 1.875 mV
Lead Channel Sensing Intrinsic Amplitude: 7.5 mV
Lead Channel Sensing Intrinsic Amplitude: 7.5 mV
Lead Channel Setting Pacing Amplitude: 1.75 V
Lead Channel Setting Pacing Amplitude: 2 V
Lead Channel Setting Pacing Amplitude: 2.5 V
Lead Channel Setting Pacing Pulse Width: 0.4 ms
Lead Channel Setting Pacing Pulse Width: 0.6 ms
Lead Channel Setting Sensing Sensitivity: 0.3 mV
Zone Setting Status: 755011

## 2023-07-05 NOTE — Progress Notes (Signed)
Remote ICD transmission.   

## 2023-07-25 ENCOUNTER — Ambulatory Visit: Payer: PPO

## 2023-07-25 ENCOUNTER — Telehealth: Payer: Self-pay

## 2023-07-25 DIAGNOSIS — Z95810 Presence of automatic (implantable) cardiac defibrillator: Secondary | ICD-10-CM | POA: Diagnosis not present

## 2023-07-25 DIAGNOSIS — I5022 Chronic systolic (congestive) heart failure: Secondary | ICD-10-CM | POA: Diagnosis not present

## 2023-07-25 NOTE — Progress Notes (Signed)
EPIC Encounter for ICM Monitoring  Patient Name: Mike Jimenez is a 72 y.o. male Date: 07/25/2023 Primary Care Physican: Wanda Plump, MD Primary Cardiologist: Ladona Ridgel Electrophysiologist: Rae Roam Pacing:  98.2% 01/26/2023 Weight: 239 lbs 05/20/2023 Weight: 232 lbs 06/21/2023 Weight: 225-232 lbs   Time in AT/AF    0.0 hr/day (0.0%)                                            Attempted call to patient and unable to reach.  Left detailed message per DPR regarding transmission. Transmission reviewed.    OptiVol Thoracic impedance suggesting possible fluid accumulation starting 8/10.     Prescribed:  Furosemide 20 mg 1 tablet (20 mg total) daily.   Potassium 10 mEq 1 tablet (10 mEq total) daily   Labs: 01/14/2023 Creatinine 1.06, BUN 16, Potassium 4.1, Sodium 141  01/03/2023 Creatinine 1.01, BUN 16, Potassium 4.1, Sodium 142  A complete set of results can be found in Results Review.   Recommendations: Left voice mail with ICM number and encouraged to call if experiencing any fluid symptoms.   Follow-up plan: ICM clinic phone appointment on 08/02/2023 to recheck fluid levels.   91 day device clinic remote transmission 09/21/2023.     EP/Cardiology Office Visits:  Recall 12/01/2023 with Otilio Saber, PA     Copy of ICM check sent to Dr. Ladona Ridgel.  3 month ICM trend: 07/25/2023.    12-14 Month ICM trend:     Karie Soda, RN 07/25/2023 2:39 PM

## 2023-07-25 NOTE — Telephone Encounter (Signed)
 Remote ICM transmission received.  Attempted call to patient regarding ICM remote transmission and left detailed message per DPR.  Left ICM phone number and advised to return call for any fluid symptoms or questions. Next ICM remote transmission scheduled 08/02/2023.

## 2023-08-02 ENCOUNTER — Ambulatory Visit: Payer: PPO | Attending: Internal Medicine

## 2023-08-02 DIAGNOSIS — I5022 Chronic systolic (congestive) heart failure: Secondary | ICD-10-CM

## 2023-08-02 DIAGNOSIS — Z95810 Presence of automatic (implantable) cardiac defibrillator: Secondary | ICD-10-CM

## 2023-08-04 ENCOUNTER — Telehealth: Payer: Self-pay

## 2023-08-04 NOTE — Progress Notes (Signed)
EPIC Encounter for ICM Monitoring  Patient Name: Mike Jimenez is a 72 y.o. male Date: 08/04/2023 Primary Care Physican: Wanda Plump, MD Primary Cardiologist: Ladona Ridgel Electrophysiologist: Rae Roam Pacing:  97.6% 01/26/2023 Weight: 239 lbs 05/20/2023 Weight: 232 lbs 06/21/2023 Weight: 225-232 lbs   Since 25-Jul-2023 Time in AT/AF  <0.1 hr/day (<0.1%)                                            Attempted call to patient and unable to reach.  Left detailed message per DPR regarding transmission. Transmission reviewed.    OptiVol Thoracic impedance suggesting fluid levels returned to normal.     Prescribed:  Furosemide 20 mg 1 tablet (20 mg total) daily.   Potassium 10 mEq 1 tablet (10 mEq total) daily   Labs: 01/14/2023 Creatinine 1.06, BUN 16, Potassium 4.1, Sodium 141  01/03/2023 Creatinine 1.01, BUN 16, Potassium 4.1, Sodium 142  A complete set of results can be found in Results Review.   Recommendations: Left voice mail with ICM number and encouraged to call if experiencing any fluid symptoms.   Follow-up plan: ICM clinic phone appointment on 09/05/2023.   91 day device clinic remote transmission 09/21/2023.     EP/Cardiology Office Visits:  Recall 12/01/2023 with Otilio Saber, PA     Copy of ICM check sent to Dr. Ladona Ridgel.   3 month ICM trend: 08/02/2023.    12-14 Month ICM trend:     Karie Soda, RN 08/04/2023 10:22 AM

## 2023-08-04 NOTE — Telephone Encounter (Signed)
 Remote ICM transmission received.  Attempted call to patient regarding ICM remote transmission and left detailed message per DPR.  Left ICM phone number and advised to return call for any fluid symptoms or questions. Next ICM remote transmission scheduled 09/05/2023.

## 2023-08-11 DIAGNOSIS — E890 Postprocedural hypothyroidism: Secondary | ICD-10-CM | POA: Diagnosis not present

## 2023-08-11 DIAGNOSIS — Z85850 Personal history of malignant neoplasm of thyroid: Secondary | ICD-10-CM | POA: Diagnosis not present

## 2023-08-11 DIAGNOSIS — E119 Type 2 diabetes mellitus without complications: Secondary | ICD-10-CM | POA: Diagnosis not present

## 2023-08-11 LAB — HM DIABETES FOOT EXAM

## 2023-08-11 LAB — TSH: TSH: 0.01 — AB (ref 0.41–5.90)

## 2023-08-11 LAB — HEMOGLOBIN A1C: Hemoglobin A1C: 6.2

## 2023-08-20 ENCOUNTER — Other Ambulatory Visit: Payer: Self-pay | Admitting: Internal Medicine

## 2023-09-05 ENCOUNTER — Ambulatory Visit: Payer: PPO | Attending: Internal Medicine

## 2023-09-05 DIAGNOSIS — Z95810 Presence of automatic (implantable) cardiac defibrillator: Secondary | ICD-10-CM | POA: Diagnosis not present

## 2023-09-05 DIAGNOSIS — I5022 Chronic systolic (congestive) heart failure: Secondary | ICD-10-CM

## 2023-09-06 ENCOUNTER — Encounter: Payer: Self-pay | Admitting: Internal Medicine

## 2023-09-06 ENCOUNTER — Telehealth: Payer: Self-pay

## 2023-09-06 NOTE — Progress Notes (Signed)
EPIC Encounter for ICM Monitoring  Patient Name: Mike Jimenez is a 72 y.o. male Date: 09/06/2023 Primary Care Physican: Wanda Plump, MD Primary Cardiologist: Ladona Ridgel Electrophysiologist: Rae Roam Pacing:  98.3% 01/26/2023 Weight: 239 lbs 05/20/2023 Weight: 232 lbs 06/21/2023 Weight: 225-232 lbs   Since 02-Aug-2023 Time in AT/AF 0.0 hr/day (0.0%)                                            Attempted call to patient and unable to reach.  Left detailed message per DPR regarding transmission. Transmission reviewed.    OptiVol Thoracic impedance suggesting possible fluid accumulation starting 9/14.     Prescribed:  Furosemide 20 mg 1 tablet (20 mg total) daily.   Potassium 10 mEq 1 tablet (10 mEq total) daily   Labs: 01/14/2023 Creatinine 1.06, BUN 16, Potassium 4.1, Sodium 141  01/03/2023 Creatinine 1.01, BUN 16, Potassium 4.1, Sodium 142  A complete set of results can be found in Results Review.   Recommendations: Left voice mail with ICM number and encouraged to call if experiencing any fluid symptoms.   Follow-up plan: ICM clinic phone appointment on 09/12/2023 to recheck fluid levels.   91 day device clinic remote transmission 09/21/2023.     EP/Cardiology Office Visits:  12/14/2023 with Dr Ladona Ridgel.     Copy of ICM check sent to Dr. Ladona Ridgel.  3 month ICM trend: 09/05/2023.    12-14 Month ICM trend:     Karie Soda, RN 09/06/2023 1:00 PM

## 2023-09-06 NOTE — Telephone Encounter (Signed)
Remote ICM transmission received.  Attempted call to patient regarding ICM remote transmission and no voice mail set up. 

## 2023-09-08 ENCOUNTER — Encounter (HOSPITAL_COMMUNITY): Payer: Self-pay | Admitting: Emergency Medicine

## 2023-09-08 ENCOUNTER — Other Ambulatory Visit: Payer: Self-pay

## 2023-09-08 ENCOUNTER — Emergency Department (HOSPITAL_COMMUNITY)
Admission: EM | Admit: 2023-09-08 | Discharge: 2023-09-09 | Disposition: A | Discharge status: Home / Self Care | Payer: PPO | Attending: Emergency Medicine | Admitting: Emergency Medicine

## 2023-09-08 DIAGNOSIS — I110 Hypertensive heart disease with heart failure: Secondary | ICD-10-CM | POA: Diagnosis not present

## 2023-09-08 DIAGNOSIS — I509 Heart failure, unspecified: Secondary | ICD-10-CM | POA: Insufficient documentation

## 2023-09-08 DIAGNOSIS — Z79899 Other long term (current) drug therapy: Secondary | ICD-10-CM | POA: Diagnosis not present

## 2023-09-08 DIAGNOSIS — Z7982 Long term (current) use of aspirin: Secondary | ICD-10-CM | POA: Insufficient documentation

## 2023-09-08 DIAGNOSIS — R110 Nausea: Secondary | ICD-10-CM | POA: Diagnosis not present

## 2023-09-08 DIAGNOSIS — E119 Type 2 diabetes mellitus without complications: Secondary | ICD-10-CM | POA: Insufficient documentation

## 2023-09-08 DIAGNOSIS — R7989 Other specified abnormal findings of blood chemistry: Secondary | ICD-10-CM

## 2023-09-08 DIAGNOSIS — R5383 Other fatigue: Secondary | ICD-10-CM | POA: Diagnosis not present

## 2023-09-08 DIAGNOSIS — I2510 Atherosclerotic heart disease of native coronary artery without angina pectoris: Secondary | ICD-10-CM | POA: Insufficient documentation

## 2023-09-08 DIAGNOSIS — I10 Essential (primary) hypertension: Secondary | ICD-10-CM | POA: Diagnosis not present

## 2023-09-08 DIAGNOSIS — R531 Weakness: Secondary | ICD-10-CM | POA: Diagnosis not present

## 2023-09-08 DIAGNOSIS — Z85850 Personal history of malignant neoplasm of thyroid: Secondary | ICD-10-CM | POA: Diagnosis not present

## 2023-09-08 DIAGNOSIS — Z7984 Long term (current) use of oral hypoglycemic drugs: Secondary | ICD-10-CM | POA: Insufficient documentation

## 2023-09-08 DIAGNOSIS — R946 Abnormal results of thyroid function studies: Secondary | ICD-10-CM | POA: Insufficient documentation

## 2023-09-08 LAB — CBC
HCT: 38.9 % — ABNORMAL LOW (ref 39.0–52.0)
Hemoglobin: 13.4 g/dL (ref 13.0–17.0)
MCH: 31 pg (ref 26.0–34.0)
MCHC: 34.4 g/dL (ref 30.0–36.0)
MCV: 90 fL (ref 80.0–100.0)
Platelets: 270 10*3/uL (ref 150–400)
RBC: 4.32 MIL/uL (ref 4.22–5.81)
RDW: 12.1 % (ref 11.5–15.5)
WBC: 9.3 10*3/uL (ref 4.0–10.5)
nRBC: 0 % (ref 0.0–0.2)

## 2023-09-08 LAB — BASIC METABOLIC PANEL
Anion gap: 14 (ref 5–15)
BUN: 18 mg/dL (ref 8–23)
CO2: 25 mmol/L (ref 22–32)
Calcium: 9.1 mg/dL (ref 8.9–10.3)
Chloride: 104 mmol/L (ref 98–111)
Creatinine, Ser: 1 mg/dL (ref 0.61–1.24)
GFR, Estimated: >60 mL/min (ref >60)
Glucose, Bld: 130 mg/dL — ABNORMAL HIGH (ref 70–99)
Potassium: 4.2 mmol/L (ref 3.5–5.1)
Sodium: 143 mmol/L (ref 135–145)

## 2023-09-08 LAB — CBG MONITORING, ED: Glucose-Capillary: 130 mg/dL — ABNORMAL HIGH (ref 70–99)

## 2023-09-08 NOTE — ED Triage Notes (Signed)
Pt BIB EMS from home, has had weakness over last week, worse last 3 days, nausea and vomiting over the last 24 hours. Denies SOB or CP. Recently had dose lowered for levothyroxine. Pt is A&Ox4 at this time.

## 2023-09-08 NOTE — ED Provider Triage Note (Signed)
Emergency Medicine Provider Triage Evaluation Note  Mike Jimenez , a 72 y.o. male  was evaluated in triage.  Pt complains of weakness.  Review of Systems  Positive: Generalized weakness and some lightheadedness Negative: Fevers  Physical Exam  BP (!) 142/72 (BP Location: Right Arm)   Pulse 79   Temp 98 F (36.7 C) (Oral)   Resp 18   Ht 6\' 2"  (1.88 m)   Wt 104.3 kg   SpO2 94%   BMI 29.53 kg/m  Awake and appropriate.  Well-appearing. Medical Decision Making  Medically screening exam initiated at 8:12 PM.  Appropriate orders placed.  Mike Jimenez was informed that the remainder of the evaluation will be completed by another provider, this initial triage assessment does not replace that evaluation, and the importance of remaining in the ED until their evaluation is complete.  Generalized weakness.  Felt light headed.  Recent immunizations.  Also was lightheadedness and had some difficulty urinating.  Will get basic blood work to evaluate.   Benjiman Core, MD 09/08/23 2013

## 2023-09-09 ENCOUNTER — Encounter: Payer: Self-pay | Admitting: Internal Medicine

## 2023-09-09 LAB — TSH: TSH: <0.01 u[IU]/mL — ABNORMAL LOW (ref 0.350–4.500)

## 2023-09-09 MED ORDER — ONDANSETRON HCL 4 MG/2ML IJ SOLN
4.0000 mg | Freq: Once | INTRAMUSCULAR | Status: AC
  Administered 2023-09-09: 4 mg via INTRAVENOUS
  Filled 2023-09-09: qty 2

## 2023-09-09 NOTE — ED Notes (Signed)
[employer] Paediatric nurse interrogated

## 2023-09-09 NOTE — ED Notes (Signed)
Pt given Gatorade for PO hydration therapy

## 2023-09-09 NOTE — ED Provider Notes (Incomplete)
[place] EMERGENCY DEPARTMENT AT [facility] Provider Note   CSN: [id number] Arrival date & time: 09/08/23  1924     History {Add pertinent medical, surgical, social history, OB history to HPI:1} Chief Complaint  Patient presents with  . Weakness    Mike Jimenez is a 72 y.o. male.  HPI     This is a 72 year old male who presents with generalized weakness.  Patient reports that he had his COVID, flu, RSV shot last week.  Since that time he has had 4 to 5 days of generalized weakness and fatigue.  He also states he had his levothyroxine adjusted down from 224 mcg to 200 mcg.  He was out in the backyard today when he began to feel dizzy.  He sat down and had resolution of symptoms.  He subsequently got up again and felt dizzy again.  Denies room spinning dizziness.  Describes as lightheadedness.  Has not had any recent cough, fever, chest pain, abdominal pain, nausea, vomiting.  Home Medications Prior to Admission medications   Medication Sig Start Date End Date Taking? Authorizing Provider  ACCU-CHEK GUIDE test strip TEST BLOOD SUGAR ONCE DAILY 06/19/21   [provider]  acetaminophen (TYLENOL) 500 MG tablet Take 1,000 mg by mouth every 6 (six) hours as needed (pain).    [provider]  allopurinol (ZYLOPRIM) 100 MG tablet Take 1 tablet (100 mg total) by mouth daily. 10/11/22   Wanda Plump, MD  amoxicillin (AMOXIL) 875 MG tablet Take 1 tablet (875 mg total) by mouth 2 (two) times daily. 05/09/23   Wanda Plump, MD  Apple Cider Vinegar 188 MG CAPS Take by mouth daily.    [provider]  aspirin EC 81 MG tablet Take 1 tablet (81 mg total) by mouth daily. 08/29/14   Dunn, Tacey Ruiz, PA-C  atorvastatin (LIPITOR) 40 MG tablet Take 1 tablet (40 mg total) by mouth daily. 10/11/22   Wanda Plump, MD  Blood Glucose Monitoring Suppl (ACCU-CHEK GUIDE) w/Device KIT TEST BLOOD SUGAR ONCE DAILY 06/19/21   [provider]  carvedilol (COREG) 12.5 MG tablet  Take 1 tablet (12.5 mg total) by mouth daily. 10/11/22   Wanda Plump, MD  empagliflozin (JARDIANCE) 10 MG TABS tablet Take 1 tablet (10 mg total) by mouth daily before breakfast. Patient not taking: Reported on 05/09/2023 10/11/22   Wanda Plump, MD  furosemide (LASIX) 20 MG tablet Take 1 tablet (20 mg total) by mouth daily. 10/11/22   Wanda Plump, MD  HYDROcodone bit-homatropine [facility]) 5-1.5 MG/5ML syrup Take 5 mLs by mouth at bedtime as needed for cough. 05/09/23   Wanda Plump, MD  levothyroxine (SYNTHROID) 112 MCG tablet Take 2 tablets (224 mcg total) by mouth daily before breakfast. 11/24/20   Wanda Plump, MD  losartan (COZAAR) 50 MG tablet Take 1 tablet (50 mg total) by mouth daily. 08/22/23   Wanda Plump, MD  MAGNESIUM PO Take 1 tablet by mouth daily.    [provider]  metFORMIN (GLUCOPHAGE) 1000 MG tablet Take 1 tablet (1,000 mg total) by mouth 2 (two) times daily with a meal. 10/11/22   Wanda Plump, MD  Multiple Vitamin (MULTIVITAMIN WITH MINERALS) TABS tablet Take 1 tablet by mouth daily.    [provider]  OVER THE COUNTER MEDICATION Apply 1 application topically at bedtime. CBD Cream for neuropathy in feet    [provider]  potassium chloride (KLOR-CON M10) 10 MEQ tablet  Take 1 tablet (10 mEq total) by mouth daily. 10/11/22   Wanda Plump, MD  predniSONE (DELTASONE) 10 MG tablet 2 tabs a day x 5 days 05/09/23   Wanda Plump, MD  Semaglutide, 2 MG/DOSE, (OZEMPIC, 2 MG/DOSE,) 8 MG/3ML SOPN Inject into the skin.    [provider]  tadalafil (CIALIS) 20 MG tablet Take 1 tablet (20 mg total) by mouth daily as needed for erectile dysfunction. 04/11/17   Wanda Plump, MD  vitamin E 400 UNIT capsule Take 400 Units by mouth daily.    [provider]      Allergies    Bee venom and Ramipril    Review of Systems   Review of Systems  Constitutional:  Positive for fatigue. Negative for fever.  Respiratory:  Negative for shortness of breath.    Cardiovascular:  Negative for chest pain.  Gastrointestinal:  Negative for abdominal pain.  Neurological:  Positive for weakness and light-headedness.  All other systems reviewed and are negative.   Physical Exam Updated Vital Signs BP (!) 112/53 (BP Location: Right Arm)   Pulse (!) 54   Temp 98.4 F (36.9 C)   Resp 20   Ht 1.676 m (5\' 6" )   Wt (!) 180.1 kg   SpO2 100%   BMI 64.08 kg/m  Physical Exam Vitals and nursing note reviewed.  Constitutional:      Appearance: He is well-developed.     Comments: Elderly, nontoxic-appearing  HENT:     Head: Normocephalic and atraumatic.     Mouth/Throat:     Mouth: Mucous membranes are moist.  Eyes:     Pupils: Pupils are equal, round, and reactive to light.  Cardiovascular:     Rate and Rhythm: Normal rate and regular rhythm.     Heart sounds: Normal heart sounds. No murmur heard. Pulmonary:     Effort: Pulmonary effort is normal. No respiratory distress.     Breath sounds: Normal breath sounds. No wheezing.  Abdominal:     General: Bowel sounds are normal.     Palpations: Abdomen is soft.     Tenderness: There is no abdominal tenderness. There is no rebound.  Musculoskeletal:     Cervical back: Neck supple.  Lymphadenopathy:     Cervical: No cervical adenopathy.  Skin:    General: Skin is warm and dry.  Neurological:     Mental Status: He is alert and oriented to person, place, and time.     Comments: Cranial nerves II through XII intact, 5 out of 5 strength in all 4 extremities  Psychiatric:        Mood and Affect: Mood normal.     ED Results / Procedures / Treatments   Labs (all labs ordered are listed, but only abnormal results are displayed) Labs Reviewed  BASIC METABOLIC PANEL - Abnormal; Notable for the following components:      Result Value   Glucose, Bld 130 (*)    All other components within normal limits  CBC - Abnormal; Notable for the following components:   HCT 38.9 (*)    All other components  within normal limits  CBG MONITORING, ED - Abnormal; Notable for the following components:   Glucose-Capillary 130 (*)    All other components within normal limits  URINALYSIS, ROUTINE W REFLEX MICROSCOPIC  TSH    EKG None  Radiology No results found.  Procedures Procedures  {Document cardiac monitor, telemetry assessment procedure when appropriate:1}  Medications Ordered in ED  Medications  ondansetron (ZOFRAN) injection 4 mg (4 mg Intravenous Given 09/09/23 0200)    ED Course/ Medical Decision Making/ A&P   {   Click here for ABCD2, HEART and other calculatorsREFRESH Note before signing :1}                              Medical Decision Making Amount and/or Complexity of Data Reviewed Labs: ordered.  Risk Prescription drug management.   ***  {Document critical care time when appropriate:1} {Document review of labs and clinical decision tools ie heart score, Chads2Vasc2 etc:1}  {Document your independent review of radiology images, and any outside records:1} {Document your discussion with family members, caretakers, and with consultants:1} {Document social determinants of health affecting pt's care:1} {Document your decision making why or why not admission, treatments were needed:1} Final Clinical Impression(s) / ED Diagnoses Final diagnoses:  None    Rx / DC Orders ED Discharge Orders     None

## 2023-09-09 NOTE — ED Notes (Signed)
Pt ambulated in hall without difficulty.

## 2023-09-09 NOTE — ED Provider Notes (Signed)
[place] EMERGENCY DEPARTMENT AT [facility] Provider Note   CSN: [id number] Arrival date & time: 09/08/23  1924     History  Chief Complaint  Patient presents with   Weakness    Mike Jimenez is a 72 y.o. male.  HPI     This is a 72 year old male who presents with generalized weakness.  Patient reports that he had his COVID, flu, RSV shot last week.  Since that time he has had 4 to 5 days of generalized weakness and fatigue.  He also states he had his levothyroxine adjusted down from 224 mcg to 200 mcg.  He was out in the backyard today when he began to feel dizzy.  He sat down and had resolution of symptoms.  He subsequently got up again and felt dizzy again.  Denies room spinning dizziness.  Describes as lightheadedness.  Has not had any recent cough, fever, chest pain, abdominal pain, nausea, vomiting.  Home Medications Prior to Admission medications   Medication Sig Start Date End Date Taking? Authorizing Provider  ACCU-CHEK GUIDE test strip TEST BLOOD SUGAR ONCE DAILY 06/19/21   [provider]  acetaminophen (TYLENOL) 500 MG tablet Take 1,000 mg by mouth every 6 (six) hours as needed (pain).    [provider]  allopurinol (ZYLOPRIM) 100 MG tablet Take 1 tablet (100 mg total) by mouth daily. 10/11/22   Wanda Plump, MD  amoxicillin (AMOXIL) 875 MG tablet Take 1 tablet (875 mg total) by mouth 2 (two) times daily. 05/09/23   Wanda Plump, MD  Apple Cider Vinegar 188 MG CAPS Take by mouth daily.    [provider]  aspirin EC 81 MG tablet Take 1 tablet (81 mg total) by mouth daily. 08/29/14   Dunn, Tacey Ruiz, PA-C  atorvastatin (LIPITOR) 40 MG tablet Take 1 tablet (40 mg total) by mouth daily. 10/11/22   Wanda Plump, MD  Blood Glucose Monitoring Suppl (ACCU-CHEK GUIDE) w/Device KIT TEST BLOOD SUGAR ONCE DAILY 06/19/21   [provider]  carvedilol (COREG) 12.5 MG tablet Take 1 tablet (12.5 mg total) by mouth daily. 10/11/22   Wanda Plump,  MD  empagliflozin (JARDIANCE) 10 MG TABS tablet Take 1 tablet (10 mg total) by mouth daily before breakfast. Patient not taking: Reported on 05/09/2023 10/11/22   Wanda Plump, MD  furosemide (LASIX) 20 MG tablet Take 1 tablet (20 mg total) by mouth daily. 10/11/22   Wanda Plump, MD  HYDROcodone bit-homatropine [facility]) 5-1.5 MG/5ML syrup Take 5 mLs by mouth at bedtime as needed for cough. 05/09/23   Wanda Plump, MD  levothyroxine (SYNTHROID) 112 MCG tablet Take 2 tablets (224 mcg total) by mouth daily before breakfast. 11/24/20   Wanda Plump, MD  losartan (COZAAR) 50 MG tablet Take 1 tablet (50 mg total) by mouth daily. 08/22/23   Wanda Plump, MD  MAGNESIUM PO Take 1 tablet by mouth daily.    [provider]  metFORMIN (GLUCOPHAGE) 1000 MG tablet Take 1 tablet (1,000 mg total) by mouth 2 (two) times daily with a meal. 10/11/22   Wanda Plump, MD  Multiple Vitamin (MULTIVITAMIN WITH MINERALS) TABS tablet Take 1 tablet by mouth daily.    [provider]  OVER THE COUNTER MEDICATION Apply 1 application topically at bedtime. CBD Cream for neuropathy in feet    [provider]  potassium chloride (KLOR-CON M10) 10 MEQ tablet Take 1 tablet (10 mEq total) by mouth daily.  10/11/22   Wanda Plump, MD  predniSONE (DELTASONE) 10 MG tablet 2 tabs a day x 5 days 05/09/23   Wanda Plump, MD  Semaglutide, 2 MG/DOSE, (OZEMPIC, 2 MG/DOSE,) 8 MG/3ML SOPN Inject into the skin.    [provider]  tadalafil (CIALIS) 20 MG tablet Take 1 tablet (20 mg total) by mouth daily as needed for erectile dysfunction. 04/11/17   Wanda Plump, MD  vitamin E 400 UNIT capsule Take 400 Units by mouth daily.    [provider]      Allergies    Bee venom and Ramipril    Review of Systems   Review of Systems  Constitutional:  Positive for fatigue. Negative for fever.  Respiratory:  Negative for shortness of breath.   Cardiovascular:  Negative for chest pain.  Gastrointestinal:  Negative for  abdominal pain.  Neurological:  Positive for weakness and light-headedness.  All other systems reviewed and are negative.   Physical Exam Updated Vital Signs BP (!) 112/53 (BP Location: Right Arm)   Pulse (!) 54   Temp 98.4 F (36.9 C)   Resp 20   Ht 1.676 m (5\' 6" )   Wt (!) 180.1 kg   SpO2 100%   BMI 64.08 kg/m  Physical Exam Vitals and nursing note reviewed.  Constitutional:      Appearance: He is well-developed.     Comments: Elderly, nontoxic-appearing  HENT:     Head: Normocephalic and atraumatic.     Mouth/Throat:     Mouth: Mucous membranes are moist.  Eyes:     Pupils: Pupils are equal, round, and reactive to light.  Cardiovascular:     Rate and Rhythm: Normal rate and regular rhythm.     Heart sounds: Normal heart sounds. No murmur heard. Pulmonary:     Effort: Pulmonary effort is normal. No respiratory distress.     Breath sounds: Normal breath sounds. No wheezing.  Abdominal:     General: Bowel sounds are normal.     Palpations: Abdomen is soft.     Tenderness: There is no abdominal tenderness. There is no rebound.  Musculoskeletal:     Cervical back: Neck supple.  Lymphadenopathy:     Cervical: No cervical adenopathy.  Skin:    General: Skin is warm and dry.  Neurological:     Mental Status: He is alert and oriented to person, place, and time.     Comments: Cranial nerves II through XII intact, 5 out of 5 strength in all 4 extremities  Psychiatric:        Mood and Affect: Mood normal.     ED Results / Procedures / Treatments   Labs (all labs ordered are listed, but only abnormal results are displayed) Labs Reviewed  BASIC METABOLIC PANEL - Abnormal; Notable for the following components:      Result Value   Glucose, Bld 130 (*)    All other components within normal limits  CBC - Abnormal; Notable for the following components:   HCT 38.9 (*)    All other components within normal limits  TSH - Abnormal; Notable for the following components:    TSH <0.010 (*)    All other components within normal limits  CBG MONITORING, ED - Abnormal; Notable for the following components:   Glucose-Capillary 130 (*)    All other components within normal limits  URINALYSIS, ROUTINE W REFLEX MICROSCOPIC    EKG EKG Interpretation Date/Time:  Thursday September 08 2023 19:29:31 EDT Ventricular  Rate:  78 PR Interval:  136 QRS Duration:  156 QT Interval:  462 QTC Calculation: 526 R Axis:   270  Text Interpretation: Atrial-sensed ventricular-paced rhythm with occasional Premature ventricular complexes Biventricular pacemaker detected Abnormal ECG When compared with ECG of 15-May-2021 14:40, PREVIOUS ECG IS PRESENT Confirmed by Ross Marcus ([id number]) on 09/09/2023 2:40:09 AM  Radiology No results found.  Procedures Procedures    Medications Ordered in ED Medications  ondansetron (ZOFRAN) injection 4 mg (4 mg Intravenous Given 09/09/23 0200)    ED Course/ Medical Decision Making/ A&P                                Medical Decision Making Amount and/or Complexity of Data Reviewed Labs: ordered.  Risk Prescription drug management.   This patient presents to the ED for concern of nausea weakness, fatigue, this involves an extensive number of treatment options, and is a complaint that carries with it a high risk of complications and morbidity.  I considered the following differential and admission for this acute, potentially life threatening condition.  The differential diagnosis includes dehydration, viral illness, side effects from medication changes, side effects from vaccinations, arrhythmia  MDM:    This is a 72 year old male who presents with generalized weakness and fatigue.  Overall nontoxic and vital signs are reassuring.  EKG is nonischemic.  Basic lab work is largely reassuring.  TSH remains less than 0.01.  Patient could be sensitive to recent change in meds but he is still supratherapeutic based on this.  He hydrated without  difficulty.  He is not orthostatic.  Pacemaker was interrogated and shows no evidence of arrhythmia.  Overall workup is reassuring.  Discussed this with the patient.  He is ambulatory independently.  (Labs, imaging, consults)  Labs: I Ordered, and personally interpreted labs.  The pertinent results include: BC, BMP, TSH  Imaging Studies ordered: I ordered imaging studies including none I independently visualized and interpreted imaging. I agree with the radiologist interpretation  Additional history obtained from chart review.  External records from outside source obtained and reviewed including prior evaluations  Cardiac Monitoring: The patient was maintained on a cardiac monitor.  If on the cardiac monitor, I personally viewed and interpreted the cardiac monitored which showed an underlying rhythm of: Sinus rhythm  Reevaluation: After the interventions noted above, I reevaluated the patient and found that they have :improved  Social Determinants of Health:  lives independently  Disposition: Discharge  Co morbidities that complicate the patient evaluation  Past Medical History:  Diagnosis Date   Anemia    Arthritis    "knees" (09/05/2014)   Automatic implantable cardioverter-defibrillator in situ    CAD (coronary artery disease)    a. Mild nonobstructive CAD by cath 08/2014.   CHF (congestive heart failure) (HCC)    Childhood asthma    Gastric ulcer 2011   GERD (gastroesophageal reflux disease)    History of gout    Hyperlipidemia    Hypertension    LBBB (left bundle branch block)    NICM (nonischemic cardiomyopathy) (HCC)    a. Dx 2011 - presented with sustained VT. Normal coronaries at that time, EF 10-15% by cath and 20-25% by echo. b. s/p biventricular ICD 2011. c. CP 08/2014: mild nonobstructive CAD.   Obesity    Postsurgical hypothyroidism    Thyroid cancer (HCC)    a. 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 Dr Gerrit Friends. s/p thyroidectomy  2013.   Torn  meniscus    Type II diabetes mellitus (HCC)    Ventricular tachycardia (HCC)    a. Sustained VT 2011 s/p MDT [id number] Concert BiV ICD, ser# [id number] H.     Medicines Meds ordered this encounter  Medications   ondansetron (ZOFRAN) injection 4 mg    I have reviewed the patients home medicines and have made adjustments as needed  Problem List / ED Course: Problem List Items Addressed This Visit   None Visit Diagnoses     Weakness    -  Primary   Low TSH level                       Final Clinical Impression(s) / ED Diagnoses Final diagnoses:  Weakness  Low TSH level    Rx / DC Orders ED Discharge Orders     None         Shon Baton, MD 09/09/23 [phone]

## 2023-09-09 NOTE — Discharge Instructions (Addendum)
You were seen today for generalized weakness and fatigue.  This may be a combination of things including recent vaccinations and changes in your thyroid medication.  Your overall workup today is reassuring including pacemaker check.  Make sure that you are staying hydrated.  Follow-up closely with your primary doctor.

## 2023-09-11 ENCOUNTER — Other Ambulatory Visit: Payer: Self-pay | Admitting: Internal Medicine

## 2023-09-12 ENCOUNTER — Encounter: Payer: Self-pay | Admitting: Internal Medicine

## 2023-09-12 ENCOUNTER — Ambulatory Visit: Payer: PPO | Attending: Internal Medicine

## 2023-09-12 DIAGNOSIS — Z95810 Presence of automatic (implantable) cardiac defibrillator: Secondary | ICD-10-CM

## 2023-09-12 DIAGNOSIS — I5022 Chronic systolic (congestive) heart failure: Secondary | ICD-10-CM

## 2023-09-13 NOTE — Progress Notes (Signed)
EPIC Encounter for ICM Monitoring  Patient Name: Mike Jimenez is a 72 y.o. male Date: 09/13/2023 Primary Care Physican: Wanda Plump, MD Primary Cardiologist: Ladona Ridgel Electrophysiologist: Rae Roam Pacing:  98.3% 01/26/2023 Weight: 239 lbs 05/20/2023 Weight: 232 lbs 06/21/2023 Weight: 225-232 lbs 09/13/2023 Weight: 228 lbs   Since 09-Sep-2023 Time in AT/AF 0.0 hr/day (0.0%)                                            Spoke with patient and heart failure questions reviewed.  Transmission results reviewed.  Pt asymptomatic for fluid accumulation.  10/10 ED visit for generalized weakness and fatigue.  He did receive flu, RSV and COVID vaccine the week prior to ED visit.   OptiVol Thoracic impedance suggesting possible fluid accumulation starting 9/14 but not correlating with 10/10 ED visit for dehydration.     Prescribed:  Furosemide 20 mg 1 tablet (20 mg total) daily.   Potassium 10 mEq 1 tablet (10 mEq total) daily   Labs: 09/08/2023 Creatinine 1.00, BUN 18, Potassium 4.2, Sodium 143, GFR >60 01/14/2023 Creatinine 1.06, BUN 16, Potassium 4.1, Sodium 141  01/03/2023 Creatinine 1.01, BUN 16, Potassium 4.1, Sodium 142  A complete set of results can be found in Results Review.   Recommendations:  He is feeling better since ED visit but does require to take more breaks when working outside.  Advised to drink about 64 oz fluid daily to stay hydrated.  He sent mychart message to Dr Ladona Ridgel 10/14 asking if he should adjust Lasix dosage.          Follow-up plan: ICM clinic phone appointment on 09/21/2023 to recheck fluid levels.   91 day device clinic remote transmission 09/21/2023.     EP/Cardiology Office Visits:  12/14/2023 with Dr Ladona Ridgel.     Copy of ICM check sent to Dr. Ladona Ridgel.  3 month ICM trend: 09/12/2023.    12-14 Month ICM trend:     Karie Soda, RN 09/13/2023 8:43 AM

## 2023-09-14 ENCOUNTER — Encounter: Payer: Self-pay | Admitting: Internal Medicine

## 2023-09-14 NOTE — Telephone Encounter (Signed)
Went to the ER with weakness, dizziness. BUN and creatinine essentially normal. Not orthostatic.

## 2023-09-15 DIAGNOSIS — E119 Type 2 diabetes mellitus without complications: Secondary | ICD-10-CM | POA: Diagnosis not present

## 2023-09-15 DIAGNOSIS — E890 Postprocedural hypothyroidism: Secondary | ICD-10-CM | POA: Diagnosis not present

## 2023-09-15 DIAGNOSIS — Z85850 Personal history of malignant neoplasm of thyroid: Secondary | ICD-10-CM | POA: Diagnosis not present

## 2023-09-21 ENCOUNTER — Ambulatory Visit: Payer: PPO | Attending: Internal Medicine

## 2023-09-21 ENCOUNTER — Ambulatory Visit (INDEPENDENT_AMBULATORY_CARE_PROVIDER_SITE_OTHER): Payer: PPO

## 2023-09-21 DIAGNOSIS — I5022 Chronic systolic (congestive) heart failure: Secondary | ICD-10-CM

## 2023-09-21 DIAGNOSIS — Z95810 Presence of automatic (implantable) cardiac defibrillator: Secondary | ICD-10-CM

## 2023-09-21 DIAGNOSIS — I428 Other cardiomyopathies: Secondary | ICD-10-CM

## 2023-09-22 ENCOUNTER — Telehealth: Payer: Self-pay

## 2023-09-22 LAB — CUP PACEART REMOTE DEVICE CHECK
Battery Remaining Longevity: 9 mo
Battery Voltage: 2.86 V
Brady Statistic AP VP Percent: 0.06 %
Brady Statistic AP VS Percent: 0.01 %
Brady Statistic AS VP Percent: 98.1 %
Brady Statistic AS VS Percent: 1.83 %
Brady Statistic RA Percent Paced: 0.07 %
Brady Statistic RV Percent Paced: 95.08 %
Date Time Interrogation Session: 20241023033424
HighPow Impedance: 42 Ohm
HighPow Impedance: 51 Ohm
Implantable Lead Connection Status: 753985
Implantable Lead Connection Status: 753985
Implantable Lead Connection Status: 753985
Implantable Lead Implant Date: 20110516
Implantable Lead Implant Date: 20110516
Implantable Lead Implant Date: 20110516
Implantable Lead Location: 753858
Implantable Lead Location: 753859
Implantable Lead Location: 753860
Implantable Lead Model: 5076
Implantable Lead Model: 6947
Implantable Pulse Generator Implant Date: 20170623
Lead Channel Impedance Value: 266 Ohm
Lead Channel Impedance Value: 323 Ohm
Lead Channel Impedance Value: 380 Ohm
Lead Channel Impedance Value: 399 Ohm
Lead Channel Impedance Value: 551 Ohm
Lead Channel Impedance Value: 817 Ohm
Lead Channel Pacing Threshold Amplitude: 0.625 V
Lead Channel Pacing Threshold Amplitude: 0.875 V
Lead Channel Pacing Threshold Amplitude: 1.25 V
Lead Channel Pacing Threshold Pulse Width: 0.4 ms
Lead Channel Pacing Threshold Pulse Width: 0.4 ms
Lead Channel Pacing Threshold Pulse Width: 0.6 ms
Lead Channel Sensing Intrinsic Amplitude: 11.125 mV
Lead Channel Sensing Intrinsic Amplitude: 11.125 mV
Lead Channel Sensing Intrinsic Amplitude: 2.25 mV
Lead Channel Sensing Intrinsic Amplitude: 2.25 mV
Lead Channel Setting Pacing Amplitude: 2 V
Lead Channel Setting Pacing Amplitude: 2 V
Lead Channel Setting Pacing Amplitude: 2.5 V
Lead Channel Setting Pacing Pulse Width: 0.4 ms
Lead Channel Setting Pacing Pulse Width: 0.6 ms
Lead Channel Setting Sensing Sensitivity: 0.3 mV
Zone Setting Status: 755011

## 2023-09-22 NOTE — Telephone Encounter (Signed)
Remote ICM transmission received.  Attempted call to patient regarding ICM remote transmission and left detailed message per DPR.  Left ICM phone number and advised to return call for any fluid symptoms or questions. Next ICM remote transmission scheduled 10/10/2023.

## 2023-09-22 NOTE — Progress Notes (Signed)
EPIC Encounter for ICM Monitoring  Patient Name: Mike Jimenez is a 72 y.o. male Date: 09/22/2023 Primary Care Physican: Wanda Plump, MD Primary Cardiologist: Ladona Ridgel Electrophysiologist: Rae Roam Pacing:  97.9% 01/26/2023 Weight: 239 lbs 05/20/2023 Weight: 232 lbs 06/21/2023 Weight: 225-232 lbs 09/13/2023 Weight: 228 lbs   Since 09-Sep-2023 Time in AT/AF 0.0 hr/day (0.0%)                                            Attempted call to patient and unable to reach.  Left detailed message per DPR regarding transmission. Transmission reviewed.    OptiVol Thoracic impedance suggesting fluid levels returned to normal 10/18.     Prescribed:  Furosemide 20 mg 1 tablet (20 mg total) daily.   Potassium 10 mEq 1 tablet (10 mEq total) daily   Labs: 09/08/2023 Creatinine 1.00, BUN 18, Potassium 4.2, Sodium 143, GFR >60 01/14/2023 Creatinine 1.06, BUN 16, Potassium 4.1, Sodium 141  01/03/2023 Creatinine 1.01, BUN 16, Potassium 4.1, Sodium 142  A complete set of results can be found in Results Review.   Recommendations:  Left voice mail with ICM number and encouraged to call if experiencing any fluid symptoms.   Follow-up plan: ICM clinic phone appointment on 10/10/2023.   91 day device clinic remote transmission 12/21/2023.     EP/Cardiology Office Visits:  12/14/2023 with Dr Ladona Ridgel.     Copy of ICM check sent to Dr. Ladona Ridgel.  3 month ICM trend: 09/21/2023.    12-14 Month ICM trend:     Karie Soda, RN 09/22/2023 7:32 AM

## 2023-09-29 DIAGNOSIS — M1712 Unilateral primary osteoarthritis, left knee: Secondary | ICD-10-CM | POA: Diagnosis not present

## 2023-10-06 DIAGNOSIS — M1712 Unilateral primary osteoarthritis, left knee: Secondary | ICD-10-CM | POA: Diagnosis not present

## 2023-10-10 ENCOUNTER — Ambulatory Visit: Payer: PPO | Attending: Internal Medicine

## 2023-10-10 DIAGNOSIS — E890 Postprocedural hypothyroidism: Secondary | ICD-10-CM | POA: Diagnosis not present

## 2023-10-10 DIAGNOSIS — Z95810 Presence of automatic (implantable) cardiac defibrillator: Secondary | ICD-10-CM | POA: Diagnosis not present

## 2023-10-10 DIAGNOSIS — I5022 Chronic systolic (congestive) heart failure: Secondary | ICD-10-CM

## 2023-10-10 NOTE — Progress Notes (Signed)
Remote ICD transmission.   

## 2023-10-11 ENCOUNTER — Encounter: Payer: Self-pay | Admitting: Internal Medicine

## 2023-10-11 NOTE — Progress Notes (Signed)
EPIC Encounter for ICM Monitoring  Patient Name: Mike Jimenez is a 72 y.o. male Date: 10/11/2023 Primary Care Physican: Wanda Plump, MD Primary Cardiologist: Ladona Ridgel Electrophysiologist: Rae Roam Pacing:  97.0% 01/26/2023 Weight: 239 lbs 05/20/2023 Weight: 232 lbs 06/21/2023 Weight: 225-232 lbs 09/13/2023 Weight: 228 lbs   Since 21-Sep-2023 Time in AT/AF  <0.1 hr/day (<0.1%)                                            Spoke with patient and heart failure questions reviewed.  Transmission results reviewed.  Pt reports he is extremely tired and has no energy which he reports has occurred since changes occurred with some of his medication.  He reports that he is dehydrated since it took several sticks to draw blood.   Advised the transmission is not correlating with his feelings of dehydration at this time.            OptiVol Thoracic impedance suggesting possible fluid accumulation starting 10/4.     Prescribed:  Furosemide 20 mg 1 tablet (20 mg total) daily.   Potassium 10 mEq 1 tablet (10 mEq total) daily   Labs: 09/08/2023 Creatinine 1.00, BUN 18, Potassium 4.2, Sodium 143, GFR >60 01/14/2023 Creatinine 1.06, BUN 16, Potassium 4.1, Sodium 141  01/03/2023 Creatinine 1.01, BUN 16, Potassium 4.1, Sodium 142  A complete set of results can be found in Results Review.   Recommendations:  Provided EP scheduler number and advised to call for appt with 1st available provider, Dr Ladona Ridgel or PA.  Advised better to be seen in the office since this has been ongoing symptom for past 2-3 months and ED visit in October was dehydration.  He agreed and will contact office today for appt.     Follow-up plan: ICM clinic phone appointment on 10/17/2023 to recheck fluid levels.   91 day device clinic remote transmission 12/21/2023.     EP/Cardiology Office Visits:  12/14/2023 with Dr Ladona Ridgel.     Copy of ICM check sent to Dr. Ladona Ridgel.  3 month ICM trend: 10/10/2023.    12-14 Month ICM trend:      Karie Soda, RN 10/11/2023 12:56 PM

## 2023-10-13 DIAGNOSIS — M1712 Unilateral primary osteoarthritis, left knee: Secondary | ICD-10-CM | POA: Diagnosis not present

## 2023-10-17 ENCOUNTER — Ambulatory Visit: Payer: PPO | Attending: Internal Medicine

## 2023-10-17 DIAGNOSIS — I5022 Chronic systolic (congestive) heart failure: Secondary | ICD-10-CM

## 2023-10-17 DIAGNOSIS — Z95810 Presence of automatic (implantable) cardiac defibrillator: Secondary | ICD-10-CM

## 2023-10-17 NOTE — Progress Notes (Signed)
EPIC Encounter for ICM Monitoring  Patient Name: Mike Jimenez is a 72 y.o. male Date: 10/17/2023 Primary Care Physican: Wanda Plump, MD Primary Cardiologist: Ladona Ridgel Electrophysiologist: Rae Roam Pacing:  98% 01/26/2023 Weight: 239 lbs 05/20/2023 Weight: 232 lbs 06/21/2023 Weight: 225-232 lbs 09/13/2023 Weight: 228 lbs   Clinical Status (10-Oct-2023 to 17-Oct-2023) Time in AT/AF 0.0 hr/day (0.0%)  Battery ERI:  8 months                                            Spoke with patient and heart failure questions reviewed.  Transmission results reviewed.  Pt asymptomatic for fluid accumulation.  Reports feeling well at this time and voices no complaints.     OptiVol Thoracic impedance suggesting fluid levels returned close to normal    Prescribed:  Furosemide 20 mg 1 tablet (20 mg total) daily.   Potassium 10 mEq 1 tablet (10 mEq total) daily   Labs: 09/08/2023 Creatinine 1.00, BUN 18, Potassium 4.2, Sodium 143, GFR >60 01/14/2023 Creatinine 1.06, BUN 16, Potassium 4.1, Sodium 141  01/03/2023 Creatinine 1.01, BUN 16, Potassium 4.1, Sodium 142  A complete set of results can be found in Results Review.   Recommendations:  No changes and encouraged to call if experiencing any fluid symptoms.   Follow-up plan: ICM clinic phone appointment on 11/21/2023.   91 day device clinic remote transmission 12/21/2023.     EP/Cardiology Office Visits:  10/25/2023 with Dr Ladona Ridgel.     Copy of ICM check sent to Dr. Ladona Ridgel.  3 month ICM trend: 10/17/2023.    12-14 Month ICM trend:     Karie Soda, RN 10/17/2023 4:21 PM

## 2023-10-25 ENCOUNTER — Ambulatory Visit: Payer: PPO | Attending: Internal Medicine | Admitting: Internal Medicine

## 2023-10-25 ENCOUNTER — Encounter: Payer: Self-pay | Admitting: Internal Medicine

## 2023-10-25 VITALS — BP 146/72 | HR 64 | Ht 74.5 in | Wt 241.0 lb

## 2023-10-25 DIAGNOSIS — I428 Other cardiomyopathies: Secondary | ICD-10-CM | POA: Diagnosis not present

## 2023-10-25 LAB — CUP PACEART INCLINIC DEVICE CHECK
Battery Remaining Longevity: 7 mo
Battery Voltage: 2.82 V
Brady Statistic AP VP Percent: 0.78 %
Brady Statistic AP VS Percent: 0.05 %
Brady Statistic AS VP Percent: 96.38 %
Brady Statistic AS VS Percent: 2.8 %
Brady Statistic RA Percent Paced: 0.82 %
Brady Statistic RV Percent Paced: 93.16 %
Date Time Interrogation Session: 20241126153400
HighPow Impedance: 43 Ohm
HighPow Impedance: 54 Ohm
Implantable Lead Connection Status: 753985
Implantable Lead Connection Status: 753985
Implantable Lead Connection Status: 753985
Implantable Lead Implant Date: 20110516
Implantable Lead Implant Date: 20110516
Implantable Lead Implant Date: 20110516
Implantable Lead Location: 753858
Implantable Lead Location: 753859
Implantable Lead Location: 753860
Implantable Lead Model: 5076
Implantable Lead Model: 6947
Implantable Pulse Generator Implant Date: 20170623
Lead Channel Impedance Value: 266 Ohm
Lead Channel Impedance Value: 323 Ohm
Lead Channel Impedance Value: 399 Ohm
Lead Channel Impedance Value: 399 Ohm
Lead Channel Impedance Value: 551 Ohm
Lead Channel Impedance Value: 817 Ohm
Lead Channel Pacing Threshold Amplitude: 0.625 V
Lead Channel Pacing Threshold Amplitude: 0.75 V
Lead Channel Pacing Threshold Amplitude: 1 V
Lead Channel Pacing Threshold Pulse Width: 0.4 ms
Lead Channel Pacing Threshold Pulse Width: 0.4 ms
Lead Channel Pacing Threshold Pulse Width: 0.6 ms
Lead Channel Sensing Intrinsic Amplitude: 1.5 mV
Lead Channel Sensing Intrinsic Amplitude: 1.625 mV
Lead Channel Sensing Intrinsic Amplitude: 7.625 mV
Lead Channel Sensing Intrinsic Amplitude: 8.625 mV
Lead Channel Setting Pacing Amplitude: 1.5 V
Lead Channel Setting Pacing Amplitude: 1.75 V
Lead Channel Setting Pacing Amplitude: 2.5 V
Lead Channel Setting Pacing Pulse Width: 0.4 ms
Lead Channel Setting Pacing Pulse Width: 0.6 ms
Lead Channel Setting Sensing Sensitivity: 0.3 mV
Zone Setting Status: 755011

## 2023-10-25 NOTE — Progress Notes (Signed)
HPI Mike Jimenez returns today for followup. He is a pleasant 72 yo man with an non-ICM, chronic systolic CHF, and VT (sustained monomorphic). He is s/p BiV ICD implant. In the interim, he has had no chest pain or sob or ICD shock. No syncope. He was diagnosed Thyroid CA, and underwent surgery. He is on thyroid replacement. He has not had any ICD therapies in the last 2 years. He has retired. He has had no edema. He notes some increased dyspnea and admits to recent dietary indiscretion. Allergies  Allergen Reactions   Bee Venom Swelling   Ramipril Cough     Current Outpatient Medications  Medication Sig Dispense Refill   ACCU-CHEK GUIDE test strip TEST BLOOD SUGAR ONCE DAILY     acetaminophen (TYLENOL) 500 MG tablet Take 1,000 mg by mouth every 6 (six) hours as needed (pain).     allopurinol (ZYLOPRIM) 100 MG tablet Take 1 tablet (100 mg total) by mouth daily. 90 tablet 1   amoxicillin (AMOXIL) 875 MG tablet Take 1 tablet (875 mg total) by mouth 2 (two) times daily. 20 tablet 0   Apple Cider Vinegar 188 MG CAPS Take by mouth daily.     aspirin EC 81 MG tablet Take 1 tablet (81 mg total) by mouth daily.     atorvastatin (LIPITOR) 40 MG tablet Take 1 tablet (40 mg total) by mouth daily. 90 tablet 2   Blood Glucose Monitoring Suppl (ACCU-CHEK GUIDE) w/Device KIT TEST BLOOD SUGAR ONCE DAILY     carvedilol (COREG) 12.5 MG tablet Take 1 tablet (12.5 mg total) by mouth daily. 90 tablet 1   furosemide (LASIX) 20 MG tablet Take 1 tablet (20 mg total) by mouth daily. 90 tablet 1   HYDROcodone bit-homatropine (HYCODAN) 5-1.5 MG/5ML syrup Take 5 mLs by mouth at bedtime as needed for cough. 90 mL 0   levothyroxine (SYNTHROID) 112 MCG tablet Take 2 tablets (224 mcg total) by mouth daily before breakfast.     losartan (COZAAR) 50 MG tablet Take 1 tablet (50 mg total) by mouth daily. 90 tablet 1   MAGNESIUM PO Take 1 tablet by mouth daily.     metFORMIN (GLUCOPHAGE) 1000 MG tablet Take 1 tablet  (1,000 mg total) by mouth 2 (two) times daily with a meal. 180 tablet 2   Multiple Vitamin (MULTIVITAMIN WITH MINERALS) TABS tablet Take 1 tablet by mouth daily.     OVER THE COUNTER MEDICATION Apply 1 application topically at bedtime. CBD Cream for neuropathy in feet     potassium chloride (KLOR-CON M10) 10 MEQ tablet Take 1 tablet (10 mEq total) by mouth daily. 90 tablet 2   predniSONE (DELTASONE) 10 MG tablet 2 tabs a day x 5 days 10 tablet 0   tadalafil (CIALIS) 20 MG tablet Take 1 tablet (20 mg total) by mouth daily as needed for erectile dysfunction. 10 tablet 5   vitamin E 400 UNIT capsule Take 400 Units by mouth daily.     empagliflozin (JARDIANCE) 10 MG TABS tablet Take 1 tablet (10 mg total) by mouth daily before breakfast. (Patient not taking: Reported on 05/09/2023) 90 tablet 2   Semaglutide, 2 MG/DOSE, (OZEMPIC, 2 MG/DOSE,) 8 MG/3ML SOPN Inject into the skin.     No current facility-administered medications for this visit.     Past Medical History:  Diagnosis Date   Anemia    Arthritis    "knees" (09/05/2014)   Automatic implantable cardioverter-defibrillator in situ    CAD (  coronary artery disease)    a. Mild nonobstructive CAD by cath 08/2014.   CHF (congestive heart failure) (HCC)    Childhood asthma    Gastric ulcer 2011   GERD (gastroesophageal reflux disease)    History of gout    Hyperlipidemia    Hypertension    LBBB (left bundle branch block)    NICM (nonischemic cardiomyopathy) (HCC)    a. Dx 2011 - presented with sustained VT. Normal coronaries at that time, EF 10-15% by cath and 20-25% by echo. b. s/p biventricular ICD 2011. c. CP 08/2014: mild nonobstructive CAD.   Obesity    Postsurgical hypothyroidism    Thyroid cancer (HCC)    a. 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 Dr Gerrit Friends. s/p thyroidectomy 2013.   Torn meniscus    Type II diabetes mellitus (HCC)    Ventricular tachycardia (HCC)    a. Sustained VT 2011 s/p MDT D274TRK Concert BiV  ICD, ser# [id number].    ROS:   All systems reviewed and negative except as noted in the HPI.   Past Surgical History:  Procedure Laterality Date   BI-VENTRICULAR IMPLANTABLE CARDIOVERTER DEFIBRILLATOR  (CRT-D)  03/2010   BIOPSY  08/06/2021   Procedure: BIOPSY;  Surgeon: Hilarie Fredrickson, MD;  Location: [facility];  Service: Endoscopy;;   CARDIAC CATHETERIZATION  03/2010   COLONOSCOPY WITH PROPOFOL N/A 08/06/2021   Procedure: COLONOSCOPY WITH PROPOFOL;  Surgeon: Hilarie Fredrickson, MD;  Location: [facility];  Service: Endoscopy;  Laterality: N/A;   EP IMPLANTABLE DEVICE N/A 05/21/2016   Procedure:  ICD Generator Changeout;  Surgeon: Marinus Maw, MD;  Location: [facility] INVASIVE CV LAB;  Service: Cardiovascular;  Laterality: N/A;   LEFT HEART CATHETERIZATION WITH CORONARY ANGIOGRAM N/A 09/06/2014   Procedure: LEFT HEART CATHETERIZATION WITH CORONARY ANGIOGRAM;  Surgeon: Kathleene Hazel, MD;  Location: [facility] CATH LAB;  Service: Cardiovascular;  Laterality: N/A;   POLYPECTOMY  08/06/2021   Procedure: POLYPECTOMY;  Surgeon: Hilarie Fredrickson, MD;  Location: [facility];  Service: Endoscopy;;   PTX     traumatic, age 45 months   THYROIDECTOMY  12/10/2011   Procedure: TOTAL THYROIDECTOMY WITH CENTRAL COMPARTMENT DISSECTION;  Surgeon: Velora Heckler, MD;  Location: [facility] OR;  Service: General;  Laterality: N/A;  Total thyroidectomy with limited central compartment lymph node dissection.   VASECTOMY       Family History  Problem Relation Age of Onset   Atrial fibrillation Father        diseased at ag 52   Hypertension Father        alive @ 74.   Hypothyroidism Mother    Hypertension Mother        alive @ 23.   Diabetes Maternal Grandfather    Diabetes Maternal Aunt    Colon cancer Maternal Aunt    Colon cancer Maternal Aunt    Lung cancer Paternal Uncle    Lung cancer Maternal Uncle    Asthma Sister    Hypothyroidism Daughter    Prostate cancer Neg Hx    CAD Neg Hx      Social History    Socioeconomic History   Marital status: Married    Spouse name: Not on file   Number of children: 2   Years of education: Not on file   Highest education level: Not on file  Occupational History   Occupation: [employer]   Occupation: to retire 2023  Tobacco Use   Smoking status: Former    Current packs/day:  0.00    Average packs/day: 2.5 packs/day for 2.0 years (5.0 ttl pk-yrs)    Types: Cigarettes    Start date: 11/29/1968    Quit date: 11/29/1970    Years since quitting: 52.9   Smokeless tobacco: Former    Types: Associate Professor status: Never Used  Substance and Sexual Activity   Alcohol use: Yes    Comment: drinks rarely   Drug use: No   Sexual activity: Yes  Other Topics Concern   Not on file  Social History Narrative   Live w/ wife , daughter [name] lives in [place]   9 dogs, fish pond.   Job is  physical.   Does not routinely exercise.   Social Determinants of Health   Financial Resource Strain: Low Risk  (01/28/2022)   Overall Financial Resource Strain (CARDIA)    Difficulty of Paying Living Expenses: Not hard at all  Food Insecurity: No Food Insecurity (02/01/2023)   Hunger Vital Sign    Worried About Running Out of Food in the Last Year: Never true    Ran Out of Food in the Last Year: Never true  Transportation Needs: No Transportation Needs (02/01/2023)   PRAPARE - Administrator, Civil Service (Medical): No    Lack of Transportation (Non-Medical): No  Physical Activity: Sufficiently Active (02/01/2023)   Exercise Vital Sign    Days of Exercise per Week: 3 days    Minutes of Exercise per Session: 50 min  Stress: No Stress Concern Present (01/28/2022)   [employer] of Occupational Health - Occupational Stress Questionnaire    Feeling of Stress : Not at all  Social Connections: Moderately Isolated (01/28/2022)   Social Connection and Isolation Panel [NHANES]    Frequency of Communication with Friends and Family: Twice a week    Frequency of  Social Gatherings with Friends and Family: Three times a week    Attends Religious Services: Never    Active Member of Clubs or Organizations: No    Attends Banker Meetings: Never    Marital Status: Married  Catering manager Violence: Not At Risk (02/01/2023)   Humiliation, Afraid, Rape, and Kick questionnaire    Fear of Current or Ex-Partner: No    Emotionally Abused: No    Physically Abused: No    Sexually Abused: No     BP (!) 146/72   Pulse 64   Ht 6' 2.5" (1.892 m)   Wt 241 lb (109.3 kg)   SpO2 98%   BMI 30.53 kg/m   Physical Exam:  Well appearing NAD HEENT: Unremarkable Neck:  No JVD, no thyromegally Lymphatics:  No adenopathy Back:  No CVA tenderness Lungs:  Clear with minimal basilar rales. HEART:  Regular rate rhythm, no murmurs, no rubs, no clicks Abd:  soft, positive bowel sounds, no organomegally, no rebound, no guarding Ext:  2 plus pulses, no edema, no cyanosis, no clubbing Skin:  No rashes no nodules Neuro:  CN II through XII intact, motor grossly intact  DEVICE  Normal device function.  See PaceArt for details.   Assess/Plan:  1. VT - he has not had any ICD therapies.  2. Chronic systolic heart failure- he remains class 2 but his fluid index is up. I asked him to take an extra lasix for the next 3 days and avoid salty foods. 3. ICD - his medtronic Biv ICD is working normally.    Leonia Reeves.D.

## 2023-10-25 NOTE — Patient Instructions (Addendum)
Medication Instructions:  Your physician has recommended you make the following change in your medication:  For the next 3 days : INCREASE your lasix to 2 pills a day. After go back to taking normally.  Lab Work: None ordered.  If you have labs (blood work) drawn today and your tests are completely normal, you will receive your results only by: MyChart Message (if you have MyChart) OR A paper copy in the mail If you have any lab test that is abnormal or we need to change your treatment, we will call you to review the results.  Testing/Procedures: None ordered.  Follow-Up: At [facility], you and your health needs are our priority.  As part of our continuing mission to provide you with exceptional heart care, we have created designated Provider Care Teams.  These Care Teams include your primary Cardiologist (physician) and Advanced Practice Providers (APPs -  Physician Assistants and Nurse Practitioners) who all work together to provide you with the care you need, when you need it.  Your next appointment:   1 year(s)  The format for your next appointment:   In Person  Provider:   Lewayne Bunting, MD{or one of the following Advanced Practice Providers on your designated Care Team:   Francis Dowse, [place] Casimiro Needle "Mardelle Matte" [place], [place] Earnest Rosier, NP  Remote monitoring is used to monitor your Pacemaker/ ICD from home. This monitoring reduces the number of office visits required to check your device to one time per year. It allows [place] to keep an eye on the functioning of your device to ensure it is working properly.  Important Information About Sugar

## 2023-11-08 ENCOUNTER — Encounter: Payer: Self-pay | Admitting: Internal Medicine

## 2023-11-08 ENCOUNTER — Telehealth: Payer: PPO | Admitting: Physician Assistant

## 2023-11-08 DIAGNOSIS — M25473 Effusion, unspecified ankle: Secondary | ICD-10-CM

## 2023-11-08 NOTE — Progress Notes (Signed)
   Thank you for the details you included in the comment boxes. Those details are very helpful in determining the best course of treatment for you and help [place] to provide the best care.Because of mention of injury to area causing symptoms, we recommend that you schedule a Virtual Urgent Care video visit in order for the provider to better assess what is going on.  The provider will be able to give you a more accurate diagnosis and treatment plan if we can more freely discuss your symptoms and with the addition of a virtual examination.   If you change your visit to a video visit, we will bill your insurance (similar to an office visit) and you will not be charged for this e-Visit. You will be able to stay at home and speak with the first available [facility] advanced practice provider. The link to do a video visit is in the drop down Menu tab of your Welcome screen in MyChart.

## 2023-11-09 ENCOUNTER — Emergency Department (HOSPITAL_BASED_OUTPATIENT_CLINIC_OR_DEPARTMENT_OTHER): Payer: PPO

## 2023-11-09 ENCOUNTER — Other Ambulatory Visit: Payer: Self-pay

## 2023-11-09 ENCOUNTER — Inpatient Hospital Stay (HOSPITAL_BASED_OUTPATIENT_CLINIC_OR_DEPARTMENT_OTHER)
Admission: EM | Admit: 2023-11-09 | Discharge: 2023-11-13 | DRG: 271 | Disposition: A | Discharge status: Home / Self Care | Payer: PPO | Attending: Internal Medicine | Admitting: Internal Medicine

## 2023-11-09 ENCOUNTER — Encounter: Payer: Self-pay | Admitting: Internal Medicine

## 2023-11-09 ENCOUNTER — Encounter (HOSPITAL_BASED_OUTPATIENT_CLINIC_OR_DEPARTMENT_OTHER): Payer: Self-pay | Admitting: Urology

## 2023-11-09 ENCOUNTER — Ambulatory Visit: Payer: PPO | Admitting: Internal Medicine

## 2023-11-09 VITALS — BP 126/80 | HR 72 | Temp 98.1°F | Resp 18 | Ht 74.0 in | Wt 244.2 lb

## 2023-11-09 DIAGNOSIS — I10 Essential (primary) hypertension: Secondary | ICD-10-CM | POA: Diagnosis not present

## 2023-11-09 DIAGNOSIS — I5032 Chronic diastolic (congestive) heart failure: Secondary | ICD-10-CM | POA: Diagnosis not present

## 2023-11-09 DIAGNOSIS — Z6831 Body mass index (BMI) 31.0-31.9, adult: Secondary | ICD-10-CM

## 2023-11-09 DIAGNOSIS — I871 Compression of vein: Secondary | ICD-10-CM | POA: Diagnosis not present

## 2023-11-09 DIAGNOSIS — I82422 Acute embolism and thrombosis of left iliac vein: Secondary | ICD-10-CM | POA: Diagnosis not present

## 2023-11-09 DIAGNOSIS — Z79890 Hormone replacement therapy (postmenopausal): Secondary | ICD-10-CM

## 2023-11-09 DIAGNOSIS — Z8249 Family history of ischemic heart disease and other diseases of the circulatory system: Secondary | ICD-10-CM

## 2023-11-09 DIAGNOSIS — I110 Hypertensive heart disease with heart failure: Secondary | ICD-10-CM | POA: Diagnosis present

## 2023-11-09 DIAGNOSIS — E7800 Pure hypercholesterolemia, unspecified: Secondary | ICD-10-CM | POA: Diagnosis not present

## 2023-11-09 DIAGNOSIS — Z91030 Bee allergy status: Secondary | ICD-10-CM | POA: Diagnosis not present

## 2023-11-09 DIAGNOSIS — Z7982 Long term (current) use of aspirin: Secondary | ICD-10-CM

## 2023-11-09 DIAGNOSIS — Z888 Allergy status to other drugs, medicaments and biological substances status: Secondary | ICD-10-CM

## 2023-11-09 DIAGNOSIS — Z825 Family history of asthma and other chronic lower respiratory diseases: Secondary | ICD-10-CM

## 2023-11-09 DIAGNOSIS — I82402 Acute embolism and thrombosis of unspecified deep veins of left lower extremity: Secondary | ICD-10-CM | POA: Diagnosis present

## 2023-11-09 DIAGNOSIS — E669 Obesity, unspecified: Secondary | ICD-10-CM | POA: Diagnosis not present

## 2023-11-09 DIAGNOSIS — E1142 Type 2 diabetes mellitus with diabetic polyneuropathy: Secondary | ICD-10-CM | POA: Diagnosis present

## 2023-11-09 DIAGNOSIS — M109 Gout, unspecified: Secondary | ICD-10-CM | POA: Diagnosis present

## 2023-11-09 DIAGNOSIS — Z87891 Personal history of nicotine dependence: Secondary | ICD-10-CM

## 2023-11-09 DIAGNOSIS — K760 Fatty (change of) liver, not elsewhere classified: Secondary | ICD-10-CM | POA: Diagnosis not present

## 2023-11-09 DIAGNOSIS — Z85850 Personal history of malignant neoplasm of thyroid: Secondary | ICD-10-CM

## 2023-11-09 DIAGNOSIS — I700 Atherosclerosis of aorta: Secondary | ICD-10-CM | POA: Diagnosis not present

## 2023-11-09 DIAGNOSIS — Z79899 Other long term (current) drug therapy: Secondary | ICD-10-CM | POA: Diagnosis not present

## 2023-11-09 DIAGNOSIS — D62 Acute posthemorrhagic anemia: Secondary | ICD-10-CM | POA: Diagnosis not present

## 2023-11-09 DIAGNOSIS — M7989 Other specified soft tissue disorders: Secondary | ICD-10-CM | POA: Diagnosis not present

## 2023-11-09 DIAGNOSIS — K8020 Calculus of gallbladder without cholecystitis without obstruction: Secondary | ICD-10-CM | POA: Diagnosis not present

## 2023-11-09 DIAGNOSIS — Z7984 Long term (current) use of oral hypoglycemic drugs: Secondary | ICD-10-CM | POA: Diagnosis not present

## 2023-11-09 DIAGNOSIS — I82412 Acute embolism and thrombosis of left femoral vein: Secondary | ICD-10-CM | POA: Diagnosis present

## 2023-11-09 DIAGNOSIS — I2510 Atherosclerotic heart disease of native coronary artery without angina pectoris: Secondary | ICD-10-CM | POA: Diagnosis present

## 2023-11-09 DIAGNOSIS — Z833 Family history of diabetes mellitus: Secondary | ICD-10-CM | POA: Diagnosis not present

## 2023-11-09 DIAGNOSIS — I428 Other cardiomyopathies: Secondary | ICD-10-CM | POA: Diagnosis present

## 2023-11-09 DIAGNOSIS — Z95810 Presence of automatic (implantable) cardiac defibrillator: Secondary | ICD-10-CM | POA: Diagnosis not present

## 2023-11-09 DIAGNOSIS — I824Y2 Acute embolism and thrombosis of unspecified deep veins of left proximal lower extremity: Principal | ICD-10-CM

## 2023-11-09 DIAGNOSIS — K219 Gastro-esophageal reflux disease without esophagitis: Secondary | ICD-10-CM | POA: Diagnosis present

## 2023-11-09 LAB — CBC WITH DIFFERENTIAL/PLATELET
Abs Immature Granulocytes: 0.03 10*3/uL (ref 0.00–0.07)
Basophils Absolute: 0 10*3/uL (ref 0.0–0.1)
Basophils Relative: 1 %
Eosinophils Absolute: 0.2 10*3/uL (ref 0.0–0.5)
Eosinophils Relative: 3 %
HCT: 38.2 % — ABNORMAL LOW (ref 39.0–52.0)
Hemoglobin: 13 g/dL (ref 13.0–17.0)
Immature Granulocytes: 1 %
Lymphocytes Relative: 15 %
Lymphs Abs: 1 10*3/uL (ref 0.7–4.0)
MCH: 30.5 pg (ref 26.0–34.0)
MCHC: 34 g/dL (ref 30.0–36.0)
MCV: 89.7 fL (ref 80.0–100.0)
Monocytes Absolute: 0.7 10*3/uL (ref 0.1–1.0)
Monocytes Relative: 11 %
Neutro Abs: 4.6 10*3/uL (ref 1.7–7.7)
Neutrophils Relative %: 69 %
Platelets: 247 10*3/uL (ref 150–400)
RBC: 4.26 MIL/uL (ref 4.22–5.81)
RDW: 13.4 % (ref 11.5–15.5)
WBC: 6.6 10*3/uL (ref 4.0–10.5)
nRBC: 0 % (ref 0.0–0.2)

## 2023-11-09 LAB — COMPREHENSIVE METABOLIC PANEL
ALT: 21 U/L (ref 0–44)
AST: 19 U/L (ref 15–41)
Albumin: 4.4 g/dL (ref 3.5–5.0)
Alkaline Phosphatase: 79 U/L (ref 38–126)
Anion gap: 11 (ref 5–15)
BUN: 24 mg/dL — ABNORMAL HIGH (ref 8–23)
CO2: 24 mmol/L (ref 22–32)
Calcium: 9 mg/dL (ref 8.9–10.3)
Chloride: 101 mmol/L (ref 98–111)
Creatinine, Ser: 1.09 mg/dL (ref 0.61–1.24)
GFR, Estimated: >60 mL/min (ref >60)
Glucose, Bld: 207 mg/dL — ABNORMAL HIGH (ref 70–99)
Potassium: 4.1 mmol/L (ref 3.5–5.1)
Sodium: 136 mmol/L (ref 135–145)
Total Bilirubin: 1.1 mg/dL (ref <1.2)
Total Protein: 7.8 g/dL (ref 6.5–8.1)

## 2023-11-09 LAB — TROPONIN I (HIGH SENSITIVITY): Troponin I (High Sensitivity): 9 ng/L (ref <18)

## 2023-11-09 LAB — GLUCOSE, CAPILLARY: Glucose-Capillary: 193 mg/dL — ABNORMAL HIGH (ref 70–99)

## 2023-11-09 MED ORDER — OXYCODONE HCL 5 MG PO TABS: 5.0000 mg | ORAL_TABLET | Freq: Four times a day (QID) | ORAL | Status: DC | PRN

## 2023-11-09 MED ORDER — HEPARIN BOLUS VIA INFUSION
6000.0000 [IU] | Freq: Once | INTRAVENOUS | Status: AC
  Administered 2023-11-09: 6000 [IU] via INTRAVENOUS

## 2023-11-09 MED ORDER — IOHEXOL 350 MG/ML SOLN
150.0000 mL | Freq: Once | INTRAVENOUS | Status: AC | PRN
  Administered 2023-11-09: 150 mL via INTRAVENOUS

## 2023-11-09 MED ORDER — HEPARIN (PORCINE) 25000 UT/250ML-% IV SOLN
1750.0000 [IU]/h | INTRAVENOUS | Status: DC
  Administered 2023-11-09 – 2023-11-11 (×3): 1750 [IU]/h via INTRAVENOUS
  Filled 2023-11-09 (×4): qty 250

## 2023-11-09 MED ORDER — PROCHLORPERAZINE EDISYLATE 10 MG/2ML IJ SOLN: 5.0000 mg | Freq: Four times a day (QID) | INTRAMUSCULAR | Status: DC | PRN

## 2023-11-09 MED ORDER — HYDROMORPHONE HCL 1 MG/ML IJ SOLN: 0.5000 mg | INTRAMUSCULAR | Status: DC | PRN

## 2023-11-09 MED ORDER — POLYETHYLENE GLYCOL 3350 17 G PO PACK: 17.0000 g | PACK | Freq: Every day | ORAL | Status: DC | PRN

## 2023-11-09 MED ORDER — ACETAMINOPHEN 325 MG PO TABS
650.0000 mg | ORAL_TABLET | Freq: Four times a day (QID) | ORAL | Status: DC | PRN
  Administered 2023-11-12 (×2): 650 mg via ORAL
  Filled 2023-11-09 (×2): qty 2

## 2023-11-09 MED ORDER — MELATONIN 5 MG PO TABS: 5.0000 mg | ORAL_TABLET | Freq: Every evening | ORAL | Status: DC | PRN

## 2023-11-09 NOTE — ED Notes (Signed)
Report given to Shaun with [facility]

## 2023-11-09 NOTE — ED Notes (Signed)
[facility] called for transport. No ETA ED Nurse will call floor for report Called @ 20:30

## 2023-11-09 NOTE — Progress Notes (Addendum)
ANTICOAGULATION CONSULT NOTE - Initial Consult  Pharmacy Consult for Heparin Indication:  VTE treatment  Allergies  Allergen Reactions   Bee Venom Swelling   Amiodarone Other (See Comments)   Ramipril Cough    Patient Measurements: Height: 6\' 2"  (188 cm) Weight: 110.8 kg (244 lb 4.3 oz) IBW/kg (Calculated) : 82.2 Heparin Dosing Weight: 105 kg  Vital Signs: Temp: 98.7 F (37.1 C) (12/11 1554) Temp Source: Oral (12/11 1554) BP: 148/95 (12/11 1554) Pulse Rate: 74 (12/11 1554)  Labs: Recent Labs    11/09/23 1556  HGB 13.0  HCT 38.2*  PLT 247  CREATININE 1.09    Estimated Creatinine Clearance: 82.3 mL/min (by C-G formula based on SCr of 1.09 mg/dL).   Medical History: Past Medical History:  Diagnosis Date   Anemia    Arthritis    "knees" (09/05/2014)   Automatic implantable cardioverter-defibrillator in situ    CAD (coronary artery disease)    a. Mild nonobstructive CAD by cath 08/2014.   CHF (congestive heart failure) (HCC)    Childhood asthma    Gastric ulcer 2011   GERD (gastroesophageal reflux disease)    History of gout    Hyperlipidemia    Hypertension    LBBB (left bundle branch block)    NICM (nonischemic cardiomyopathy) (HCC)    a. Dx 2011 - presented with sustained VT. Normal coronaries at that time, EF 10-15% by cath and 20-25% by echo. b. s/p biventricular ICD 2011. c. CP 08/2014: mild nonobstructive CAD.   Obesity    Postsurgical hypothyroidism    Thyroid cancer (HCC)    a. 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 Dr Gerrit Friends. s/p thyroidectomy 2013.   Torn meniscus    Type II diabetes mellitus (HCC)    Ventricular tachycardia (HCC)    a. Sustained VT 2011 s/p MDT D274TRK Concert BiV ICD, ser# [id number].    Medications:  (Not in a hospital admission)  Scheduled:   heparin  6,000 Units Intravenous Once   Infusions:   heparin     PRN:   Assessment: 65 yom with a history of DM, hypothyroidism, asthma, HF, CAD, HTN, hx of  gout. Patient is presenting with leg swelling x 9 days. Heparin per pharmacy consult placed for  vte treatment .  [place] w/ DVT LLE CTA PE in process  Patient is not on anticoagulation prior to arrival.  Hgb 13; plt 247  Goal of Therapy:  Heparin level 0.3-0.7 units/ml Monitor platelets by anticoagulation protocol: Yes   Plan:  Give IV heparin 6000 units bolus x 1 Start heparin infusion at 1750 units/hr Check anti-Xa level at 0100 and daily while on heparin Continue to monitor H&H and platelets  Delmar Landau, PharmD, BCPS 11/09/2023 5:48 PM ED Clinical Pharmacist -  [phone]

## 2023-11-09 NOTE — Consult Note (Signed)
[facility] [place]  Patient name: Mike Jimenez MRN: [id number] DOB: 1951-06-29 Sex: male   REQUESTING PROVIDER:   ER   REASON FOR CONSULT:    Left leg DVT  HISTORY OF PRESENT ILLNESS:   Mike Jimenez is a 72 y.o. male, who presented to med [facility] today with a 1 week history of left leg swelling beginning at the hip going all the way down his leg.  He feels pressure when he walks.  He denies any chest pain.  He denies any prolonged inactivity.  He did have a left knee injection on November 19 for knee pain.  He denies any prior history of DVT.   The patient has a history of diabetes.  He also suffers from coronary artery disease and congestive heart failure.  He is medically managed for hypertension with an ARB.  He takes a statin for hypercholesterolemia.  PAST MEDICAL HISTORY    Past Medical History:  Diagnosis Date   Anemia    Arthritis    "knees" (09/05/2014)   Automatic implantable cardioverter-defibrillator in situ    CAD (coronary artery disease)    a. Mild nonobstructive CAD by cath 08/2014.   CHF (congestive heart failure) (HCC)    Childhood asthma    Gastric ulcer 2011   GERD (gastroesophageal reflux disease)    History of gout    Hyperlipidemia    Hypertension    LBBB (left bundle branch block)    NICM (nonischemic cardiomyopathy) (HCC)    a. Dx 2011 - presented with sustained VT. Normal coronaries at that time, EF 10-15% by cath and 20-25% by echo. b. s/p biventricular ICD 2011. c. CP 08/2014: mild nonobstructive CAD.   Obesity    Postsurgical hypothyroidism    Thyroid cancer (HCC)    a. 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 Dr Gerrit Friends. s/p thyroidectomy 2013.   Torn meniscus    Type II diabetes mellitus (HCC)    Ventricular tachycardia (HCC)    a. Sustained VT 2011 s/p MDT D274TRK Concert BiV ICD, ser# [id number] H.     FAMILY HISTORY   Family History  Problem Relation  Age of Onset   Atrial fibrillation Father        diseased at ag 15   Hypertension Father        alive @ 105.   Hypothyroidism Mother    Hypertension Mother        alive @ 95.   Diabetes Maternal Grandfather    Diabetes Maternal Aunt    Colon cancer Maternal Aunt    Colon cancer Maternal Aunt    Lung cancer Paternal Uncle    Lung cancer Maternal Uncle    Asthma Sister    Hypothyroidism Daughter    Prostate cancer Neg Hx    CAD Neg Hx     SOCIAL HISTORY:   Social History   Socioeconomic History   Marital status: Married    Spouse name: Not on file   Number of children: 2   Years of education: Not on file   Highest education level: Not on file  Occupational History   Occupation: [employer]   Occupation: to retire 2023  Tobacco Use   Smoking status: Former    Current packs/day: 0.00    Average packs/day: 2.5 packs/day for 2.0 years (5.0 ttl pk-yrs)    Types: Cigarettes    Start date: 11/29/1968    Quit date: 11/29/1970    Years since quitting: 52.9  Smokeless tobacco: Former    Types: Engineer, drilling   Vaping status: Never Used  Substance and Sexual Activity   Alcohol use: Yes    Comment: drinks rarely   Drug use: No   Sexual activity: Yes  Other Topics Concern   Not on file  Social History Narrative   Live w/ wife , daughter [name] lives in [place]   9 dogs, fish pond.   Job is  physical.   Does not routinely exercise.   Social Determinants of Health   Financial Resource Strain: Low Risk  (01/28/2022)   Overall Financial Resource Strain (CARDIA)    Difficulty of Paying Living Expenses: Not hard at all  Food Insecurity: No Food Insecurity (02/01/2023)   Hunger Vital Sign    Worried About Running Out of Food in the Last Year: Never true    Ran Out of Food in the Last Year: Never true  Transportation Needs: No Transportation Needs (02/01/2023)   PRAPARE - Administrator, Civil Service (Medical): No    Lack of Transportation (Non-Medical): No  Physical  Activity: Sufficiently Active (02/01/2023)   Exercise Vital Sign    Days of Exercise per Week: 3 days    Minutes of Exercise per Session: 50 min  Stress: No Stress Concern Present (01/28/2022)   [employer] of Occupational Health - Occupational Stress Questionnaire    Feeling of Stress : Not at all  Social Connections: Moderately Isolated (01/28/2022)   Social Connection and Isolation Panel [NHANES]    Frequency of Communication with Friends and Family: Twice a week    Frequency of Social Gatherings with Friends and Family: Three times a week    Attends Religious Services: Never    Active Member of Clubs or Organizations: No    Attends Banker Meetings: Never    Marital Status: Married  Catering manager Violence: Not At Risk (02/01/2023)   Humiliation, Afraid, Rape, and Kick questionnaire    Fear of Current or Ex-Partner: No    Emotionally Abused: No    Physically Abused: No    Sexually Abused: No    ALLERGIES:    Allergies  Allergen Reactions   Bee Venom Swelling   Amiodarone Other (See Comments)   Ramipril Cough    CURRENT MEDICATIONS:    Current Facility-Administered Medications  Medication Dose Route Frequency Provider Last Rate Last Admin   heparin ADULT infusion 100 units/mL (25000 units/273mL)  1,750 Units/hr Intravenous Continuous Tanda Rockers A, DO 17.5 mL/hr at 11/09/23 1807 1,750 Units/hr at 11/09/23 1807   Current Outpatient Medications  Medication Sig Dispense Refill   ACCU-CHEK GUIDE test strip TEST BLOOD SUGAR ONCE DAILY     acetaminophen (TYLENOL) 500 MG tablet Take 1,000 mg by mouth every 6 (six) hours as needed (pain).     allopurinol (ZYLOPRIM) 100 MG tablet Take 1 tablet (100 mg total) by mouth daily. 90 tablet 1   Apple Cider Vinegar 188 MG CAPS Take by mouth daily.     aspirin EC 81 MG tablet Take 1 tablet (81 mg total) by mouth daily.     atorvastatin (LIPITOR) 40 MG tablet Take 1 tablet (40 mg total) by mouth daily. 90 tablet 2    Blood Glucose Monitoring Suppl (ACCU-CHEK GUIDE) w/Device KIT TEST BLOOD SUGAR ONCE DAILY     carvedilol (COREG) 12.5 MG tablet Take 1 tablet (12.5 mg total) by mouth daily. 90 tablet 1   empagliflozin (JARDIANCE) 10 MG TABS tablet Take 1  tablet (10 mg total) by mouth daily before breakfast. (Patient not taking: Reported on 05/09/2023) 90 tablet 2   furosemide (LASIX) 20 MG tablet Take 1 tablet (20 mg total) by mouth daily. 90 tablet 1   levothyroxine (SYNTHROID) 112 MCG tablet Take 2 tablets (224 mcg total) by mouth daily before breakfast.     losartan (COZAAR) 50 MG tablet Take 1 tablet (50 mg total) by mouth daily. 90 tablet 1   MAGNESIUM PO Take 1 tablet by mouth daily.     metFORMIN (GLUCOPHAGE) 1000 MG tablet Take 1 tablet (1,000 mg total) by mouth 2 (two) times daily with a meal. 180 tablet 2   Multiple Vitamin (MULTIVITAMIN WITH MINERALS) TABS tablet Take 1 tablet by mouth daily.     OVER THE COUNTER MEDICATION Apply 1 application topically at bedtime. CBD Cream for neuropathy in feet     potassium chloride (KLOR-CON M10) 10 MEQ tablet Take 1 tablet (10 mEq total) by mouth daily. 90 tablet 2   tadalafil (CIALIS) 20 MG tablet Take 1 tablet (20 mg total) by mouth daily as needed for erectile dysfunction. 10 tablet 5   vitamin E 400 UNIT capsule Take 400 Units by mouth daily.      REVIEW OF SYSTEMS:   [X]  denotes positive finding, [ ]  denotes negative finding Cardiac  Comments:  Chest pain or chest pressure: ***   Shortness of breath upon exertion:    Short of breath when lying flat:    Irregular heart rhythm:        Vascular    Pain in calf, thigh, or hip brought on by ambulation:    Pain in feet at night that wakes you up from your sleep:     Blood clot in your veins:    Leg swelling:         Pulmonary    Oxygen at home:    Productive cough:     Wheezing:         Neurologic    Sudden weakness in arms or legs:     Sudden numbness in arms or legs:     Sudden onset of  difficulty speaking or slurred speech:    Temporary loss of vision in one eye:     Problems with dizziness:         Gastrointestinal    Blood in stool:      Vomited blood:         Genitourinary    Burning when urinating:     Blood in urine:        Psychiatric    Major depression:         Hematologic    Bleeding problems:    Problems with blood clotting too easily:        Skin    Rashes or ulcers:        Constitutional    Fever or chills:     PHYSICAL EXAM:   Vitals:   11/09/23 1900 11/09/23 1941 11/09/23 2000 11/09/23 2135  BP: (!) 153/82  (!) 138/91 (!) 152/89  Pulse: 65  68 71  Resp: 17  15 16   Temp:  98.7 F (37.1 C)  98.2 F (36.8 C)  TempSrc:  Oral  Oral  SpO2: 94%  98% 96%  Weight:      Height:        GENERAL: The patient is a well-nourished male, in no acute distress. The vital signs are documented above. CARDIAC: There is a  regular rate and rhythm.  VASCULAR: *** PULMONARY: Nonlabored respirations ABDOMEN: Soft and non-tender with normal pitched bowel sounds.  MUSCULOSKELETAL: There are no major deformities or cyanosis. NEUROLOGIC: No focal weakness or paresthesias are detected. SKIN: There are no ulcers or rashes noted. PSYCHIATRIC: The patient has a normal affect.  STUDIES:   I have reviewed the following studies: CT venogram: Left deep venous thrombosis involving the left internal and external iliac veins, left common femoral vein, and visualized superficial and deep femoral veins in the upper left thigh.   Cholelithiasis.   Hepatic steatosis   Aortic atherosclerosis.   Ultrasound: VENOUS   Diffusely including the common femoral, femoral, popliteal vein there is poor compression and flow on color and spectral Doppler involving the vasculature consistent with the extensive DVT. Preserved appearance of the posterior tibial vein.  ASSESSMENT and PLAN   ***   Charlena Cross, MD, FACS [facility]  [facility] [phone] Pager [phone]

## 2023-11-09 NOTE — ED Notes (Signed)
Report given to [facility] RN @ [facility]

## 2023-11-09 NOTE — Progress Notes (Signed)
Plan of Care Note for accepted transfer   Patient: Mike Jimenez MRN: [id number]   DOA: 11/09/2023  Facility requesting transfer: [facility]   Requesting Provider: Dr. Audelia Acton   Reason for transfer: Extensive LLE DVT   Facility course: 72 yr old man with hx of HTN, DM, thyroid cancer, asthma, CAD, and HFpEF who presents with LLE pain and swelling.   US reveals extensive LLE DVT. CTA chest is negative for PE.   Vascular surgery (Dr. Myra Gianotti) was consulted by the ED and will have the patient seen in the morning.  IV heparin was started.  Plan of care: The patient is accepted for admission to Telemetry unit, at [facility].   Author: Briscoe Deutscher, MD 11/09/2023  Check [email or web address] for on-call coverage.  Nursing staff, Please call [facility] Admits & Consults System-Wide number on Amion as soon as patient's arrival, so appropriate admitting provider can evaluate the pt.

## 2023-11-09 NOTE — Plan of Care (Signed)
  Problem: Education: Goal: Knowledge of General Education information will improve Description: Including pain rating scale, medication(s)/side effects and non-pharmacologic comfort measures Outcome: Progressing   Problem: Health Behavior/Discharge Planning: Goal: Ability to manage health-related needs will improve Outcome: Progressing   Problem: Clinical Measurements: Goal: Ability to maintain clinical measurements within normal limits will improve Outcome: Progressing   Problem: Clinical Measurements: Goal: Ability to maintain clinical measurements within normal limits will improve Outcome: Progressing   Problem: Clinical Measurements: Goal: Ability to maintain clinical measurements within normal limits will improve Outcome: Progressing Goal: Will remain free from infection Outcome: Progressing Goal: Diagnostic test results will improve Outcome: Progressing Goal: Respiratory complications will improve Outcome: Progressing Goal: Cardiovascular complication will be avoided Outcome: Progressing

## 2023-11-09 NOTE — ED Notes (Signed)
Called lab re: trop that was drawn @ 1556 Lab tech states he is running it now

## 2023-11-09 NOTE — ED Notes (Signed)
Pt requested ginger ale, coffee, cream and sugar and a kind bar. Pt given the same. OK per RN Ranette

## 2023-11-09 NOTE — Progress Notes (Unsigned)
Subjective:    Patient ID: Mike Jimenez, male    DOB: 1951-11-17, 72 y.o.   MRN: [id number]  DOS:  11/09/2023 Type of visit - description: Acute, here with his wife, left leg swelling  The patient got a left knee injection 10/17/2024 for knee pain.  The area did not get red or swollen. Saw cardiology 10/25/2023, was recommended some extra Lasix due to volume overload.  He presents here with left leg swelling for 1 week. The swelling starts from the hip down. No pain per se except when he walks, he feels some pressure.  No fever or chills No chest pain or palpitation No nausea or blood in the stools. No recent airplane trips or prolonged car trips.    Review of Systems See above   Past Medical History:  Diagnosis Date   Anemia    Arthritis    "knees" (09/05/2014)   Automatic implantable cardioverter-defibrillator in situ    CAD (coronary artery disease)    a. Mild nonobstructive CAD by cath 08/2014.   CHF (congestive heart failure) (HCC)    Childhood asthma    Gastric ulcer 2011   GERD (gastroesophageal reflux disease)    History of gout    Hyperlipidemia    Hypertension    LBBB (left bundle branch block)    NICM (nonischemic cardiomyopathy) (HCC)    a. Dx 2011 - presented with sustained VT. Normal coronaries at that time, EF 10-15% by cath and 20-25% by echo. b. s/p biventricular ICD 2011. c. CP 08/2014: mild nonobstructive CAD.   Obesity    Postsurgical hypothyroidism    Thyroid cancer (HCC)    a. 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 Dr Gerrit Friends. s/p thyroidectomy 2013.   Torn meniscus    Type II diabetes mellitus (HCC)    Ventricular tachycardia (HCC)    a. Sustained VT 2011 s/p MDT D274TRK Concert BiV ICD, ser# [id number].    Past Surgical History:  Procedure Laterality Date   BI-VENTRICULAR IMPLANTABLE CARDIOVERTER DEFIBRILLATOR  (CRT-D)  03/2010   BIOPSY  08/06/2021   Procedure: BIOPSY;  Surgeon: Hilarie Fredrickson, MD;  Location: [facility];   Service: Endoscopy;;   CARDIAC CATHETERIZATION  03/2010   COLONOSCOPY WITH PROPOFOL N/A 08/06/2021   Procedure: COLONOSCOPY WITH PROPOFOL;  Surgeon: Hilarie Fredrickson, MD;  Location: [facility];  Service: Endoscopy;  Laterality: N/A;   EP IMPLANTABLE DEVICE N/A 05/21/2016   Procedure:  ICD Generator Changeout;  Surgeon: Marinus Maw, MD;  Location: [facility] INVASIVE CV LAB;  Service: Cardiovascular;  Laterality: N/A;   LEFT HEART CATHETERIZATION WITH CORONARY ANGIOGRAM N/A 09/06/2014   Procedure: LEFT HEART CATHETERIZATION WITH CORONARY ANGIOGRAM;  Surgeon: Kathleene Hazel, MD;  Location: [facility] CATH LAB;  Service: Cardiovascular;  Laterality: N/A;   POLYPECTOMY  08/06/2021   Procedure: POLYPECTOMY;  Surgeon: Hilarie Fredrickson, MD;  Location: [facility];  Service: Endoscopy;;   PTX     traumatic, age 22 months   THYROIDECTOMY  12/10/2011   Procedure: TOTAL THYROIDECTOMY WITH CENTRAL COMPARTMENT DISSECTION;  Surgeon: Velora Heckler, MD;  Location: [facility] OR;  Service: General;  Laterality: N/A;  Total thyroidectomy with limited central compartment lymph node dissection.   VASECTOMY      Current Outpatient Medications  Medication Instructions   ACCU-CHEK GUIDE test strip TEST BLOOD SUGAR ONCE DAILY   acetaminophen (TYLENOL) 1,000 mg, Oral, Every 6 hours PRN   allopurinol (ZYLOPRIM) 100 mg, Oral, Daily   amoxicillin (AMOXIL) 875 mg, Oral,  2 times daily   Apple Cider Vinegar 188 MG CAPS Oral, Daily   aspirin EC 81 mg, Oral, Daily   atorvastatin (LIPITOR) 40 mg, Oral, Daily   Blood Glucose Monitoring Suppl (ACCU-CHEK GUIDE) w/Device KIT TEST BLOOD SUGAR ONCE DAILY   carvedilol (COREG) 12.5 mg, Oral, Daily   empagliflozin (JARDIANCE) 10 mg, Oral, Daily before breakfast   furosemide (LASIX) 20 mg, Oral, Daily   HYDROcodone bit-homatropine (HYCODAN) 5-1.5 MG/5ML syrup 5 mLs, Oral, At bedtime PRN   levothyroxine (SYNTHROID) 224 mcg, Oral, Daily before breakfast   losartan (COZAAR) 50 mg, Oral, Daily    MAGNESIUM PO 1 tablet, Oral, Daily   metFORMIN (GLUCOPHAGE) 1,000 mg, Oral, 2 times daily with meals   Multiple Vitamin (MULTIVITAMIN WITH MINERALS) TABS tablet 1 tablet, Oral, Daily   OVER THE COUNTER MEDICATION 1 application , Topical, Daily at bedtime, CBD Cream for neuropathy in feet   potassium chloride (KLOR-CON M10) 10 MEQ tablet 10 mEq, Oral, Daily   predniSONE (DELTASONE) 10 MG tablet 2 tabs a day x 5 days   Semaglutide, 2 MG/DOSE, (OZEMPIC, 2 MG/DOSE,) 8 MG/3ML SOPN Inject into the skin.   tadalafil (CIALIS) 20 mg, Oral, Daily PRN   vitamin E 400 Units, Daily       Objective:   Physical Exam BP 126/80   Pulse 72   Temp 98.1 F (36.7 C) (Oral)   Resp 18   Ht 6\' 2"  (1.88 m)   Wt 244 lb 4 oz (110.8 kg)   SpO2 96%   BMI 31.36 kg/m  General:   Well developed, NAD, BMI noted. HEENT:  Normocephalic . Face symmetric, atraumatic Lungs:  CTA B Normal respiratory effort, no intercostal retractions, no accessory muscle use. Heart: RRR,  no murmur.  Lower extremities:  Bilateral femoral pulses normal. Toes with good perfusion throughout. R leg normal. L leg: + Swelling from the left hip down, the thigh and calf feel  tight, and they are large (circumference is 2.5 inches larger on the calf 3 inches larger on the thigh).    Skin: Not pale. Not jaundice Neurologic:  alert & oriented X3.  Speech normal, gait appropriate for age and unassisted Psych--  Cognition and judgment appear intact.  Cooperative with normal attention span and concentration.  Behavior appropriate. No anxious or depressed appearing.      Assessment   Assessment DM,  actos intolerant 01-2018, sees Dr Sharl Ma + Mild neuropathy on exam 03/2017 (used to be on gaba for muscle cramps) HTN Hyperlipidemia Hypothyroidism h/oThyroid cancer, nodularity, no mets, thyroidectomy 2013- sees Dr Sharl Ma yearly as off 05/2020 CV: --CHF/nonischemic cardiomyopathy dx 2011 --Ventricular tachycardia, 2011,  EF  ~15 -20  % -- s/p  biventricular ICD 2011--- replaced 04-2016 --CAD -Nonobstructive   by cath 08-2014 --LBBB  GI: --GERD --H/o anemia:  --Gastric ulcer per EGD 07-2010 DJD (hip pain, XRs dosne 2017) Gout H/o asthma as a child    PLAN Left leg swelling: 1 week history of left leg swelling as described above, strongly suspect DVT on clinical grounds. Given extent of the swelling I am recommending him to go to the ER for immediate attention.  If DVT is confirmed, a admission to hospital for observation could be entertained. History of gout, uric acid 10 months ago 6.2, on allopurinol, no evidence of clinical gout at this point.

## 2023-11-09 NOTE — Patient Instructions (Signed)
Please go to the emergency room downstairs I already contacted the physician there

## 2023-11-09 NOTE — H&P (Signed)
History and Physical  MCCRAE CHANN YHC:[id number] DOB: [birth year] DOA: 11/09/2023  Referring physician: Accepted by Dr. Sherrell Puller, hospitalist service. PCP: Wanda Plump, MD  Outpatient Specialists: Cardiology. Patient coming from: Home.  Chief Complaint: Left lower extremity swelling and pain.  HPI: Mike Jimenez is a 72 y.o. male with medical history significant for coronary artery disease, type 2 diabetes, hypertension, chronic HFpEF, obesity, thyroid cancer, gout, who presented to the ER due to left lower extremity edema and pain that is worse with ambulation for the past 9 days.  States he started noticing symptoms after steroid injection of his left knee around October 18, 2023.  Also endorses taking the COVID 19 vaccine RSV and influenza vaccine on at once in October 2024.  Denies any trauma or lengthy trips.  He has been elevating his left lower extremity for the past few days without significant improvement.  He initially went to his PCP and was sent to the ER to rule out a DVT.  In the ED, CT venogram showed extensive left lower extremity DVT.  CT angio pulmonary was negative for acute pulmonary embolism.    EDP discussed the case with vascular surgery, recommended admission at [facility].  Will be seen by vascular surgery.  Admitted by [facility], hospitalist service, and transferred to [facility] as direct admit to telemetry medical unit, observation status.  ED Course: Temperature 98.3.  BP 144/73, pulse 67, respiratory 19, O2 saturation 98% on room air.  Review of Systems: Review of systems as noted in the HPI. All other systems reviewed and are negative.   Past Medical History:  Diagnosis Date   Anemia    Arthritis    "knees" (09/05/2014)   Automatic implantable cardioverter-defibrillator in situ    CAD (coronary artery disease)    a. Mild nonobstructive CAD by cath 08/2014.   CHF (congestive heart failure) (HCC)    Childhood asthma    Gastric ulcer 2011    GERD (gastroesophageal reflux disease)    History of gout    Hyperlipidemia    Hypertension    LBBB (left bundle branch block)    NICM (nonischemic cardiomyopathy) (HCC)    a. Dx 2011 - presented with sustained VT. Normal coronaries at that time, EF 10-15% by cath and 20-25% by echo. b. s/p biventricular ICD 2011. c. CP 08/2014: mild nonobstructive CAD.   Obesity    Postsurgical hypothyroidism    Thyroid cancer (HCC)    a. 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 Dr Gerrit Friends. s/p thyroidectomy 2013.   Torn meniscus    Type II diabetes mellitus (HCC)    Ventricular tachycardia (HCC)    a. Sustained VT 2011 s/p MDT D274TRK Concert BiV ICD, ser# [id number] H.   Past Surgical History:  Procedure Laterality Date   BI-VENTRICULAR IMPLANTABLE CARDIOVERTER DEFIBRILLATOR  (CRT-D)  03/2010   BIOPSY  08/06/2021   Procedure: BIOPSY;  Surgeon: Hilarie Fredrickson, MD;  Location: [facility];  Service: Endoscopy;;   CARDIAC CATHETERIZATION  03/2010   COLONOSCOPY WITH PROPOFOL N/A 08/06/2021   Procedure: COLONOSCOPY WITH PROPOFOL;  Surgeon: Hilarie Fredrickson, MD;  Location: [facility];  Service: Endoscopy;  Laterality: N/A;   EP IMPLANTABLE DEVICE N/A 05/21/2016   Procedure:  ICD Generator Changeout;  Surgeon: Marinus Maw, MD;  Location: [facility] INVASIVE CV LAB;  Service: Cardiovascular;  Laterality: N/A;   LEFT HEART CATHETERIZATION WITH CORONARY ANGIOGRAM N/A 09/06/2014   Procedure: LEFT HEART CATHETERIZATION WITH CORONARY ANGIOGRAM;  Surgeon:  Kathleene Hazel, MD;  Location: [facility] CATH LAB;  Service: Cardiovascular;  Laterality: N/A;   POLYPECTOMY  08/06/2021   Procedure: POLYPECTOMY;  Surgeon: Hilarie Fredrickson, MD;  Location: [facility];  Service: Endoscopy;;   PTX     traumatic, age 62 months   THYROIDECTOMY  12/10/2011   Procedure: TOTAL THYROIDECTOMY WITH CENTRAL COMPARTMENT DISSECTION;  Surgeon: Velora Heckler, MD;  Location: [facility] OR;  Service: General;  Laterality: N/A;  Total thyroidectomy with  limited central compartment lymph node dissection.   VASECTOMY      Social History:  reports that he quit smoking about 52 years ago. His smoking use included cigarettes. He started smoking about 54 years ago. He has a 5 pack-year smoking history. He has quit using smokeless tobacco.  His smokeless tobacco use included chew. He reports current alcohol use. He reports that he does not use drugs.   Allergies  Allergen Reactions   Bee Venom Swelling   Amiodarone Other (See Comments)   Ramipril Cough    Family History  Problem Relation Age of Onset   Atrial fibrillation Father        diseased at ag 2   Hypertension Father        alive @ 29.   Hypothyroidism Mother    Hypertension Mother        alive @ 67.   Diabetes Maternal Grandfather    Diabetes Maternal Aunt    Colon cancer Maternal Aunt    Colon cancer Maternal Aunt    Lung cancer Paternal Uncle    Lung cancer Maternal Uncle    Asthma Sister    Hypothyroidism Daughter    Prostate cancer Neg Hx    CAD Neg Hx       Prior to Admission medications   Medication Sig Start Date End Date Taking? Authorizing Provider  ACCU-CHEK GUIDE test strip TEST BLOOD SUGAR ONCE DAILY 06/19/21   [provider]  acetaminophen (TYLENOL) 500 MG tablet Take 1,000 mg by mouth every 6 (six) hours as needed (pain).    [provider]  allopurinol (ZYLOPRIM) 100 MG tablet Take 1 tablet (100 mg total) by mouth daily. 09/12/23   Wanda Plump, MD  Apple Cider Vinegar 188 MG CAPS Take by mouth daily.    [provider]  aspirin EC 81 MG tablet Take 1 tablet (81 mg total) by mouth daily. 08/29/14   Dunn, Tacey Ruiz, PA-C  atorvastatin (LIPITOR) 40 MG tablet Take 1 tablet (40 mg total) by mouth daily. 10/11/22   Wanda Plump, MD  Blood Glucose Monitoring Suppl (ACCU-CHEK GUIDE) w/Device KIT TEST BLOOD SUGAR ONCE DAILY 06/19/21   [provider]  carvedilol (COREG) 12.5 MG tablet Take 1 tablet (12.5 mg total) by mouth daily.  09/12/23   Wanda Plump, MD  empagliflozin (JARDIANCE) 10 MG TABS tablet Take 1 tablet (10 mg total) by mouth daily before breakfast. Patient not taking: Reported on 05/09/2023 10/11/22   Wanda Plump, MD  furosemide (LASIX) 20 MG tablet Take 1 tablet (20 mg total) by mouth daily. 09/12/23   Wanda Plump, MD  levothyroxine (SYNTHROID) 112 MCG tablet Take 2 tablets (224 mcg total) by mouth daily before breakfast. 11/24/20   Wanda Plump, MD  losartan (COZAAR) 50 MG tablet Take 1 tablet (50 mg total) by mouth daily. 08/22/23   Wanda Plump, MD  MAGNESIUM PO Take 1 tablet by mouth daily.    [provider]  metFORMIN (  GLUCOPHAGE) 1000 MG tablet Take 1 tablet (1,000 mg total) by mouth 2 (two) times daily with a meal. 10/11/22   Wanda Plump, MD  Multiple Vitamin (MULTIVITAMIN WITH MINERALS) TABS tablet Take 1 tablet by mouth daily.    [provider]  OVER THE COUNTER MEDICATION Apply 1 application topically at bedtime. CBD Cream for neuropathy in feet    [provider]  potassium chloride (KLOR-CON M10) 10 MEQ tablet Take 1 tablet (10 mEq total) by mouth daily. 10/11/22   Wanda Plump, MD  tadalafil (CIALIS) 20 MG tablet Take 1 tablet (20 mg total) by mouth daily as needed for erectile dysfunction. 04/11/17   Wanda Plump, MD  vitamin E 400 UNIT capsule Take 400 Units by mouth daily.    [provider]    Physical Exam: BP (!) 161/91 (BP Location: Right Arm)   Pulse 72   Temp 98.3 F (36.8 C) (Oral)   Resp 18   Ht 6\' 2"  (1.88 m)   Wt 110.8 kg   SpO2 97%   BMI 31.36 kg/m   General: 72 y.o. year-old male well developed well nourished in no acute distress.  Alert and oriented x3. Cardiovascular: Regular rate and rhythm with no rubs or gallops.  No thyromegaly or JVD noted.  3+ pitting edema in the left lower extremity all the way up to his thigh. Respiratory: Clear to auscultation with no wheezes or rales. Good inspiratory effort. Abdomen: Soft nontender nondistended  with normal bowel sounds x4 quadrants. Muskuloskeletal: No cyanosis, clubbing or edema noted bilaterally Neuro: CN II-XII intact, strength, sensation, reflexes Skin: No ulcerative lesions noted or rashes Psychiatry: Judgement and insight appear normal. Mood is appropriate for condition and setting          Labs on Admission:  Basic Metabolic Panel: Recent Labs  Lab 11/09/23 1556  NA 136  K 4.1  CL 101  CO2 24  GLUCOSE 207*  BUN 24*  CREATININE 1.09  CALCIUM 9.0   Liver Function Tests: Recent Labs  Lab 11/09/23 1556  AST 19  ALT 21  ALKPHOS 79  BILITOT 1.1  PROT 7.8  ALBUMIN 4.4   No results for input(s): "LIPASE", "AMYLASE" in the last 168 hours. No results for input(s): "AMMONIA" in the last 168 hours. CBC: Recent Labs  Lab 11/09/23 1556  WBC 6.6  NEUTROABS 4.6  HGB 13.0  HCT 38.2*  MCV 89.7  PLT 247   Cardiac Enzymes: No results for input(s): "CKTOTAL", "CKMB", "CKMBINDEX", "TROPONINI" in the last 168 hours.  BNP (last 3 results) No results for input(s): "BNP" in the last 8760 hours.  ProBNP (last 3 results) No results for input(s): "PROBNP" in the last 8760 hours.  CBG: Recent Labs  Lab 11/09/23 2301  GLUCAP 193*    Radiological Exams on Admission: CT VENOGRAM ABD/PEL  Result Date: 11/09/2023 CLINICAL DATA:  Leg deep vein thrombosis (DVT) suspected EXAM: CT VENOGRAM ABDOMEN AND PELVIS TECHNIQUE: Venographic phase images of the abdomen and pelvis were obtained following the administration of intravenous contrast. Multiplanar reformats and maximum intensity projections were generated. RADIATION DOSE REDUCTION: This exam was performed according to the departmental dose-optimization program which includes automated exposure control, adjustment of the mA and/or kV according to patient size and/or use of iterative reconstruction technique. CONTRAST:  OMNIPAQUE IOHEXOL 350 MG/ML SOLN COMPARISON:  None Available. FINDINGS: Lung bases: No acute  findings. Hepatobiliary: Gallstones layering within the gallbladder. Diffuse low-density throughout the liver suggesting fatty infiltration. No  focal hepatic abnormality. Pancreas: No focal abnormality or ductal dilatation. Spleen: No focal abnormality.  Normal size. Urinary tract: No adrenal abnormality. No focal renal abnormality. No stones or hydronephrosis. Urinary bladder is unremarkable. Stomach/bowel: Stomach, large and small bowel grossly unremarkable. Vascular/lymphatic: Aortic atherosclerosis. No evidence of aneurysm or adenopathy. Filling defect noted within the left external and internal iliac veins and left common femoral veins extending into the superficial and deep femoral veins. Reproductive: No visible focal abnormality. Other: No free fluid or free air. Musculoskeletal: No acute bony abnormality. IMPRESSION: Left deep venous thrombosis involving the left internal and external iliac veins, left common femoral vein, and visualized superficial and deep femoral veins in the upper left thigh. Cholelithiasis. Hepatic steatosis Aortic atherosclerosis. Electronically Signed   By: Charlett Nose M.D.   On: 11/09/2023 19:26   CT Angio Chest PE W and/or Wo Contrast  Result Date: 11/09/2023 CLINICAL DATA:  Left leg DVT. Pulmonary embolism (PE) suspected, high prob EXAM: CT ANGIOGRAPHY CHEST WITH CONTRAST TECHNIQUE: Multidetector CT imaging of the chest was performed using the standard protocol during bolus administration of intravenous contrast. Multiplanar CT image reconstructions and MIPs were obtained to evaluate the vascular anatomy. RADIATION DOSE REDUCTION: This exam was performed according to the departmental dose-optimization program which includes automated exposure control, adjustment of the mA and/or kV according to patient size and/or use of iterative reconstruction technique. CONTRAST:  OMNIPAQUE IOHEXOL 350 MG/ML SOLN COMPARISON:  None Available. FINDINGS: Cardiovascular: No filling  defects in the pulmonary arteries to suggest pulmonary emboli. Heart is normal size. Aorta is normal caliber. Pacer wires in the right heart. Scattered coronary artery and aortic calcifications. Mediastinum/Nodes: No mediastinal, hilar, or axillary adenopathy. Trachea and esophagus are unremarkable. Prior thyroidectomy. Lungs/Pleura: Lungs are clear. No focal airspace opacities or suspicious nodules. No effusions. Upper Abdomen: Layering stones within the gallbladder. No acute findings. Musculoskeletal: Chest wall soft tissues are unremarkable. No acute bony abnormality. Review of the MIP images confirms the above findings. IMPRESSION: No evidence of pulmonary embolus. No acute cardiopulmonary disease. Scattered coronary artery calcifications. Aortic Atherosclerosis (ICD10-I70.0). Cholelithiasis. Electronically Signed   By: Charlett Nose M.D.   On: 11/09/2023 19:15   US Venous Img Lower Unilateral Left  Result Date: 11/09/2023 CLINICAL DATA:  Leg swelling EXAM: Left LOWER EXTREMITY VENOUS DOPPLER ULTRASOUND TECHNIQUE: Gray-scale sonography with compression, as well as color and duplex ultrasound, were performed to evaluate the deep venous system(s) from the level of the common femoral vein through the popliteal and proximal calf veins. COMPARISON:  None Available. FINDINGS: VENOUS Diffusely including the common femoral, femoral, popliteal vein there is poor compression and flow on color and spectral Doppler involving the vasculature consistent with the extensive DVT. Preserved appearance of the posterior tibial vein. OTHER None. Limitations: none Critical Value/emergent results were called by telephone at the time of interpretation on 11/09/2023 at 5:00 pm to provider Tanda Rockers , who verbally acknowledged these results. IMPRESSION: Extensive left lower extremity DVT Electronically Signed   By: Karen Kays M.D.   On: 11/09/2023 17:09    EKG: I independently viewed the EKG done and my findings are as  followed: Sinus rhythm rate of 74.  Nonspecific ST-T changes.  QTc 543.  Assessment/Plan Present on Admission:  Acute deep vein thrombosis (DVT) of left lower extremity (HCC)  Principal Problem:   Acute deep vein thrombosis (DVT) of left lower extremity (HCC)  Extensive acute left lower extremity DVT,  POA Vascular surgery consulted Continue heparin drip As needed  analgesics Elevated lower extremity  Coronary artery disease Chronic HFpEF Denies any anginal symptoms Resume home regimen  History of thyroid cancer status post thyroidectomy Prior to admission on levothyroxine, continue  Obesity BMI 31 Recommend weight loss outpatient with regular physical activity and healthy diet.   Time: 75 minutes.    DVT prophylaxis: Heparin drip  Code Status: Full code  Family Communication: None at bedside.  Disposition Plan: Admitted to telemetry medical unit  Consults called: Vascular surgery  Admission status: Observation status   Status is: Observation    Darlin Drop MD [facility] Pager [phone]  If 7PM-7AM, please contact night-coverage [email or web address] Password [facility]  11/09/2023, 11:06 PM

## 2023-11-09 NOTE — ED Provider Notes (Addendum)
[place] [facility] AT [facility] Provider Note   CSN: [id number] Arrival date & time: 11/09/23  1529     History  Chief Complaint  Patient presents with   Leg Swelling    Mike Jimenez is a 72 y.o. male with a past medical history significant for diabetes, hypothyroidism, asthma, thyroid cancer, CHF, CAD, hypertension, and history of gout who presents to the ED due to left lower extremity edema x 9 days.  Patient states edema originally started in his lower leg and has gone all the way up to his left hip.  Denies chest pain and shortness of breath.  No injury to left lower extremity.  No history of blood clots.  Denies fever or chills.  Was sent by PCP to rule out DVT.  History obtained from patient and past medical records. No interpreter used during encounter.       Home Medications Prior to Admission medications   Medication Sig Start Date End Date Taking? Authorizing Provider  ACCU-CHEK GUIDE test strip TEST BLOOD SUGAR ONCE DAILY 06/19/21   [provider]  acetaminophen (TYLENOL) 500 MG tablet Take 1,000 mg by mouth every 6 (six) hours as needed (pain).    [provider]  allopurinol (ZYLOPRIM) 100 MG tablet Take 1 tablet (100 mg total) by mouth daily. 09/12/23   Wanda Plump, MD  Apple Cider Vinegar 188 MG CAPS Take by mouth daily.    [provider]  aspirin EC 81 MG tablet Take 1 tablet (81 mg total) by mouth daily. 08/29/14   Dunn, Tacey Ruiz, PA-C  atorvastatin (LIPITOR) 40 MG tablet Take 1 tablet (40 mg total) by mouth daily. 10/11/22   Wanda Plump, MD  Blood Glucose Monitoring Suppl (ACCU-CHEK GUIDE) w/Device KIT TEST BLOOD SUGAR ONCE DAILY 06/19/21   [provider]  carvedilol (COREG) 12.5 MG tablet Take 1 tablet (12.5 mg total) by mouth daily. 09/12/23   Wanda Plump, MD  empagliflozin (JARDIANCE) 10 MG TABS tablet Take 1 tablet (10 mg total) by mouth daily before breakfast. Patient not taking: Reported on  05/09/2023 10/11/22   Wanda Plump, MD  furosemide (LASIX) 20 MG tablet Take 1 tablet (20 mg total) by mouth daily. 09/12/23   Wanda Plump, MD  levothyroxine (SYNTHROID) 112 MCG tablet Take 2 tablets (224 mcg total) by mouth daily before breakfast. 11/24/20   Wanda Plump, MD  losartan (COZAAR) 50 MG tablet Take 1 tablet (50 mg total) by mouth daily. 08/22/23   Wanda Plump, MD  MAGNESIUM PO Take 1 tablet by mouth daily.    [provider]  metFORMIN (GLUCOPHAGE) 1000 MG tablet Take 1 tablet (1,000 mg total) by mouth 2 (two) times daily with a meal. 10/11/22   Wanda Plump, MD  Multiple Vitamin (MULTIVITAMIN WITH MINERALS) TABS tablet Take 1 tablet by mouth daily.    [provider]  OVER THE COUNTER MEDICATION Apply 1 application topically at bedtime. CBD Cream for neuropathy in feet    [provider]  potassium chloride (KLOR-CON M10) 10 MEQ tablet Take 1 tablet (10 mEq total) by mouth daily. 10/11/22   Wanda Plump, MD  tadalafil (CIALIS) 20 MG tablet Take 1 tablet (20 mg total) by mouth daily as needed for erectile dysfunction. 04/11/17   Wanda Plump, MD  vitamin E 400 UNIT capsule Take 400 Units by mouth daily.    [provider]      Allergies  Bee venom, Amiodarone, and Ramipril    Review of Systems   Review of Systems  Constitutional:  Negative for chills and fever.  Respiratory:  Negative for shortness of breath.   Cardiovascular:  Positive for leg swelling. Negative for chest pain.    Physical Exam Updated Vital Signs BP (!) 138/91   Pulse 68   Temp 98.7 F (37.1 C) (Oral)   Resp 15   Ht 6\' 2"  (1.88 m)   Wt 110.8 kg   SpO2 98%   BMI 31.36 kg/m  Physical Exam Vitals and nursing note reviewed.  Constitutional:      General: He is not in acute distress.    Appearance: He is not ill-appearing.  HENT:     Head: Normocephalic.  Eyes:     Pupils: Pupils are equal, round, and reactive to light.  Cardiovascular:     Rate and Rhythm: Normal  rate and regular rhythm.     Pulses: Normal pulses.     Heart sounds: Normal heart sounds. No murmur heard.    No friction rub. No gallop.  Pulmonary:     Effort: Pulmonary effort is normal.     Breath sounds: Normal breath sounds.  Abdominal:     General: Abdomen is flat. There is no distension.     Palpations: Abdomen is soft.     Tenderness: There is no abdominal tenderness. There is no guarding or rebound.  Musculoskeletal:        General: Normal range of motion.     Cervical back: Neck supple.     Comments: 2+ pitting edema throughout entire LLE. Pedal pulses palpable  Skin:    General: Skin is warm and dry.  Neurological:     General: No focal deficit present.     Mental Status: He is alert.  Psychiatric:        Mood and Affect: Mood normal.        Behavior: Behavior normal.     ED Results / Procedures / Treatments   Labs (all labs ordered are listed, but only abnormal results are displayed) Labs Reviewed  CBC WITH DIFFERENTIAL/PLATELET - Abnormal; Notable for the following components:      Result Value   HCT 38.2 (*)    All other components within normal limits  COMPREHENSIVE METABOLIC PANEL - Abnormal; Notable for the following components:   Glucose, Bld 207 (*)    BUN 24 (*)    All other components within normal limits  HEPARIN LEVEL (UNFRACTIONATED)  HEPARIN LEVEL (UNFRACTIONATED)  TROPONIN I (HIGH SENSITIVITY)    EKG None  Radiology CT VENOGRAM ABD/PEL  Result Date: 11/09/2023 CLINICAL DATA:  Leg deep vein thrombosis (DVT) suspected EXAM: CT VENOGRAM ABDOMEN AND PELVIS TECHNIQUE: Venographic phase images of the abdomen and pelvis were obtained following the administration of intravenous contrast. Multiplanar reformats and maximum intensity projections were generated. RADIATION DOSE REDUCTION: This exam was performed according to the departmental dose-optimization program which includes automated exposure control, adjustment of the mA and/or kV according to  patient size and/or use of iterative reconstruction technique. CONTRAST:  OMNIPAQUE IOHEXOL 350 MG/ML SOLN COMPARISON:  None Available. FINDINGS: Lung bases: No acute findings. Hepatobiliary: Gallstones layering within the gallbladder. Diffuse low-density throughout the liver suggesting fatty infiltration. No focal hepatic abnormality. Pancreas: No focal abnormality or ductal dilatation. Spleen: No focal abnormality.  Normal size. Urinary tract: No adrenal abnormality. No focal renal abnormality. No stones or hydronephrosis. Urinary bladder is unremarkable. Stomach/bowel: Stomach, large and small  bowel grossly unremarkable. Vascular/lymphatic: Aortic atherosclerosis. No evidence of aneurysm or adenopathy. Filling defect noted within the left external and internal iliac veins and left common femoral veins extending into the superficial and deep femoral veins. Reproductive: No visible focal abnormality. Other: No free fluid or free air. Musculoskeletal: No acute bony abnormality. IMPRESSION: Left deep venous thrombosis involving the left internal and external iliac veins, left common femoral vein, and visualized superficial and deep femoral veins in the upper left thigh. Cholelithiasis. Hepatic steatosis Aortic atherosclerosis. Electronically Signed   By: Charlett Nose M.D.   On: 11/09/2023 19:26   CT Angio Chest PE W and/or Wo Contrast  Result Date: 11/09/2023 CLINICAL DATA:  Left leg DVT. Pulmonary embolism (PE) suspected, high prob EXAM: CT ANGIOGRAPHY CHEST WITH CONTRAST TECHNIQUE: Multidetector CT imaging of the chest was performed using the standard protocol during bolus administration of intravenous contrast. Multiplanar CT image reconstructions and MIPs were obtained to evaluate the vascular anatomy. RADIATION DOSE REDUCTION: This exam was performed according to the departmental dose-optimization program which includes automated exposure control, adjustment of the mA and/or kV according to patient  size and/or use of iterative reconstruction technique. CONTRAST:  OMNIPAQUE IOHEXOL 350 MG/ML SOLN COMPARISON:  None Available. FINDINGS: Cardiovascular: No filling defects in the pulmonary arteries to suggest pulmonary emboli. Heart is normal size. Aorta is normal caliber. Pacer wires in the right heart. Scattered coronary artery and aortic calcifications. Mediastinum/Nodes: No mediastinal, hilar, or axillary adenopathy. Trachea and esophagus are unremarkable. Prior thyroidectomy. Lungs/Pleura: Lungs are clear. No focal airspace opacities or suspicious nodules. No effusions. Upper Abdomen: Layering stones within the gallbladder. No acute findings. Musculoskeletal: Chest wall soft tissues are unremarkable. No acute bony abnormality. Review of the MIP images confirms the above findings. IMPRESSION: No evidence of pulmonary embolus. No acute cardiopulmonary disease. Scattered coronary artery calcifications. Aortic Atherosclerosis (ICD10-I70.0). Cholelithiasis. Electronically Signed   By: Charlett Nose M.D.   On: 11/09/2023 19:15   US Venous Img Lower Unilateral Left  Result Date: 11/09/2023 CLINICAL DATA:  Leg swelling EXAM: Left LOWER EXTREMITY VENOUS DOPPLER ULTRASOUND TECHNIQUE: Gray-scale sonography with compression, as well as color and duplex ultrasound, were performed to evaluate the deep venous system(s) from the level of the common femoral vein through the popliteal and proximal calf veins. COMPARISON:  None Available. FINDINGS: VENOUS Diffusely including the common femoral, femoral, popliteal vein there is poor compression and flow on color and spectral Doppler involving the vasculature consistent with the extensive DVT. Preserved appearance of the posterior tibial vein. OTHER None. Limitations: none Critical Value/emergent results were called by telephone at the time of interpretation on 11/09/2023 at 5:00 pm to provider Tanda Rockers , who verbally acknowledged these results. IMPRESSION: Extensive  left lower extremity DVT Electronically Signed   By: Karen Kays M.D.   On: 11/09/2023 17:09    Procedures .Critical Care  Performed by: Mannie Stabile, PA-C Authorized by: Mannie Stabile, PA-C   Critical care provider statement:    Critical care time (minutes):  30   Critical care was necessary to treat or prevent imminent or life-threatening deterioration of the following conditions:  Circulatory failure   Critical care was time spent personally by me on the following activities:  Development of treatment plan with patient or surrogate, discussions with consultants, evaluation of patient's response to treatment, examination of patient, ordering and review of laboratory studies, ordering and review of radiographic studies, ordering and performing treatments and interventions, pulse oximetry, re-evaluation of patient's condition and  review of old charts   I assumed direction of critical care for this patient from another provider in my specialty: no     Care discussed with: admitting provider       Medications Ordered in ED Medications  heparin ADULT infusion 100 units/mL (25000 units/264mL) (1,750 Units/hr Intravenous New Bag/Given 11/09/23 1807)  iohexol (OMNIPAQUE) 350 MG/ML injection 150 mL (150 mLs Intravenous Contrast Given 11/09/23 1727)  heparin bolus via infusion 6,000 Units (6,000 Units Intravenous Bolus from Bag 11/09/23 1807)    ED Course/ Medical Decision Making/ A&P                                 Medical Decision Making Amount and/or Complexity of Data Reviewed Independent Historian: spouse External Data Reviewed: notes. Labs: ordered. Decision-making details documented in ED Course. Radiology: ordered and independent interpretation performed. Decision-making details documented in ED Course. ECG/medicine tests: ordered and independent interpretation performed. Decision-making details documented in ED Course.  Risk Prescription drug management. Decision  regarding hospitalization.   This patient presents to the ED for concern of LLE edema, this involves an extensive number of treatment options, and is a complaint that carries with it a high risk of complications and morbidity.  The differential diagnosis includes DVT, cellulitis, CHF, bony fracture, etc  72 year old male presents to the ED due to left lower extremity edema x 9 days.  No history of blood clots.  Denies chest pain and shortness of breath.  No trauma.  Denies recent surgeries or long immobilizations.  Upon arrival patient afebrile, not tachycardic or hypoxic.  Patient in no acute distress.  2+ pitting edema to entire left lower extremity.  Pedal pulses palpable.  Routine labs ordered.  Ultrasound rule out DVT.  CBC reassuring.  No leukocytosis.  Normal hemoglobin.  CMP with hyperglycemia at 207.  No anion gap.  Low suspicion for DKA.  Normal creatinine.  No major electrolyte derangements.  Ultrasound with extensive left lower extremity DVT.  Heparin started. CTA chest and abdominal CT venogram.    CTA negative for PE.  CT venogram demonstrates left DVT involving internal and external iliacs, common femoral and deep femoral.  Patient will require admission for extensive DVT.  7:53 PM Discussed with Dr. Myra Gianotti with vascular who will have his team see the patient in the AM.  8:05 PM Discussed with Dr. Antionette Char with [facility] who agrees to admit patient  Co morbidities that complicate the patient evaluation  DM, HTN, HLD Cardiac Monitoring: / EKG:  The patient was maintained on a cardiac monitor.  I personally viewed and interpreted the cardiac monitored which showed an underlying rhythm of: NSR  Social Determinants of Health:  Has PCP on file  Test / Admission - Considered:  Patient will require admission for extensive DVT        Final Clinical Impression(s) / ED Diagnoses Final diagnoses:  Acute deep vein thrombosis (DVT) of proximal vein of left lower extremity [facility])     Rx / DC Orders ED Discharge Orders     None         Mannie Stabile, PA-C 11/09/23 2009    Mannie Stabile, [place] 11/09/23 2031    Sloan Leiter, DO 11/12/23 [phone]

## 2023-11-09 NOTE — H&P (View-Only) (Signed)
 [facility] [place]  Patient name: Mike Jimenez MRN: [id number] DOB: 1951-06-29 Sex: male   REQUESTING PROVIDER:   ER   REASON FOR CONSULT:    Left leg DVT  HISTORY OF PRESENT ILLNESS:   Mike Jimenez is a 73 y.o. male, who presented to med [facility] today with a 1 week history of left leg swelling beginning at the hip going all the way down his leg.  He feels pressure when he walks.  He denies any chest pain.  He denies any prolonged inactivity.  He did have a left knee injection on November 19 for knee pain.  He denies any prior history of DVT.   The patient has a history of diabetes.  He also suffers from coronary artery disease and congestive heart failure.  He is medically managed for hypertension with an ARB.  He takes a statin for hypercholesterolemia.  PAST MEDICAL HISTORY    Past Medical History:  Diagnosis Date   Anemia    Arthritis    "knees" (09/05/2014)   Automatic implantable cardioverter-defibrillator in situ    CAD (coronary artery disease)    a. Mild nonobstructive CAD by cath 08/2014.   CHF (congestive heart failure) (HCC)    Childhood asthma    Gastric ulcer 2011   GERD (gastroesophageal reflux disease)    History of gout    Hyperlipidemia    Hypertension    LBBB (left bundle branch block)    NICM (nonischemic cardiomyopathy) (HCC)    a. Dx 2011 - presented with sustained VT. Normal coronaries at that time, EF 10-15% by cath and 20-25% by echo. b. s/p biventricular ICD 2011. c. CP 08/2014: mild nonobstructive CAD.   Obesity    Postsurgical hypothyroidism    Thyroid cancer (HCC)    a. 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 Dr Gerrit Friends. s/p thyroidectomy 2013.   Torn meniscus    Type II diabetes mellitus (HCC)    Ventricular tachycardia (HCC)    a. Sustained VT 2011 s/p MDT [id number] Concert BiV ICD, ser# [id number] H.     FAMILY HISTORY   Family History  Problem Relation  Age of Onset   Atrial fibrillation Father        diseased at ag 15   Hypertension Father        alive @ 105.   Hypothyroidism Mother    Hypertension Mother        alive @ 95.   Diabetes Maternal Grandfather    Diabetes Maternal Aunt    Colon cancer Maternal Aunt    Colon cancer Maternal Aunt    Lung cancer Paternal Uncle    Lung cancer Maternal Uncle    Asthma Sister    Hypothyroidism Daughter    Prostate cancer Neg Hx    CAD Neg Hx     SOCIAL HISTORY:   Social History   Socioeconomic History   Marital status: Married    Spouse name: Not on file   Number of children: 2   Years of education: Not on file   Highest education level: Not on file  Occupational History   Occupation: [employer]   Occupation: to retire 2023  Tobacco Use   Smoking status: Former    Current packs/day: 0.00    Average packs/day: 2.5 packs/day for 2.0 years (5.0 ttl pk-yrs)    Types: Cigarettes    Start date: 11/29/1968    Quit date: 11/29/1970    Years since quitting: 52.9  Smokeless tobacco: Former    Types: Engineer, drilling   Vaping status: Never Used  Substance and Sexual Activity   Alcohol use: Yes    Comment: drinks rarely   Drug use: No   Sexual activity: Yes  Other Topics Concern   Not on file  Social History Narrative   Live w/ wife , daughter [name] lives in [place]   9 dogs, fish pond.   Job is  physical.   Does not routinely exercise.   Social Determinants of Health   Financial Resource Strain: Low Risk  (01/28/2022)   Overall Financial Resource Strain (CARDIA)    Difficulty of Paying Living Expenses: Not hard at all  Food Insecurity: No Food Insecurity (02/01/2023)   Hunger Vital Sign    Worried About Running Out of Food in the Last Year: Never true    Ran Out of Food in the Last Year: Never true  Transportation Needs: No Transportation Needs (02/01/2023)   PRAPARE - Administrator, Civil Service (Medical): No    Lack of Transportation (Non-Medical): No  Physical  Activity: Sufficiently Active (02/01/2023)   Exercise Vital Sign    Days of Exercise per Week: 3 days    Minutes of Exercise per Session: 50 min  Stress: No Stress Concern Present (01/28/2022)   [employer] of Occupational Health - Occupational Stress Questionnaire    Feeling of Stress : Not at all  Social Connections: Moderately Isolated (01/28/2022)   Social Connection and Isolation Panel [NHANES]    Frequency of Communication with Friends and Family: Twice a week    Frequency of Social Gatherings with Friends and Family: Three times a week    Attends Religious Services: Never    Active Member of Clubs or Organizations: No    Attends Banker Meetings: Never    Marital Status: Married  Catering manager Violence: Not At Risk (02/01/2023)   Humiliation, Afraid, Rape, and Kick questionnaire    Fear of Current or Ex-Partner: No    Emotionally Abused: No    Physically Abused: No    Sexually Abused: No    ALLERGIES:    Allergies  Allergen Reactions   Bee Venom Swelling   Amiodarone Other (See Comments)   Ramipril Cough    CURRENT MEDICATIONS:    Current Facility-Administered Medications  Medication Dose Route Frequency Provider Last Rate Last Admin   heparin ADULT infusion 100 units/mL (25000 units/273mL)  1,750 Units/hr Intravenous Continuous Tanda Rockers A, DO 17.5 mL/hr at 11/09/23 1807 1,750 Units/hr at 11/09/23 1807   Current Outpatient Medications  Medication Sig Dispense Refill   ACCU-CHEK GUIDE test strip TEST BLOOD SUGAR ONCE DAILY     acetaminophen (TYLENOL) 500 MG tablet Take 1,000 mg by mouth every 6 (six) hours as needed (pain).     allopurinol (ZYLOPRIM) 100 MG tablet Take 1 tablet (100 mg total) by mouth daily. 90 tablet 1   Apple Cider Vinegar 188 MG CAPS Take by mouth daily.     aspirin EC 81 MG tablet Take 1 tablet (81 mg total) by mouth daily.     atorvastatin (LIPITOR) 40 MG tablet Take 1 tablet (40 mg total) by mouth daily. 90 tablet 2    Blood Glucose Monitoring Suppl (ACCU-CHEK GUIDE) w/Device KIT TEST BLOOD SUGAR ONCE DAILY     carvedilol (COREG) 12.5 MG tablet Take 1 tablet (12.5 mg total) by mouth daily. 90 tablet 1   empagliflozin (JARDIANCE) 10 MG TABS tablet Take 1  tablet (10 mg total) by mouth daily before breakfast. (Patient not taking: Reported on 05/09/2023) 90 tablet 2   furosemide (LASIX) 20 MG tablet Take 1 tablet (20 mg total) by mouth daily. 90 tablet 1   levothyroxine (SYNTHROID) 112 MCG tablet Take 2 tablets (224 mcg total) by mouth daily before breakfast.     losartan (COZAAR) 50 MG tablet Take 1 tablet (50 mg total) by mouth daily. 90 tablet 1   MAGNESIUM PO Take 1 tablet by mouth daily.     metFORMIN (GLUCOPHAGE) 1000 MG tablet Take 1 tablet (1,000 mg total) by mouth 2 (two) times daily with a meal. 180 tablet 2   Multiple Vitamin (MULTIVITAMIN WITH MINERALS) TABS tablet Take 1 tablet by mouth daily.     OVER THE COUNTER MEDICATION Apply 1 application topically at bedtime. CBD Cream for neuropathy in feet     potassium chloride (KLOR-CON M10) 10 MEQ tablet Take 1 tablet (10 mEq total) by mouth daily. 90 tablet 2   tadalafil (CIALIS) 20 MG tablet Take 1 tablet (20 mg total) by mouth daily as needed for erectile dysfunction. 10 tablet 5   vitamin E 400 UNIT capsule Take 400 Units by mouth daily.      REVIEW OF SYSTEMS:   [X]  denotes positive finding, [ ]  denotes negative finding Cardiac  Comments:  Chest pain or chest pressure: ***   Shortness of breath upon exertion:    Short of breath when lying flat:    Irregular heart rhythm:        Vascular    Pain in calf, thigh, or hip brought on by ambulation:    Pain in feet at night that wakes you up from your sleep:     Blood clot in your veins:    Leg swelling:         Pulmonary    Oxygen at home:    Productive cough:     Wheezing:         Neurologic    Sudden weakness in arms or legs:     Sudden numbness in arms or legs:     Sudden onset of  difficulty speaking or slurred speech:    Temporary loss of vision in one eye:     Problems with dizziness:         Gastrointestinal    Blood in stool:      Vomited blood:         Genitourinary    Burning when urinating:     Blood in urine:        Psychiatric    Major depression:         Hematologic    Bleeding problems:    Problems with blood clotting too easily:        Skin    Rashes or ulcers:        Constitutional    Fever or chills:     PHYSICAL EXAM:   Vitals:   11/09/23 1900 11/09/23 1941 11/09/23 2000 11/09/23 2135  BP: (!) 153/82  (!) 138/91 (!) 152/89  Pulse: 65  68 71  Resp: 17  15 16   Temp:  98.7 F (37.1 C)  98.2 F (36.8 C)  TempSrc:  Oral  Oral  SpO2: 94%  98% 96%  Weight:      Height:        GENERAL: The patient is a well-nourished male, in no acute distress. The vital signs are documented above. CARDIAC: There is a  regular rate and rhythm.  VASCULAR: *** PULMONARY: Nonlabored respirations ABDOMEN: Soft and non-tender with normal pitched bowel sounds.  MUSCULOSKELETAL: There are no major deformities or cyanosis. NEUROLOGIC: No focal weakness or paresthesias are detected. SKIN: There are no ulcers or rashes noted. PSYCHIATRIC: The patient has a normal affect.  STUDIES:   I have reviewed the following studies: CT venogram: Left deep venous thrombosis involving the left internal and external iliac veins, left common femoral vein, and visualized superficial and deep femoral veins in the upper left thigh.   Cholelithiasis.   Hepatic steatosis   Aortic atherosclerosis.   Ultrasound: VENOUS   Diffusely including the common femoral, femoral, popliteal vein there is poor compression and flow on color and spectral Doppler involving the vasculature consistent with the extensive DVT. Preserved appearance of the posterior tibial vein.  ASSESSMENT and PLAN   ***   Charlena Cross, MD, FACS [facility]  [facility] [phone] Pager [phone]

## 2023-11-09 NOTE — ED Triage Notes (Signed)
Pt sent from PCP for r/o DVT to left leg States leg pain and swelling that started approx 9 days ago

## 2023-11-10 ENCOUNTER — Other Ambulatory Visit (HOSPITAL_COMMUNITY): Payer: Self-pay

## 2023-11-10 ENCOUNTER — Telehealth (HOSPITAL_COMMUNITY): Payer: Self-pay | Admitting: Pharmacy Technician

## 2023-11-10 DIAGNOSIS — I824Y2 Acute embolism and thrombosis of unspecified deep veins of left proximal lower extremity: Secondary | ICD-10-CM | POA: Diagnosis not present

## 2023-11-10 LAB — CBC
HCT: 35.3 % — ABNORMAL LOW (ref 39.0–52.0)
Hemoglobin: 12.3 g/dL — ABNORMAL LOW (ref 13.0–17.0)
MCH: 31.1 pg (ref 26.0–34.0)
MCHC: 34.8 g/dL (ref 30.0–36.0)
MCV: 89.1 fL (ref 80.0–100.0)
Platelets: 213 10*3/uL (ref 150–400)
RBC: 3.96 MIL/uL — ABNORMAL LOW (ref 4.22–5.81)
RDW: 13.5 % (ref 11.5–15.5)
WBC: 6.3 10*3/uL (ref 4.0–10.5)
nRBC: 0 % (ref 0.0–0.2)

## 2023-11-10 LAB — MAGNESIUM: Magnesium: 1.8 mg/dL (ref 1.7–2.4)

## 2023-11-10 LAB — HEPARIN LEVEL (UNFRACTIONATED)
Heparin Unfractionated: 0.56 [IU]/mL (ref 0.30–0.70)
Heparin Unfractionated: 0.6 [IU]/mL (ref 0.30–0.70)

## 2023-11-10 LAB — COMPREHENSIVE METABOLIC PANEL
ALT: 18 U/L (ref 0–44)
AST: 17 U/L (ref 15–41)
Albumin: 3.9 g/dL (ref 3.5–5.0)
Alkaline Phosphatase: 73 U/L (ref 38–126)
Anion gap: 13 (ref 5–15)
BUN: 22 mg/dL (ref 8–23)
CO2: 24 mmol/L (ref 22–32)
Calcium: 9.3 mg/dL (ref 8.9–10.3)
Chloride: 101 mmol/L (ref 98–111)
Creatinine, Ser: 1.15 mg/dL (ref 0.61–1.24)
GFR, Estimated: >60 mL/min (ref >60)
Glucose, Bld: 169 mg/dL — ABNORMAL HIGH (ref 70–99)
Potassium: 3.8 mmol/L (ref 3.5–5.1)
Sodium: 138 mmol/L (ref 135–145)
Total Bilirubin: 1.1 mg/dL (ref <1.2)
Total Protein: 6.6 g/dL (ref 6.5–8.1)

## 2023-11-10 LAB — PHOSPHORUS: Phosphorus: 3.4 mg/dL (ref 2.5–4.6)

## 2023-11-10 MED ORDER — ASPIRIN 81 MG PO TBEC
81.0000 mg | DELAYED_RELEASE_TABLET | Freq: Every day | ORAL | Status: DC
  Administered 2023-11-10 – 2023-11-13 (×4): 81 mg via ORAL
  Filled 2023-11-10 (×4): qty 1

## 2023-11-10 MED ORDER — ATORVASTATIN CALCIUM 40 MG PO TABS
40.0000 mg | ORAL_TABLET | Freq: Every day | ORAL | Status: DC
Start: 2023-11-10 — End: 2023-11-13
  Administered 2023-11-10 – 2023-11-13 (×4): 40 mg via ORAL
  Filled 2023-11-10 (×4): qty 1

## 2023-11-10 MED ORDER — LOSARTAN POTASSIUM 50 MG PO TABS
50.0000 mg | ORAL_TABLET | Freq: Every day | ORAL | Status: DC
  Administered 2023-11-10 – 2023-11-13 (×4): 50 mg via ORAL
  Filled 2023-11-10 (×4): qty 1

## 2023-11-10 MED ORDER — CARVEDILOL 3.125 MG PO TABS
3.1250 mg | ORAL_TABLET | Freq: Every day | ORAL | Status: DC
Start: 2023-11-10 — End: 2023-11-12
  Administered 2023-11-10 – 2023-11-11 (×2): 3.125 mg via ORAL
  Filled 2023-11-10 (×2): qty 1

## 2023-11-10 MED ORDER — LEVOTHYROXINE SODIUM 112 MCG PO TABS
175.0000 ug | ORAL_TABLET | Freq: Every day | ORAL | Status: DC
  Administered 2023-11-10 – 2023-11-13 (×3): 168 ug via ORAL
  Filled 2023-11-10: qty 2
  Filled 2023-11-10: qty 1.5
  Filled 2023-11-10 (×2): qty 2

## 2023-11-10 NOTE — Telephone Encounter (Signed)
Pharmacy Patient Advocate Encounter  Insurance verification completed.    The patient is insured through [employer]/ [employer]. Patient has Medicare and is not eligible for a copay card, but may be able to apply for patient assistance, if available.    Ran test claim for Eliquis 5mg  and the current 30 day co-pay is 142.54.   This test claim was processed through [facility]- copay amounts may vary at other pharmacies due to pharmacy/plan contracts, or as the patient moves through the different stages of their insurance plan.

## 2023-11-10 NOTE — Progress Notes (Signed)
ANTICOAGULATION CONSULT NOTE   Pharmacy Consult for Heparin Indication:  VTE treatment  Allergies  Allergen Reactions   Bee Venom Swelling   Amiodarone Other (See Comments)   Ramipril Cough    Patient Measurements: Height: 6\' 2"  (188 cm) Weight: 110.8 kg (244 lb 4.3 oz) IBW/kg (Calculated) : 82.2 Heparin Dosing Weight: 105 kg  Vital Signs: Temp: 97.6 F (36.4 C) (12/12 0429) Temp Source: Oral (12/11 2252) BP: 144/73 (12/12 0429) Pulse Rate: 67 (12/12 0429)  Labs: Recent Labs    11/09/23 1556 11/10/23 0614  HGB 13.0 12.3*  HCT 38.2* 35.3*  PLT 247 213  HEPARINUNFRC  --  0.56  CREATININE 1.09  --   TROPONINIHS 9  --     Estimated Creatinine Clearance: 82.3 mL/min (by C-G formula based on SCr of 1.09 mg/dL).   Medical History: Past Medical History:  Diagnosis Date   Anemia    Arthritis    "knees" (09/05/2014)   Automatic implantable cardioverter-defibrillator in situ    CAD (coronary artery disease)    a. Mild nonobstructive CAD by cath 08/2014.   CHF (congestive heart failure) (HCC)    Childhood asthma    Gastric ulcer 2011   GERD (gastroesophageal reflux disease)    History of gout    Hyperlipidemia    Hypertension    LBBB (left bundle branch block)    NICM (nonischemic cardiomyopathy) (HCC)    a. Dx 2011 - presented with sustained VT. Normal coronaries at that time, EF 10-15% by cath and 20-25% by echo. b. s/p biventricular ICD 2011. c. CP 08/2014: mild nonobstructive CAD.   Obesity    Postsurgical hypothyroidism    Thyroid cancer (HCC)    a. 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 Dr Gerrit Friends. s/p thyroidectomy 2013.   Torn meniscus    Type II diabetes mellitus (HCC)    Ventricular tachycardia (HCC)    a. Sustained VT 2011 s/p MDT D274TRK Concert BiV ICD, ser# [id number] H.    Medications:  Medications Prior to Admission  Medication Sig Dispense Refill Last Dose/Taking   ACCU-CHEK GUIDE test strip TEST BLOOD SUGAR ONCE DAILY       acetaminophen (TYLENOL) 500 MG tablet Take 1,000 mg by mouth every 6 (six) hours as needed (pain).      allopurinol (ZYLOPRIM) 100 MG tablet Take 1 tablet (100 mg total) by mouth daily. 90 tablet 1    Apple Cider Vinegar 188 MG CAPS Take by mouth daily.      aspirin EC 81 MG tablet Take 1 tablet (81 mg total) by mouth daily.      atorvastatin (LIPITOR) 40 MG tablet Take 1 tablet (40 mg total) by mouth daily. 90 tablet 2    Blood Glucose Monitoring Suppl (ACCU-CHEK GUIDE) w/Device KIT TEST BLOOD SUGAR ONCE DAILY      carvedilol (COREG) 12.5 MG tablet Take 1 tablet (12.5 mg total) by mouth daily. 90 tablet 1    empagliflozin (JARDIANCE) 10 MG TABS tablet Take 1 tablet (10 mg total) by mouth daily before breakfast. (Patient not taking: Reported on 05/09/2023) 90 tablet 2    furosemide (LASIX) 20 MG tablet Take 1 tablet (20 mg total) by mouth daily. 90 tablet 1    levothyroxine (SYNTHROID) 112 MCG tablet Take 2 tablets (224 mcg total) by mouth daily before breakfast.      losartan (COZAAR) 50 MG tablet Take 1 tablet (50 mg total) by mouth daily. 90 tablet 1    MAGNESIUM PO Take 1 tablet  by mouth daily.      metFORMIN (GLUCOPHAGE) 1000 MG tablet Take 1 tablet (1,000 mg total) by mouth 2 (two) times daily with a meal. 180 tablet 2    Multiple Vitamin (MULTIVITAMIN WITH MINERALS) TABS tablet Take 1 tablet by mouth daily.      OVER THE COUNTER MEDICATION Apply 1 application topically at bedtime. CBD Cream for neuropathy in feet      potassium chloride (KLOR-CON M10) 10 MEQ tablet Take 1 tablet (10 mEq total) by mouth daily. 90 tablet 2    tadalafil (CIALIS) 20 MG tablet Take 1 tablet (20 mg total) by mouth daily as needed for erectile dysfunction. 10 tablet 5    vitamin E 400 UNIT capsule Take 400 Units by mouth daily.      Scheduled:   atorvastatin  40 mg Oral Daily   carvedilol  3.125 mg Oral Daily   levothyroxine  224 mcg Oral QAC breakfast   Infusions:   heparin 1,750 Units/hr (11/09/23 1807)    PRN: acetaminophen, HYDROmorphone (DILAUDID) injection, melatonin, oxyCODONE, polyethylene glycol, prochlorperazine  Assessment: 72 yom with a history of DM, hypothyroidism, asthma, HF, CAD, HTN, hx of gout. Patient is presenting with leg swelling x 9 days. Heparin per pharmacy consult placed for  vte treatment .  [place] w/ DVT LLE CTA PE negative for PE  Patient is not on anticoagulation prior to arrival.  Hgb 13; plt 247  12/12 AM update:  Heparin level therapeutic   Goal of Therapy:  Heparin level 0.3-0.7 units/ml Monitor platelets by anticoagulation protocol: Yes   Plan:  Cont heparin infusion at 1750 units/hr Check anti-Xa level in 6-8 hours and daily while on heparin Continue to monitor H&H and platelets  Abran Duke, PharmD, BCPS Clinical Pharmacist Phone: [phone]

## 2023-11-10 NOTE — Progress Notes (Signed)
ANTICOAGULATION CONSULT NOTE   Pharmacy Consult for Heparin Indication: LLE DVT   Allergies  Allergen Reactions   Bee Venom Swelling   Amiodarone Other (See Comments)   Ramipril Cough    Patient Measurements: Height: 6\' 2"  (188 cm) Weight: 110.8 kg (244 lb 4.3 oz) IBW/kg (Calculated) : 82.2 Heparin Dosing Weight: 105 kg  Vital Signs: Temp: 98 F (36.7 C) (12/12 0811) Temp Source: Oral (12/12 0811) BP: 152/81 (12/12 0812) Pulse Rate: 68 (12/12 0812)  Labs: Recent Labs    11/09/23 1556 11/10/23 0614 11/10/23 1328  HGB 13.0 12.3*  --   HCT 38.2* 35.3*  --   PLT 247 213  --   HEPARINUNFRC  --  0.56 0.60  CREATININE 1.09 1.15  --   TROPONINIHS 9  --   --     Estimated Creatinine Clearance: 78 mL/min (by C-G formula based on SCr of 1.15 mg/dL).   Medical History: Past Medical History:  Diagnosis Date   Anemia    Arthritis    "knees" (09/05/2014)   Automatic implantable cardioverter-defibrillator in situ    CAD (coronary artery disease)    a. Mild nonobstructive CAD by cath 08/2014.   CHF (congestive heart failure) (HCC)    Childhood asthma    Gastric ulcer 2011   GERD (gastroesophageal reflux disease)    History of gout    Hyperlipidemia    Hypertension    LBBB (left bundle branch block)    NICM (nonischemic cardiomyopathy) (HCC)    a. Dx 2011 - presented with sustained VT. Normal coronaries at that time, EF 10-15% by cath and 20-25% by echo. b. s/p biventricular ICD 2011. c. CP 08/2014: mild nonobstructive CAD.   Obesity    Postsurgical hypothyroidism    Thyroid cancer (HCC)    a. 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 Dr Gerrit Friends. s/p thyroidectomy 2013.   Torn meniscus    Type II diabetes mellitus (HCC)    Ventricular tachycardia (HCC)    a. Sustained VT 2011 s/p MDT D274TRK Concert BiV ICD, ser# [id number] H.      Assessment: 72 yo M admitted with new LLE DVT. No anticoagulation prior to admission. Pharmacy consulted for heparin.      Heparin level 0.6 is therapeutic on 1750 units/hr. Low bleeding risk.    Goal of Therapy:  Heparin level 0.3-0.7 units/ml Monitor platelets by anticoagulation protocol: Yes   Plan:  Continue heparin infusion at 1750 units/hr Monitor daily heparin level, CBC, signs/symptoms of bleeding  F/u long term anticoagulation plan   Alphia Moh, PharmD, BCPS, [facility] Clinical Pharmacist  Please check AMION for all [facility] Pharmacy phone numbers After 10:00 PM, call Main Pharmacy [phone]

## 2023-11-10 NOTE — Assessment & Plan Note (Signed)
Left leg swelling: 1 week history of left leg swelling as described above, strongly suspect DVT on clinical grounds. Given extent of the swelling I am recommending him to go to the ER for immediate attention.  If DVT is confirmed, a admission to hospital for observation could be entertained. Pt initially though swelling was gout, no evidence of clinical gout at this point.

## 2023-11-10 NOTE — Progress Notes (Signed)
PROGRESS NOTE  DEVONTEZ Jimenez  DOB: Aug 02, 1951  PCP: Wanda Plump, MD WCB:[id number]  DOA: 11/09/2023  LOS: 0 days  Hospital Day: 2  Brief narrative: Mike Jimenez is a 72 y.o. male with PMH significant for obesity, DM2, HTN, HLD, CAD, CHF, V. tach, nonischemic cardiomyopathy s/p BiV PPM, chronic anemia, gastric ulcer, neuropathy, history of thyroid cancer s/p thyroidectomy 2013. 12/11, patient was seen by his PCP for 1 week history of left leg swelling.  DVT was suspected and patient was sent to ED at [facility].  In the ED, CT venogram showed extensive left lower extremity DVT CT PE was negative for acute pulm embolism EDP discussed with vascular surgery Dr. Myra Gianotti Recommended to start IV heparin drip and admission to [facility] for possible need of thrombectomy.  Subjective: Patient was seen and examined this morning. Pleasant, elderly Caucasian male. Not in distress.  Has impressive left lower extremity swelling.  Not on supplemental oxygen Denies any recent travel or prolonged immobility  Assessment and plan: Extensive acute left lower extremity DVT Presented with left leg swelling.  Ultrasound duplex as above. CTPA negative for pulm embolism Currently on heparin drip Vascular surgery to see for possible thrombectomy   Chronic HFpEF Essential hypertension PTA meds- Coreg 3.125 mg twice daily, Lasix 20 mg daily, losartan 50 mg at bedtime Currently continued on Coreg.  Blood pressure running elevated.  Resume losartan as well.  Keep Lasix on hold for now.  CAD, HLD Denies any anginal symptoms PTA meds- aspirin 81 mg daily, Lipitor 40 mg daily Continue both   H/o thyroid cancer s/p thyroidectomy-2013 Continue Synthroid  Obesity  Body mass index is 31.36 kg/m. Patient has been advised to make an attempt to improve diet and exercise patterns to aid in weight loss.   Mobility: Need to encourage ambulation after thrombectomy  Goals of care   Code Status: Full  Code     DVT prophylaxis: Heparin drip   Antimicrobials: None Fluid: None Consultants: Vascular surgery Family Communication: None at bedside  Status: Observation Level of care:  Telemetry Medical   Patient is from: Home Needs to continue in-hospital care: Pending thrombectomy Anticipated d/c to: Pending clinical course      Diet:  Diet Order             Diet NPO time specified Except for: Sips with Meds  Diet effective midnight           Diet regular Room service appropriate? Yes; Fluid consistency: Thin  Diet effective now                   Scheduled Meds:  aspirin EC  81 mg Oral Daily   atorvastatin  40 mg Oral Daily   carvedilol  3.125 mg Oral Daily   levothyroxine  168 mcg Oral Q0600   losartan  50 mg Oral Daily    PRN meds: acetaminophen, HYDROmorphone (DILAUDID) injection, melatonin, oxyCODONE, polyethylene glycol, prochlorperazine   Infusions:   heparin 1,750 Units/hr (11/09/23 1807)    Antimicrobials: Anti-infectives (From admission, onward)    None       Objective: Vitals:   11/10/23 0811 11/10/23 0812  BP: (!) 160/84 (!) 152/81  Pulse: 68 68  Resp:    Temp: 98 F (36.7 C)   SpO2: 95% 95%    Intake/Output Summary (Last 24 hours) at 11/10/2023 1355 Last data filed at 11/10/2023 0100 Gross per 24 hour  Intake --  Output 350 ml  Net -350 ml   Filed Weights   11/09/23 1553  Weight: 110.8 kg   Weight change:  Body mass index is 31.36 kg/m.   Physical Exam: General exam: Pleasant, elderly Caucasian male.  Not in distress Skin: No rashes, lesions or ulcers. HEENT: Atraumatic, normocephalic, no obvious bleeding Lungs: Clear to auscultation bilaterally CVS: Regular rate and rhythm, no murmur GI/Abd soft, nontender, nondistended, bowel sound present CNS: Alert, awake, oriented x 3 Psychiatry: Mood appropriate Extremities: Left leg grossly swollen.  Right leg normal  Data Review: I have personally reviewed the laboratory  data and studies available.  F/u labs ordered Unresulted Labs (From admission, onward)     Start     Ordered   11/11/23 0500  CBC with Differential/Platelet  Daily,   R      11/10/23 1355   11/11/23 0500  Basic metabolic panel  Daily,   R      11/10/23 1355   11/10/23 1400  Heparin level (unfractionated)  Once-Timed,   TIMED        11/10/23 0701   11/10/23 0500  Heparin level (unfractionated)  Daily,   R      11/09/23 2338            Total time spent in review of labs and imaging, patient evaluation, formulation of plan, documentation and communication with family: 55 minutes  Signed, Lorin Glass, MD [facility] 11/10/2023

## 2023-11-10 NOTE — Care Management (Signed)
Transition of Care [facility]) - Inpatient Brief Assessment   Patient Details  Name: Mike Jimenez MRN: [id number] Date of Birth: 01/23/1951  Transition of Care [facility]) CM/SW Contact:    Lockie Pares, RN Phone Number: 11/10/2023, 12:28 PM   Clinical Narrative: 72 yo with unilateral lower extremity swelling, + for DVT . Not eligible for cpay card, Medicare- eligibility done by pharmacy with DOAC 142 dollars a month.  Lives at home with wife, no services or needs identified. The patient will be discussed in daily progressive rounds, if a need is identified, please place a TOC consult   Transition of Care Asessment: Insurance and Status: Insurance coverage has been reviewed Patient has primary care physician: Yes Home environment has been reviewed: Lives with spouse Prior level of function:: Independent Prior/Current Home Services: No current home services Social Drivers of Health Review: SDOH reviewed no interventions necessary Readmission risk has been reviewed: Yes Transition of care needs: no transition of care needs at this time

## 2023-11-11 ENCOUNTER — Encounter (HOSPITAL_COMMUNITY)
Admission: EM | Disposition: A | Discharge status: Home / Self Care | Payer: Self-pay | Source: Home / Self Care | Attending: Internal Medicine

## 2023-11-11 DIAGNOSIS — D62 Acute posthemorrhagic anemia: Secondary | ICD-10-CM | POA: Diagnosis not present

## 2023-11-11 DIAGNOSIS — Z7984 Long term (current) use of oral hypoglycemic drugs: Secondary | ICD-10-CM | POA: Diagnosis not present

## 2023-11-11 DIAGNOSIS — I82592 Chronic embolism and thrombosis of other specified deep vein of left lower extremity: Secondary | ICD-10-CM | POA: Diagnosis not present

## 2023-11-11 DIAGNOSIS — Z833 Family history of diabetes mellitus: Secondary | ICD-10-CM | POA: Diagnosis not present

## 2023-11-11 DIAGNOSIS — Z7982 Long term (current) use of aspirin: Secondary | ICD-10-CM | POA: Diagnosis not present

## 2023-11-11 DIAGNOSIS — Z8249 Family history of ischemic heart disease and other diseases of the circulatory system: Secondary | ICD-10-CM | POA: Diagnosis not present

## 2023-11-11 DIAGNOSIS — Z79890 Hormone replacement therapy (postmenopausal): Secondary | ICD-10-CM | POA: Diagnosis not present

## 2023-11-11 DIAGNOSIS — Z79899 Other long term (current) drug therapy: Secondary | ICD-10-CM | POA: Diagnosis not present

## 2023-11-11 DIAGNOSIS — Z888 Allergy status to other drugs, medicaments and biological substances status: Secondary | ICD-10-CM | POA: Diagnosis not present

## 2023-11-11 DIAGNOSIS — Z91030 Bee allergy status: Secondary | ICD-10-CM | POA: Diagnosis not present

## 2023-11-11 DIAGNOSIS — I428 Other cardiomyopathies: Secondary | ICD-10-CM | POA: Diagnosis not present

## 2023-11-11 DIAGNOSIS — I824Y2 Acute embolism and thrombosis of unspecified deep veins of left proximal lower extremity: Secondary | ICD-10-CM | POA: Diagnosis not present

## 2023-11-11 DIAGNOSIS — Z87891 Personal history of nicotine dependence: Secondary | ICD-10-CM | POA: Diagnosis not present

## 2023-11-11 DIAGNOSIS — I82422 Acute embolism and thrombosis of left iliac vein: Secondary | ICD-10-CM | POA: Diagnosis not present

## 2023-11-11 DIAGNOSIS — I871 Compression of vein: Secondary | ICD-10-CM | POA: Diagnosis not present

## 2023-11-11 DIAGNOSIS — I5022 Chronic systolic (congestive) heart failure: Secondary | ICD-10-CM | POA: Diagnosis not present

## 2023-11-11 DIAGNOSIS — E7800 Pure hypercholesterolemia, unspecified: Secondary | ICD-10-CM | POA: Diagnosis not present

## 2023-11-11 DIAGNOSIS — I82412 Acute embolism and thrombosis of left femoral vein: Secondary | ICD-10-CM

## 2023-11-11 DIAGNOSIS — I110 Hypertensive heart disease with heart failure: Secondary | ICD-10-CM | POA: Diagnosis not present

## 2023-11-11 DIAGNOSIS — I5032 Chronic diastolic (congestive) heart failure: Secondary | ICD-10-CM | POA: Diagnosis not present

## 2023-11-11 DIAGNOSIS — Z6831 Body mass index (BMI) 31.0-31.9, adult: Secondary | ICD-10-CM | POA: Diagnosis not present

## 2023-11-11 DIAGNOSIS — I82492 Acute embolism and thrombosis of other specified deep vein of left lower extremity: Secondary | ICD-10-CM | POA: Diagnosis not present

## 2023-11-11 DIAGNOSIS — E1142 Type 2 diabetes mellitus with diabetic polyneuropathy: Secondary | ICD-10-CM | POA: Diagnosis not present

## 2023-11-11 DIAGNOSIS — Z95810 Presence of automatic (implantable) cardiac defibrillator: Secondary | ICD-10-CM | POA: Diagnosis not present

## 2023-11-11 DIAGNOSIS — Z85850 Personal history of malignant neoplasm of thyroid: Secondary | ICD-10-CM | POA: Diagnosis not present

## 2023-11-11 DIAGNOSIS — I2510 Atherosclerotic heart disease of native coronary artery without angina pectoris: Secondary | ICD-10-CM | POA: Diagnosis not present

## 2023-11-11 DIAGNOSIS — E669 Obesity, unspecified: Secondary | ICD-10-CM | POA: Diagnosis not present

## 2023-11-11 DIAGNOSIS — Z825 Family history of asthma and other chronic lower respiratory diseases: Secondary | ICD-10-CM | POA: Diagnosis not present

## 2023-11-11 HISTORY — PX: PERIPHERAL VASCULAR ULTRASOUND/IVUS: CATH118334

## 2023-11-11 HISTORY — PX: LOWER EXTREMITY VENOGRAPHY: CATH118253

## 2023-11-11 HISTORY — PX: PERIPHERAL VASCULAR THROMBECTOMY: CATH118306

## 2023-11-11 LAB — CBC WITH DIFFERENTIAL/PLATELET
Abs Immature Granulocytes: 0.03 10*3/uL (ref 0.00–0.07)
Basophils Absolute: 0 10*3/uL (ref 0.0–0.1)
Basophils Relative: 1 %
Eosinophils Absolute: 0.3 10*3/uL (ref 0.0–0.5)
Eosinophils Relative: 4 %
HCT: 37.9 % — ABNORMAL LOW (ref 39.0–52.0)
Hemoglobin: 12.9 g/dL — ABNORMAL LOW (ref 13.0–17.0)
Immature Granulocytes: 1 %
Lymphocytes Relative: 17 %
Lymphs Abs: 1 10*3/uL (ref 0.7–4.0)
MCH: 30.4 pg (ref 26.0–34.0)
MCHC: 34 g/dL (ref 30.0–36.0)
MCV: 89.4 fL (ref 80.0–100.0)
Monocytes Absolute: 0.8 10*3/uL (ref 0.1–1.0)
Monocytes Relative: 13 %
Neutro Abs: 3.9 10*3/uL (ref 1.7–7.7)
Neutrophils Relative %: 64 %
Platelets: 217 10*3/uL (ref 150–400)
RBC: 4.24 MIL/uL (ref 4.22–5.81)
RDW: 13.4 % (ref 11.5–15.5)
WBC: 5.9 10*3/uL (ref 4.0–10.5)
nRBC: 0 % (ref 0.0–0.2)

## 2023-11-11 LAB — CBC
HCT: 37 % — ABNORMAL LOW (ref 39.0–52.0)
HCT: 34.5 % — ABNORMAL LOW (ref 39.0–52.0)
Hemoglobin: 12.6 g/dL — ABNORMAL LOW (ref 13.0–17.0)
Hemoglobin: 11.7 g/dL — ABNORMAL LOW (ref 13.0–17.0)
MCH: 30.5 pg (ref 26.0–34.0)
MCH: 30.3 pg (ref 26.0–34.0)
MCHC: 34.1 g/dL (ref 30.0–36.0)
MCHC: 33.9 g/dL (ref 30.0–36.0)
MCV: 89.6 fL (ref 80.0–100.0)
MCV: 89.4 fL (ref 80.0–100.0)
Platelets: 245 10*3/uL (ref 150–400)
Platelets: 199 10*3/uL (ref 150–400)
RBC: 4.13 MIL/uL — ABNORMAL LOW (ref 4.22–5.81)
RBC: 3.86 MIL/uL — ABNORMAL LOW (ref 4.22–5.81)
RDW: 13.2 % (ref 11.5–15.5)
RDW: 13.4 % (ref 11.5–15.5)
WBC: 8.1 10*3/uL (ref 4.0–10.5)
WBC: 7.4 10*3/uL (ref 4.0–10.5)
nRBC: 0 % (ref 0.0–0.2)
nRBC: 0 % (ref 0.0–0.2)

## 2023-11-11 LAB — HEPARIN LEVEL (UNFRACTIONATED)
Heparin Unfractionated: 0.57 [IU]/mL (ref 0.30–0.70)
Heparin Unfractionated: 0.62 [IU]/mL (ref 0.30–0.70)
Heparin Unfractionated: <0.1 [IU]/mL — ABNORMAL LOW (ref 0.30–0.70)

## 2023-11-11 LAB — FIBRINOGEN
Fibrinogen: 591 mg/dL — ABNORMAL HIGH (ref 210–475)
Fibrinogen: 233 mg/dL (ref 210–475)

## 2023-11-11 LAB — BASIC METABOLIC PANEL
Anion gap: 10 (ref 5–15)
BUN: 21 mg/dL (ref 8–23)
CO2: 24 mmol/L (ref 22–32)
Calcium: 9.3 mg/dL (ref 8.9–10.3)
Chloride: 105 mmol/L (ref 98–111)
Creatinine, Ser: 1.27 mg/dL — ABNORMAL HIGH (ref 0.61–1.24)
GFR, Estimated: >60 mL/min (ref >60)
Glucose, Bld: 202 mg/dL — ABNORMAL HIGH (ref 70–99)
Potassium: 4 mmol/L (ref 3.5–5.1)
Sodium: 139 mmol/L (ref 135–145)

## 2023-11-11 SURGERY — PERIPHERAL VASCULAR THROMBECTOMY
Anesthesia: LOCAL | Laterality: Left

## 2023-11-11 MED ORDER — HEPARIN (PORCINE) 25000 UT/250ML-% IV SOLN
1500.0000 [IU]/h | INTRAVENOUS | Status: DC
  Administered 2023-11-11: 1200 [IU]/h via INTRAVENOUS

## 2023-11-11 MED ORDER — MIDAZOLAM HCL 2 MG/2ML IJ SOLN
INTRAMUSCULAR | Status: AC
  Filled 2023-11-11: qty 2

## 2023-11-11 MED ORDER — LIDOCAINE HCL (PF) 1 % IJ SOLN
INTRAMUSCULAR | Status: DC | PRN
  Administered 2023-11-11: 10 mL

## 2023-11-11 MED ORDER — HEPARIN SODIUM (PORCINE) 1000 UNIT/ML IJ SOLN
INTRAMUSCULAR | Status: DC | PRN
  Administered 2023-11-11: 10000 [IU] via INTRAVENOUS

## 2023-11-11 MED ORDER — CHLORHEXIDINE GLUCONATE CLOTH 2 % EX PADS
6.0000 | MEDICATED_PAD | Freq: Every day | CUTANEOUS | Status: DC
  Administered 2023-11-11 – 2023-11-12 (×2): 6 via TOPICAL

## 2023-11-11 MED ORDER — HYDRALAZINE HCL 20 MG/ML IJ SOLN
10.0000 mg | Freq: Four times a day (QID) | INTRAMUSCULAR | Status: DC | PRN
  Administered 2023-11-11: 10 mg via INTRAVENOUS
  Filled 2023-11-11: qty 1

## 2023-11-11 MED ORDER — MIDAZOLAM HCL 2 MG/2ML IJ SOLN
INTRAMUSCULAR | Status: DC | PRN
  Administered 2023-11-11: 1 mg via INTRAVENOUS

## 2023-11-11 MED ORDER — HEPARIN (PORCINE) 25000 UT/250ML-% IV SOLN
800.0000 [IU]/h | INTRAVENOUS | Status: DC
  Administered 2023-11-11: 800 [IU]/h via INTRAVENOUS
  Filled 2023-11-11: qty 250

## 2023-11-11 MED ORDER — IODIXANOL 320 MG/ML IV SOLN
INTRAVENOUS | Status: DC | PRN
  Administered 2023-11-11: 50 mL

## 2023-11-11 MED ORDER — SODIUM CHLORIDE 0.9 % IV SOLN: 250.0000 mL | INTRAVENOUS | Status: AC | PRN

## 2023-11-11 MED ORDER — HEPARIN (PORCINE) IN NACL 1000-0.9 UT/500ML-% IV SOLN
INTRAVENOUS | Status: DC | PRN
  Administered 2023-11-11: 500 mL

## 2023-11-11 MED ORDER — FENTANYL CITRATE (PF) 100 MCG/2ML IJ SOLN
INTRAMUSCULAR | Status: DC | PRN
  Administered 2023-11-11: 50 ug via INTRAVENOUS

## 2023-11-11 MED ORDER — LIDOCAINE HCL (PF) 1 % IJ SOLN
INTRAMUSCULAR | Status: AC
  Filled 2023-11-11: qty 30

## 2023-11-11 MED ORDER — MORPHINE SULFATE (PF) 2 MG/ML IV SOLN: 5.0000 mg | INTRAVENOUS | Status: DC | PRN

## 2023-11-11 MED ORDER — MIDAZOLAM HCL 2 MG/2ML IJ SOLN
1.0000 mg | INTRAMUSCULAR | Status: DC | PRN
  Filled 2023-11-11: qty 2

## 2023-11-11 MED ORDER — LABETALOL HCL 5 MG/ML IV SOLN
10.0000 mg | INTRAVENOUS | Status: DC | PRN
  Administered 2023-11-12: 10 mg via INTRAVENOUS
  Filled 2023-11-11: qty 4

## 2023-11-11 MED ORDER — FENTANYL CITRATE (PF) 100 MCG/2ML IJ SOLN
INTRAMUSCULAR | Status: AC
  Filled 2023-11-11: qty 2

## 2023-11-11 MED ORDER — HYDRALAZINE HCL 20 MG/ML IJ SOLN
INTRAMUSCULAR | Status: AC
  Filled 2023-11-11: qty 1

## 2023-11-11 MED ORDER — SODIUM CHLORIDE 0.9% FLUSH
3.0000 mL | Freq: Two times a day (BID) | INTRAVENOUS | Status: DC
  Administered 2023-11-11 – 2023-11-13 (×3): 3 mL via INTRAVENOUS

## 2023-11-11 MED ORDER — HYDRALAZINE HCL 20 MG/ML IJ SOLN
INTRAMUSCULAR | Status: DC | PRN
  Administered 2023-11-11: 10 mg via INTRAVENOUS

## 2023-11-11 MED ORDER — SODIUM CHLORIDE 0.9% FLUSH: 3.0000 mL | INTRAVENOUS | Status: DC | PRN

## 2023-11-11 MED ORDER — SODIUM CHLORIDE 0.9 % IV SOLN
1.0000 mg/h | INTRAVENOUS | Status: DC
  Administered 2023-11-11: 1 mg/h
  Filled 2023-11-11 (×2): qty 24

## 2023-11-11 SURGICAL SUPPLY — 17 items
CATH ANGIO 5F BER 100CM (CATHETERS) IMPLANT
CATH CLOT TRIEVER XL (CATHETERS) IMPLANT
CATH EKOS ULTRASOUND 135X50 (CATHETERS) IMPLANT
CATH LIGHTNI FLASH 16XTORQ 100 (CATHETERS) IMPLANT
CATH QUICKCROSS SUPP .035X90CM (MICROCATHETER) IMPLANT
CATH SMARTCLAW THROMBECTOMY 20 (CATHETERS) IMPLANT
CATH VISIONS PV .035 IVUS (CATHETERS) IMPLANT
GLIDEWIRE ADV .035X260CM (WIRE) IMPLANT
KIT MICROPUNCTURE NIT STIFF (SHEATH) IMPLANT
KIT SYRINGE INJ CVI SPIKEX1 (MISCELLANEOUS) IMPLANT
SET ATX-X65L (MISCELLANEOUS) IMPLANT
SHEATH CLOT RETRIEVER 16FR (SHEATH) IMPLANT
SHEATH PERFORMER 16FR 30 (SHEATH) IMPLANT
SHEATH PINNACLE 8F 10CM (SHEATH) IMPLANT
SHEATH PROBE COVER 6X72 (BAG) IMPLANT
TRAY PV CATH (CUSTOM PROCEDURE TRAY) IMPLANT
WIRE G V18X300CM (WIRE) IMPLANT

## 2023-11-11 NOTE — Plan of Care (Signed)

## 2023-11-11 NOTE — Progress Notes (Signed)
PROGRESS NOTE  Mike Jimenez  DOB: [birth year]  PCP: Wanda Plump, MD ZOX:[id number]  DOA: 11/09/2023  LOS: 0 days  Hospital Day: 3  Brief narrative: Mike Jimenez is a 72 y.o. male with PMH significant for obesity, DM2, HTN, HLD, CAD, CHF, V. tach, nonischemic cardiomyopathy s/p BiV PPM, chronic anemia, gastric ulcer, neuropathy, history of thyroid cancer s/p thyroidectomy 2013. 12/11, patient was seen by his PCP for 1 week history of left leg swelling.  DVT was suspected and patient was sent to ED at [facility].  In the ED, CT venogram showed extensive left lower extremity DVT CT PE was negative for acute pulm embolism EDP discussed with vascular surgery Dr. Myra Gianotti Recommended to start IV heparin drip and admission to [facility] for possible need of thrombectomy.  Subjective: Patient was seen and examined this morning. Lying down in bed.  Feels that he is left leg swelling is improving.  When he manages to walk to the bathroom, feels intense pressure in the leg probably due to venous obstruction from DVT Pending thrombectomy in OR by vascular surgery today.  Remains n.p.o. Blood pressure in 140s this morning.  Creatinine slightly up to 1.27.  Lasix remains on hold  Assessment and plan: Extensive acute left lower extremity DVT Presented with left leg swelling.  Ultrasound duplex as above. CTPA negative for pulm embolism Currently on heparin drip Vascular surgery plans for thrombectomy today  Chronic HFpEF Essential hypertension PTA meds- Coreg 3.125 mg twice daily, Lasix 20 mg daily, losartan 50 mg at bedtime Currently continued on Coreg and losartan.  Lasix remains on hold.  Blood pressure in 140s.  Continue to monitor  CAD, HLD Denies any anginal symptoms PTA meds- aspirin 81 mg daily, Lipitor 40 mg daily Continue both   H/o thyroid cancer s/p thyroidectomy-2013 Continue Synthroid  Obesity  Body mass index is 31.36 kg/m. Patient has been advised to make an  attempt to improve diet and exercise patterns to aid in weight loss.   Mobility: Need to encourage ambulation after thrombectomy  Goals of care   Code Status: Full Code     DVT prophylaxis: Heparin drip   Antimicrobials: None Fluid: None Consultants: Vascular surgery Family Communication: None at bedside.  Status: Observation Level of care:  Telemetry Medical   Patient is from: Home.  Lives with his wife and 11 dogs Needs to continue in-hospital care: Pending thrombectomy today Anticipated d/c to: Pending clinical course      Diet:  Diet Order             Diet NPO time specified Except for: Sips with Meds  Diet effective midnight                   Scheduled Meds:  aspirin EC  81 mg Oral Daily   atorvastatin  40 mg Oral Daily   carvedilol  3.125 mg Oral Daily   levothyroxine  168 mcg Oral Q0600   losartan  50 mg Oral Daily    PRN meds: acetaminophen, HYDROmorphone (DILAUDID) injection, melatonin, oxyCODONE, polyethylene glycol, prochlorperazine   Infusions:   heparin 1,750 Units/hr (11/11/23 8119)    Antimicrobials: Anti-infectives (From admission, onward)    None       Objective: Vitals:   11/11/23 0643 11/11/23 0834  BP:  (!) 148/74  Pulse:  65  Resp:  17  Temp: 98 F (36.7 C) 98.1 F (36.7 C)  SpO2:  95%    Intake/Output Summary (  Last 24 hours) at 11/11/2023 0955 Last data filed at 11/11/2023 0644 Gross per 24 hour  Intake --  Output 600 ml  Net -600 ml   Filed Weights   11/09/23 1553  Weight: 110.8 kg   Weight change:  Body mass index is 31.36 kg/m.   Physical Exam: General exam: Pleasant, elderly Caucasian male.  Not in distress Skin: No rashes, lesions or ulcers. HEENT: Atraumatic, normocephalic, no obvious bleeding Lungs: Clear to auscultation bilaterally CVS: Regular rate and rhythm, no murmur GI/Abd soft, nontender, nondistended, bowel sound present CNS: Alert, awake, oriented x 3 Psychiatry: Mood  appropriate Extremities: Left leg grossly swollen, I do not see significant improvement in swelling.  Right leg normal  Data Review: I have personally reviewed the laboratory data and studies available.  F/u labs ordered Unresulted Labs (From admission, onward)     Start     Ordered   11/11/23 0500  CBC with Differential/Platelet  Daily,   R      11/10/23 1355   11/11/23 0500  Basic metabolic panel  Daily,   R      11/10/23 1355   11/10/23 0500  Heparin level (unfractionated)  Daily,   R      11/09/23 2338            Total time spent in review of labs and imaging, patient evaluation, formulation of plan, documentation and communication with family: 45 minutes  Signed, Lorin Glass, MD [facility] 11/11/2023

## 2023-11-11 NOTE — Progress Notes (Signed)
ANTICOAGULATION CONSULT NOTE   Pharmacy Consult for Heparin Indication: LLE DVT   Allergies  Allergen Reactions   Bee Venom Swelling   Amiodarone Other (See Comments)   Ramipril Cough    Patient Measurements: Height: 6\' 2"  (188 cm) Weight: 110.8 kg (244 lb 4.3 oz) IBW/kg (Calculated) : 82.2 Heparin Dosing Weight: 105 kg  Vital Signs: Temp: 98.1 F (36.7 C) (12/13 0834) Temp Source: Oral (12/13 0834) BP: 148/74 (12/13 0834) Pulse Rate: 65 (12/13 0834)  Labs: Recent Labs    11/09/23 1556 11/10/23 0614 11/10/23 1328 11/11/23 0738  HGB 13.0 12.3*  --  12.9*  HCT 38.2* 35.3*  --  37.9*  PLT 247 213  --  217  HEPARINUNFRC  --  0.56 0.60 0.57  CREATININE 1.09 1.15  --  1.27*  TROPONINIHS 9  --   --   --     Estimated Creatinine Clearance: 70.6 mL/min (A) (by C-G formula based on SCr of 1.27 mg/dL (H)).   Medical History: Past Medical History:  Diagnosis Date   Anemia    Arthritis    "knees" (09/05/2014)   Automatic implantable cardioverter-defibrillator in situ    CAD (coronary artery disease)    a. Mild nonobstructive CAD by cath 08/2014.   CHF (congestive heart failure) (HCC)    Childhood asthma    Gastric ulcer 2011   GERD (gastroesophageal reflux disease)    History of gout    Hyperlipidemia    Hypertension    LBBB (left bundle branch block)    NICM (nonischemic cardiomyopathy) (HCC)    a. Dx 2011 - presented with sustained VT. Normal coronaries at that time, EF 10-15% by cath and 20-25% by echo. b. s/p biventricular ICD 2011. c. CP 08/2014: mild nonobstructive CAD.   Obesity    Postsurgical hypothyroidism    Thyroid cancer (HCC)    a. 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 Dr Gerrit Friends. s/p thyroidectomy 2013.   Torn meniscus    Type II diabetes mellitus (HCC)    Ventricular tachycardia (HCC)    a. Sustained VT 2011 s/p MDT D274TRK Concert BiV ICD, ser# [id number] H.      Assessment: 72 yo M admitted with new LLE DVT. No anticoagulation  prior to admission. Pharmacy consulted for heparin.     Heparin level continues to be therapeutic. Plan for thrombectomy today. Will f/u for plan post procedure.   Goal of Therapy:  Heparin level 0.3-0.7 units/ml Monitor platelets by anticoagulation protocol: Yes   Plan:  Continue heparin infusion at 1750 units/hr Monitor daily heparin level, CBC, signs/symptoms of bleeding  F/u long term anticoagulation plan   Ulyses Southward, PharmD, BCIDP, AAHIVP, CPP Infectious Disease Pharmacist 11/11/2023 9:31 AM

## 2023-11-11 NOTE — Progress Notes (Signed)
ANTICOAGULATION CONSULT NOTE   Pharmacy Consult for Heparin Indication: LLE DVT   Allergies  Allergen Reactions   Bee Venom Swelling   Amiodarone Other (See Comments)   Ramipril Cough    Patient Measurements: Height: 6\' 2"  (188 cm) Weight: 110.8 kg (244 lb 4.3 oz) IBW/kg (Calculated) : 82.2 Heparin Dosing Weight: 105 kg  Vital Signs: Temp: 98.7 F (37.1 C) (12/13 2021) Temp Source: Oral (12/13 2021) BP: 156/80 (12/13 2200) Pulse Rate: 79 (12/13 2200)  Labs: Recent Labs    11/09/23 1556 11/10/23 0614 11/10/23 1328 11/11/23 0738 11/11/23 1703 11/11/23 2213  HGB 13.0 12.3*  --  12.9* 12.6* 11.7*  HCT 38.2* 35.3*  --  37.9* 37.0* 34.5*  PLT 247 213  --  217 245 199  HEPARINUNFRC  --  0.56   < > 0.57 0.62 <0.10*  CREATININE 1.09 1.15  --  1.27*  --   --   TROPONINIHS 9  --   --   --   --   --    < > = values in this interval not displayed.    Estimated Creatinine Clearance: 70.6 mL/min (A) (by C-G formula based on SCr of 1.27 mg/dL (H)).    Assessment: 72 yo M admitted with new LLE DVT. No anticoagulation prior to admission. Pharmacy consulted for heparin.     Heparin level 0.57 (therapeutic) pre-procedure at 1750 units/hr this morning.   S/p mechanical thrombectomy and initiation of cath-directed thrombolysis alteplase 12/13. 10,000 units heparin given in cath lab ~1400. Heparin restarted at 800 units/hr post procedure with pharmacy to adjust.  Goal of Therapy:  Heparin level 0.2-0.5 units/ml Monitor platelets by anticoagulation protocol: Yes   Plan:  Altepase 1mg /hr via catheter Continue heparin infusion at 800 units/hr as started post procedure Will f/u 6 hr heparin level, fibrinogen, CBC as ordered post procedure F/u long term anticoagulation plan  Christoper Fabian, PharmD, BCPS Please see amion for complete clinical pharmacist phone list 11/11/2023 10:46 PM  ADDENDUM (1815) Heparin level 0.62 (slightly supratherapeutic) - lab drawn ~3 hour post large  bolus in cath lab so that is likely affecting level and causing it to look falsely elevate.  Christoper Fabian, PharmD, BCPS Please see amion for complete clinical pharmacist phone list 11/11/2023 10:46 PM  ADDENDUM (2245) Heparin level now back undetectable on heparin 800 units/hr. Not too surprising since pt was requiring closer to 1750 units/hr prior to thrombectomy. There is serosanguinous leaking from the catheter site due to size difference between the catheter and sheath - possible that some of heparin is leaking here - Dr. Lenell Antu aware of this. No bleeding noted. Hgb down a bit to 11.7. Fibrinogen 233.  Plan: Increase heparin to 1200 units/hr Will f/u heparin level in 6 hours  Christoper Fabian, PharmD, BCPS Please see amion for complete clinical pharmacist phone list 11/11/2023 10:47 PM

## 2023-11-11 NOTE — H&P (View-Only) (Signed)
  Progress Note    11/11/2023 7:42 AM * No surgery found *  Subjective:  says his leg swelling is a little better    Vitals:   11/11/23 0600 11/11/23 0643  BP:    Pulse:    Resp:    Temp: 98 F (36.7 C) 98 F (36.7 C)  SpO2:      Physical Exam: General:  resting comfortably Extremities:  LLE edematous, no discoloration. Intact motor and sensation   CBC    Component Value Date/Time   WBC 6.3 11/10/2023 0614   RBC 3.96 (L) 11/10/2023 0614   HGB 12.3 (L) 11/10/2023 0614   HGB 15.0 05/06/2022 1206   HCT 35.3 (L) 11/10/2023 0614   HCT 43.2 05/06/2022 1206   PLT 213 11/10/2023 0614   PLT 288 05/06/2022 1206   MCV 89.1 11/10/2023 0614   MCV 89 05/06/2022 1206   MCH 31.1 11/10/2023 0614   MCHC 34.8 11/10/2023 0614   RDW 13.5 11/10/2023 0614   RDW 13.1 05/06/2022 1206   LYMPHSABS 1.0 11/09/2023 1556   MONOABS 0.7 11/09/2023 1556   EOSABS 0.2 11/09/2023 1556   BASOSABS 0.0 11/09/2023 1556    BMET    Component Value Date/Time   NA 138 11/10/2023 0614   NA 145 (H) 05/06/2022 1206   K 3.8 11/10/2023 0614   CL 101 11/10/2023 0614   CO2 24 11/10/2023 0614   GLUCOSE 169 (H) 11/10/2023 0614   BUN 22 11/10/2023 0614   BUN 20 05/06/2022 1206   CREATININE 1.15 11/10/2023 0614   CREATININE 1.29 (H) 05/14/2016 0944   CALCIUM 9.3 11/10/2023 0614   GFRNONAA >60 11/10/2023 0614   GFRNONAA 81 03/07/2016 1346   GFRAA 77 10/16/2020 0000   GFRAA >89 03/07/2016 1346    INR    Component Value Date/Time   INR 1.23 09/06/2014 0343     Intake/Output Summary (Last 24 hours) at 11/11/2023 0742 Last data filed at 11/11/2023 0644 Gross per 24 hour  Intake --  Output 600 ml  Net -600 ml      Assessment/Plan:  72 y.o. male with LLE DVT   -Says his left leg swelling has gotten a little better since being on heparin -LLE well perfused with intact motor and sensation -Plans for LLE venous thrombectomy in cath lab today. Has been NPO since midnight. All questions  answered    Loel Dubonnet, [place] [facility] [phone] 11/11/2023 7:42 AM

## 2023-11-11 NOTE — Op Note (Signed)
DATE OF SERVICE: 11/11/2023  PATIENT:  Mike Jimenez  72 y.o. 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  POST-OPERATIVE DIAGNOSIS:  Same  PROCEDURE:   1) ultrasound guided left popliteal vein access 2) intravascular ultrasound of the inferior vena cava, left iliac veins, left femoral veins 3) endovascular mechanical thrombectomy with CAT 16 penumbra device and SmartClaw device 4) endovascular mechanical thrombectomy with Inari Clotriver device 5) initiation of catheter directed thrombolysis 6) conscious sedation (75 minutes)  SURGEON:  Surgeons and Role:    * [name], MD - Primary  ASSISTANT: none  ANESTHESIA:   local and IV sedation  EBL:  BLOOD ADMINISTERED:none  DRAINS: none   LOCAL MEDICATIONS USED:  LIDOCAINE   SPECIMEN:  minimal  COUNTS: confirmed correct.  TOURNIQUET:  none  PATIENT DISPOSITION:  ICU - extubated and stable.   Delay start of Pharmacological VTE agent (>24hrs) due to surgical blood loss or risk of bleeding: no  INDICATION FOR PROCEDURE: Mike Jimenez is a 72 y.o. male with left lower extremity acute iliofemoral deep venous thrombosis. After careful discussion of risks, benefits, and alternatives the patient was offered endovenous thrombectomy. The patient understood and wished to proceed.  OPERATIVE FINDINGS: Intravascular ultrasound shows likely chronic component to the thrombus.  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  Severe burden of thrombus retrieved from left leg. Incomplete response despite multiple thrombectomy devices used. Elected to initiate catheter directed thrombolysis to try to clear thrombus burden.   DESCRIPTION OF PROCEDURE: After identification of the patient in the pre-operative holding area, the patient was transferred to the operating room. The patient was  positioned prone on the operating room table. Anesthesia was induced. The left popliteal space was prepped and draped in standard fashion. A surgical pause was performed confirming correct patient, procedure, and operative location.  Duplex ultrasound was used to visualize the left popliteal fossa.  Micropuncture technique was used to obtain access in the left popliteal vein.  Seldinger technique was used to upsize her access to 8 [place].  A Glidewire advantage was navigated into the inferior vena cava.  There is an obvious stenosis at the confluence of the left common iliac vein and superior vena cava based on wire behavior.  Intravascular ultrasound device was navigated over the wire into the inferior vena cava.  A pullback intravascular ultrasound was performed confirming the stenosis in the central common iliac vein, as well as acute and chronic thrombus in the left iliac veins, left femoral veins.  Patient was systemically heparinized.  Endovascular thrombectomy was performed in segments using the penumbra CAT 16 device and the Smart claw device.  Greater than 500 cc of thrombus returned.  This overwhelmed the penumbra catheter.  An Inari clot triever device was brought onto the field and prepared per [employer].  Several passes of the device did not resolve the disease in the iliac veins.  Follow-up venogram was performed the femoral veins appeared significantly improved.  The iliac veins appeared to have persistent thrombus.  This was reconfirmed on intravascular ultrasound.  At this point I felt further effort at endovascular thrombectomy would not likely be successful.  Elected to perform catheter directed thrombolysis overnight to clear what ever acute clot we could meaningfully achieved.  He will likely need left iliac vein stenting tomorrow based on intravascular ultrasound.    An EKOS thrombolysis  catheter was positioned from the IVC to the common femoral vein to initiate  catheter directed thrombolysis.  This was prepared per manufacturer's instructions.  The wire was removed.  Otherwise all endovascular equipment was left in place.  Upon completion of the case instrument and sharps counts were confirmed correct. The patient was transferred to the PACU in good condition. I was present for all portions of the procedure.  Rande Brunt. Lenell Antu, MD [facility] Phone Number: [phone] 11/11/2023 2:30 PM

## 2023-11-11 NOTE — Progress Notes (Addendum)
  Progress Note    11/11/2023 7:42 AM * No surgery found *  Subjective:  says his leg swelling is a little better    Vitals:   11/11/23 0600 11/11/23 0643  BP:    Pulse:    Resp:    Temp: 98 F (36.7 C) 98 F (36.7 C)  SpO2:      Physical Exam: General:  resting comfortably Extremities:  LLE edematous, no discoloration. Intact motor and sensation   CBC    Component Value Date/Time   WBC 6.3 11/10/2023 0614   RBC 3.96 (L) 11/10/2023 0614   HGB 12.3 (L) 11/10/2023 0614   HGB 15.0 05/06/2022 1206   HCT 35.3 (L) 11/10/2023 0614   HCT 43.2 05/06/2022 1206   PLT 213 11/10/2023 0614   PLT 288 05/06/2022 1206   MCV 89.1 11/10/2023 0614   MCV 89 05/06/2022 1206   MCH 31.1 11/10/2023 0614   MCHC 34.8 11/10/2023 0614   RDW 13.5 11/10/2023 0614   RDW 13.1 05/06/2022 1206   LYMPHSABS 1.0 11/09/2023 1556   MONOABS 0.7 11/09/2023 1556   EOSABS 0.2 11/09/2023 1556   BASOSABS 0.0 11/09/2023 1556    BMET    Component Value Date/Time   NA 138 11/10/2023 0614   NA 145 (H) 05/06/2022 1206   K 3.8 11/10/2023 0614   CL 101 11/10/2023 0614   CO2 24 11/10/2023 0614   GLUCOSE 169 (H) 11/10/2023 0614   BUN 22 11/10/2023 0614   BUN 20 05/06/2022 1206   CREATININE 1.15 11/10/2023 0614   CREATININE 1.29 (H) 05/14/2016 0944   CALCIUM 9.3 11/10/2023 0614   GFRNONAA >60 11/10/2023 0614   GFRNONAA 81 03/07/2016 1346   GFRAA 77 10/16/2020 0000   GFRAA >89 03/07/2016 1346    INR    Component Value Date/Time   INR 1.23 09/06/2014 0343     Intake/Output Summary (Last 24 hours) at 11/11/2023 0742 Last data filed at 11/11/2023 0644 Gross per 24 hour  Intake --  Output 600 ml  Net -600 ml      Assessment/Plan:  72 y.o. male with LLE DVT   -Says his left leg swelling has gotten a little better since being on heparin -LLE well perfused with intact motor and sensation -Plans for LLE venous thrombectomy in cath lab today. Has been NPO since midnight. All questions  answered    Loel Dubonnet, [place] [facility] [phone] 11/11/2023 7:42 AM

## 2023-11-11 NOTE — Progress Notes (Addendum)
ANTICOAGULATION CONSULT NOTE   Pharmacy Consult for Heparin Indication: LLE DVT   Allergies  Allergen Reactions   Bee Venom Swelling   Amiodarone Other (See Comments)   Ramipril Cough    Patient Measurements: Height: 6\' 2"  (188 cm) Weight: 110.8 kg (244 lb 4.3 oz) IBW/kg (Calculated) : 82.2 Heparin Dosing Weight: 105 kg  Vital Signs: Temp: 98.1 F (36.7 C) (12/13 0834) Temp Source: Oral (12/13 0834) BP: 181/107 (12/13 1454) Pulse Rate: 90 (12/13 1454)  Labs: Recent Labs    11/09/23 1556 11/10/23 0614 11/10/23 1328 11/11/23 0738  HGB 13.0 12.3*  --  12.9*  HCT 38.2* 35.3*  --  37.9*  PLT 247 213  --  217  HEPARINUNFRC  --  0.56 0.60 0.57  CREATININE 1.09 1.15  --  1.27*  TROPONINIHS 9  --   --   --     Estimated Creatinine Clearance: 70.6 mL/min (A) (by C-G formula based on SCr of 1.27 mg/dL (H)).    Assessment: 72 yo M admitted with new LLE DVT. No anticoagulation prior to admission. Pharmacy consulted for heparin.     Heparin level 0.57 (therapeutic) pre-procedure at 1750 units/hr this morning.   S/p mechanical thrombectomy and initiation of cath-directed thrombolysis alteplase 12/13. 10,000 units heparin given in cath lab ~1400. Heparin restarted at 800 units/hr post procedure with pharmacy to adjust.  Goal of Therapy:  Heparin level 0.2-0.5 units/ml Monitor platelets by anticoagulation protocol: Yes   Plan:  Altepase 1mg /hr via catheter Continue heparin infusion at 800 units/hr as started post procedure Will f/u 6 hr heparin level, fibrinogen, CBC as ordered post procedure F/u long term anticoagulation plan  Christoper Fabian, PharmD, BCPS Please see amion for complete clinical pharmacist phone list 11/11/2023 3:16 PM  ADDENDUM (1815) Heparin level 0.62 (slightly supratherapeutic) - lab drawn ~3 hour post large bolus in cath lab so that is likely affecting level and causing it to look falsely elevate.  Christoper Fabian, PharmD, BCPS Please see amion for  complete clinical pharmacist phone list 11/11/2023 6:17 PM

## 2023-11-11 NOTE — Progress Notes (Addendum)
Called to 2H 13  to view left popliteal venous access site for bleeding and oozing. Dressing was saturated with what appeared to be serosanuineous drainage. Sheath and catheter remained intact with medication infusing via sheath and catheter. Explained to RN that this was most likely due to a size difference between the catheter and sheath and that this was to be expected. Encouraged to reinforce dressing and to continue to monitor for excessive bleeding. Patient appears comfortable in bed with family at bedside. VSS.  Dr. Lenell Antu was called and is aware of situation. Instructed RN to call for any increase in bloody drainage or change in status.Mike Jimenez

## 2023-11-11 NOTE — Interval H&P Note (Signed)
History and Physical Interval Note:  11/11/2023 1:06 PM  Mike Jimenez  has presented today for surgery, with the diagnosis of dvt.  The various methods of treatment have been discussed with the patient and family. After consideration of risks, benefits and other options for treatment, the patient has consented to  Procedure(s): PERIPHERAL VASCULAR THROMBECTOMY (N/A) as a surgical intervention.  The patient's history has been reviewed, patient examined, no change in status, stable for surgery.  I have reviewed the patient's chart and labs.  Questions were answered to the patient's satisfaction.     [name]

## 2023-11-11 NOTE — Plan of Care (Signed)
  Problem: Education: Goal: Knowledge of General Education information will improve Description: Including pain rating scale, medication(s)/side effects and non-pharmacologic comfort measures Outcome: Progressing   Problem: Health Behavior/Discharge Planning: Goal: Ability to manage health-related needs will improve Outcome: Progressing   Problem: Clinical Measurements: Goal: Will remain free from infection Outcome: Progressing Goal: Diagnostic test results will improve Outcome: Progressing Goal: Respiratory complications will improve Outcome: Progressing   Problem: Clinical Measurements: Goal: Diagnostic test results will improve Outcome: Progressing   Problem: Clinical Measurements: Goal: Respiratory complications will improve Outcome: Progressing

## 2023-11-12 ENCOUNTER — Encounter (HOSPITAL_COMMUNITY): Payer: Self-pay | Admitting: Family Medicine

## 2023-11-12 ENCOUNTER — Other Ambulatory Visit: Payer: Self-pay

## 2023-11-12 ENCOUNTER — Inpatient Hospital Stay (HOSPITAL_COMMUNITY): Payer: PPO

## 2023-11-12 ENCOUNTER — Inpatient Hospital Stay (HOSPITAL_COMMUNITY): Payer: Self-pay | Admitting: Anesthesiology

## 2023-11-12 ENCOUNTER — Encounter (HOSPITAL_COMMUNITY)
Admission: EM | Disposition: A | Discharge status: Home / Self Care | Payer: Self-pay | Source: Home / Self Care | Attending: Internal Medicine

## 2023-11-12 ENCOUNTER — Inpatient Hospital Stay (HOSPITAL_COMMUNITY): Payer: PPO | Admitting: Anesthesiology

## 2023-11-12 ENCOUNTER — Other Ambulatory Visit: Payer: Self-pay | Admitting: Internal Medicine

## 2023-11-12 DIAGNOSIS — I824Y2 Acute embolism and thrombosis of unspecified deep veins of left proximal lower extremity: Secondary | ICD-10-CM | POA: Diagnosis not present

## 2023-11-12 DIAGNOSIS — I82412 Acute embolism and thrombosis of left femoral vein: Secondary | ICD-10-CM | POA: Diagnosis not present

## 2023-11-12 DIAGNOSIS — I82592 Chronic embolism and thrombosis of other specified deep vein of left lower extremity: Secondary | ICD-10-CM

## 2023-11-12 DIAGNOSIS — I82422 Acute embolism and thrombosis of left iliac vein: Secondary | ICD-10-CM | POA: Diagnosis not present

## 2023-11-12 HISTORY — PX: INSERTION OF ILIAC STENT: SHX6256

## 2023-11-12 LAB — CBC WITH DIFFERENTIAL/PLATELET
Abs Immature Granulocytes: 0.04 10*3/uL (ref 0.00–0.07)
Basophils Absolute: 0 10*3/uL (ref 0.0–0.1)
Basophils Relative: 0 %
Eosinophils Absolute: 0.1 10*3/uL (ref 0.0–0.5)
Eosinophils Relative: 2 %
HCT: 32.8 % — ABNORMAL LOW (ref 39.0–52.0)
Hemoglobin: 11.4 g/dL — ABNORMAL LOW (ref 13.0–17.0)
Immature Granulocytes: 1 %
Lymphocytes Relative: 10 %
Lymphs Abs: 0.7 10*3/uL (ref 0.7–4.0)
MCH: 31 pg (ref 26.0–34.0)
MCHC: 34.8 g/dL (ref 30.0–36.0)
MCV: 89.1 fL (ref 80.0–100.0)
Monocytes Absolute: 0.8 10*3/uL (ref 0.1–1.0)
Monocytes Relative: 11 %
Neutro Abs: 5.2 10*3/uL (ref 1.7–7.7)
Neutrophils Relative %: 76 %
Platelets: 172 10*3/uL (ref 150–400)
RBC: 3.68 MIL/uL — ABNORMAL LOW (ref 4.22–5.81)
RDW: 13.3 % (ref 11.5–15.5)
WBC: 6.9 10*3/uL (ref 4.0–10.5)
nRBC: 0 % (ref 0.0–0.2)

## 2023-11-12 LAB — BASIC METABOLIC PANEL
Anion gap: 4 — ABNORMAL LOW (ref 5–15)
BUN: 18 mg/dL (ref 8–23)
CO2: 23 mmol/L (ref 22–32)
Calcium: 8.5 mg/dL — ABNORMAL LOW (ref 8.9–10.3)
Chloride: 107 mmol/L (ref 98–111)
Creatinine, Ser: 1.13 mg/dL (ref 0.61–1.24)
GFR, Estimated: >60 mL/min (ref >60)
Glucose, Bld: 182 mg/dL — ABNORMAL HIGH (ref 70–99)
Potassium: 3.8 mmol/L (ref 3.5–5.1)
Sodium: 134 mmol/L — ABNORMAL LOW (ref 135–145)

## 2023-11-12 LAB — GLUCOSE, CAPILLARY
Glucose-Capillary: 179 mg/dL — ABNORMAL HIGH (ref 70–99)
Glucose-Capillary: 168 mg/dL — ABNORMAL HIGH (ref 70–99)

## 2023-11-12 LAB — CBC
HCT: 37.2 % — ABNORMAL LOW (ref 39.0–52.0)
Hemoglobin: 12.6 g/dL — ABNORMAL LOW (ref 13.0–17.0)
MCH: 31 pg (ref 26.0–34.0)
MCHC: 33.9 g/dL (ref 30.0–36.0)
MCV: 91.6 fL (ref 80.0–100.0)
Platelets: 173 10*3/uL (ref 150–400)
RBC: 4.06 MIL/uL — ABNORMAL LOW (ref 4.22–5.81)
RDW: 13.3 % (ref 11.5–15.5)
WBC: 10.2 10*3/uL (ref 4.0–10.5)
nRBC: 0 % (ref 0.0–0.2)

## 2023-11-12 LAB — FIBRINOGEN
Fibrinogen: 188 mg/dL — ABNORMAL LOW (ref 210–475)
Fibrinogen: 257 mg/dL (ref 210–475)

## 2023-11-12 LAB — HEPARIN LEVEL (UNFRACTIONATED): Heparin Unfractionated: <0.1 [IU]/mL — ABNORMAL LOW (ref 0.30–0.70)

## 2023-11-12 SURGERY — INSERTION, CATHETER, FOR THROMBOLYSIS
Anesthesia: Monitor Anesthesia Care | Site: Pelvis

## 2023-11-12 MED ORDER — CARVEDILOL 3.125 MG PO TABS
3.1250 mg | ORAL_TABLET | Freq: Two times a day (BID) | ORAL | Status: DC
  Administered 2023-11-12 – 2023-11-13 (×2): 3.125 mg via ORAL
  Filled 2023-11-12 (×2): qty 1

## 2023-11-12 MED ORDER — LIDOCAINE 2% (20 MG/ML) 5 ML SYRINGE
INTRAMUSCULAR | Status: DC | PRN
  Administered 2023-11-12: 40 mg via INTRAVENOUS

## 2023-11-12 MED ORDER — INSULIN ASPART 100 UNIT/ML IJ SOLN: 0.0000 [IU] | INTRAMUSCULAR | Status: DC | PRN

## 2023-11-12 MED ORDER — LIDOCAINE HCL (PF) 1 % IJ SOLN
INTRAMUSCULAR | Status: AC
  Filled 2023-11-12: qty 30

## 2023-11-12 MED ORDER — FENTANYL CITRATE (PF) 250 MCG/5ML IJ SOLN
INTRAMUSCULAR | Status: DC | PRN
  Administered 2023-11-12 (×4): 25 ug via INTRAVENOUS

## 2023-11-12 MED ORDER — LACTATED RINGERS IV SOLN: INTRAVENOUS | Status: DC

## 2023-11-12 MED ORDER — FENTANYL CITRATE (PF) 250 MCG/5ML IJ SOLN
INTRAMUSCULAR | Status: AC
  Filled 2023-11-12: qty 5

## 2023-11-12 MED ORDER — CHLORHEXIDINE GLUCONATE 0.12 % MT SOLN
OROMUCOSAL | Status: AC
  Filled 2023-11-12: qty 15

## 2023-11-12 MED ORDER — MIDAZOLAM HCL 2 MG/2ML IJ SOLN
INTRAMUSCULAR | Status: DC | PRN
  Administered 2023-11-12: 1 mg via INTRAVENOUS

## 2023-11-12 MED ORDER — CHLORHEXIDINE GLUCONATE 0.12 % MT SOLN: 15.0000 mL | Freq: Once | OROMUCOSAL | Status: DC

## 2023-11-12 MED ORDER — HEPARIN 6000 UNIT IRRIGATION SOLUTION
Status: DC | PRN
  Administered 2023-11-12: 1

## 2023-11-12 MED ORDER — APIXABAN 5 MG PO TABS
5.0000 mg | ORAL_TABLET | Freq: Two times a day (BID) | ORAL | Status: DC
Start: 2023-11-12 — End: 2023-11-13
  Administered 2023-11-12 – 2023-11-13 (×3): 5 mg via ORAL
  Filled 2023-11-12 (×3): qty 1

## 2023-11-12 MED ORDER — LIDOCAINE 1 % OPTIME INJ - NO CHARGE
INTRAMUSCULAR | Status: DC | PRN
  Administered 2023-11-12: 4 mL

## 2023-11-12 MED ORDER — HEPARIN SODIUM (PORCINE) 1000 UNIT/ML IJ SOLN
INTRAMUSCULAR | Status: DC | PRN
  Administered 2023-11-12: 10000 [IU] via INTRAVENOUS

## 2023-11-12 MED ORDER — PROPOFOL 10 MG/ML IV BOLUS
INTRAVENOUS | Status: AC
  Filled 2023-11-12: qty 20

## 2023-11-12 MED ORDER — ORAL CARE MOUTH RINSE: 15.0000 mL | Freq: Once | OROMUCOSAL | Status: DC

## 2023-11-12 MED ORDER — MIDAZOLAM HCL (PF) 10 MG/2ML IJ SOLN
INTRAMUSCULAR | Status: AC
Start: 2023-11-12 — End: ?
  Filled 2023-11-12: qty 2

## 2023-11-12 MED ORDER — HEPARIN 6000 UNIT IRRIGATION SOLUTION
Status: AC
  Filled 2023-11-12: qty 500

## 2023-11-12 MED ORDER — SODIUM CHLORIDE (PF) 0.9 % IJ SOLN
INTRAVENOUS | Status: DC | PRN
  Administered 2023-11-12: 20 mL via INTRAMUSCULAR

## 2023-11-12 MED ORDER — PROPOFOL 500 MG/50ML IV EMUL
INTRAVENOUS | Status: DC | PRN
  Administered 2023-11-12: 25 ug/kg/min via INTRAVENOUS

## 2023-11-12 MED ORDER — ONDANSETRON HCL 4 MG/2ML IJ SOLN
INTRAMUSCULAR | Status: DC | PRN
  Administered 2023-11-12: 4 mg via INTRAVENOUS

## 2023-11-12 SURGICAL SUPPLY — 34 items
BAG COUNTER SPONGE SURGICOUNT (BAG) ×2 IMPLANT
BALLN ATLAS 16X40X75 (BALLOONS) ×2
BALLOON ATLAS 16X40X75 (BALLOONS) IMPLANT
BNDG ELASTIC 6INX 5YD STR LF (GAUZE/BANDAGES/DRESSINGS) IMPLANT
BNDG GAUZE DERMACEA FLUFF 4 (GAUZE/BANDAGES/DRESSINGS) IMPLANT
CANISTER SUCT 3000ML PPV (MISCELLANEOUS) ×2 IMPLANT
CATH BEACON 5 .035 65 KMP TIP (CATHETERS) IMPLANT
CATH VISIONS PV .035 IVUS (CATHETERS) IMPLANT
COVER MAYO STAND STRL (DRAPES) IMPLANT
GAUZE 4X4 16PLY ~~LOC~~+RFID DBL (SPONGE) IMPLANT
GAUZE SPONGE 4X4 12PLY STRL (GAUZE/BANDAGES/DRESSINGS) IMPLANT
GLIDEWIRE ADV .035X260CM (WIRE) IMPLANT
GLOVE BIO SURGEON STRL SZ7.5 (GLOVE) ×2 IMPLANT
GLOVE INDICATOR 8.0 STRL GRN (GLOVE) ×2 IMPLANT
GOWN STRL REUS W/ TWL LRG LVL3 (GOWN DISPOSABLE) ×4 IMPLANT
GOWN STRL REUS W/ TWL XL LVL3 (GOWN DISPOSABLE) ×2 IMPLANT
KIT BASIN OR (CUSTOM PROCEDURE TRAY) ×2 IMPLANT
KIT ENCORE 26 ADVANTAGE (KITS) IMPLANT
NDL HYPO 25GX1X1/2 BEV (NEEDLE) IMPLANT
NEEDLE HYPO 25GX1X1/2 BEV (NEEDLE) IMPLANT
NS IRRIG 1000ML POUR BTL (IV SOLUTION) ×2 IMPLANT
PACK ENDO MINOR (CUSTOM PROCEDURE TRAY) ×2 IMPLANT
PAD ARMBOARD 7.5X6 YLW CONV (MISCELLANEOUS) ×4 IMPLANT
SET MICROPUNCTURE 5F STIFF (MISCELLANEOUS) ×2 IMPLANT
SHEATH PINNACLE 5F 10CM (SHEATH) IMPLANT
SHEATH PINNACLE 8F 10CM (SHEATH) IMPLANT
SLEEVE ISOL F/PACE RF HD COVER (MISCELLANEOUS) IMPLANT
STENT VENOUS ABRE 18X150 (Permanent Stent) IMPLANT
SYR CONTROL 10ML LL (SYRINGE) IMPLANT
TOWEL GREEN STERILE (TOWEL DISPOSABLE) ×4 IMPLANT
TOWEL GREEN STERILE FF (TOWEL DISPOSABLE) ×2 IMPLANT
WATER STERILE IRR 1000ML POUR (IV SOLUTION) ×2 IMPLANT
WIRE AMPLATZ SS-J .035X260CM (WIRE) ×2 IMPLANT
WIRE BENTSON .035X145CM (WIRE) ×2 IMPLANT

## 2023-11-12 NOTE — Transfer of Care (Signed)
Immediate Anesthesia Transfer of Care Note  Patient: Mike Jimenez  Procedure(s) Performed: LYSIS CATHETER RECHECK (Pelvis) INSERTION OF LEFT ILIAC VEIN STENT (Left: Pelvis)  Patient Location: PACU  Anesthesia Type:MAC  Level of Consciousness: awake, alert , and oriented  Airway & Oxygen Therapy: Patient Spontanous Breathing  Post-op Assessment: Report given to RN and Post -op Vital signs reviewed and stable  Post vital signs: Reviewed and stable  Last Vitals:  Vitals Value Taken Time  BP 151/70 11/12/23 0904  Temp    Pulse 84 11/12/23 0906  Resp    SpO2 93 % 11/12/23 0906  Vitals shown include unfiled device data.  Last Pain:  Vitals:   11/12/23 0731  TempSrc:   PainSc: 0-No pain      Patients Stated Pain Goal: 0 (11/11/23 1630)  Complications: No notable events documented.

## 2023-11-12 NOTE — Anesthesia Preprocedure Evaluation (Addendum)
Anesthesia Evaluation  Patient identified by MRN, date of birth, ID band Patient awake    Reviewed: Allergy & Precautions, NPO status , Patient's Chart, lab work & pertinent test results  Airway Mallampati: II  TM Distance: >3 FB Neck ROM: Full    Dental no notable dental hx.    Pulmonary asthma , former smoker   Pulmonary exam normal        Cardiovascular hypertension, Pt. on medications and Pt. on home beta blockers + CAD, +CHF and + DVT  + dysrhythmias + Cardiac Defibrillator  Rhythm:Regular Rate:Normal  ECHO 2022:  1. Left ventricular ejection fraction, by estimation, is 50 to 55%. The  left ventricle has low normal function. The left ventricle has no regional  wall motion abnormalities. The left ventricular internal cavity size was  moderately dilated. There is mild   left ventricular hypertrophy. Left ventricular diastolic parameters are  consistent with Grade I diastolic dysfunction (impaired relaxation).   2. Right ventricular systolic function is normal. The right ventricular  size is normal. There is normal pulmonary artery systolic pressure. The  estimated right ventricular systolic pressure is 22.9 mmHg.   3. Right atrial size was mildly dilated.   4. The mitral valve is grossly normal. Trivial mitral valve  regurgitation.   5. The aortic valve is tricuspid. Aortic valve regurgitation is trivial.  No aortic stenosis is present.   6. Aortic dilatation noted. There is mild dilatation of the aortic root,  measuring 40 mm.   7. The inferior vena cava is normal in size with greater than 50%  respiratory variability, suggesting right atrial pressure of 3 mmHg.   8. Cannot exclude small PFO by color doppler.   Comparison(s): Prior images unable to be directly viewed, comparison made  by report only. Changes from prior study are noted. 09/06/2014: LVEF  20-25%.     Neuro/Psych negative neurological ROS  negative  psych ROS   GI/Hepatic Neg liver ROS, PUD,GERD  ,,  Endo/Other  diabetes, Type 2, Oral Hypoglycemic AgentsHypothyroidism    Renal/GU   negative genitourinary   Musculoskeletal  (+) Arthritis ,    Abdominal Normal abdominal exam  (+)   Peds  Hematology  (+) Blood dyscrasia, anemia Lab Results      Component                Value               Date                      WBC                      6.9                 11/12/2023                HGB                      11.4 (L)            11/12/2023                HCT                      32.8 (L)            11/12/2023                MCV  89.1                11/12/2023                PLT                      172                 11/12/2023             Lab Results      Component                Value               Date                      NA                       134 (L)             11/12/2023                K                        3.8                 11/12/2023                CO2                      23                  11/12/2023                GLUCOSE                  182 (H)             11/12/2023                BUN                      18                  11/12/2023                CREATININE               1.13                11/12/2023                CALCIUM                  8.5 (L)             11/12/2023                GFR                      70.88               01/14/2023                EGFR                     67  05/06/2022                GFRNONAA                 >60                 11/12/2023              Anesthesia Other Findings   Reproductive/Obstetrics                             Anesthesia Physical Anesthesia Plan  ASA: 3  Anesthesia Plan: MAC   Post-op Pain Management:    Induction: Intravenous  PONV Risk Score and Plan: 1 and Ondansetron, Dexamethasone, Propofol infusion and Treatment may vary due to age or medical condition  Airway Management  Planned: Simple Face Mask and Nasal Cannula  Additional Equipment: None  Intra-op Plan:   Post-operative Plan:   Informed Consent: I have reviewed the patients History and Physical, chart, labs and discussed the procedure including the risks, benefits and alternatives for the proposed anesthesia with the patient or authorized representative who has indicated his/her understanding and acceptance.     Dental advisory given  Plan Discussed with: CRNA  Anesthesia Plan Comments:        Anesthesia Quick Evaluation

## 2023-11-12 NOTE — Progress Notes (Signed)
Patient ID: Mike Jimenez, male   DOB: 10/28/1951, 72 y.o.   MRN: [id number]  Doing well post procedure. Reviewed details with patient and his daughter. Recommend ASA 81mg  PO every day indefinitely. Recommend DOAC x 12 months. OK to mobilize. OK to discharge this afternoon if feeling confident moving independently. Will need to remove stitch in clinic if he leaves later today. Encouraged him to call my office Monday should this occur. Otherwise I will see him tomorrow AM.   Rande Brunt. Lenell Antu, MD [facility] Phone Number: [phone] 11/12/2023 11:49 AM

## 2023-11-12 NOTE — Progress Notes (Signed)
PROGRESS NOTE  Mike Jimenez  DOB: 09-14-1951  PCP: Wanda Plump, MD WUJ:[id number]  DOA: 11/09/2023  LOS: 1 day  Hospital Day: 4  Brief narrative: Mike Jimenez is a 72 y.o. male with PMH significant for obesity, DM2, HTN, HLD, CAD, CHF, V. tach, nonischemic cardiomyopathy s/p BiV PPM, chronic anemia, gastric ulcer, neuropathy, history of thyroid cancer s/p thyroidectomy 2013. 12/11, patient was seen by his PCP for 1 week history of left leg swelling.  DVT was suspected and patient was sent to ED at [facility].  In the ED, CT venogram showed extensive left lower extremity DVT CT PE was negative for acute pulm embolism EDP discussed with vascular surgery Dr. Myra Gianotti Recommended to start IV heparin drip and admission to [facility] for possible need of thrombectomy.  Subjective: Patient was seen and examined this morning. Propped up in bed.  Taking his lunch.  Family at bedside. Underwent angioplasty and stenting of left common and external iliac veins this morning.  Assessment and plan: Extensive acute left lower extremity DVT S/p mechanical thrombectomy 12/13-Dr. Lenell Antu S/p angioplasty and stenting of left common and external iliac veins -12/14-Dr. Lenell Antu Presented with left leg swelling.   Ultrasound duplex as above. CTPA negative for pulm embolism Procedures as above Currently on heparin drip. Plan to switch to DOAC prior to discharge.  Need to do benefit check Per vascular surgery, recommend DOAC for 12 months  Chronic HFpEF Essential hypertension PTA meds- Coreg 3.125 mg twice daily, Lasix 20 mg daily, losartan 50 mg at bedtime Currently continued on Coreg and losartan.  Lasix remains on hold.  Blood pressure in 150s this morning.  I will increase Coreg from daily to twice daily.  CAD, HLD Denies any anginal symptoms PTA meds- aspirin 81 mg daily, Lipitor 40 mg daily Continue both   H/o thyroid cancer s/p thyroidectomy-2013 Continue Synthroid  Obesity   Body mass index is 31.36 kg/m. Patient has been advised to make an attempt to improve diet and exercise patterns to aid in weight loss.   Mobility: Okay to ambulate postprocedure per vascular surgery  Goals of care   Code Status: Full Code     DVT prophylaxis: Heparin drip apixaban (ELIQUIS) tablet 5 mg   Antimicrobials: None Fluid: None Consultants: Vascular surgery Family Communication: None at bedside.  Status: Observation Level of care:  Progressive   Patient is from: Home.  Lives with his wife and 11 dogs Needs to continue in-hospital care: Underwent venous stenting today.  Encourage ambulation. Anticipated d/c to: Discharge this afternoon versus tomorrow      Diet:  Diet Order             Diet regular Room service appropriate? Yes; Fluid consistency: Thin  Diet effective now                   Scheduled Meds:  apixaban  5 mg Oral BID   aspirin EC  81 mg Oral Daily   atorvastatin  40 mg Oral Daily   carvedilol  3.125 mg Oral BID WC   chlorhexidine       Chlorhexidine Gluconate Cloth  6 each Topical Daily   levothyroxine  168 mcg Oral Q0600   losartan  50 mg Oral Daily   sodium chloride flush  3 mL Intravenous Q12H    PRN meds: sodium chloride, acetaminophen, chlorhexidine, hydrALAZINE, melatonin, midazolam, morphine injection, oxyCODONE, polyethylene glycol, prochlorperazine, sodium chloride flush   Infusions:   sodium chloride  Antimicrobials: Anti-infectives (From admission, onward)    None       Objective: Vitals:   11/12/23 0904 11/12/23 0915  BP: (!) 151/70 (!) 154/80  Pulse: 85 82  Resp: 18 20  Temp: 98.5 F (36.9 C)   SpO2: 93% 92%    Intake/Output Summary (Last 24 hours) at 11/12/2023 1153 Last data filed at 11/12/2023 0853 Gross per 24 hour  Intake 1533.26 ml  Output 415 ml  Net 1118.26 ml   Filed Weights   11/09/23 1553  Weight: 110.8 kg   Weight change:  Body mass index is 31.36 kg/m.   Physical  Exam: General exam: Pleasant, elderly Caucasian male.  Not in distress Skin: No rashes, lesions or ulcers. HEENT: Atraumatic, normocephalic, no obvious bleeding Lungs: Clear to auscultation bilaterally CVS: Regular rate and rhythm, no murmur GI/Abd soft, nontender, nondistended, bowel sound present CNS: Alert, awake, oriented x 3 Psychiatry: Mood appropriate Extremities: Left leg has compression bandages on.    Data Review: I have personally reviewed the laboratory data and studies available.  F/u labs ordered Unresulted Labs (From admission, onward)     Start     Ordered   11/12/23 1100  Fibrinogen  Once,   R        11/12/23 1100   11/11/23 1554  CBC every 6 hours x 4 post-procedure  Every 6 hours (unscheduled),   R      11/11/23 1553   11/11/23 0500  CBC with Differential/Platelet  Daily,   R      11/10/23 1355   11/11/23 0500  Basic metabolic panel  Daily,   R      11/10/23 1355            Total time spent in review of labs and imaging, patient evaluation, formulation of plan, documentation and communication with family: 45 minutes  Signed, Lorin Glass, MD [facility] 11/12/2023

## 2023-11-12 NOTE — Anesthesia Postprocedure Evaluation (Signed)
Anesthesia Post Note  Patient: Mike Jimenez  Procedure(s) Performed: LYSIS CATHETER RECHECK (Pelvis) INSERTION OF LEFT ILIAC VEIN STENT (Left: Pelvis)     Patient location during evaluation: PACU Anesthesia Type: MAC Level of consciousness: awake and alert Pain management: pain level controlled Vital Signs Assessment: post-procedure vital signs reviewed and stable Respiratory status: spontaneous breathing, nonlabored ventilation, respiratory function stable and patient connected to nasal cannula oxygen Cardiovascular status: stable and blood pressure returned to baseline Postop Assessment: no apparent nausea or vomiting Anesthetic complications: no   No notable events documented.  Last Vitals:  Vitals:   11/12/23 0904 11/12/23 0915  BP: (!) 151/70 (!) 154/80  Pulse: 85 82  Resp: 18 20  Temp: 36.9 C   SpO2: 93% 92%    Last Pain:  Vitals:   11/12/23 0904  TempSrc:   PainSc: 0-No pain                 Nelle Don Zayleigh Stroh

## 2023-11-12 NOTE — Op Note (Signed)
DATE OF SERVICE: 11/12/2023  PATIENT:  Mike Jimenez  72 y.o. 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  TOURNIQUET:  none  PATIENT DISPOSITION:  PACU - hemodynamically stable.   Delay start of Pharmacological VTE agent (>24hrs) due to surgical blood loss or risk of bleeding: no  INDICATION FOR PROCEDURE: Mike Jimenez is a 72 y.o. 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 After careful discussion of risks, benefits, and alternatives the patient was offered catheter recheck. The patient understood and wished to proceed.  OPERATIVE FINDINGS:  Significant improvement in appearance of intravascular ultrasound and venogram. 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 The native peripheral left common iliac vein was measured using intravascular ultrasound.  A stent was selected that was a size above this diameter and long enough to prevent stent migration.Peri Jefferson technical result achieved from angioplasty and stenting of left common and external iliac veins with significant improvement in contrast movement through the segment.  DESCRIPTION OF PROCEDURE: After identification of the patient in the pre-operative holding area, the  patient was transferred to the operating room. The patient was positioned prone on the operating room table. Anesthesia was induced. The left popliteal space was prepped and draped in standard fashion. A surgical pause was performed confirming correct patient, procedure, and operative location.  The existing thrombolysis catheter was removed over a Glidewire advantage.  Patient was systemically heparinized.  Intravascular ultrasound was performed through the inferior vena cava, left iliac veins, left femoral veins.  This showed marked improvement in clot burden.  There is significant scarring still present in the left iliac veins and the left common femoral vein  Venogram was then performed.  This showed good contrast passage through the femoral veins.  There is significant stenosis in the left iliac veins.  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  Intravascular ultrasound was used to measure the native common iliac vein in the peripheral aspect of the vein.  An 18 x 150 mm Abre stent was selected and brought onto the field.  This was positioned into the inferior vena cava to adequately treat the May-Thurner lesion.  The stent was deployed without event.  The stent was angioplastied.  Significant waist was seen in the May-Thurner lesion.  The stent was opposed to the native wall.  Follow-up intravascular ultrasound showed good technical result with significant luminal gain throughout the stented segment.  Follow-up venogram showed improved flow of contrast through the leg and pelvis.  Satisfied end of the case here.  All endovascular equipment was removed.  A figure-of-eight nylon stitch was applied to the access site with good hemostasis.  Upon completion of the case instrument and  sharps counts were confirmed correct. The patient was transferred to the PACU in good condition. I was present for all  portions of the procedure.  FOLLOW UP PLAN: Assuming a normal postoperative course, I will see the patient in 4 weeks with no studies.   Rande Brunt. Lenell Antu, MD [facility] of [facility] Phone Number: [phone] 11/12/2023 8:53 AM

## 2023-11-12 NOTE — Progress Notes (Signed)
ANTICOAGULATION CONSULT NOTE   Pharmacy Consult for Heparin Indication: LLE DVT   Allergies  Allergen Reactions   Bee Venom Swelling   Amiodarone Other (See Comments)   Ramipril Cough    Patient Measurements: Height: 6\' 2"  (188 cm) Weight: 110.8 kg (244 lb 4.3 oz) IBW/kg (Calculated) : 82.2 Heparin Dosing Weight: 105 kg  Vital Signs: Temp: 98.7 F (37.1 C) (12/13 2021) Temp Source: Oral (12/13 2021) BP: 134/73 (12/14 0400) Pulse Rate: 75 (12/14 0400)  Labs: Recent Labs    11/09/23 1556 11/10/23 0614 11/10/23 1328 11/11/23 0738 11/11/23 1703 11/11/23 2213 11/12/23 0402  HGB 13.0 12.3*  --  12.9* 12.6* 11.7* 11.4*  HCT 38.2* 35.3*  --  37.9* 37.0* 34.5* 32.8*  PLT 247 213  --  217 245 199 172  HEPARINUNFRC  --  0.56   < > 0.57 0.62 <0.10* <0.10*  CREATININE 1.09 1.15  --  1.27*  --   --  1.13  TROPONINIHS 9  --   --   --   --   --   --    < > = values in this interval not displayed.    Estimated Creatinine Clearance: 79.4 mL/min (by C-G formula based on SCr of 1.13 mg/dL).   Assessment: 72 yo M admitted with new LLE DVT. No anticoagulation prior to admission. Pharmacy consulted for heparin.     Heparin level 0.57 (therapeutic) pre-procedure at 1750 units/hr this morning.   S/p mechanical thrombectomy and initiation of cath-directed thrombolysis alteplase 12/13. 10,000 units heparin given in cath lab ~1400. Heparin restarted at 800 units/hr post procedure with pharmacy to adjust. There is serosanguinous leaking from the catheter site due to size difference between the catheter and sheath - possible that some of heparin is leaking here - Dr. Lenell Antu aware of this  12/14 AM: heparin level returned undetectable on 1200 units/hr. Per RN, no issues with the heparin infusion running continuously or signs/symptoms of bleeding. RN made aware patient still having leakage due to catheter and sheath size mismatch and possibly leaking heparin. Hgb low, stable and plts 199  >172.  Goal of Therapy:  Heparin level 0.2-0.5 units/ml Monitor platelets by anticoagulation protocol: Yes   Plan:  Altepase 1mg /hr via catheter Increase heparin infusion to 1500 units/hr  Will f/u 6 hr heparin level F/u long term anticoagulation plan  Arabella Merles, PharmD. Clinical Pharmacist 11/12/2023 5:08 AM

## 2023-11-12 NOTE — Progress Notes (Signed)
[facility] OF [place] PROGRESS NOTE  ASSESSMENT / PLAN: Mike Jimenez is a 71 y.o. male with ongoing catheter directed thrombolysis for left leg deep venous thrombosis. Plan catheter recheck in the OR today.   SUBJECTIVE: No complaints. Reviewed plan for OR today.   OBJECTIVE: BP (!) 156/81   Pulse 80   Temp 98 F (36.7 C) (Oral)   Resp 20   Ht 6\' 2"  (1.88 m)   Wt 110.8 kg   SpO2 96%   BMI 31.36 kg/m   Intake/Output Summary (Last 24 hours) at 11/12/2023 0747 Last data filed at 11/12/2023 0600 Gross per 24 hour  Intake 1233.26 ml  Output 400 ml  Net 833.26 ml    Well appearing in no distress Left leg mildly edematous Drainage from sheath access     Latest Ref Rng & Units 11/12/2023    4:02 AM 11/11/2023   10:13 PM 11/11/2023    5:03 PM  CBC  WBC 4.0 - 10.5 K/uL 6.9  7.4  8.1   Hemoglobin 13.0 - 17.0 g/dL 16.1  09.6  04.5   Hematocrit 39.0 - 52.0 % 32.8  34.5  37.0   Platelets 150 - 400 K/uL 172  199  245         Latest Ref Rng & Units 11/12/2023    4:02 AM 11/11/2023    7:38 AM 11/10/2023    6:14 AM  CMP  Glucose 70 - 99 mg/dL 409  811  914   BUN 8 - 23 mg/dL 18  21  22    Creatinine 0.61 - 1.24 mg/dL 7.82  9.56  2.13   Sodium 135 - 145 mmol/L 134  139  138   Potassium 3.5 - 5.1 mmol/L 3.8  4.0  3.8   Chloride 98 - 111 mmol/L 107  105  101   CO2 22 - 32 mmol/L 23  24  24    Calcium 8.9 - 10.3 mg/dL 8.5  9.3  9.3   Total Protein 6.5 - 8.1 g/dL   6.6   Total Bilirubin <1.2 mg/dL   1.1   Alkaline Phos 38 - 126 U/L   73   AST 15 - 41 U/L   17   ALT 0 - 44 U/L   18     Estimated Creatinine Clearance: 79.4 mL/min (by C-G formula based on SCr of 1.13 mg/dL).  Rande Brunt. Lenell Antu, MD [facility] of [facility] Phone Number: [phone] 11/12/2023 7:47 AM

## 2023-11-12 NOTE — Plan of Care (Signed)

## 2023-11-12 NOTE — Interval H&P Note (Signed)
History and Physical Interval Note:  11/12/2023 7:47 AM  Mike Jimenez  has presented today for surgery, with the diagnosis of DVT.  The various methods of treatment have been discussed with the patient and family. After consideration of risks, benefits and other options for treatment, the patient has consented to  Procedure(s): LYSIS CATHETER RECHECK (N/A) as a surgical intervention.  The patient's history has been reviewed, patient examined, no change in status, stable for surgery.  I have reviewed the patient's chart and labs.  Questions were answered to the patient's satisfaction.     [name]

## 2023-11-12 NOTE — Discharge Instructions (Signed)
Recommendations at discharge:  You been started on Eliquis which is a blood thinner.  Please watch out for any obvious bleeding, excessive bruisability or black stool.  Continue aspirin with Eliquis.  You are expected to be on Eliquis for 12 months Follow-up with PCP as an outpatient for repeat hemoglobin and renal function As recommended by vascular surgery, put on compression stocking first thing in the morning before getting out of bed and take it off at night.    General discharge instructions: Follow with Primary MD Wanda Plump, MD in 7 days  Please request your PCP  to go over your hospital tests, procedures, radiology results at the follow up. Please get your medicines reviewed and adjusted.  Your PCP may decide to repeat certain labs or tests as needed. Do not drive, operate heavy machinery, perform activities at heights, swimming or participation in water activities or provide baby sitting services if your were admitted for syncope or siezures until you have seen by Primary MD or a Neurologist and advised to do so again. [place] Controlled Substance Reporting System database was reviewed. Do not drive, operate heavy machinery, perform activities at heights, swim, participate in water activities or provide baby-sitting services while on medications for pain, sleep and mood until your outpatient physician has reevaluated you and advised to do so again.  You are strongly recommended to comply with the dose, frequency and duration of prescribed medications. Activity: As tolerated with Full fall precautions use walker/cane & assistance as needed Avoid using any recreational substances like cigarette, tobacco, alcohol, or non-prescribed drug. If you experience worsening of your admission symptoms, develop shortness of breath, life threatening emergency, suicidal or homicidal thoughts you must seek medical attention immediately by calling 911 or calling your MD immediately  if symptoms less  severe. You must read complete instructions/literature along with all the possible adverse reactions/side effects for all the medicines you take and that have been prescribed to you. Take any new medicine only after you have completely understood and accepted all the possible adverse reactions/side effects.  Wear Seat belts while driving. You were cared for by a hospitalist during your hospital stay. If you have any questions about your discharge medications or the care you received while you were in the hospital after you are discharged, you can call the unit and ask to speak with the hospitalist or the covering physician. Once you are discharged, your primary care physician will handle any further medical issues. Please note that NO REFILLS for any discharge medications will be authorized once you are discharged, as it is imperative that you return to your primary care physician (or establish a relationship with a primary care physician if you do not have one).     Information on my medicine - ELIQUIS (apixaban)  Why was Eliquis prescribed for you? Eliquis was prescribed to treat blood clots that may have been found in the veins of your legs (deep vein thrombosis) or in your lungs (pulmonary embolism) and to reduce the risk of them occurring again.  What do You need to know about Eliquis ? The dose is ONE 5 mg tablet taken TWICE daily.  Eliquis may be taken with or without food.   Try to take the dose about the same time in the morning and in the evening. If you have difficulty swallowing the tablet whole please discuss with your pharmacist how to take the medication safely.  Take Eliquis exactly as prescribed and DO NOT stop taking Eliquis  without talking to the doctor who prescribed the medication.  Stopping may increase your risk of developing a new blood clot.  Refill your prescription before you run out.  After discharge, you should have regular check-up appointments with your  healthcare provider that is prescribing your Eliquis.    What do you do if you miss a dose? If a dose of ELIQUIS is not taken at the scheduled time, take it as soon as possible on the same day and twice-daily administration should be resumed. The dose should not be doubled to make up for a missed dose.  Important Safety Information A possible side effect of Eliquis is bleeding. You should call your healthcare provider right away if you experience any of the following: Bleeding from an injury or your nose that does not stop. Unusual colored urine (red or dark brown) or unusual colored stools (red or black). Unusual bruising for unknown reasons. A serious fall or if you hit your head (even if there is no bleeding).  Some medicines may interact with Eliquis and might increase your risk of bleeding or clotting while on Eliquis. To help avoid this, consult your healthcare provider or pharmacist prior to using any new prescription or non-prescription medications, including herbals, vitamins, non-steroidal anti-inflammatory drugs (NSAIDs) and supplements.  This website has more information on Eliquis (apixaban): http://www.eliquis.com/eliquis/home      [facility] Specialists of [facility]   Discharge Instructions   Lower Extremity Procedure   Please refer to the following instructions for your post-procedure care. Your surgeon or physician assistant will discuss any changes with you.   Activity   Avoid lifting more than 8 pounds (1 gallons of milk) for 72 hours (3 days) after your procedure. You may walk as much as you can tolerate. It's okay to drive after 72 hours.   Put your compression stockings on before you get out of bed in the morning as they will be easier to put on.  Remove them at night when going to bed.     Bathing/Showering   You may shower the day after your procedure. If you have a bandage, you may remove it at 24- 48 hours. Clean your incision site with mild  soap and water. Pat the area dry with a clean towel.   Diet   Resume your pre-procedure diet. There are no special food restrictions following this procedure. All patients with peripheral vascular disease should follow a low fat/low cholesterol diet. In order to heal from your surgery, it is CRITICAL to get adequate nutrition. Your body requires vitamins, minerals, and protein. Vegetables are the best source of vitamins and minerals. Vegetables also provide the perfect balance of protein. Processed food has little nutritional value, so try to avoid this.   Medications   Resume taking all of your medications unless your doctor tells you not to. If your incision is causing pain, you may take over-the-counter pain relievers such as acetaminophen (Tylenol).  Take your Eliquis as directed.     Follow Up   Follow up will be arranged at the time of your procedure. You may have an office visit scheduled or may be scheduled for surgery. Ask your surgeon if you have any questions.   Please call us immediately for any of the following conditions: Severe or worsening pain your legs or feet at rest or with walking. Increased pain, redness, drainage at your groin puncture site. Fever of 101 degrees or higher. If you have any mild or slow bleeding from  your puncture site: lie down, apply firm constant pressure over the area with a piece of gauze or a clean wash cloth for 30 minutes- no peeking!   Reduce your risk factors of vascular disease:   Stop smoking. If you would like help call [facility] at 1-800-QUIT-NOW ((862) 295-4818) or [place] at 279-253-3420. Manage your cholesterol Maintain a desired weight Control your diabetes Keep your blood pressure down   If you have any questions, please call the office at [phone]

## 2023-11-12 NOTE — Progress Notes (Signed)
Notified Pharmacist Heparin was stopped in pre-op.

## 2023-11-12 NOTE — Plan of Care (Signed)

## 2023-11-13 ENCOUNTER — Encounter (HOSPITAL_COMMUNITY): Payer: Self-pay | Admitting: Vascular Surgery

## 2023-11-13 DIAGNOSIS — I824Y2 Acute embolism and thrombosis of unspecified deep veins of left proximal lower extremity: Secondary | ICD-10-CM | POA: Diagnosis not present

## 2023-11-13 LAB — CBC WITH DIFFERENTIAL/PLATELET
Abs Immature Granulocytes: 0.03 10*3/uL (ref 0.00–0.07)
Basophils Absolute: 0 10*3/uL (ref 0.0–0.1)
Basophils Relative: 0 %
Eosinophils Absolute: 0.3 10*3/uL (ref 0.0–0.5)
Eosinophils Relative: 5 %
HCT: 32.3 % — ABNORMAL LOW (ref 39.0–52.0)
Hemoglobin: 10.8 g/dL — ABNORMAL LOW (ref 13.0–17.0)
Immature Granulocytes: 1 %
Lymphocytes Relative: 15 %
Lymphs Abs: 0.8 10*3/uL (ref 0.7–4.0)
MCH: 30.3 pg (ref 26.0–34.0)
MCHC: 33.4 g/dL (ref 30.0–36.0)
MCV: 90.7 fL (ref 80.0–100.0)
Monocytes Absolute: 0.9 10*3/uL (ref 0.1–1.0)
Monocytes Relative: 16 %
Neutro Abs: 3.6 10*3/uL (ref 1.7–7.7)
Neutrophils Relative %: 63 %
Platelets: 173 10*3/uL (ref 150–400)
RBC: 3.56 MIL/uL — ABNORMAL LOW (ref 4.22–5.81)
RDW: 13.1 % (ref 11.5–15.5)
WBC: 5.7 10*3/uL (ref 4.0–10.5)
nRBC: 0 % (ref 0.0–0.2)

## 2023-11-13 LAB — BASIC METABOLIC PANEL
Anion gap: 8 (ref 5–15)
BUN: 19 mg/dL (ref 8–23)
CO2: 23 mmol/L (ref 22–32)
Calcium: 8.5 mg/dL — ABNORMAL LOW (ref 8.9–10.3)
Chloride: 106 mmol/L (ref 98–111)
Creatinine, Ser: 1.28 mg/dL — ABNORMAL HIGH (ref 0.61–1.24)
GFR, Estimated: 60 mL/min — ABNORMAL LOW (ref >60)
Glucose, Bld: 182 mg/dL — ABNORMAL HIGH (ref 70–99)
Potassium: 4.2 mmol/L (ref 3.5–5.1)
Sodium: 137 mmol/L (ref 135–145)

## 2023-11-13 MED ORDER — OXYCODONE HCL 5 MG PO TABS: 5.0000 mg | ORAL_TABLET | Freq: Four times a day (QID) | ORAL | 0 refills | Status: DC | PRN

## 2023-11-13 MED ORDER — CARVEDILOL 12.5 MG PO TABS: 12.5000 mg | ORAL_TABLET | Freq: Every day | ORAL | Status: DC

## 2023-11-13 MED ORDER — APIXABAN (ELIQUIS) VTE STARTER PACK (10MG AND 5MG): ORAL_TABLET | ORAL | 0 refills | Status: DC

## 2023-11-13 NOTE — Progress Notes (Addendum)
Progress Note    11/13/2023 7:10 AM 1 Day Post-Op  Subjective:  leg feels better this morning.    afebrile  Vitals:   11/12/23 2331 11/13/23 0320  BP: (!) 154/72 (!) 152/71  Pulse: 84 77  Resp: 19 16  Temp: 97.8 F (36.6 C) 97.9 F (36.6 C)  SpO2: 94% 93%    Physical Exam: General:  no distress Cardiac:  regular Lungs:  non labored Incisions:  suture removed this am.  No active bleeding Extremities:  palpable left DP pulse.  Ace wrap removed.  + LLE edema but subjectively improved    CBC    Component Value Date/Time   WBC 5.7 11/13/2023 0339   RBC 3.56 (L) 11/13/2023 0339   HGB 10.8 (L) 11/13/2023 0339   HGB 15.0 05/06/2022 1206   HCT 32.3 (L) 11/13/2023 0339   HCT 43.2 05/06/2022 1206   PLT 173 11/13/2023 0339   PLT 288 05/06/2022 1206   MCV 90.7 11/13/2023 0339   MCV 89 05/06/2022 1206   MCH 30.3 11/13/2023 0339   MCHC 33.4 11/13/2023 0339   RDW 13.1 11/13/2023 0339   RDW 13.1 05/06/2022 1206   LYMPHSABS 0.8 11/13/2023 0339   MONOABS 0.9 11/13/2023 0339   EOSABS 0.3 11/13/2023 0339   BASOSABS 0.0 11/13/2023 0339    BMET    Component Value Date/Time   NA 137 11/13/2023 0339   NA 145 (H) 05/06/2022 1206   K 4.2 11/13/2023 0339   CL 106 11/13/2023 0339   CO2 23 11/13/2023 0339   GLUCOSE 182 (H) 11/13/2023 0339   BUN 19 11/13/2023 0339   BUN 20 05/06/2022 1206   CREATININE 1.28 (H) 11/13/2023 0339   CREATININE 1.29 (H) 05/14/2016 0944   CALCIUM 8.5 (L) 11/13/2023 0339   GFRNONAA 60 (L) 11/13/2023 0339   GFRNONAA 81 03/07/2016 1346   GFRAA 77 10/16/2020 0000   GFRAA >89 03/07/2016 1346    INR    Component Value Date/Time   INR 1.23 09/06/2014 0343     Intake/Output Summary (Last 24 hours) at 11/13/2023 0710 Last data filed at 11/12/2023 2042 Gross per 24 hour  Intake 780 ml  Output 815 ml  Net -35 ml      Assessment/Plan:  72 y.o. male is s/p:  Endovascular mechanical thrombectomy and initiation of thrombolysis for LLE DVT  11/11/2023 and  lysis recheck with angioplasty and stenting of left CIV and EIA 11/12/2023 both by Dr. Lenell Antu. 1 Day Post-Op   -pt doing well this morning.  Ace wrap removed and suture taken out.  Will order thigh high 20-30 mmHg compression stocking.  Discussed putting this on in the morning before getting out of bed and taking off at night.  Also discussed proper leg elevation when he is not up and about.   -DVT prophylaxis:  Eliquis for 12 months with asa then asa daily -creatinine 1.28 today-up from yesterday but stable from 12/13.  Discussed following up with his PCP next week to recheck his kidney function.  He states PCP wants to see him after discharge.  -acute blood loss anemia with hgb 10.8 today down from 12.6 yesterday.  Tolerating -f/u with Dr. Lenell Antu in a few weeks.  Our office will arrange appt.   -pt uses [facility] on [address]   Doreatha Massed, [place] [facility] [phone] 11/13/2023 7:10 AM  VASCULAR STAFF ADDENDUM: I have independently interviewed and examined the patient. I agree with the above.   Rande Brunt. Lenell Antu,  MD FACS [facility] of [facility] Phone Number: [phone] 11/13/2023 10:32 AM

## 2023-11-13 NOTE — Progress Notes (Signed)
Patient discharged, discharge instructions provided, IV removed, CCMD notified, belongings given

## 2023-11-13 NOTE — Discharge Summary (Addendum)
Physician Discharge Summary  Mike Jimenez:[id number] DOB: 1951/07/18 DOA: 11/09/2023  PCP: Wanda Plump, MD  Admit date: 11/09/2023 Discharge date: 11/13/2023  Admitted From: Home Discharge disposition: Home  Recommendations at discharge:  You been started on Eliquis which is a blood thinner.  Please watch out for any obvious bleeding, excessive bruisability or black stool.  Continue aspirin with Eliquis.  You are expected to be on Eliquis for 12 months Follow-up with PCP as an outpatient for repeat hemoglobin and renal function As recommended by vascular surgery, put on compression stocking first thing in the morning before getting out of bed and take it off at night.    Brief narrative: Mike Jimenez is a 72 y.o. male with PMH significant for obesity, DM2, HTN, HLD, CAD, CHF, V. tach, nonischemic cardiomyopathy s/p BiV PPM, chronic anemia, gastric ulcer, neuropathy, history of thyroid cancer s/p thyroidectomy 2013. 12/11, patient was seen by his PCP for 1 week history of left leg swelling.  DVT was suspected and patient was sent to ED at [facility].  In the ED, CT venogram showed extensive left lower extremity DVT CT PE was negative for acute pulm embolism EDP discussed with vascular surgery Dr. Myra Gianotti Recommended to start IV heparin drip and admission to [facility] for possible need of thrombectomy. See below for details  Subjective: Patient was seen and examined this morning.  Sitting up at the edge of the bed.  Not in distress.  No new symptoms.  Family not at bedside. Feels ready for discharge today.  Assessment and plan: Extensive acute left lower extremity DVT S/p mechanical thrombectomy 12/13-Dr. Lenell Antu S/p angioplasty and stenting of left common and external iliac veins -12/14-Dr. Lenell Antu Presented with left leg swelling.   Ultrasound duplex as above. CTPA negative for pulm embolism Procedures as above Initially on heparin drip.  Later switched to  Eliquis. Per vascular surgery, recommend DOAC for 12 months.  To continue with aspirin 81 mg daily that he was taking before Per vascular surgery recommendation, patient should also put compression stocking first thing in the morning before getting out of bed and take it off at night.    Chronic HFpEF Essential hypertension PTA meds- Coreg 3.125 mg twice daily, Lasix 20 mg daily, losartan 50 mg at bedtime Continue same post discharge Creatinine slightly elevated today.  Follow-up with PCP as an outpatient  CAD, HLD Denies any anginal symptoms PTA meds- aspirin 81 mg daily, Lipitor 40 mg daily Continue both   H/o thyroid cancer s/p thyroidectomy-2013 Continue Synthroid  Obesity  Body mass index is 31.36 kg/m. Patient has been advised to make an attempt to improve diet and exercise patterns to aid in weight loss.   Mobility: Okay to ambulate postprocedure per vascular surgery  Goals of care   Code Status: Full Code   Diet:  Diet Order             Diet general           Diet regular Room service appropriate? Yes; Fluid consistency: Thin  Diet effective now                   Nutritional status:  Body mass index is 31.36 kg/m.       Wounds:  - Incision (Closed) 11/12/23 Knee Left (Active)  Date First Assessed/Time First Assessed: 11/12/23 0853   Location: Knee  Location Orientation: Left    Assessments 11/12/2023  9:04 AM 11/13/2023  9:15 AM  Dressing Type Compression wrap Compression wrap  Dressing Clean, Dry, Intact Clean, Dry, Intact  Site / Wound Assessment Dressing in place / Unable to assess Dressing in place / Unable to assess  Drainage Amount None None     No associated orders.    Discharge Exam:   Vitals:   11/12/23 2331 11/13/23 0320 11/13/23 0825 11/13/23 0933  BP: (!) 154/72 (!) 152/71 (!) 162/66 (!) 162/66  Pulse: 84 77 88 84  Resp: 19 16 20    Temp: 97.8 F (36.6 C) 97.9 F (36.6 C) 98.1 F (36.7 C)   TempSrc: Oral Oral Oral   SpO2:  94% 93% 96%   Weight:      Height:        Body mass index is 31.36 kg/m.   General exam: Pleasant, elderly Caucasian male.  Not in distress. Skin: No rashes, lesions or ulcers. HEENT: Atraumatic, normocephalic, no obvious bleeding Lungs: Clear to auscultation bilaterally CVS: Regular rate and rhythm, no murmur GI/Abd soft, nontender, nondistended, bowel sound present CNS: Alert, awake, oriented x 3 Psychiatry: Mood appropriate Extremities: Left leg swelling gradually improving.  Follow ups:    Follow-up Information     [name], MD Follow up in 4 week(s).   Specialties: Vascular Surgery, Interventional Cardiology Why: Office will call you to arrange your appt (sent). Contact information: [address] [place] [address] [phone]         Wanda Plump, MD Follow up.   Specialty: Internal Medicine Contact information: [address] [place] [address] [phone]                 Discharge Instructions:   Discharge Instructions     Call MD for:  difficulty breathing, headache or visual disturbances   Complete by: As directed    Call MD for:  extreme fatigue   Complete by: As directed    Call MD for:  hives   Complete by: As directed    Call MD for:  persistant dizziness or light-headedness   Complete by: As directed    Call MD for:  persistant nausea and vomiting   Complete by: As directed    Call MD for:  severe uncontrolled pain   Complete by: As directed    Call MD for:  temperature >100.4   Complete by: As directed    Diet general   Complete by: As directed    Increase activity slowly   Complete by: As directed        Discharge Medications:   Allergies as of 11/13/2023       Reactions   Bee Venom Swelling   Amiodarone Other (See Comments)   Ramipril Cough        Medication List     STOP taking these medications    naproxen sodium 220 MG tablet Commonly known as: ALEVE       TAKE these  medications    acetaminophen 500 MG tablet Commonly known as: TYLENOL Take 1,000 mg by mouth every 6 (six) hours as needed (pain).   allopurinol 100 MG tablet Commonly known as: ZYLOPRIM Take 1 tablet (100 mg total) by mouth daily. What changed: when to take this   Apixaban Starter Pack (10mg  and 5mg ) Commonly known as: ELIQUIS STARTER PACK Take as directed on package: start with two-5mg  tablets twice daily for 7 days. On day 8, switch to one-5mg  tablet twice daily.   aspirin EC 81 MG tablet Take 1  tablet (81 mg total) by mouth daily.   atorvastatin 40 MG tablet Commonly known as: LIPITOR Take 1 tablet (40 mg total) by mouth daily. What changed: when to take this   carvedilol 12.5 MG tablet Commonly known as: COREG Take 1 tablet (12.5 mg total) by mouth daily.   furosemide 20 MG tablet Commonly known as: LASIX Take 1 tablet (20 mg total) by mouth daily.   levothyroxine 175 MCG tablet Commonly known as: SYNTHROID Take 175 mcg by mouth daily before breakfast.   losartan 50 MG tablet Commonly known as: COZAAR Take 1 tablet (50 mg total) by mouth daily. What changed: when to take this   metFORMIN 1000 MG tablet Commonly known as: GLUCOPHAGE Take 1 tablet (1,000 mg total) by mouth 2 (two) times daily with a meal.   multivitamin with minerals Tabs tablet Take 1 tablet by mouth daily.   OVER THE COUNTER MEDICATION Apply 1 application  topically at bedtime as needed (CBD Cream for neuropathy in feet).   oxyCODONE 5 MG immediate release tablet Commonly known as: Oxy IR/ROXICODONE Take 1 tablet (5 mg total) by mouth every 6 (six) hours as needed for up to 5 days for moderate pain (pain score 4-6) or breakthrough pain.   potassium chloride 10 MEQ tablet Commonly known as: Klor-Con M10 Take 1 tablet (10 mEq total) by mouth daily. What changed: when to take this   tadalafil 20 MG tablet Commonly known as: CIALIS Take 1 tablet (20 mg total) by mouth daily as needed for  erectile dysfunction.   vitamin E 180 MG (400 UNITS) capsule Take 400 Units by mouth daily.         The results of significant diagnostics from this hospitalization (including imaging, microbiology, ancillary and laboratory) are listed below for reference.    Procedures and Diagnostic Studies:   CT VENOGRAM ABD/PEL Result Date: 11/09/2023 CLINICAL DATA:  Leg deep vein thrombosis (DVT) suspected EXAM: CT VENOGRAM ABDOMEN AND PELVIS TECHNIQUE: Venographic phase images of the abdomen and pelvis were obtained following the administration of intravenous contrast. Multiplanar reformats and maximum intensity projections were generated. RADIATION DOSE REDUCTION: This exam was performed according to the departmental dose-optimization program which includes automated exposure control, adjustment of the mA and/or kV according to patient size and/or use of iterative reconstruction technique. CONTRAST:  OMNIPAQUE IOHEXOL 350 MG/ML SOLN COMPARISON:  None Available. FINDINGS: Lung bases: No acute findings. Hepatobiliary: Gallstones layering within the gallbladder. Diffuse low-density throughout the liver suggesting fatty infiltration. No focal hepatic abnormality. Pancreas: No focal abnormality or ductal dilatation. Spleen: No focal abnormality.  Normal size. Urinary tract: No adrenal abnormality. No focal renal abnormality. No stones or hydronephrosis. Urinary bladder is unremarkable. Stomach/bowel: Stomach, large and small bowel grossly unremarkable. Vascular/lymphatic: Aortic atherosclerosis. No evidence of aneurysm or adenopathy. Filling defect noted within the left external and internal iliac veins and left common femoral veins extending into the superficial and deep femoral veins. Reproductive: No visible focal abnormality. Other: No free fluid or free air. Musculoskeletal: No acute bony abnormality. IMPRESSION: Left deep venous thrombosis involving the left internal and external iliac veins, left  common femoral vein, and visualized superficial and deep femoral veins in the upper left thigh. Cholelithiasis. Hepatic steatosis Aortic atherosclerosis. Electronically Signed   By: Charlett Nose M.D.   On: 11/09/2023 19:26   CT Angio Chest PE W and/or Wo Contrast Result Date: 11/09/2023 CLINICAL DATA:  Left leg DVT. Pulmonary embolism (PE) suspected, high prob EXAM: CT ANGIOGRAPHY CHEST WITH  CONTRAST TECHNIQUE: Multidetector CT imaging of the chest was performed using the standard protocol during bolus administration of intravenous contrast. Multiplanar CT image reconstructions and MIPs were obtained to evaluate the vascular anatomy. RADIATION DOSE REDUCTION: This exam was performed according to the departmental dose-optimization program which includes automated exposure control, adjustment of the mA and/or kV according to patient size and/or use of iterative reconstruction technique. CONTRAST:  OMNIPAQUE IOHEXOL 350 MG/ML SOLN COMPARISON:  None Available. FINDINGS: Cardiovascular: No filling defects in the pulmonary arteries to suggest pulmonary emboli. Heart is normal size. Aorta is normal caliber. Pacer wires in the right heart. Scattered coronary artery and aortic calcifications. Mediastinum/Nodes: No mediastinal, hilar, or axillary adenopathy. Trachea and esophagus are unremarkable. Prior thyroidectomy. Lungs/Pleura: Lungs are clear. No focal airspace opacities or suspicious nodules. No effusions. Upper Abdomen: Layering stones within the gallbladder. No acute findings. Musculoskeletal: Chest wall soft tissues are unremarkable. No acute bony abnormality. Review of the MIP images confirms the above findings. IMPRESSION: No evidence of pulmonary embolus. No acute cardiopulmonary disease. Scattered coronary artery calcifications. Aortic Atherosclerosis (ICD10-I70.0). Cholelithiasis. Electronically Signed   By: Charlett Nose M.D.   On: 11/09/2023 19:15   US Venous Img Lower Unilateral Left Result Date:  11/09/2023 CLINICAL DATA:  Leg swelling EXAM: Left LOWER EXTREMITY VENOUS DOPPLER ULTRASOUND TECHNIQUE: Gray-scale sonography with compression, as well as color and duplex ultrasound, were performed to evaluate the deep venous system(s) from the level of the common femoral vein through the popliteal and proximal calf veins. COMPARISON:  None Available. FINDINGS: VENOUS Diffusely including the common femoral, femoral, popliteal vein there is poor compression and flow on color and spectral Doppler involving the vasculature consistent with the extensive DVT. Preserved appearance of the posterior tibial vein. OTHER None. Limitations: none Critical Value/emergent results were called by telephone at the time of interpretation on 11/09/2023 at 5:00 pm to provider Tanda Rockers , who verbally acknowledged these results. IMPRESSION: Extensive left lower extremity DVT Electronically Signed   By: Karen Kays M.D.   On: 11/09/2023 17:09     Labs:   Basic Metabolic Panel: Recent Labs  Lab 11/09/23 1556 11/10/23 0614 11/11/23 0738 11/12/23 0402 11/13/23 0339  NA 136 138 139 134* 137  K 4.1 3.8 4.0 3.8 4.2  CL 101 101 105 107 106  CO2 24 24 24 23 23   GLUCOSE 207* 169* 202* 182* 182*  BUN 24* 22 21 18 19   CREATININE 1.09 1.15 1.27* 1.13 1.28*  CALCIUM 9.0 9.3 9.3 8.5* 8.5*  MG  --  1.8  --   --   --   PHOS  --  3.4  --   --   --    GFR Estimated Creatinine Clearance: 70.1 mL/min (A) (by C-G formula based on SCr of 1.28 mg/dL (H)). Liver Function Tests: Recent Labs  Lab 11/09/23 1556 11/10/23 0614  AST 19 17  ALT 21 18  ALKPHOS 79 73  BILITOT 1.1 1.1  PROT 7.8 6.6  ALBUMIN 4.4 3.9   No results for input(s): "LIPASE", "AMYLASE" in the last 168 hours. No results for input(s): "AMMONIA" in the last 168 hours. Coagulation profile No results for input(s): "INR", "PROTIME" in the last 168 hours.  CBC: Recent Labs  Lab 11/09/23 1556 11/10/23 0614 11/11/23 0738 11/11/23 1703 11/11/23 2213  11/12/23 0402 11/12/23 1001 11/13/23 0339  WBC 6.6   < > 5.9 8.1 7.4 6.9 10.2 5.7  NEUTROABS 4.6  --  3.9  --   --  5.2  --  3.6  HGB 13.0   < > 12.9* 12.6* 11.7* 11.4* 12.6* 10.8*  HCT 38.2*   < > 37.9* 37.0* 34.5* 32.8* 37.2* 32.3*  MCV 89.7   < > 89.4 89.6 89.4 89.1 91.6 90.7  PLT 247   < > 217 245 199 172 173 173   < > = values in this interval not displayed.   Cardiac Enzymes: No results for input(s): "CKTOTAL", "CKMB", "CKMBINDEX", "TROPONINI" in the last 168 hours. BNP: Invalid input(s): "POCBNP" CBG: Recent Labs  Lab 11/09/23 2301 11/12/23 0710 11/12/23 0911  GLUCAP 193* 179* 168*   D-Dimer No results for input(s): "DDIMER" in the last 72 hours. Hgb A1c No results for input(s): "HGBA1C" in the last 72 hours. Lipid Profile No results for input(s): "CHOL", "HDL", "LDLCALC", "TRIG", "CHOLHDL", "LDLDIRECT" in the last 72 hours. Thyroid function studies No results for input(s): "TSH", "T4TOTAL", "T3FREE", "THYROIDAB" in the last 72 hours.  Invalid input(s): "FREET3" Anemia work up No results for input(s): "VITAMINB12", "FOLATE", "FERRITIN", "TIBC", "IRON", "RETICCTPCT" in the last 72 hours. Microbiology No results found for this or any previous visit (from the past 240 hours).  Time coordinating discharge: 45 minutes  Signed: Keela Rubert  [facility] 11/13/2023, 10:49 AM

## 2023-11-14 ENCOUNTER — Encounter (HOSPITAL_COMMUNITY): Payer: Self-pay | Admitting: Vascular Surgery

## 2023-11-14 ENCOUNTER — Telehealth: Payer: Self-pay

## 2023-11-14 NOTE — Transitions of Care (Post Inpatient/ED Visit) (Signed)
11/14/2023  Name: Mike Jimenez MRN: [id number] DOB: [birth year]  Today's TOC FU Call Status: Today's TOC FU Call Status:: Successful TOC FU Call Completed TOC FU Call Complete Date: 11/14/23 Patient's Name and Date of Birth confirmed.  Transition Care Management Follow-up Telephone Call Date of Discharge: 11/13/23 Discharge Facility: [facility]) Type of Discharge: Inpatient Admission Primary Inpatient Discharge Diagnosis:: "I am doing so much better - they took good care of me" How have you been since you were released from the hospital?: Better Any questions or concerns?: No  Items Reviewed: Did you receive and understand the discharge instructions provided?: Yes Medications obtained,verified, and reconciled?: Yes (Medications Reviewed) Any new allergies since your discharge?: No Dietary orders reviewed?: Yes Type of Diet Ordered:: Heart Healthy Do you have support at home?: Yes People in Home: spouse  Medications Reviewed Today: Medications Reviewed Today     Reviewed by Wyline Mood, RN (Case Manager) on 11/14/23 at 1229  Med List Status: <None>   Medication Order Taking? Sig Documenting Provider Last Dose Status Informant  acetaminophen (TYLENOL) 500 MG tablet [id number] Yes Take 1,000 mg by mouth every 6 (six) hours as needed (pain). [provider] Taking Active Self, Pharmacy Records  allopurinol (ZYLOPRIM) 100 MG tablet [id number] Yes Take 1 tablet (100 mg total) by mouth daily.  Patient taking differently: Take 100 mg by mouth at bedtime.   Wanda Plump, MD Taking Active Self, Pharmacy Records  APIXABAN Everlene Balls) VTE STARTER PACK (10MG  AND 5MG ) [id number] Yes Take as directed on package: start with two-5mg  tablets twice daily for 7 days. On day 8, switch to one-5mg  tablet twice daily. Lorin Glass, MD Taking Active   aspirin EC 81 MG tablet [id number] Yes Take 1 tablet (81 mg total) by mouth daily. Laurann Montana, PA-C Taking Active Self, Pharmacy Records            Med Note Laray Anger Nov 10, 2023  8:51 AM) Gustavus Bryant taking daily up until recent.   atorvastatin (LIPITOR) 40 MG tablet [id number] Yes Take 1 tablet (40 mg total) by mouth daily.  Patient taking differently: Take 40 mg by mouth at bedtime.   Wanda Plump, MD Taking Active Self, Pharmacy Records  carvedilol (COREG) 12.5 MG tablet [id number] Yes Take 1 tablet (12.5 mg total) by mouth daily. Lorin Glass, MD Taking Active   furosemide (LASIX) 20 MG tablet [id number] Yes Take 1 tablet (20 mg total) by mouth daily. Wanda Plump, MD Taking Active Self, Pharmacy Records  levothyroxine (SYNTHROID) 175 MCG tablet [id number] Yes Take 175 mcg by mouth daily before breakfast. [provider] Taking Active Self, Pharmacy Records  losartan (COZAAR) 50 MG tablet [id number] Yes Take 1 tablet (50 mg total) by mouth daily.  Patient taking differently: Take 50 mg by mouth at bedtime.   Wanda Plump, MD Taking Active Self, Pharmacy Records  metFORMIN (GLUCOPHAGE) 1000 MG tablet [id number] Yes Take 1 tablet (1,000 mg total) by mouth 2 (two) times daily with a meal. Wanda Plump, MD Taking Active   Multiple Vitamin (MULTIVITAMIN WITH MINERALS) TABS tablet [id number] Yes Take 1 tablet by mouth daily. [provider] Taking Active Self, Pharmacy Records  OVER THE COUNTER MEDICATION [id number]  Apply 1 application  topically at bedtime as needed (CBD Cream for neuropathy in feet). [provider]  Active Self, Pharmacy Records           Med Note [place], [place] H   [place]  Nov 10, 2023  8:55 AM) >30 days since last administration.   oxyCODONE (OXY IR/ROXICODONE) 5 MG immediate release tablet [id number] No Take 1 tablet (5 mg total) by mouth every 6 (six) hours as needed for up to 5 days for moderate pain (pain score 4-6) or breakthrough pain.  Patient not taking: Reported on 11/14/2023   Lorin Glass, MD Not Taking Active   potassium chloride (KLOR-CON M10) 10 MEQ tablet [id number] Yes  Take 1 tablet (10 mEq total) by mouth daily.  Patient taking differently: Take 10 mEq by mouth at bedtime.   Wanda Plump, MD Taking Active Self, Pharmacy Records  tadalafil (CIALIS) 20 MG tablet [id number] Yes Take 1 tablet (20 mg total) by mouth daily as needed for erectile dysfunction. Wanda Plump, MD Taking Active Self, Pharmacy Records           Med Note Laray Anger Nov 10, 2023  8:55 AM) >30 days since last dose.   vitamin E 400 UNIT capsule [id number] Yes Take 400 Units by mouth daily. [provider] Taking Active Self, Pharmacy Records            Home Care and Equipment/Supplies: Were Home Health Services Ordered?: NA Any new equipment or medical supplies ordered?: Yes Name of Medical supply agency?: [employer] - provided at the hospital Were you able to get the equipment/medical supplies?: Yes Do you have any questions related to the use of the equipment/supplies?: No  Functional Questionnaire: Do you need assistance with bathing/showering or dressing?: No Do you need assistance with meal preparation?: No Do you need assistance with eating?: No Do you have difficulty maintaining continence: No Do you need assistance with getting out of bed/getting out of a chair/moving?: No Do you have difficulty managing or taking your medications?: No  Follow up appointments reviewed: PCP Follow-up appointment confirmed?: Yes Date of PCP follow-up appointment?: 11/16/23 Follow-up Provider: Dr. Drue Novel [facility] Follow-up appointment confirmed?: Yes Date of Specialist follow-up appointment?: 11/21/23 Follow-Up Specialty Provider:: Cardiology - Device Check, 12/06/23 Dr. Lenell Antu Do you need transportation to your follow-up appointment?: No Do you understand care options if your condition(s) worsen?: Yes-patient verbalized understanding  SDOH Interventions Today    Flowsheet Row Most Recent Value  SDOH Interventions   Food Insecurity Interventions  Intervention Not Indicated  Housing Interventions Intervention Not Indicated  Transportation Interventions Intervention Not Indicated  Utilities Interventions Intervention Not Indicated   TOC interventions discussed/reviewed:  Reviewed PCP and any Specialist upcoming appointments and importance of keeping scheduled appointments.  Patient verbal education was provided and verbalized understanding of  30 Day Program, Hospital discharge summary instructions including post-discharge diet and activity orders:  Activity: As tolerated with full fall precautions use scuffed bottom socks/slippers when ambulating with compression hose on;  home medications, their functions and potential side-effects, condition-specific self-management and  when to notify provider for questions, concerns, or changes in condition.  Patient is recovering well at home.  Denies any pain, shortness of breath or chest pain.  No acute needs identified at this time.  Name and phone number provided should he think of any questions later.  Aritza Brunet Daphine Deutscher BSN, RN RN Care Manager   Transitions of Care [facility] Direct Dial Number:  [phone]

## 2023-11-16 ENCOUNTER — Ambulatory Visit: Payer: PPO | Admitting: Internal Medicine

## 2023-11-16 ENCOUNTER — Encounter: Payer: Self-pay | Admitting: Internal Medicine

## 2023-11-16 VITALS — BP 136/68 | HR 64 | Temp 97.7°F | Resp 18 | Ht 74.0 in | Wt 242.0 lb

## 2023-11-16 DIAGNOSIS — I824Y2 Acute embolism and thrombosis of unspecified deep veins of left proximal lower extremity: Secondary | ICD-10-CM | POA: Diagnosis not present

## 2023-11-16 DIAGNOSIS — E118 Type 2 diabetes mellitus with unspecified complications: Secondary | ICD-10-CM | POA: Diagnosis not present

## 2023-11-16 LAB — CBC WITH DIFFERENTIAL/PLATELET
Basophils Absolute: 0 10*3/uL (ref 0.0–0.1)
Basophils Relative: 1 % (ref 0.0–3.0)
Eosinophils Absolute: 0.2 10*3/uL (ref 0.0–0.7)
Eosinophils Relative: 5.1 % — ABNORMAL HIGH (ref 0.0–5.0)
HCT: 30.9 % — ABNORMAL LOW (ref 39.0–52.0)
Hemoglobin: 10.4 g/dL — ABNORMAL LOW (ref 13.0–17.0)
Lymphocytes Relative: 19.1 % (ref 12.0–46.0)
Lymphs Abs: 0.9 10*3/uL (ref 0.7–4.0)
MCHC: 33.6 g/dL (ref 30.0–36.0)
MCV: 91.7 fL (ref 78.0–100.0)
Monocytes Absolute: 0.5 10*3/uL (ref 0.1–1.0)
Monocytes Relative: 10.4 % (ref 3.0–12.0)
Neutro Abs: 3 10*3/uL (ref 1.4–7.7)
Neutrophils Relative %: 64.4 % (ref 43.0–77.0)
Platelets: 293 10*3/uL (ref 150.0–400.0)
RBC: 3.37 Mil/uL — ABNORMAL LOW (ref 4.22–5.81)
RDW: 13.9 % (ref 11.5–15.5)
WBC: 4.6 10*3/uL (ref 4.0–10.5)

## 2023-11-16 LAB — BASIC METABOLIC PANEL
BUN: 12 mg/dL (ref 6–23)
CO2: 29 meq/L (ref 19–32)
Calcium: 8.2 mg/dL — ABNORMAL LOW (ref 8.4–10.5)
Chloride: 102 meq/L (ref 96–112)
Creatinine, Ser: 1 mg/dL (ref 0.40–1.50)
GFR: 75.57 mL/min (ref >60.00)
Glucose, Bld: 227 mg/dL — ABNORMAL HIGH (ref 70–99)
Potassium: 4.4 meq/L (ref 3.5–5.1)
Sodium: 139 meq/L (ref 135–145)

## 2023-11-16 LAB — MICROALBUMIN / CREATININE URINE RATIO
Creatinine,U: 68 mg/dL
Microalb Creat Ratio: 67.1 mg/g — ABNORMAL HIGH (ref 0.0–30.0)
Microalb, Ur: 45.7 mg/dL — ABNORMAL HIGH (ref 0.0–1.9)

## 2023-11-16 MED ORDER — APIXABAN 5 MG PO TABS: 5.0000 mg | ORAL_TABLET | Freq: Two times a day (BID) | ORAL | 1 refills | Status: DC

## 2023-11-16 NOTE — Progress Notes (Unsigned)
Subjective:    Patient ID: Mike Jimenez, male    DOB: January 06, 1951, 72 y.o.   MRN: [id number]  DOS:  11/16/2023 Type of visit - description: Hospital follow-up  See LOV, the patient presented with left leg swelling, eventually diagnosed with large DVT. Was admitted to the hospital, CTA negative for PE, was placed on IV heparin drip and subsequently had a mechanical thrombectomy December 13.  Since he left the hospital he is doing well. Denies nausea vomiting.  No abdominal pain no change in the color of the stools No chest pain no difficulty breathing  Review of Systems See above   Past Medical History:  Diagnosis Date   Anemia    Arthritis    "knees" (09/05/2014)   Automatic implantable cardioverter-defibrillator in situ    CAD (coronary artery disease)    a. Mild nonobstructive CAD by cath 08/2014.   CHF (congestive heart failure) (HCC)    Childhood asthma    Gastric ulcer 2011   GERD (gastroesophageal reflux disease)    History of gout    Hyperlipidemia    Hypertension    LBBB (left bundle branch block)    NICM (nonischemic cardiomyopathy) (HCC)    a. Dx 2011 - presented with sustained VT. Normal coronaries at that time, EF 10-15% by cath and 20-25% by echo. b. s/p biventricular ICD 2011. c. CP 08/2014: mild nonobstructive CAD.   Obesity    Postsurgical hypothyroidism    Thyroid cancer (HCC)    a. 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 Dr Gerrit Friends. s/p thyroidectomy 2013.   Torn meniscus    Type II diabetes mellitus (HCC)    Ventricular tachycardia (HCC)    a. Sustained VT 2011 s/p MDT D274TRK Concert BiV ICD, ser# [id number].    Past Surgical History:  Procedure Laterality Date   BI-VENTRICULAR IMPLANTABLE CARDIOVERTER DEFIBRILLATOR  (CRT-D)  03/2010   BIOPSY  08/06/2021   Procedure: BIOPSY;  Surgeon: Hilarie Fredrickson, MD;  Location: [facility];  Service: Endoscopy;;   CARDIAC CATHETERIZATION  03/2010   COLONOSCOPY WITH PROPOFOL N/A 08/06/2021   Procedure:  COLONOSCOPY WITH PROPOFOL;  Surgeon: Hilarie Fredrickson, MD;  Location: [facility];  Service: Endoscopy;  Laterality: N/A;   EP IMPLANTABLE DEVICE N/A 05/21/2016   Procedure:  ICD Generator Changeout;  Surgeon: Marinus Maw, MD;  Location: [facility] INVASIVE CV LAB;  Service: Cardiovascular;  Laterality: N/A;   INSERTION OF ILIAC STENT Left 11/12/2023   Procedure: INSERTION OF LEFT ILIAC VEIN STENT;  Surgeon: [name], MD;  Location: [facility] OR;  Service: Vascular;  Laterality: Left;   LEFT HEART CATHETERIZATION WITH CORONARY ANGIOGRAM N/A 09/06/2014   Procedure: LEFT HEART CATHETERIZATION WITH CORONARY ANGIOGRAM;  Surgeon: Kathleene Hazel, MD;  Location: [facility] CATH LAB;  Service: Cardiovascular;  Laterality: N/A;   LOWER EXTREMITY VENOGRAPHY Left 11/11/2023   Procedure: LOWER EXTREMITY VENOGRAPHY;  Surgeon: [name], MD;  Location: [facility] INVASIVE CV LAB;  Service: Cardiovascular;  Laterality: Left;   PERIPHERAL VASCULAR THROMBECTOMY Left 11/11/2023   Procedure: PERIPHERAL VASCULAR THROMBECTOMY;  Surgeon: [name], MD;  Location: [facility] INVASIVE CV LAB;  Service: Cardiovascular;  Laterality: Left;   PERIPHERAL VASCULAR ULTRASOUND/IVUS Left 11/11/2023   Procedure: Peripheral Vascular Ultrasound/IVUS;  Surgeon: [name], MD;  Location: [facility] INVASIVE CV LAB;  Service: Cardiovascular;  Laterality: Left;   POLYPECTOMY  08/06/2021   Procedure: POLYPECTOMY;  Surgeon: Hilarie Fredrickson, MD;  [facility]: [facility];  Service: Endoscopy;;   PTX  traumatic, age 55 months   THYROIDECTOMY  12/10/2011   Procedure: TOTAL THYROIDECTOMY WITH CENTRAL COMPARTMENT DISSECTION;  Surgeon: Velora Heckler, MD;  Location: [facility] OR;  Service: General;  Laterality: N/A;  Total thyroidectomy with limited central compartment lymph node dissection.   VASECTOMY      Current Outpatient Medications  Medication Instructions   acetaminophen (TYLENOL) 1,000 mg, Every 6 hours PRN   allopurinol (ZYLOPRIM) 100 mg, Oral, Daily    APIXABAN (ELIQUIS) VTE STARTER PACK (10MG  AND 5MG ) Take as directed on package: start with two-5mg  tablets twice daily for 7 days. On day 8, switch to one-5mg  tablet twice daily.   aspirin EC 81 mg, Oral, Daily   atorvastatin (LIPITOR) 40 mg, Oral, Daily   carvedilol (COREG) 12.5 mg, Oral, Daily   furosemide (LASIX) 20 mg, Oral, Daily   levothyroxine (SYNTHROID) 175 mcg, Daily before breakfast   losartan (COZAAR) 50 mg, Oral, Daily   metFORMIN (GLUCOPHAGE) 1,000 mg, Oral, 2 times daily with meals   Multiple Vitamin (MULTIVITAMIN WITH MINERALS) TABS tablet 1 tablet, Daily   OVER THE COUNTER MEDICATION 1 application , At bedtime PRN   oxyCODONE (OXY IR/ROXICODONE) 5 mg, Oral, Every 6 hours PRN   potassium chloride (KLOR-CON M10) 10 MEQ tablet 10 mEq, Oral, Daily   tadalafil (CIALIS) 20 mg, Oral, Daily PRN   vitamin E 400 Units, Daily       Objective:   Physical Exam BP 136/68   Pulse 64   Temp 97.7 F (36.5 C) (Oral)   Resp 18   Ht 6\' 2"  (1.88 m)   Wt 242 lb (109.8 kg)   SpO2 97%   BMI 31.07 kg/m  General:   Well developed, NAD, BMI noted. HEENT:  Normocephalic . Face symmetric, atraumatic Lungs:  CTA B Normal respiratory effort, no intercostal retractions, no accessory muscle use. Heart: RRR,  no murmur.  Lower extremities: R leg normal, L leg: Has a compression stocking, much less swelling compared to previous exam, has a left popliteal to a 3 cm lump.  Not tender  Skin: Not pale. Not jaundice Neurologic:  alert & oriented X3.  Speech normal, gait appropriate for age and unassisted Psych--  Cognition and judgment appear intact.  Cooperative with normal attention span and concentration.  Behavior appropriate. No anxious or depressed appearing.      Assessment    Assessment DM,  actos intolerant 01-2018, sees Dr Sharl Ma + Mild neuropathy on exam 03/2017 (used to be on gaba for muscle cramps) HTN Hyperlipidemia Hypothyroidism h/oThyroid cancer, nodularity, no  mets, thyroidectomy 2013- sees Dr Sharl Ma yearly as off 05/2020 CV: --CHF/nonischemic cardiomyopathy dx 2011 --Ventricular tachycardia, 2011,  EF  ~15 -20 % -- s/p  biventricular ICD 2011--- replaced 04-2016 --CAD -Nonobstructive   by cath 08-2014 --LBBB  GI: --GERD --H/o anemia:  --Gastric ulcer per EGD 07-2010 DJD (hip pain, XRs dosne 2017) Gout H/o asthma as a child  Large L DVT: 11/11/2023 Mechanical thrombectomy f/u by Angioplasty stenting L common and external iliac veins  PLAN L DVT: Since the last visit diagnosed with a large DVT, had a mechanical thrombectomy and angioplasty, was amended to take aspirin Eliquis.  He is using a compression stocking consistently.  He started to do some walking. We talk about the need of good hydration, increase future risk for another DVT, risk of anticoagulation, red flags discussed including change in the color of the stools, abdominal pain or headache. Will check a BMP and CBC. Refill Eliquis sent. At  some point will consider consult hematology. DM: Per endocrinology, CBGs slightly higher since he left the hospital, around 180, recommend to contact endocrinology. RTC scheduled for 12/2023   Labs reviewed. Will get a BMP, CBC  Left leg swelling: 1 week history of left leg swelling as described above, strongly suspect DVT on clinical grounds. Given extent of the swelling I am recommending him to go to the ER for immediate attention.  If DVT is confirmed, a admission to hospital for observation could be entertained. Pt initially though swelling was gout, no evidence of clinical gout at this point.

## 2023-11-16 NOTE — Patient Instructions (Addendum)
   GO TO THE LAB : Get the blood work    I will see you in February as scheduled  Once you finish your Eliquis starting package, take Eliquis 5 mg twice daily.  Were sending a prescription.

## 2023-11-17 NOTE — Assessment & Plan Note (Signed)
L DVT: Since the last visit diagnosed with a large DVT, had a mechanical thrombectomy and angioplasty, was rx to take aspirin Eliquis.  He is using a compression stocking consistently.  He started to do some walking. We talk about the need of good hydration, increase future risk for another DVT, risk of anticoagulation, watch for change in the color of the stools, abdominal pain or headaches. Will check a BMP and CBC. Refill Eliquis sent. At some point will consider consult hematology. DM: Per endocrinology, CBGs slightly higher since he left the hospital, around 180, recommend to contact endocrinology. RTC scheduled for 12/2023

## 2023-11-21 ENCOUNTER — Ambulatory Visit: Payer: PPO | Attending: Internal Medicine

## 2023-11-21 DIAGNOSIS — Z95810 Presence of automatic (implantable) cardiac defibrillator: Secondary | ICD-10-CM

## 2023-11-21 DIAGNOSIS — I5022 Chronic systolic (congestive) heart failure: Secondary | ICD-10-CM

## 2023-11-24 ENCOUNTER — Telehealth: Payer: Self-pay

## 2023-11-24 NOTE — Progress Notes (Signed)
EPIC Encounter for ICM Monitoring  Patient Name: Mike Jimenez is a 72 y.o. male Date: 11/24/2023 Primary Care Physican: Wanda Plump, MD Primary Cardiologist: Ladona Ridgel Electrophysiologist: Rae Roam Pacing:  98.5% 01/26/2023 Weight: 239 lbs 05/20/2023 Weight: 232 lbs 06/21/2023 Weight: 225-232 lbs 09/13/2023 Weight: 228 lbs   Clinical Status Since 25-Oct-2023 Time in AT/AF 0.0 hr/day (0.0%)   Battery ERI:  3 months                                            Attempted call to patient and unable to reach.    Transmission results reviewed.     OptiVol Thoracic impedance suggesting possible fluid accumulation starting 12/12 and returned to baseline 12/23.   Prescribed:  Furosemide 20 mg 1 tablet (20 mg total) daily.   Potassium 10 mEq 1 tablet (10 mEq total) daily   Labs: 11/16/2023 Creatinine 1.00, BUN 12, Potassium 4.4, Sodium 139  11/13/2023 Creatinine 1.28, BUN 19, Potassium 4.2, Sodium 137, GFR 60 11/12/2023 Creatinine 1.13, BUN 18, Potassium 3.8, Sodium 134, GFR >60  11/11/2023 Creatinine 1.27, BUN 21, Potassium 4.0, Sodium 139, GFR >60  11/10/2023 Creatinine 1.15, BUN 22, Potassium 3.8, Sodium 138, GFR >60  11/09/2023 Creatinine 1.09, BUN 24, Potassium 4.1, Sodium 136, GFR 11  09/08/2023 Creatinine 1.00, BUN 18, Potassium 4.2, Sodium 143, GFR >60 01/14/2023 Creatinine 1.06, BUN 16, Potassium 4.1, Sodium 141  01/03/2023 Creatinine 1.01, BUN 16, Potassium 4.1, Sodium 142  A complete set of results can be found in Results Review.   Recommendations:  Unable to reach.     Follow-up plan: ICM clinic phone appointment on 12/26/2023.   91 day device clinic remote transmission 12/21/2023.     EP/Cardiology Office Visits:  12/14/2023 with Dr Ladona Ridgel.     Copy of ICM check sent to Dr. Ladona Ridgel.  3 month ICM trend: 11/21/2023.    12-14 Month ICM trend:     Karie Soda, RN 11/24/2023 9:07 AM

## 2023-11-24 NOTE — Telephone Encounter (Signed)
Remote ICM transmission received.  Attempted call to patient regarding ICM remote transmission and left detailed message per DPR.  Left ICM phone number and advised to return call for any fluid symptoms or questions. Next ICM remote transmission scheduled 12/26/2023.

## 2023-12-05 NOTE — Progress Notes (Signed)
 [facility] [place]  ASSESSMENT / PLAN: 73 y.o. male status post left lower extremity mechanical thrombectomy, thrombolysis, and left common iliac vein stenting 12/13 to a 11/12/2023.  He is doing much better.  He still has some persistent swelling in his leg.  He has been compliant with compression and elevation.  He needs to continue Eliquis  for 6 months at a minimum.  He should continue aspirin  indefinitely.  I will see him back in 6 months to perform a duplex of the stenting.  I counseled him to return to care sooner should any new symptoms of DVT develop.  CHIEF COMPLAINT: Follow-up from thrombectomy  HISTORY OF PRESENT ILLNESS: 11/09/23: Mike Jimenez presented to med [facility] today with a 1 week history of left leg swelling beginning at the hip going all the way down his leg.  He feels pressure when he walks.  He denies any chest pain.  He denies any prolonged inactivity.  He did have a left knee injection on November 19 for knee pain.  He denies any prior history of DVT.  He was admitted last month for dehydartion    The patient has a history of diabetes.  He also suffers from coronary artery disease and congestive heart failure.  He is medically managed for hypertension with an ARB.  He takes a statin for hypercholesterolemia.  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  He is doing very well overall.  He still has some residual swelling in the left leg, which is not very bothersome to him.  He has been compliant with compression and elevation therapies.  He has been compliant with aspirin  and DOAC therapies.  Past Medical History:  Diagnosis Date   Anemia    Arthritis    knees (09/05/2014)   Automatic implantable cardioverter-defibrillator in situ    CAD (coronary artery disease)    a. Mild nonobstructive CAD by cath 08/2014.   CHF  (congestive heart failure) (HCC)    Childhood asthma    Gastric ulcer 2011   GERD (gastroesophageal reflux disease)    History of gout    Hyperlipidemia    Hypertension    LBBB (left bundle branch block)    NICM (nonischemic cardiomyopathy) (HCC)    a. Dx 2011 - presented with sustained VT. Normal coronaries at that time, EF 10-15% by cath and 20-25% by echo. b. s/p biventricular ICD 2011. c. CP 08/2014: mild nonobstructive CAD.   Obesity    Postsurgical hypothyroidism    Thyroid  cancer (HCC)    a. 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 Dr Eletha. s/p thyroidectomy 2013.   Torn meniscus    Type II diabetes mellitus (HCC)    Ventricular tachycardia (HCC)    a. Sustained VT 2011 s/p MDT D274TRK Concert BiV ICD, ser# [id number].    Past Surgical History:  Procedure Laterality Date   BI-VENTRICULAR IMPLANTABLE CARDIOVERTER DEFIBRILLATOR  (CRT-D)  03/2010   BIOPSY  08/06/2021   Procedure: BIOPSY;  Surgeon: Abran Mike SAILOR, MD;  [facility]: [facility];  Service: Endoscopy;;   CARDIAC CATHETERIZATION  03/2010   COLONOSCOPY WITH PROPOFOL  N/A 08/06/2021   Procedure: COLONOSCOPY WITH PROPOFOL ;  Surgeon: Abran Mike SAILOR, MD;  Location: [facility];  Service: Endoscopy;  Laterality: N/A;   EP IMPLANTABLE DEVICE N/A 05/21/2016   Procedure:  ICD Generator Changeout;  Surgeon: Danelle LELON Birmingham, MD;  Location: [facility] INVASIVE  CV LAB;  Service: Cardiovascular;  Laterality: N/A;   INSERTION OF ILIAC STENT Left 11/12/2023   Procedure: INSERTION OF LEFT ILIAC VEIN STENT;  Surgeon: Magda Debby SAILOR, MD;  Location: [facility] OR;  Service: Vascular;  Laterality: Left;   LEFT HEART CATHETERIZATION WITH CORONARY ANGIOGRAM N/A 09/06/2014   Procedure: LEFT HEART CATHETERIZATION WITH CORONARY ANGIOGRAM;  Surgeon: Lonni JONETTA Cash, MD;  Location: [facility] CATH LAB;  Service: Cardiovascular;  Laterality: N/A;   LOWER EXTREMITY VENOGRAPHY Left 11/11/2023   Procedure: LOWER EXTREMITY VENOGRAPHY;  Surgeon: Magda Debby SAILOR,  MD;  Location: [facility] INVASIVE CV LAB;  Service: Cardiovascular;  Laterality: Left;   PERIPHERAL VASCULAR THROMBECTOMY Left 11/11/2023   Procedure: PERIPHERAL VASCULAR THROMBECTOMY;  Surgeon: Magda Debby SAILOR, MD;  Location: [facility] INVASIVE CV LAB;  Service: Cardiovascular;  Laterality: Left;   PERIPHERAL VASCULAR ULTRASOUND/IVUS Left 11/11/2023   Procedure: Peripheral Vascular Ultrasound/IVUS;  Surgeon: Magda Debby SAILOR, MD;  Location: [facility] INVASIVE CV LAB;  Service: Cardiovascular;  Laterality: Left;   POLYPECTOMY  08/06/2021   Procedure: POLYPECTOMY;  Surgeon: Abran Mike SAILOR, MD;  Location: [facility];  Service: Endoscopy;;   PTX     traumatic, age 64 months   THYROIDECTOMY  12/10/2011   Procedure: TOTAL THYROIDECTOMY WITH CENTRAL COMPARTMENT DISSECTION;  Surgeon: Krystal CHRISTELLA Spinner, MD;  Location: [facility] OR;  Service: General;  Laterality: N/A;  Total thyroidectomy with limited central compartment lymph node dissection.   VASECTOMY      Family History  Problem Relation Age of Onset   Atrial fibrillation Father        diseased at ag 60   Hypertension Father        alive @ 8.   Hypothyroidism Mother    Hypertension Mother        alive @ 2.   Diabetes Maternal Grandfather    Diabetes Maternal Aunt    Colon cancer Maternal Aunt    Colon cancer Maternal Aunt    Lung cancer Paternal Uncle    Lung cancer Maternal Uncle    Asthma Sister    Hypothyroidism Daughter    Prostate cancer Neg Hx    CAD Neg Hx     Social History   Socioeconomic History   Marital status: Married    Spouse name: Not on file   Number of children: 2   Years of education: Not on file   Highest education level: Bachelor's degree (e.g., BA, AB, BS)  Occupational History   Occupation: [employer]   Occupation: to retire 2023  Tobacco Use   Smoking status: Former    Current packs/day: 0.00    Average packs/day: 2.5 packs/day for 2.0 years (5.0 ttl pk-yrs)    Types: Cigarettes    Start date: 11/29/1968    Quit date: 11/29/1970     Years since quitting: 53.0   Smokeless tobacco: Former    Types: Associate Professor status: Never Used  Substance and Sexual Activity   Alcohol use: Yes    Comment: drinks rarely   Drug use: No   Sexual activity: Yes  Other Topics Concern   Not on file  Social History Narrative   Live w/ wife , daughter [name] lives in [place]   9 dogs, fish pond.   Job is  physical.   Does not routinely exercise.   Social Drivers of Corporate Investment Banker Strain: Low Risk  (11/13/2023)   Overall Financial Resource Strain (CARDIA)    Difficulty of Paying Living  Expenses: Not hard at all  Food Insecurity: No Food Insecurity (11/14/2023)   Hunger Vital Sign    Worried About Running Out of Food in the Last Year: Never true    Ran Out of Food in the Last Year: Never true  Transportation Needs: No Transportation Needs (11/14/2023)   PRAPARE - Administrator, Civil Service (Medical): No    Lack of Transportation (Non-Medical): No  Physical Activity: Insufficiently Active (11/13/2023)   Exercise Vital Sign    Days of Exercise per Week: 3 days    Minutes of Exercise per Session: 40 min  Stress: No Stress Concern Present (01/28/2022)   [employer] of Occupational Health - Occupational Stress Questionnaire    Feeling of Stress : Not at all  Social Connections: Moderately Integrated (11/13/2023)   Social Connection and Isolation Panel [NHANES]    Frequency of Communication with Friends and Family: More than three times a week    Frequency of Social Gatherings with Friends and Family: Once a week    Attends Religious Services: 1 to 4 times per year    Active Member of [employer] or Organizations: No    Attends Banker Meetings: Not on file    Marital Status: Married  Catering Manager Violence: Not At Risk (11/14/2023)   Humiliation, Afraid, Rape, and Kick questionnaire    Fear of Current or Ex-Partner: No    Emotionally Abused: No    Physically Abused: No     Sexually Abused: No    Allergies  Allergen Reactions   Bee Venom Swelling   Amiodarone  Other (See Comments)   Ramipril Cough    Current Outpatient Medications  Medication Sig Dispense Refill   acetaminophen  (TYLENOL ) 500 MG tablet Take 1,000 mg by mouth every 6 (six) hours as needed (pain).     allopurinol  (ZYLOPRIM ) 100 MG tablet Take 1 tablet (100 mg total) by mouth daily. (Patient taking differently: Take 100 mg by mouth at bedtime.) 90 tablet 1   apixaban  (ELIQUIS ) 5 MG TABS tablet Take 1 tablet (5 mg total) by mouth 2 (two) times daily. 180 tablet 1   APIXABAN  (ELIQUIS ) VTE STARTER PACK (10MG  AND 5MG ) Take as directed on package: start with two-5mg  tablets twice daily for 7 days. On day 8, switch to one-5mg  tablet twice daily. 74 each 0   aspirin  EC 81 MG tablet Take 1 tablet (81 mg total) by mouth daily.     atorvastatin  (LIPITOR) 40 MG tablet Take 1 tablet (40 mg total) by mouth daily. (Patient taking differently: Take 40 mg by mouth at bedtime.) 90 tablet 2   carvedilol  (COREG ) 12.5 MG tablet Take 1 tablet (12.5 mg total) by mouth daily.     furosemide  (LASIX ) 20 MG tablet Take 1 tablet (20 mg total) by mouth daily. 90 tablet 1   levothyroxine  (SYNTHROID ) 175 MCG tablet Take 175 mcg by mouth daily before breakfast.     losartan  (COZAAR ) 50 MG tablet Take 1 tablet (50 mg total) by mouth daily. (Patient taking differently: Take 50 mg by mouth at bedtime.) 90 tablet 1   metFORMIN  (GLUCOPHAGE ) 1000 MG tablet Take 1 tablet (1,000 mg total) by mouth 2 (two) times daily with a meal. 180 tablet 1   Multiple Vitamin (MULTIVITAMIN WITH MINERALS) TABS tablet Take 1 tablet by mouth daily.     OVER THE COUNTER MEDICATION Apply 1 application  topically at bedtime as needed (CBD Cream for neuropathy in feet).     potassium chloride  (  KLOR-CON  M10) 10 MEQ tablet Take 1 tablet (10 mEq total) by mouth daily. (Patient taking differently: Take 10 mEq by mouth at bedtime.) 90 tablet 2   tadalafil   (CIALIS ) 20 MG tablet Take 1 tablet (20 mg total) by mouth daily as needed for erectile dysfunction. 10 tablet 5   vitamin E 400 UNIT capsule Take 400 Units by mouth daily.     No current facility-administered medications for this visit.    PHYSICAL EXAM There were no vitals filed for this visit.  Well-appearing elderly man in no distress Regular rate and rhythm Unlabored breathing Left leg 2+ dorsalis pedis pulse Trace edema about the ankle   PERTINENT LABORATORY AND RADIOLOGIC DATA  Most recent CBC    Latest Ref Rng & Units 11/16/2023   12:11 PM 11/13/2023    3:39 AM 11/12/2023   10:01 AM  CBC  WBC 4.0 - 10.5 K/uL 4.6  5.7  10.2   Hemoglobin 13.0 - 17.0 g/dL 89.5  89.1  87.3   Hematocrit 39.0 - 52.0 % 30.9  32.3  37.2   Platelets 150.0 - 400.0 K/uL 293.0  173  173      Most recent CMP    Latest Ref Rng & Units 11/16/2023   12:11 PM 11/13/2023    3:39 AM 11/12/2023    4:02 AM  CMP  Glucose 70 - 99 mg/dL 772  817  817   BUN 6 - 23 mg/dL 12  19  18    Creatinine 0.40 - 1.50 mg/dL 8.99  8.71  8.86   Sodium 135 - 145 mEq/L 139  137  134   Potassium 3.5 - 5.1 mEq/L 4.4  4.2  3.8   Chloride 96 - 112 mEq/L 102  106  107   CO2 19 - 32 mEq/L 29  23  23    Calcium  8.4 - 10.5 mg/dL 8.2  8.5  8.5     Renal function CrCl cannot be calculated (Unknown ideal weight.).  Hemoglobin A1C (no units)  Date Value  08/11/2023 6.2    LDL Cholesterol  Date Value Ref Range Status  01/03/2023 64 0 - 99 mg/dL Final   Direct LDL  Date Value Ref Range Status  12/31/2021 79.0 mg/dL Final    Comment:    Optimal:  <100 mg/dLNear or Above Optimal:  100-129 mg/dLBorderline High:  130-159 mg/dLHigh:  160-189 mg/dLVery High:  >190 mg/dL     Debby Jimenez. Magda, MD [facility] Phone Number: [phone] 12/05/2023 8:36 AM   Total time spent on preparing this encounter including chart review, data review, collecting history, examining the  patient, coordinating care for this established patient, 30 minutes.  Portions of this report may have been transcribed using voice recognition software.  Every effort has been made to ensure accuracy; however, inadvertent computerized transcription errors may still be present.

## 2023-12-06 ENCOUNTER — Encounter: Payer: Self-pay | Admitting: Internal Medicine

## 2023-12-06 ENCOUNTER — Ambulatory Visit (INDEPENDENT_AMBULATORY_CARE_PROVIDER_SITE_OTHER): Payer: PPO | Admitting: Vascular Surgery

## 2023-12-06 ENCOUNTER — Encounter: Payer: Self-pay | Admitting: Vascular Surgery

## 2023-12-06 VITALS — BP 127/72 | HR 82 | Temp 98.1°F | Resp 20 | Ht 74.0 in | Wt 241.8 lb

## 2023-12-06 DIAGNOSIS — I824Y2 Acute embolism and thrombosis of unspecified deep veins of left proximal lower extremity: Secondary | ICD-10-CM

## 2023-12-11 ENCOUNTER — Other Ambulatory Visit: Payer: Self-pay | Admitting: Internal Medicine

## 2023-12-12 ENCOUNTER — Other Ambulatory Visit: Payer: Self-pay | Admitting: Internal Medicine

## 2023-12-13 ENCOUNTER — Other Ambulatory Visit: Payer: Self-pay | Admitting: Internal Medicine

## 2023-12-14 ENCOUNTER — Ambulatory Visit: Payer: PPO | Admitting: Internal Medicine

## 2023-12-14 DIAGNOSIS — E119 Type 2 diabetes mellitus without complications: Secondary | ICD-10-CM | POA: Diagnosis not present

## 2023-12-14 DIAGNOSIS — E890 Postprocedural hypothyroidism: Secondary | ICD-10-CM | POA: Diagnosis not present

## 2023-12-14 DIAGNOSIS — Z85850 Personal history of malignant neoplasm of thyroid: Secondary | ICD-10-CM | POA: Diagnosis not present

## 2023-12-14 LAB — TSH: TSH: 1.41 (ref 0.41–5.90)

## 2023-12-14 LAB — LIPID PANEL
Cholesterol: 201 — AB (ref 0–200)
HDL: 43 (ref 35–70)
LDL Cholesterol: 116
Triglycerides: 240 — AB (ref 40–160)

## 2023-12-14 LAB — HEMOGLOBIN A1C: Hemoglobin A1C: 8.4

## 2023-12-16 ENCOUNTER — Other Ambulatory Visit: Payer: Self-pay

## 2023-12-16 DIAGNOSIS — I824Y2 Acute embolism and thrombosis of unspecified deep veins of left proximal lower extremity: Secondary | ICD-10-CM

## 2023-12-19 ENCOUNTER — Encounter: Payer: Self-pay | Admitting: Internal Medicine

## 2023-12-19 LAB — HM DIABETES EYE EXAM

## 2024-01-06 ENCOUNTER — Encounter: Payer: Self-pay | Admitting: Internal Medicine

## 2024-01-06 ENCOUNTER — Ambulatory Visit (INDEPENDENT_AMBULATORY_CARE_PROVIDER_SITE_OTHER): Payer: PPO | Admitting: Internal Medicine

## 2024-01-06 VITALS — BP 122/80 | HR 50 | Temp 98.0°F | Resp 18 | Ht 74.0 in | Wt 234.1 lb

## 2024-01-06 DIAGNOSIS — M1A9XX0 Chronic gout, unspecified, without tophus (tophi): Secondary | ICD-10-CM | POA: Diagnosis not present

## 2024-01-06 DIAGNOSIS — E785 Hyperlipidemia, unspecified: Secondary | ICD-10-CM | POA: Diagnosis not present

## 2024-01-06 DIAGNOSIS — D509 Iron deficiency anemia, unspecified: Secondary | ICD-10-CM | POA: Diagnosis not present

## 2024-01-06 DIAGNOSIS — Z86718 Personal history of other venous thrombosis and embolism: Secondary | ICD-10-CM

## 2024-01-06 DIAGNOSIS — Z125 Encounter for screening for malignant neoplasm of prostate: Secondary | ICD-10-CM

## 2024-01-06 DIAGNOSIS — E1121 Type 2 diabetes mellitus with diabetic nephropathy: Secondary | ICD-10-CM

## 2024-01-06 DIAGNOSIS — I10 Essential (primary) hypertension: Secondary | ICD-10-CM | POA: Diagnosis not present

## 2024-01-06 DIAGNOSIS — N289 Disorder of kidney and ureter, unspecified: Secondary | ICD-10-CM

## 2024-01-06 DIAGNOSIS — E118 Type 2 diabetes mellitus with unspecified complications: Secondary | ICD-10-CM

## 2024-01-06 DIAGNOSIS — Z0000 Encounter for general adult medical examination without abnormal findings: Secondary | ICD-10-CM

## 2024-01-06 DIAGNOSIS — Z0001 Encounter for general adult medical examination with abnormal findings: Secondary | ICD-10-CM

## 2024-01-06 DIAGNOSIS — R809 Proteinuria, unspecified: Secondary | ICD-10-CM

## 2024-01-06 LAB — BASIC METABOLIC PANEL
BUN: 15 mg/dL (ref 6–23)
CO2: 30 meq/L (ref 19–32)
Calcium: 9.3 mg/dL (ref 8.4–10.5)
Chloride: 99 meq/L (ref 96–112)
Creatinine, Ser: 1.06 mg/dL (ref 0.40–1.50)
GFR: 70.4 mL/min (ref >60.00)
Glucose, Bld: 339 mg/dL — ABNORMAL HIGH (ref 70–99)
Potassium: 4.4 meq/L (ref 3.5–5.1)
Sodium: 141 meq/L (ref 135–145)

## 2024-01-06 LAB — CBC WITH DIFFERENTIAL/PLATELET
Basophils Absolute: 0 10*3/uL (ref 0.0–0.1)
Basophils Relative: 0.6 % (ref 0.0–3.0)
Eosinophils Absolute: 0.2 10*3/uL (ref 0.0–0.7)
Eosinophils Relative: 2.9 % (ref 0.0–5.0)
HCT: 41.1 % (ref 39.0–52.0)
Hemoglobin: 14.2 g/dL (ref 13.0–17.0)
Lymphocytes Relative: 12.6 % (ref 12.0–46.0)
Lymphs Abs: 0.8 10*3/uL (ref 0.7–4.0)
MCHC: 34.4 g/dL (ref 30.0–36.0)
MCV: 89.7 fL (ref 78.0–100.0)
Monocytes Absolute: 0.5 10*3/uL (ref 0.1–1.0)
Monocytes Relative: 7 % (ref 3.0–12.0)
Neutro Abs: 5.1 10*3/uL (ref 1.4–7.7)
Neutrophils Relative %: 76.9 % (ref 43.0–77.0)
Platelets: 289 10*3/uL (ref 150.0–400.0)
RBC: 4.59 Mil/uL (ref 4.22–5.81)
RDW: 13.3 % (ref 11.5–15.5)
WBC: 6.7 10*3/uL (ref 4.0–10.5)

## 2024-01-06 LAB — ALT: ALT: 13 U/L (ref 0–53)

## 2024-01-06 LAB — PSA: PSA: 0.89 ng/mL (ref 0.10–4.00)

## 2024-01-06 LAB — AST: AST: 14 U/L (ref 0–37)

## 2024-01-06 LAB — URIC ACID: Uric Acid, Serum: 6.3 mg/dL (ref 4.0–7.8)

## 2024-01-06 NOTE — Progress Notes (Signed)
 Subjective:    Patient ID: Mike Jimenez, male    DOB: [birth year], 73 y.o.   MRN: [id number]  DOS:  01/06/2024 Type of visit - description: CPX  Here for CPX Chronic medical problems addressed. Had a recent DVT, no swelling, still has occasional pain at the area. Medications were recently adjusted by endocrinology. Denies chest pain or difficulty breathing. No blood in the stools or in the urine Neuropathy is still an issue but not severe.  Review of Systems  Other than above, a 14 point review of systems is negative     Past Medical History:  Diagnosis Date   Anemia    Arthritis    knees (09/05/2014)   Automatic implantable cardioverter-defibrillator in situ    CAD (coronary artery disease)    a. Mild nonobstructive CAD by cath 08/2014.   CHF (congestive heart failure) (HCC)    Childhood asthma    Gastric ulcer 2011   GERD (gastroesophageal reflux disease)    History of gout    Hyperlipidemia    Hypertension    LBBB (left bundle branch block)    NICM (nonischemic cardiomyopathy) (HCC)    a. Dx 2011 - presented with sustained VT. Normal coronaries at that time, EF 10-15% by cath and 20-25% by echo. b. s/p biventricular ICD 2011. c. CP 08/2014: mild nonobstructive CAD.   Obesity    Postsurgical hypothyroidism    Thyroid  cancer (HCC)    a. 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 Dr Eletha. s/p thyroidectomy 2013.   Torn meniscus    Type II diabetes mellitus (HCC)    Ventricular tachycardia (HCC)    a. Sustained VT 2011 s/p MDT [id number] Concert BiV ICD, ser# [id number].    Past Surgical History:  Procedure Laterality Date   BI-VENTRICULAR IMPLANTABLE CARDIOVERTER DEFIBRILLATOR  (CRT-D)  03/2010   BIOPSY  08/06/2021   Procedure: BIOPSY;  Surgeon: Abran Mike SAILOR, MD;  Location: [facility];  Service: Endoscopy;;   CARDIAC CATHETERIZATION  03/2010   COLONOSCOPY WITH PROPOFOL  N/A 08/06/2021   Procedure: COLONOSCOPY WITH PROPOFOL ;  Surgeon: Abran Mike SAILOR, MD;   Location: [facility];  Service: Endoscopy;  Laterality: N/A;   EP IMPLANTABLE DEVICE N/A 05/21/2016   Procedure:  ICD Generator Changeout;  Surgeon: Danelle LELON Birmingham, MD;  Location: [facility] INVASIVE CV LAB;  Service: Cardiovascular;  Laterality: N/A;   INSERTION OF ILIAC STENT Left 11/12/2023   Procedure: INSERTION OF LEFT ILIAC VEIN STENT;  Surgeon: Magda Debby SAILOR, MD;  Location: [facility] OR;  Service: Vascular;  Laterality: Left;   LEFT HEART CATHETERIZATION WITH CORONARY ANGIOGRAM N/A 09/06/2014   Procedure: LEFT HEART CATHETERIZATION WITH CORONARY ANGIOGRAM;  Surgeon: Lonni JONETTA Cash, MD;  Location: [facility] CATH LAB;  Service: Cardiovascular;  Laterality: N/A;   LOWER EXTREMITY VENOGRAPHY Left 11/11/2023   Procedure: LOWER EXTREMITY VENOGRAPHY;  Surgeon: Magda Debby SAILOR, MD;  Location: [facility] INVASIVE CV LAB;  Service: Cardiovascular;  Laterality: Left;   PERIPHERAL VASCULAR THROMBECTOMY Left 11/11/2023   Procedure: PERIPHERAL VASCULAR THROMBECTOMY;  Surgeon: Magda Debby SAILOR, MD;  Location: [facility] INVASIVE CV LAB;  Service: Cardiovascular;  Laterality: Left;   PERIPHERAL VASCULAR ULTRASOUND/IVUS Left 11/11/2023   Procedure: Peripheral Vascular Ultrasound/IVUS;  Surgeon: Magda Debby SAILOR, MD;  Location: [facility] INVASIVE CV LAB;  Service: Cardiovascular;  Laterality: Left;   POLYPECTOMY  08/06/2021   Procedure: POLYPECTOMY;  Surgeon: Abran Mike SAILOR, MD;  Location: [facility];  Service: Endoscopy;;   PTX     traumatic, age 47 months  THYROIDECTOMY  12/10/2011   Procedure: TOTAL THYROIDECTOMY WITH CENTRAL COMPARTMENT DISSECTION;  Surgeon: Krystal CHRISTELLA Spinner, MD;  Location: [facility] OR;  Service: General;  Laterality: N/A;  Total thyroidectomy with limited central compartment lymph node dissection.   VASECTOMY     Social History   Socioeconomic History   Marital status: Married    Spouse name: Not on file   Number of children: 2   Years of education: Not on file   Highest education level: Bachelor's degree (e.g., BA, AB,  BS)  Occupational History   Occupation: [employer]   Occupation: to retire 2023  Tobacco Use   Smoking status: Former    Current packs/day: 0.00    Average packs/day: 2.5 packs/day for 2.0 years (5.0 ttl pk-yrs)    Types: Cigarettes    Start date: 11/29/1968    Quit date: 11/29/1970    Years since quitting: 53.1   Smokeless tobacco: Former    Types: Associate Professor status: Never Used  Substance and Sexual Activity   Alcohol use: Yes    Comment: drinks rarely   Drug use: No   Sexual activity: Yes  Other Topics Concern   Not on file  Social History Narrative   Live w/ wife , daughter [name] lives in [place]   11 dogs, fish pond.       Social Drivers of Corporate Investment Banker Strain: Low Risk  (11/13/2023)   Overall Financial Resource Strain (CARDIA)    Difficulty of Paying Living Expenses: Not hard at all  Food Insecurity: No Food Insecurity (11/14/2023)   Hunger Vital Sign    Worried About Running Out of Food in the Last Year: Never true    Ran Out of Food in the Last Year: Never true  Transportation Needs: No Transportation Needs (11/14/2023)   PRAPARE - Administrator, Civil Service (Medical): No    Lack of Transportation (Non-Medical): No  Physical Activity: Insufficiently Active (11/13/2023)   Exercise Vital Sign    Days of Exercise per Week: 3 days    Minutes of Exercise per Session: 40 min  Stress: No Stress Concern Present (01/28/2022)   [employer] of Occupational Health - Occupational Stress Questionnaire    Feeling of Stress : Not at all  Social Connections: Moderately Integrated (11/13/2023)   Social Connection and Isolation Panel [NHANES]    Frequency of Communication with Friends and Family: More than three times a week    Frequency of Social Gatherings with Friends and Family: Once a week    Attends Religious Services: 1 to 4 times per year    Active Member of [employer] or Organizations: No    Attends Engineer, Structural: Not  on file    Marital Status: Married  Catering Manager Violence: Not At Risk (11/14/2023)   Humiliation, Afraid, Rape, and Kick questionnaire    Fear of Current or Ex-Partner: No    Emotionally Abused: No    Physically Abused: No    Sexually Abused: No    Current Outpatient Medications  Medication Instructions   acetaminophen  (TYLENOL ) 1,000 mg, Every 6 hours PRN   allopurinol  (ZYLOPRIM ) 100 mg, Oral, Daily   apixaban  (ELIQUIS ) 5 mg, Oral, 2 times daily   aspirin  EC 81 mg, Oral, Daily   atorvastatin  (LIPITOR) 80 mg, Daily at bedtime   carvedilol  (COREG ) 12.5 mg, Oral, Daily   cyanocobalamin (VITAMIN B12) 1,000 mcg, Daily   furosemide  (LASIX ) 20 mg, Oral, Daily  levothyroxine  (SYNTHROID ) 112 mcg, Daily before breakfast   losartan  (COZAAR ) 50 mg, Oral, Daily   metFORMIN  (GLUCOPHAGE ) 1,000 mg, Oral, 2 times daily with meals   Multiple Vitamin (MULTIVITAMIN WITH MINERALS) TABS tablet 1 tablet, Daily   OVER THE COUNTER MEDICATION 1 application , At bedtime PRN   potassium chloride  (KLOR-CON  M10) 10 MEQ tablet 10 mEq, Oral, Daily   tadalafil  (CIALIS ) 20 mg, Oral, Daily PRN   vitamin E 400 Units, Daily       Objective:   Physical Exam BP 122/80   Pulse (!) 50   Temp 98 F (36.7 C) (Oral)   Resp 18   Ht 6' 2 (1.88 m)   Wt 234 lb 2 oz (106.2 kg)   SpO2 96%   BMI 30.06 kg/m  General: Well developed, NAD, BMI noted Neck: No  thyromegaly  HEENT:  Normocephalic . Face symmetric, atraumatic Lungs:  CTA B Normal respiratory effort, no intercostal retractions, no accessory muscle use. Heart: RRR,  no murmur.  Abdomen:  Not distended, soft, non-tender. No rebound or rigidity.   Lower extremities: No pitting edema.   Skin: Exposed areas without rash. Not pale. Not jaundice Neurologic:  alert & oriented X3.  Speech normal, gait appropriate for age and unassisted Strength symmetric and appropriate for age.  Psych: Cognition and judgment appear intact.  Cooperative with  normal attention span and concentration.  Behavior appropriate. No anxious or depressed appearing.     Assessment     Assessment DM,  actos  intolerant 01-2018, sees Dr Faythe + Mild neuropathy on exam 03/2017 (used to be on gaba for muscle cramps) HTN Hyperlipidemia Hypothyroidism h/oThyroid cancer, nodularity, no mets, thyroidectomy 2013- sees Dr Faythe yearly as off 05/2020 CV: --CHF/nonischemic cardiomyopathy dx 2011 --Ventricular tachycardia, 2011,  EF  ~15 -20 % -- s/p  biventricular ICD 2011--- replaced 04-2016 --CAD -Nonobstructive   by cath 08-2014 --LBBB  GI: --GERD --H/o anemia:  --Gastric ulcer per EGD 07-2010 DJD (hip pain, XRs dosne 2017) Gout H/o asthma as a child  Large L DVT: 11/11/2023 Mechanical thrombectomy f/u by Angioplasty stenting L common and external iliac veins  PLAN Here for CPX - Td 11-2021 - PNM 23: 2011-2018--2021; PNM 20: 05/2022 - zostavax 2016; s/p shingrix  - had a flu shot -covid -RSV few months ago per patient.  CCS:  Colonoscopy 9- 2011 4 polyps, s/p Cscope 07-2021, next 7 years per path report -Prostate cancer screening:  no sxs, check PSA -Labs: BMP LFT CBC uric acid PSA - Healthcare POA: See AVS I address other issues as well: L DVT 11/11/2023: Saw vascular surgery on f/u 12/06/2023.  They recommended Eliquis  for at least 6 months and aspirin  indefinitely. Next follow-up 05-2024.  Again consider hematology referral at some point. DM: Per Endo Neuropathy: Still have symptoms but he is satisfied with tart cherry juice and CBD Gummies treatment, declines gabapentin . Nephropathy: + Microalbumin, recent DVT may be related to albuminuria.  Refer to nephrology. HTN: Rec  to check BPs, BP today is okay, continue present care.  Checking labs. High cholesterol: Recently Endo increase Lipitor from 40 mg to 80 mg, check LFTs. Thyroid  disease: Recently Endo recommend to take levothyroxine  112 mcg: 1.5 tablets daily Gout: On allopurinol , no recent events,  check uric acid. RTC 4 months

## 2024-01-06 NOTE — Patient Instructions (Signed)
  Check the  blood pressure regularly Blood pressure goal:  between 110/65 and  135/85. If it is consistently higher or lower, let me know     GO TO THE LAB : Get the blood work     Next visit with me in 4 months Please schedule it at the front desk        "Health Care Power of attorney" ,  "Living will" (Advance care planning documents)  If you already have a living will or healthcare power of attorney, is recommended you bring the copy to be scanned in your chart.   The document will be available to all the doctors you see in the system.  Advance care planning is a process that supports adults in  understanding and sharing their preferences regarding future medical care.  The patient's preferences are recorded in documents called Advance Directives and the can be modified at any time while the patient is in full mental capacity.   If you don't have one, please consider create one.      More information at: [email or web address]

## 2024-01-07 ENCOUNTER — Encounter: Payer: Self-pay | Admitting: Internal Medicine

## 2024-01-07 NOTE — Assessment & Plan Note (Signed)
 Here for CPX  I address other issues as well: L DVT 11/11/2023: Saw vascular surgery on f/u 12/06/2023.  They recommended Eliquis  for at least 6 months and aspirin  indefinitely. Next follow-up 05-2024.  Again consider hematology referral at some point. DM: Per Endo Neuropathy: Still have symptoms but he is satisfied with tart cherry juice and CBD Gummies treatment, declines gabapentin . Nephropathy: + Microalbumin, recent DVT may be related to albuminuria.  Refer to nephrology. HTN: Rec  to check BPs, BP today is okay, continue present care.  Checking labs. High cholesterol: Recently Endo increase Lipitor from 40 mg to 80 mg, check LFTs. Thyroid  disease: Recently Endo recommend to take levothyroxine  112 mcg: 1.5 tablets daily Gout: On allopurinol , no recent events, check uric acid. RTC 4 months

## 2024-01-07 NOTE — Assessment & Plan Note (Signed)
 Here for CPX - Td 11-2021 - PNM 23: 2011-2018--2021; PNM 20: 05/2022 - zostavax 2016; s/p shingrix  - had a flu shot -covid -RSV few months ago per patient.  CCS:  Colonoscopy 9- 2011 4 polyps, s/p Cscope 07-2021, next 7 years per path report -Prostate cancer screening:  no sxs, check PSA -Labs: BMP LFT CBC uric acid PSA - Healthcare POA: See AVS

## 2024-01-09 ENCOUNTER — Encounter: Payer: Self-pay | Admitting: Internal Medicine

## 2024-01-13 ENCOUNTER — Telehealth: Payer: Self-pay | Admitting: Internal Medicine

## 2024-01-13 NOTE — Telephone Encounter (Signed)
Copied from CRM [id number]. Topic: Medicare AWV >> Jan 13, 2024 11:32 AM Payton Doughty wrote: Reason for CRM: Called LVM 01/13/2024 to schedule AWV. Please schedule Virtual or Telehealth visits ONLY.   Verlee Rossetti; Care Guide Ambulatory Clinical Support [place] [facility] Direct Dial: [phone]

## 2024-01-16 NOTE — Progress Notes (Signed)
 No ICM remote transmission received for 01/09/2024 and next ICM transmission scheduled for 01/23/2024.

## 2024-01-20 ENCOUNTER — Encounter: Payer: Self-pay | Admitting: Internal Medicine

## 2024-01-20 ENCOUNTER — Ambulatory Visit (INDEPENDENT_AMBULATORY_CARE_PROVIDER_SITE_OTHER): Payer: 59

## 2024-01-20 DIAGNOSIS — I428 Other cardiomyopathies: Secondary | ICD-10-CM

## 2024-01-20 LAB — CUP PACEART REMOTE DEVICE CHECK
Battery Remaining Longevity: 6 mo
Battery Voltage: 2.83 V
Brady Statistic AP VP Percent: 1.96 %
Brady Statistic AP VS Percent: 0.01 %
Brady Statistic AS VP Percent: 98.02 %
Brady Statistic AS VS Percent: 0.01 %
Brady Statistic RA Percent Paced: 1.97 %
Brady Statistic RV Percent Paced: 99.97 %
Date Time Interrogation Session: 20250221033323
HighPow Impedance: 44 Ohm
HighPow Impedance: 53 Ohm
Implantable Lead Connection Status: 753985
Implantable Lead Connection Status: 753985
Implantable Lead Connection Status: 753985
Implantable Lead Implant Date: 20110516
Implantable Lead Implant Date: 20110516
Implantable Lead Implant Date: 20110516
Implantable Lead Location: 753858
Implantable Lead Location: 753859
Implantable Lead Location: 753860
Implantable Lead Model: 5076
Implantable Lead Model: 6947
Implantable Pulse Generator Implant Date: 20170623
Lead Channel Impedance Value: 266 Ohm
Lead Channel Impedance Value: 323 Ohm
Lead Channel Impedance Value: 399 Ohm
Lead Channel Impedance Value: 437 Ohm
Lead Channel Impedance Value: 570 Ohm
Lead Channel Impedance Value: 874 Ohm
Lead Channel Pacing Threshold Amplitude: 0.75 V
Lead Channel Pacing Threshold Amplitude: 0.875 V
Lead Channel Pacing Threshold Amplitude: 1 V
Lead Channel Pacing Threshold Pulse Width: 0.4 ms
Lead Channel Pacing Threshold Pulse Width: 0.4 ms
Lead Channel Pacing Threshold Pulse Width: 0.6 ms
Lead Channel Sensing Intrinsic Amplitude: 1.75 mV
Lead Channel Sensing Intrinsic Amplitude: 1.75 mV
Lead Channel Sensing Intrinsic Amplitude: 8.375 mV
Lead Channel Sensing Intrinsic Amplitude: 8.375 mV
Lead Channel Setting Pacing Amplitude: 1.5 V
Lead Channel Setting Pacing Amplitude: 2 V
Lead Channel Setting Pacing Amplitude: 2.5 V
Lead Channel Setting Pacing Pulse Width: 0.4 ms
Lead Channel Setting Pacing Pulse Width: 0.6 ms
Lead Channel Setting Sensing Sensitivity: 0.3 mV
Zone Setting Status: 755011

## 2024-01-23 ENCOUNTER — Ambulatory Visit: Payer: PPO | Attending: Internal Medicine

## 2024-01-23 DIAGNOSIS — I5022 Chronic systolic (congestive) heart failure: Secondary | ICD-10-CM

## 2024-01-23 DIAGNOSIS — Z95810 Presence of automatic (implantable) cardiac defibrillator: Secondary | ICD-10-CM | POA: Diagnosis not present

## 2024-01-25 NOTE — Progress Notes (Signed)
 EPIC Encounter for ICM Monitoring  Patient Name: Mike Jimenez is a 73 y.o. male Date: 01/25/2024 Primary Care Physican: Wanda Plump, MD Primary Cardiologist: Ladona Ridgel Electrophysiologist: Rae Roam Pacing:  98.5% 01/25/2024 Weight: 230-235 lbs   Clinical Status Since 22-Nov-2023 AT/AF 19 episodes Time in AT/AF  <0.1 hr/day (<0.1%) Longest AT/AF 30 minutes   Battery ERI: 6 months                                            Spoke with patient and heart failure questions reviewed.  Transmission results reviewed.  Pt asymptomatic for fluid accumulation.  Reports feeling well at this time and voices no complaints.     OptiVol Thoracic impedance suggesting normal fluid levels within the last month.   Prescribed:  Furosemide 20 mg 1 tablet (20 mg total) daily.   Potassium 10 mEq 1 tablet (10 mEq total) daily   Labs: 11/16/2023 Creatinine 1.00, BUN 12, Potassium 4.4, Sodium 139  11/13/2023 Creatinine 1.28, BUN 19, Potassium 4.2, Sodium 137, GFR 60 11/12/2023 Creatinine 1.13, BUN 18, Potassium 3.8, Sodium 134, GFR >60  11/11/2023 Creatinine 1.27, BUN 21, Potassium 4.0, Sodium 139, GFR >60  11/10/2023 Creatinine 1.15, BUN 22, Potassium 3.8, Sodium 138, GFR >60  11/09/2023 Creatinine 1.09, BUN 24, Potassium 4.1, Sodium 136, GFR 11  09/08/2023 Creatinine 1.00, BUN 18, Potassium 4.2, Sodium 143, GFR >60 01/14/2023 Creatinine 1.06, BUN 16, Potassium 4.1, Sodium 141  01/03/2023 Creatinine 1.01, BUN 16, Potassium 4.1, Sodium 142  A complete set of results can be found in Results Review.   Recommendations:  No changes and encouraged to call if experiencing any fluid symptoms.   Follow-up plan: ICM clinic phone appointment on 02/27/2024.   91 day device clinic remote transmission 03/21/2024.     EP/Cardiology Office Visits:  Last OV with Dr Ladona Ridgel was 10/25/2023 (no recall)   Copy of ICM check sent to Dr. Ladona Ridgel.  3 month ICM trend: 01/20/2024.    12-14 Month ICM trend:     Karie Soda, RN 01/25/2024 4:24 PM

## 2024-02-02 ENCOUNTER — Encounter: Payer: Self-pay | Admitting: Vascular Surgery

## 2024-02-03 ENCOUNTER — Telehealth: Payer: Self-pay

## 2024-02-03 NOTE — Telephone Encounter (Signed)
 Attempted to reach pt to further discuss his MyChart message regarding his "occasional" leg pain with redness. I left a VM for him to return our call so we could gather more information to help him.

## 2024-02-09 DIAGNOSIS — E119 Type 2 diabetes mellitus without complications: Secondary | ICD-10-CM | POA: Diagnosis not present

## 2024-02-09 DIAGNOSIS — Z85850 Personal history of malignant neoplasm of thyroid: Secondary | ICD-10-CM | POA: Diagnosis not present

## 2024-02-09 DIAGNOSIS — E890 Postprocedural hypothyroidism: Secondary | ICD-10-CM | POA: Diagnosis not present

## 2024-02-09 LAB — HM DIABETES FOOT EXAM

## 2024-02-20 ENCOUNTER — Ambulatory Visit: Payer: 59

## 2024-02-20 ENCOUNTER — Encounter: Payer: Self-pay | Admitting: Internal Medicine

## 2024-02-20 DIAGNOSIS — I5022 Chronic systolic (congestive) heart failure: Secondary | ICD-10-CM

## 2024-02-20 DIAGNOSIS — I428 Other cardiomyopathies: Secondary | ICD-10-CM

## 2024-02-20 LAB — CUP PACEART REMOTE DEVICE CHECK
Battery Remaining Longevity: 3 mo
Battery Voltage: 2.83 V
Brady Statistic AP VP Percent: 1.13 %
Brady Statistic AP VS Percent: 0.01 %
Brady Statistic AS VP Percent: 98.85 %
Brady Statistic AS VS Percent: 0.02 %
Brady Statistic RA Percent Paced: 1.14 %
Brady Statistic RV Percent Paced: 99.94 %
Date Time Interrogation Session: 20250324022604
HighPow Impedance: 43 Ohm
HighPow Impedance: 57 Ohm
Implantable Lead Connection Status: 753985
Implantable Lead Connection Status: 753985
Implantable Lead Connection Status: 753985
Implantable Lead Implant Date: 20110516
Implantable Lead Implant Date: 20110516
Implantable Lead Implant Date: 20110516
Implantable Lead Location: 753858
Implantable Lead Location: 753859
Implantable Lead Location: 753860
Implantable Lead Model: 5076
Implantable Lead Model: 6947
Implantable Pulse Generator Implant Date: 20170623
Lead Channel Impedance Value: 266 Ohm
Lead Channel Impedance Value: 323 Ohm
Lead Channel Impedance Value: 399 Ohm
Lead Channel Impedance Value: 399 Ohm
Lead Channel Impedance Value: 551 Ohm
Lead Channel Impedance Value: 817 Ohm
Lead Channel Pacing Threshold Amplitude: 0.75 V
Lead Channel Pacing Threshold Amplitude: 0.75 V
Lead Channel Pacing Threshold Amplitude: 1 V
Lead Channel Pacing Threshold Pulse Width: 0.4 ms
Lead Channel Pacing Threshold Pulse Width: 0.4 ms
Lead Channel Pacing Threshold Pulse Width: 0.6 ms
Lead Channel Sensing Intrinsic Amplitude: 1.875 mV
Lead Channel Sensing Intrinsic Amplitude: 1.875 mV
Lead Channel Sensing Intrinsic Amplitude: 8.375 mV
Lead Channel Sensing Intrinsic Amplitude: 8.375 mV
Lead Channel Setting Pacing Amplitude: 1.5 V
Lead Channel Setting Pacing Amplitude: 1.75 V
Lead Channel Setting Pacing Amplitude: 2.5 V
Lead Channel Setting Pacing Pulse Width: 0.4 ms
Lead Channel Setting Pacing Pulse Width: 0.6 ms
Lead Channel Setting Sensing Sensitivity: 0.3 mV
Zone Setting Status: 755011

## 2024-02-21 NOTE — Progress Notes (Signed)
 Remote ICD transmission.

## 2024-02-27 ENCOUNTER — Ambulatory Visit: Payer: PPO | Attending: Internal Medicine

## 2024-02-27 DIAGNOSIS — I5022 Chronic systolic (congestive) heart failure: Secondary | ICD-10-CM | POA: Diagnosis not present

## 2024-02-27 DIAGNOSIS — Z95810 Presence of automatic (implantable) cardiac defibrillator: Secondary | ICD-10-CM

## 2024-02-28 ENCOUNTER — Telehealth: Payer: Self-pay

## 2024-02-28 NOTE — Progress Notes (Signed)
 EPIC Encounter for ICM Monitoring  Patient Name: Mike Jimenez is a 73 y.o. male Date: 02/28/2024 Primary Care Physican: Wanda Plump, MD Primary Cardiologist: Ladona Ridgel Electrophysiologist: Rae Roam Pacing:  100% 01/25/2024 Weight: 230-235 lbs   Clinical Status Since 20-Feb-2024 Time in AT/AF  0.0 hr/day (0.0%)   Battery ERI: 4 months                                           Attempted call to patient and unable to reach.  Left detailed message per DPR regarding transmission.  Transmission results reviewed.     OptiVol Thoracic impedance suggesting possible fluid accumulation starting 3/15 and trending back close to baseline 4/1.   Prescribed:  Furosemide 20 mg 1 tablet (20 mg total) daily.   Potassium 10 mEq 1 tablet (10 mEq total) daily   Labs: 01/06/2024 Creatinine 1.06, BUN 15, Potassium 4.4, Sodium 141, GFR 70.40 11/16/2023 Creatinine 1.00, BUN 12, Potassium 4.4, Sodium 139  11/13/2023 Creatinine 1.28, BUN 19, Potassium 4.2, Sodium 137, GFR 60 11/12/2023 Creatinine 1.13, BUN 18, Potassium 3.8, Sodium 134, GFR >60  A complete set of results can be found in Results Review.   Recommendations:  Left voice mail with ICM number and encouraged to call if experiencing any fluid symptoms.   Follow-up plan: ICM clinic phone appointment on 04/02/2024.   91 day device clinic remote transmission 03/21/2024.     EP/Cardiology Office Visits:  Next EP appt due 09/2024 (Last OV with Dr Ladona Ridgel was 10/25/2023)   Copy of ICM check sent to Dr. Ladona Ridgel.  3 month ICM trend: 02/28/2024.    12-14 Month ICM trend:     Karie Soda, RN 02/28/2024 8:47 AM

## 2024-02-28 NOTE — Telephone Encounter (Signed)
 Remote ICM transmission received.  Attempted call to patient regarding ICM remote transmission and left detailed message per DPR.  Left ICM phone number and advised to return call for any fluid symptoms or questions. Next ICM remote transmission scheduled 04/02/2024.

## 2024-03-01 ENCOUNTER — Encounter: Payer: Self-pay | Admitting: Internal Medicine

## 2024-03-02 ENCOUNTER — Encounter: Payer: Self-pay | Admitting: Internal Medicine

## 2024-03-06 ENCOUNTER — Ambulatory Visit (INDEPENDENT_AMBULATORY_CARE_PROVIDER_SITE_OTHER): Payer: PPO

## 2024-03-06 VITALS — Ht 74.0 in | Wt 234.0 lb

## 2024-03-06 DIAGNOSIS — Z0000 Encounter for general adult medical examination without abnormal findings: Secondary | ICD-10-CM

## 2024-03-06 NOTE — Progress Notes (Signed)
 Subjective:   Mike Jimenez is a 73 y.o. who presents for a Medicare Wellness preventive visit.  Visit Complete: Virtual I connected with  Mike Jimenez on 03/06/24 by a audio enabled telemedicine application and verified that I am speaking with the correct person using two identifiers.  Patient Location: Home  Provider Location: Home Office  I discussed the limitations of evaluation and management by telemedicine. The patient expressed understanding and agreed to proceed.  Vital Signs: Because this visit was a virtual/telehealth visit, some criteria may be missing or patient reported. Any vitals not documented were not able to be obtained and vitals that have been documented are patient reported.    Persons Participating in Visit: Patient.  AWV Questionnaire: Yes: Patient Medicare AWV questionnaire was completed by the patient on 02/28/24; I have confirmed that all information answered by patient is correct and no changes since this date.  Cardiac Risk Factors include: advanced age (>53men, >36 women);male gender;diabetes mellitus     Objective:    Today's Vitals   03/06/24 0853  Weight: 234 lb (106.1 kg)  Height: 6\' 2"  (1.88 m)   Body mass index is 30.04 kg/m.     03/06/2024    9:03 AM 11/12/2023   11:39 PM 11/09/2023    3:53 PM 02/01/2023    2:25 PM 01/28/2022    2:24 PM 08/06/2021    7:09 AM 05/15/2021    2:46 PM  Advanced Directives  Does Patient Have a Medical Advance Directive? Yes  No Yes Yes No No  Type of Estate agent of [place];Living will   Healthcare Power of [employer] Power of Attorney    Copy of Healthcare Power of Attorney in Chart? No - copy requested   No - copy requested No - copy requested    Would patient like information on creating a medical advance directive?  No - Patient declined     No - Patient declined    Current Medications (verified) Outpatient Encounter Medications as of 03/06/2024  Medication Sig   acetaminophen  (TYLENOL) 500 MG tablet Take 1,000 mg by mouth every 6 (six) hours as needed (pain).   allopurinol (ZYLOPRIM) 100 MG tablet Take 1 tablet (100 mg total) by mouth daily. (Patient taking differently: Take 100 mg by mouth at bedtime.)   apixaban (ELIQUIS) 5 MG TABS tablet Take 1 tablet (5 mg total) by mouth 2 (two) times daily.   aspirin EC 81 MG tablet Take 1 tablet (81 mg total) by mouth daily.   atorvastatin (LIPITOR) 80 MG tablet Take 80 mg by mouth at bedtime.   carvedilol (COREG) 12.5 MG tablet Take 1 tablet (12.5 mg total) by mouth daily.   cyanocobalamin (VITAMIN B12) 1000 MCG tablet Take 1,000 mcg by mouth daily.   furosemide (LASIX) 20 MG tablet Take 1 tablet (20 mg total) by mouth daily.   levothyroxine (SYNTHROID) 112 MCG tablet Take 112 mcg by mouth daily before breakfast.   losartan (COZAAR) 50 MG tablet Take 1 tablet (50 mg total) by mouth daily.   metFORMIN (GLUCOPHAGE) 1000 MG tablet Take 1 tablet (1,000 mg total) by mouth 2 (two) times daily with a meal.   Multiple Vitamin (MULTIVITAMIN WITH MINERALS) TABS tablet Take 1 tablet by mouth daily.   OVER THE COUNTER MEDICATION Apply 1 application  topically at bedtime as needed (CBD Cream for neuropathy in feet).   potassium chloride (KLOR-CON M10) 10 MEQ tablet Take 1 tablet (10 mEq total) by mouth daily.  tadalafil (CIALIS) 20 MG tablet Take 1 tablet (20 mg total) by mouth daily as needed for erectile dysfunction.   vitamin E 400 UNIT capsule Take 400 Units by mouth daily.   No facility-administered encounter medications on file as of 03/06/2024.    Allergies (verified) Bee venom, Amiodarone, and Ramipril   History: Past Medical History:  Diagnosis Date   Anemia    Arthritis    "knees" (09/05/2014)   Automatic implantable cardioverter-defibrillator in situ    CAD (coronary artery disease)    a. Mild nonobstructive CAD by cath 08/2014.   CHF (congestive heart failure) (HCC)    Childhood asthma    Gastric ulcer 2011   GERD  (gastroesophageal reflux disease)    History of gout    Hyperlipidemia    Hypertension    LBBB (left bundle branch block)    NICM (nonischemic cardiomyopathy) (HCC)    a. Dx 2011 - presented with sustained VT. Normal coronaries at that time, EF 10-15% by cath and 20-25% by echo. b. s/p biventricular ICD 2011. c. CP 08/2014: mild nonobstructive CAD.   Obesity    Postsurgical hypothyroidism    Thyroid cancer (HCC)    a. 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 Dr Gerrit Friends. s/p thyroidectomy 2013.   Torn meniscus    Type II diabetes mellitus (HCC)    Ventricular tachycardia (HCC)    a. Sustained VT 2011 s/p MDT [id number] Concert BiV ICD, ser# [id number].   Past Surgical History:  Procedure Laterality Date   BI-VENTRICULAR IMPLANTABLE CARDIOVERTER DEFIBRILLATOR  (CRT-D)  03/2010   BIOPSY  08/06/2021   Procedure: BIOPSY;  Surgeon: Hilarie Fredrickson, MD;  Location: [facility];  Service: Endoscopy;;   CARDIAC CATHETERIZATION  03/2010   COLONOSCOPY WITH PROPOFOL N/A 08/06/2021   Procedure: COLONOSCOPY WITH PROPOFOL;  Surgeon: Hilarie Fredrickson, MD;  Location: [facility];  Service: Endoscopy;  Laterality: N/A;   EP IMPLANTABLE DEVICE N/A 05/21/2016   Procedure:  ICD Generator Changeout;  Surgeon: Marinus Maw, MD;  Location: [facility] INVASIVE CV LAB;  Service: Cardiovascular;  Laterality: N/A;   INSERTION OF ILIAC STENT Left 11/12/2023   Procedure: INSERTION OF LEFT ILIAC VEIN STENT;  Surgeon: [name], MD;  Location: [facility] OR;  Service: Vascular;  Laterality: Left;   LEFT HEART CATHETERIZATION WITH CORONARY ANGIOGRAM N/A 09/06/2014   Procedure: LEFT HEART CATHETERIZATION WITH CORONARY ANGIOGRAM;  Surgeon: Kathleene Hazel, MD;  Location: [facility] CATH LAB;  Service: Cardiovascular;  Laterality: N/A;   LOWER EXTREMITY VENOGRAPHY Left 11/11/2023   Procedure: LOWER EXTREMITY VENOGRAPHY;  Surgeon: [name], MD;  Location: [facility] INVASIVE CV LAB;  Service: Cardiovascular;  Laterality: Left;    PERIPHERAL VASCULAR THROMBECTOMY Left 11/11/2023   Procedure: PERIPHERAL VASCULAR THROMBECTOMY;  Surgeon: [name], MD;  Location: [facility] INVASIVE CV LAB;  Service: Cardiovascular;  Laterality: Left;   PERIPHERAL VASCULAR ULTRASOUND/IVUS Left 11/11/2023   Procedure: Peripheral Vascular Ultrasound/IVUS;  Surgeon: [name], MD;  Location: [facility] INVASIVE CV LAB;  Service: Cardiovascular;  Laterality: Left;   POLYPECTOMY  08/06/2021   Procedure: POLYPECTOMY;  Surgeon: Hilarie Fredrickson, MD;  Location: [facility];  Service: Endoscopy;;   PTX     traumatic, age 65 months   THYROIDECTOMY  12/10/2011   Procedure: TOTAL THYROIDECTOMY WITH CENTRAL COMPARTMENT DISSECTION;  Surgeon: Velora Heckler, MD;  [facility];  Service: General;  Laterality: N/A;  Total thyroidectomy with limited central compartment lymph node dissection.   VASECTOMY  Family History  Problem Relation Age of Onset   Atrial fibrillation Father        diseased at ag 65   Hypertension Father        alive @ 81.   Hypothyroidism Mother    Hypertension Mother        alive @ 72.   Diabetes Maternal Grandfather    Diabetes Maternal Aunt    Colon cancer Maternal Aunt    Colon cancer Maternal Aunt    Lung cancer Paternal Uncle    Lung cancer Maternal Uncle    Asthma Sister    Hypothyroidism Daughter    Prostate cancer Neg Hx    CAD Neg Hx    Social History   Socioeconomic History   Marital status: Married    Spouse name: Not on file   Number of children: 2   Years of education: Not on file   Highest education level: Bachelor's degree (e.g., BA, AB, BS)  Occupational History   Occupation: [employer]   Occupation: to retire 2023  Tobacco Use   Smoking status: Former    Current packs/day: 0.00    Average packs/day: 2.5 packs/day for 2.0 years (5.0 ttl pk-yrs)    Types: Cigarettes    Start date: 11/29/1968    Quit date: 11/29/1970    Years since quitting: 53.3   Smokeless tobacco: Former    Types: Production designer, theatre/television/film status: Never Used  Substance and Sexual Activity   Alcohol use: Yes    Comment: drinks rarely   Drug use: No   Sexual activity: Yes  Other Topics Concern   Not on file  Social History Narrative   Live w/ wife , daughter [name] lives in [place]   11 dogs, fish pond.       Social Drivers of Corporate investment banker Strain: Low Risk  (03/06/2024)   Overall Financial Resource Strain (CARDIA)    Difficulty of Paying Living Expenses: Not hard at all  Food Insecurity: No Food Insecurity (03/06/2024)   Hunger Vital Sign    Worried About Running Out of Food in the Last Year: Never true    Ran Out of Food in the Last Year: Never true  Transportation Needs: No Transportation Needs (03/06/2024)   PRAPARE - Administrator, Civil Service (Medical): No    Lack of Transportation (Non-Medical): No  Physical Activity: Inactive (03/06/2024)   Exercise Vital Sign    Days of Exercise per Week: 0 days    Minutes of Exercise per Session: 0 min  Stress: No Stress Concern Present (03/06/2024)   [employer] of Occupational Health - Occupational Stress Questionnaire    Feeling of Stress : Not at all  Social Connections: Moderately Isolated (03/06/2024)   Social Connection and Isolation Panel [NHANES]    Frequency of Communication with Friends and Family: More than three times a week    Frequency of Social Gatherings with Friends and Family: More than three times a week    Attends Religious Services: Never    Database administrator or Organizations: No    Attends Engineer, structural: Never    Marital Status: Married    Tobacco Counseling Counseling given: Not Answered    Clinical Intake:  Pre-visit preparation completed: Yes  Pain : No/denies pain     BMI - recorded: 30.04 Nutritional Status: BMI > 30  Obese Nutritional Risks: None Diabetes: Yes CBG done?: No Did pt. bring in  CBG monitor from home?: No  Lab Results  Component Value Date   HGBA1C 8.4  12/14/2023   HGBA1C 6.2 08/11/2023   HGBA1C 8.0 11/10/2022     How often do you need to have someone help you when you read instructions, pamphlets, or other written materials from your doctor or pharmacy?: 1 - Never  Interpreter Needed?: No  Information entered by :: Theresa Mulligan LPN   Activities of Daily Living     03/06/2024    9:01 AM 02/28/2024    9:17 AM  In your present state of health, do you have any difficulty performing the following activities:  Hearing? 0 0  Vision? 0 0  Difficulty concentrating or making decisions? 0 0  Walking or climbing stairs? 0 0  Dressing or bathing? 0 0  Doing errands, shopping? 0 0  Preparing Food and eating ? N N  Using the Toilet? N N  In the past six months, have you accidently leaked urine? N N  Do you have problems with loss of bowel control? N N  Managing your Medications? N N  Managing your Finances? N N  Housekeeping or managing your Housekeeping? N N    Patient Care Team: Wanda Plump, MD as PCP - General (Internal Medicine) Marinus Maw, MD as PCP - Electrophysiology (Cardiology) Talmage Coin, MD as Consulting Physician (Endocrinology) Billie Ruddy, OD as Referring Physician (Optometry) Hilarie Fredrickson, MD as Consulting Physician (Gastroenterology)  Indicate any recent Medical Services you may have received from other than [facility] providers in the past year (date may be approximate).     Assessment:   This is a routine wellness examination for [place].  Hearing/Vision screen Hearing Screening - Comments:: Denies hearing difficulties   Vision Screening - Comments:: Wears rx glasses - up to date with routine eye exams with  [facility]   Goals Addressed               This Visit's Progress     Increase physical activity (pt-stated)        Remain active.       Depression Screen     03/06/2024    9:01 AM 01/06/2024    9:50 AM 11/09/2023    2:37 PM 02/01/2023    2:28 PM 01/03/2023    8:15 AM 01/28/2022    2:26 PM  12/31/2021    7:58 AM  PHQ 2/9 Scores  PHQ - 2 Score 0 0 0 0 0 0 0    Fall Risk     03/06/2024    9:02 AM 02/28/2024    9:17 AM 01/06/2024    9:49 AM 11/09/2023    2:37 PM 02/01/2023    2:26 PM  Fall Risk   Falls in the past year? 1 1 0 0 0  Number falls in past yr: 0 0 0 0 0  Injury with Fall? 0 0 0 0 0  Risk for fall due to : No Fall Risks    No Fall Risks  Follow up Falls prevention discussed;Falls evaluation completed  Falls evaluation completed;Education provided Falls evaluation completed;Education provided Falls evaluation completed    MEDICARE RISK AT HOME:  Medicare Risk at Home Any stairs in or around the home?: Yes If so, are there any without handrails?: No Home free of loose throw rugs in walkways, pet beds, electrical cords, etc?: No Life alert?: No Use of a cane, walker or w/c?: No Grab bars in the bathroom?: No Shower chair  or bench in shower?: Yes Elevated toilet seat or a handicapped toilet?: Yes  TIMED UP AND GO:  Was the test performed?  No  Cognitive Function: 6CIT completed    01/01/2019    9:32 AM  MMSE - Mini Mental State Exam  Orientation to time 5  Orientation to Place 5  Registration 3  Attention/ Calculation 5  Recall 3  Language- name 2 objects 2  Language- repeat 1  Language- follow 3 step command 3  Language- read & follow direction 1  Write a sentence 1  Copy design 1  Total score 30        03/06/2024    9:03 AM 02/01/2023    2:33 PM  6CIT Screen  What Year? 0 points 0 points  What month? 0 points 0 points  What time? 0 points 0 points  Count back from 20 0 points 0 points  Months in reverse 0 points 0 points  Repeat phrase 0 points 0 points  Total Score 0 points 0 points    Immunizations Immunization History  Administered Date(s) Administered   Influenza Split 09/08/2011, 07/20/2018, 08/28/2021   Influenza Whole 08/17/2010   Influenza, High Dose Seasonal PF 07/11/2017, 07/16/2019, 08/01/2020, 08/27/2022   Influenza,inj,Quad  PF,6+ Mos 08/29/2013   Influenza-Unspecified 07/30/2014, 07/31/2015, 07/30/2016, 08/29/2023   PFIZER(Purple Top)SARS-COV-2 Vaccination 02/10/2020, 03/01/2020, 09/13/2020   PNEUMOCOCCAL CONJUGATE-20 06/04/2022   PPD Test 09/17/2011   Pfizer Covid-19 Vaccine Bivalent Booster 77yrs & up 08/28/2021   Pfizer(Comirnaty)Fall Seasonal Vaccine 12 years and older 09/12/2022, 08/29/2023   Pneumococcal Conjugate-13 11/20/2014   Pneumococcal Polysaccharide-23 04/23/2010, 04/11/2017, 06/23/2020   Respiratory Syncytial Virus Vaccine,Recomb Aduvanted(Arexvy) 08/29/2023   Tdap 01/23/2014, 06/23/2019, 12/09/2021   Zoster Recombinant(Shingrix) 11/20/2018, 01/21/2019   Zoster, Live 11/28/2015    Screening Tests Health Maintenance  Topic Date Due   COVID-19 Vaccine (7 - Pfizer risk 2024-25 season) 05/09/2024 (Originally 02/26/2024)   HEMOGLOBIN A1C  06/12/2024   INFLUENZA VACCINE  06/29/2024   Diabetic kidney evaluation - Urine ACR  11/15/2024   OPHTHALMOLOGY EXAM  12/18/2024   Diabetic kidney evaluation - eGFR measurement  01/05/2025   FOOT EXAM  02/08/2025   Medicare Annual Wellness (AWV)  03/06/2025   Colonoscopy  08/06/2026   DTaP/Tdap/Td (4 - Td or Tdap) 12/10/2031   Pneumonia Vaccine 79+ Years old  Completed   Hepatitis C Screening  Completed   Zoster Vaccines- Shingrix  Completed   HPV VACCINES  Aged Out    Health Maintenance  There are no preventive care reminders to display for this patient. Health Maintenance Items Addressed:    Additional Screening:  Vision Screening: Recommended annual ophthalmology exams for early detection of glaucoma and other disorders of the eye.  Dental Screening: Recommended annual dental exams for proper oral hygiene  Community Resource Referral / Chronic Care Management: CRR required this visit?  No   CCM required this visit?  No     Plan:     I have personally reviewed and noted the following in the patient's chart:   Medical and social  history Use of alcohol, tobacco or illicit drugs  Current medications and supplements including opioid prescriptions. Patient is not currently taking opioid prescriptions. Functional ability and status Nutritional status Physical activity Advanced directives List of other physicians Hospitalizations, surgeries, and ER visits in previous 12 months Vitals Screenings to include cognitive, depression, and falls Referrals and appointments  In addition, I have reviewed and discussed with patient certain preventive protocols, quality metrics, and best practice  recommendations. A written personalized care plan for preventive services as well as general preventive health recommendations were provided to patient.     Tillie Rung, LPN   08/06/1190   After Visit Summary: (MyChart) Due to this being a telephonic visit, the after visit summary with patients personalized plan was offered to patient via MyChart   Notes: Nothing significant to report at this time.

## 2024-03-06 NOTE — Patient Instructions (Addendum)
 Mike Jimenez , Thank you for taking time to come for your Medicare Wellness Visit. I appreciate your ongoing commitment to your health goals. Please review the following plan we discussed and let me know if I can assist you in the future.   Referrals/Orders/Follow-Ups/Clinician Recommendations:   This is a list of the screening recommended for you and due dates:  Health Maintenance  Topic Date Due   COVID-19 Vaccine (7 - Pfizer risk 2024-25 season) 05/09/2024*   Hemoglobin A1C  06/12/2024   Flu Shot  06/29/2024   Yearly kidney health urinalysis for diabetes  11/15/2024   Eye exam for diabetics  12/18/2024   Yearly kidney function blood test for diabetes  01/05/2025   Complete foot exam   02/08/2025   Medicare Annual Wellness Visit  03/06/2025   Colon Cancer Screening  08/06/2026   DTaP/Tdap/Td vaccine (4 - Td or Tdap) 12/10/2031   Pneumonia Vaccine  Completed   Hepatitis C Screening  Completed   Zoster (Shingles) Vaccine  Completed   HPV Vaccine  Aged Out  *Topic was postponed. The date shown is not the original due date.    Advanced directives: (Copy Requested) Please bring a copy of your health care power of attorney and living will to the office to be added to your chart at your convenience. You can mail to [facility] [address] [place], [place] [address] or email to [email or web address]  Next Medicare Annual Wellness Visit scheduled for next year: Yes

## 2024-03-12 ENCOUNTER — Other Ambulatory Visit: Payer: Self-pay | Admitting: Internal Medicine

## 2024-03-22 DIAGNOSIS — E1165 Type 2 diabetes mellitus with hyperglycemia: Secondary | ICD-10-CM | POA: Diagnosis not present

## 2024-03-22 LAB — HEMOGLOBIN A1C: Hemoglobin A1C: 10.5

## 2024-04-02 ENCOUNTER — Ambulatory Visit: Attending: Internal Medicine

## 2024-04-02 DIAGNOSIS — Z95810 Presence of automatic (implantable) cardiac defibrillator: Secondary | ICD-10-CM

## 2024-04-02 DIAGNOSIS — I5022 Chronic systolic (congestive) heart failure: Secondary | ICD-10-CM

## 2024-04-03 ENCOUNTER — Telehealth: Payer: Self-pay

## 2024-04-03 NOTE — Telephone Encounter (Signed)
 Spoke with patient and advised automatic ICM remote transmission was not received 5/5.  He will send a report manually today to check monthly fluid levels.

## 2024-04-04 ENCOUNTER — Telehealth: Payer: Self-pay

## 2024-04-04 NOTE — Progress Notes (Signed)
 EPIC Encounter for ICM Monitoring  Patient Name: Mike Jimenez is a 73 y.o. male Date: 04/04/2024 Primary Care Physican: Ezell Hollow, MD Primary Cardiologist: Carolynne Citron Electrophysiologist: Arvid Latino Pacing:  99.6% 01/25/2024 Weight: 230-235 lbs 03/06/2024 Office Weight: 234 lbs   Clinical Status  Since 28-Feb-2024 Time in AT/AF  0.0 hr/day (0.0%)   Battery ERI: 4 months                                           Attempted call to patient and unable to reach.  Left detailed message per DPR regarding transmission.  Transmission results reviewed.      OptiVol Thoracic impedance suggesting possible fluid accumulation starting 4/23 and trending back closer to baseline 5/6.   Prescribed:  Furosemide  20 mg 1 tablet (20 mg total) daily.   Potassium 10 mEq 1 tablet (10 mEq total) daily   Labs: 01/06/2024 Creatinine 1.06, BUN 15, Potassium 4.4, Sodium 141, GFR 70.40 11/16/2023 Creatinine 1.00, BUN 12, Potassium 4.4, Sodium 139  11/13/2023 Creatinine 1.28, BUN 19, Potassium 4.2, Sodium 137, GFR 60 11/12/2023 Creatinine 1.13, BUN 18, Potassium 3.8, Sodium 134, GFR >60  A complete set of results can be found in Results Review.   Recommendations:  Left voice mail with ICM number and encouraged to call if experiencing any fluid symptoms.   Follow-up plan: ICM clinic phone appointment on 04/09/2024 to recheck fluid levels.   91 day device clinic remote transmission 06/21/2024.     EP/Cardiology Office Visits:  Next EP appt due 09/2024 (Last OV with Dr Carolynne Citron was 10/25/2023)   Copy of ICM check sent to Dr. Carolynne Citron.  3 month ICM trend: 04/03/2024.    12-14 Month ICM trend:     Almyra Jain, RN 04/04/2024 10:02 AM

## 2024-04-04 NOTE — Telephone Encounter (Signed)
 Remote ICM transmission received.  Attempted call to patient regarding ICM remote transmission and left detailed message per DPR.  Left ICM phone number and advised to return call for any fluid symptoms or questions. Next ICM remote transmission scheduled 04/09/2024.

## 2024-04-05 NOTE — Progress Notes (Signed)
 Remote ICD transmission.

## 2024-04-09 ENCOUNTER — Ambulatory Visit: Attending: Internal Medicine

## 2024-04-09 DIAGNOSIS — Z95810 Presence of automatic (implantable) cardiac defibrillator: Secondary | ICD-10-CM

## 2024-04-09 DIAGNOSIS — I5022 Chronic systolic (congestive) heart failure: Secondary | ICD-10-CM

## 2024-04-10 ENCOUNTER — Telehealth: Payer: Self-pay

## 2024-04-10 NOTE — Telephone Encounter (Signed)
 Remote ICM transmission received.  Attempted call to patient regarding ICM remote transmission and left detailed message per DPR.  Left ICM phone number and advised to return call for any fluid symptoms or questions. Next ICM remote transmission scheduled 04/16/2024.

## 2024-04-10 NOTE — Progress Notes (Signed)
 EPIC Encounter for ICM Monitoring  Patient Name: Mike Jimenez is a 73 y.o. male Date: 04/10/2024 Primary Care Physican: Ezell Hollow, MD Primary Cardiologist: Carolynne Citron Electrophysiologist: Arvid Latino Pacing:  99.6% 01/25/2024 Weight: 230-235 lbs 03/06/2024 Office Weight: 234 lbs   Clinical Status  Since 28-Feb-2024 Time in AT/AF  0.0 hr/day (0.0%)   Battery ERI: 4 months                                           Attempted call to patient and unable to reach.  Left detailed message per DPR regarding transmission.  Transmission results reviewed.    OptiVol Thoracic impedance continues to suggest possible fluid accumulation starting 4/23 although trending closer to baseline starting 5/6.   Prescribed:  Furosemide  20 mg 1 tablet (20 mg total) daily.   Potassium 10 mEq 1 tablet (10 mEq total) daily   Labs: 01/06/2024 Creatinine 1.06, BUN 15, Potassium 4.4, Sodium 141, GFR 70.40 11/16/2023 Creatinine 1.00, BUN 12, Potassium 4.4, Sodium 139  11/13/2023 Creatinine 1.28, BUN 19, Potassium 4.2, Sodium 137, GFR 60 11/12/2023 Creatinine 1.13, BUN 18, Potassium 3.8, Sodium 134, GFR >60  A complete set of results can be found in Results Review.   Recommendations:  Left voice mail with ICM number and encouraged to call if experiencing any fluid symptoms.   Follow-up plan: ICM clinic phone appointment on 04/16/2024 to recheck fluid levels.   91 day device clinic remote transmission 06/21/2024.     EP/Cardiology Office Visits:  Next EP appt due 09/2024 (Last OV with Dr Carolynne Citron was 10/25/2023)   Copy of ICM check sent to Dr. Carolynne Citron.  3 month ICM trend: 04/09/2024.    12-14 Month ICM trend:     Almyra Jain, RN 04/10/2024 8:50 AM

## 2024-04-16 ENCOUNTER — Ambulatory Visit: Attending: Internal Medicine

## 2024-04-16 ENCOUNTER — Telehealth: Payer: Self-pay

## 2024-04-16 DIAGNOSIS — I5022 Chronic systolic (congestive) heart failure: Secondary | ICD-10-CM

## 2024-04-16 DIAGNOSIS — Z95810 Presence of automatic (implantable) cardiac defibrillator: Secondary | ICD-10-CM

## 2024-04-16 NOTE — Progress Notes (Signed)
 EPIC Encounter for ICM Monitoring  Patient Name: Mike Jimenez is a 73 y.o. male Date: 04/16/2024 Primary Care Physican: Ezell Hollow, MD Primary Cardiologist: Carolynne Citron Electrophysiologist: Arvid Latino Pacing:  100% 01/25/2024 Weight: 230-235 lbs 03/06/2024 Office Weight: 234 lbs   Clinical Status  Since 09-Apr-2024 Time in AT/AF  0.0 hr/day (0.0%)   Battery ERI: 3 months                                           Attempted call to patient and unable to reach.  Left detailed message per DPR regarding transmission.  Transmission results reviewed.    OptiVol Thoracic impedance trending closer to baseline but still suggesting possible fluid accumulation starting 4/22   Prescribed:  Furosemide  20 mg 1 tablet (20 mg total) daily.   Potassium 10 mEq 1 tablet (10 mEq total) daily   Labs: 01/06/2024 Creatinine 1.06, BUN 15, Potassium 4.4, Sodium 141, GFR 70.40 11/16/2023 Creatinine 1.00, BUN 12, Potassium 4.4, Sodium 139  11/13/2023 Creatinine 1.28, BUN 19, Potassium 4.2, Sodium 137, GFR 60 11/12/2023 Creatinine 1.13, BUN 18, Potassium 3.8, Sodium 134, GFR >60  A complete set of results can be found in Results Review.   Recommendations:  Left voice mail with ICM number and encouraged to call if experiencing any fluid symptoms.   Follow-up plan: ICM clinic phone appointment on 05/07/2024.   91 day device clinic remote transmission 06/21/2024.     EP/Cardiology Office Visits:  Recall 10/09/2024 with Michaelle Adolphus, PA   Copy of ICM check sent to Dr. Carolynne Citron.  3 month ICM trend: 04/16/2024.    12-14 Month ICM trend:     Almyra Jain, RN 04/16/2024 10:15 AM

## 2024-04-16 NOTE — Telephone Encounter (Signed)
 Remote ICM transmission received.  Attempted call to patient regarding ICM remote transmission and left detailed message per DPR.  Left ICM phone number and advised to return call for any fluid symptoms or questions. Next ICM remote transmission scheduled 05/07/2024.

## 2024-04-20 ENCOUNTER — Ambulatory Visit (INDEPENDENT_AMBULATORY_CARE_PROVIDER_SITE_OTHER)

## 2024-04-20 DIAGNOSIS — I428 Other cardiomyopathies: Secondary | ICD-10-CM

## 2024-04-20 LAB — CUP PACEART REMOTE DEVICE CHECK
Battery Remaining Longevity: 3 mo
Battery Voltage: 2.81 V
Brady Statistic AP VP Percent: 2.95 %
Brady Statistic AP VS Percent: 0.01 %
Brady Statistic AS VP Percent: 97.03 %
Brady Statistic AS VS Percent: 0.01 %
Brady Statistic RA Percent Paced: 2.96 %
Brady Statistic RV Percent Paced: 99.97 %
Date Time Interrogation Session: 20250523042408
HighPow Impedance: 40 Ohm
HighPow Impedance: 50 Ohm
Implantable Lead Connection Status: 753985
Implantable Lead Connection Status: 753985
Implantable Lead Connection Status: 753985
Implantable Lead Implant Date: 20110516
Implantable Lead Implant Date: 20110516
Implantable Lead Implant Date: 20110516
Implantable Lead Location: 753858
Implantable Lead Location: 753859
Implantable Lead Location: 753860
Implantable Lead Model: 5076
Implantable Lead Model: 6947
Implantable Pulse Generator Implant Date: 20170623
Lead Channel Impedance Value: 285 Ohm
Lead Channel Impedance Value: 323 Ohm
Lead Channel Impedance Value: 399 Ohm
Lead Channel Impedance Value: 399 Ohm
Lead Channel Impedance Value: 513 Ohm
Lead Channel Impedance Value: 817 Ohm
Lead Channel Pacing Threshold Amplitude: 0.75 V
Lead Channel Pacing Threshold Amplitude: 0.75 V
Lead Channel Pacing Threshold Amplitude: 0.875 V
Lead Channel Pacing Threshold Pulse Width: 0.4 ms
Lead Channel Pacing Threshold Pulse Width: 0.4 ms
Lead Channel Pacing Threshold Pulse Width: 0.6 ms
Lead Channel Sensing Intrinsic Amplitude: 1.625 mV
Lead Channel Sensing Intrinsic Amplitude: 1.625 mV
Lead Channel Sensing Intrinsic Amplitude: 8.375 mV
Lead Channel Sensing Intrinsic Amplitude: 8.375 mV
Lead Channel Setting Pacing Amplitude: 1.5 V
Lead Channel Setting Pacing Amplitude: 1.75 V
Lead Channel Setting Pacing Amplitude: 2.5 V
Lead Channel Setting Pacing Pulse Width: 0.4 ms
Lead Channel Setting Pacing Pulse Width: 0.6 ms
Lead Channel Setting Sensing Sensitivity: 0.3 mV
Zone Setting Status: 755011

## 2024-04-22 ENCOUNTER — Ambulatory Visit: Payer: Self-pay | Admitting: Internal Medicine

## 2024-05-04 ENCOUNTER — Encounter: Payer: Self-pay | Admitting: Internal Medicine

## 2024-05-07 ENCOUNTER — Ambulatory Visit (INDEPENDENT_AMBULATORY_CARE_PROVIDER_SITE_OTHER): Payer: PPO | Admitting: Internal Medicine

## 2024-05-07 ENCOUNTER — Ambulatory Visit: Attending: Internal Medicine

## 2024-05-07 ENCOUNTER — Encounter: Payer: Self-pay | Admitting: Internal Medicine

## 2024-05-07 VITALS — BP 136/74 | HR 69 | Temp 97.7°F | Resp 16 | Ht 74.0 in | Wt 236.5 lb

## 2024-05-07 DIAGNOSIS — E785 Hyperlipidemia, unspecified: Secondary | ICD-10-CM | POA: Diagnosis not present

## 2024-05-07 DIAGNOSIS — E118 Type 2 diabetes mellitus with unspecified complications: Secondary | ICD-10-CM

## 2024-05-07 DIAGNOSIS — I5022 Chronic systolic (congestive) heart failure: Secondary | ICD-10-CM

## 2024-05-07 DIAGNOSIS — E039 Hypothyroidism, unspecified: Secondary | ICD-10-CM | POA: Diagnosis not present

## 2024-05-07 DIAGNOSIS — Z7984 Long term (current) use of oral hypoglycemic drugs: Secondary | ICD-10-CM

## 2024-05-07 DIAGNOSIS — I10 Essential (primary) hypertension: Secondary | ICD-10-CM

## 2024-05-07 DIAGNOSIS — L821 Other seborrheic keratosis: Secondary | ICD-10-CM

## 2024-05-07 DIAGNOSIS — Z95810 Presence of automatic (implantable) cardiac defibrillator: Secondary | ICD-10-CM | POA: Diagnosis not present

## 2024-05-07 DIAGNOSIS — K529 Noninfective gastroenteritis and colitis, unspecified: Secondary | ICD-10-CM | POA: Diagnosis not present

## 2024-05-07 LAB — LIPID PANEL
Cholesterol: 158 mg/dL (ref 0–200)
HDL: 38.9 mg/dL — ABNORMAL LOW (ref >39.00)
LDL Cholesterol: 76 mg/dL (ref 0–99)
NonHDL: 119.26
Total CHOL/HDL Ratio: 4
Triglycerides: 214 mg/dL — ABNORMAL HIGH (ref 0.0–149.0)
VLDL: 42.8 mg/dL — ABNORMAL HIGH (ref 0.0–40.0)

## 2024-05-07 LAB — BASIC METABOLIC PANEL WITH GFR
BUN: 15 mg/dL (ref 6–23)
CO2: 28 meq/L (ref 19–32)
Calcium: 8.9 mg/dL (ref 8.4–10.5)
Chloride: 102 meq/L (ref 96–112)
Creatinine, Ser: 1.02 mg/dL (ref 0.40–1.50)
GFR: 73.55 mL/min (ref >60.00)
Glucose, Bld: 171 mg/dL — ABNORMAL HIGH (ref 70–99)
Potassium: 4 meq/L (ref 3.5–5.1)
Sodium: 141 meq/L (ref 135–145)

## 2024-05-07 LAB — MICROALBUMIN / CREATININE URINE RATIO
Creatinine,U: 54.8 mg/dL
Microalb Creat Ratio: 1990.6 mg/g — ABNORMAL HIGH (ref 0.0–30.0)
Microalb, Ur: 109.1 mg/dL — ABNORMAL HIGH (ref 0.0–1.9)

## 2024-05-07 MED ORDER — LEVOTHYROXINE SODIUM 112 MCG PO TABS: 168.0000 ug | ORAL_TABLET | Freq: Every day | ORAL | Status: AC

## 2024-05-07 NOTE — Patient Instructions (Signed)
 Keep an eye on your skin lesions.  Let me know if they change in size, call or.  Also if they bleed or get irritated.   Please call the nephrology office and arrange an appointment to see about the protein in your urine [phone]   GO TO THE LAB :  Get the blood work   Your results will be posted on MyChart with my comments  Next office visit for a checkup in 6 months Please make an appointment before you leave today

## 2024-05-07 NOTE — Progress Notes (Unsigned)
 Subjective:    Patient ID: Mike Jimenez, male    DOB: 16-Jul-1951, 73 y.o.   MRN: [id number]  DOS:  05/07/2024 Type of visit - description: Follow-up  Medical problems addressed. In general feeling well. Still has occasional swelling of the left ankle.  Also, for about a year stools have been different, either loose or watery. No nausea or vomiting. No abdominal pain. No blood in the stools. Overall symptoms are better now.  He takes multiple meds.  Denies chest pain or difficulty breathing. Review of Systems See above   Past Medical History:  Diagnosis Date   Anemia    Arthritis    "knees" (09/05/2014)   Automatic implantable cardioverter-defibrillator in situ    CAD (coronary artery disease)    a. Mild nonobstructive CAD by cath 08/2014.   CHF (congestive heart failure) (HCC)    Childhood asthma    Gastric ulcer 2011   GERD (gastroesophageal reflux disease)    History of gout    Hyperlipidemia    Hypertension    LBBB (left bundle branch block)    NICM (nonischemic cardiomyopathy) (HCC)    a. Dx 2011 - presented with sustained VT. Normal coronaries at that time, EF 10-15% by cath and 20-25% by echo. b. s/p biventricular ICD 2011. c. CP 08/2014: mild nonobstructive CAD.   Obesity    Postsurgical hypothyroidism    Thyroid  cancer (HCC)    a. 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 Dr Sofia Dunn. s/p thyroidectomy 2013.   Torn meniscus    Type II diabetes mellitus (HCC)    Ventricular tachycardia (HCC)    a. Sustained VT 2011 s/p MDT D274TRK Concert BiV ICD, ser# [id number].    Past Surgical History:  Procedure Laterality Date   BI-VENTRICULAR IMPLANTABLE CARDIOVERTER DEFIBRILLATOR  (CRT-D)  03/2010   BIOPSY  08/06/2021   Procedure: BIOPSY;  Surgeon: Tobin Forts, MD;  Location: [facility];  Service: Endoscopy;;   CARDIAC CATHETERIZATION  03/2010   COLONOSCOPY WITH PROPOFOL  N/A 08/06/2021   Procedure: COLONOSCOPY WITH PROPOFOL ;  Surgeon: Tobin Forts, MD;   Location: [facility];  Service: Endoscopy;  Laterality: N/A;   EP IMPLANTABLE DEVICE N/A 05/21/2016   Procedure:  ICD Generator Changeout;  Surgeon: Tammie Fall, MD;  Location: [facility] INVASIVE CV LAB;  Service: Cardiovascular;  Laterality: N/A;   INSERTION OF ILIAC STENT Left 11/12/2023   Procedure: INSERTION OF LEFT ILIAC VEIN STENT;  Surgeon: Carlene Che, MD;  Location: [facility] OR;  Service: Vascular;  Laterality: Left;   LEFT HEART CATHETERIZATION WITH CORONARY ANGIOGRAM N/A 09/06/2014   Procedure: LEFT HEART CATHETERIZATION WITH CORONARY ANGIOGRAM;  Surgeon: Odie Benne, MD;  Location: [facility] C[facility] LAB;  Service: Cardiovascular;  Laterality: N/A;   LOWER EXTREMITY VENOGRAPHY Left 11/11/2023   Procedure: LOWER EXTREMITY VENOGRAPHY;  Surgeon: Carlene Che, MD;  Location: [facility] INVASIVE CV LAB;  Service: Cardiovascular;  Laterality: Left;   PERIPHERAL VASCULAR THROMBECTOMY Left 11/11/2023   Procedure: PERIPHERAL VASCULAR THROMBECTOMY;  Surgeon: Carlene Che, MD;  Location: [facility] INVASIVE CV LAB;  Service: Cardiovascular;  Laterality: Left;   PERIPHERAL VASCULAR ULTRASOUND/IVUS Left 11/11/2023   Procedure: Peripheral Vascular Ultrasound/IVUS;  Surgeon: Carlene Che, MD;  Location: [facility] INVASIVE CV LAB;  Service: Cardiovascular;  Laterality: Left;   POLYPECTOMY  08/06/2021   Procedure: POLYPECTOMY;  Surgeon: Tobin Forts, MD;  [facility]: [facility];  Service: Endoscopy;;   PTX     traumatic, age 52 months   THYROIDECTOMY  12/10/2011   Procedure: TOTAL THYROIDECTOMY WITH CENTRAL COMPARTMENT DISSECTION;  Surgeon: Keitha Pata, MD;  Location[facility];  Service: General;  Laterality: N/A;  Total thyroidectomy with limited central compartment lymph node dissection.   VASECTOMY      Current Outpatient Medications  Medication Instructions   acetaminophen  (TYLENOL ) 1,000 mg, Every 6 hours PRN   allopurinol  (ZYLOPRIM ) 100 mg, Oral, Daily   apixaban  (ELIQUIS ) 5 mg, Oral, 2 times daily    aspirin  EC 81 mg, Oral, Daily   atorvastatin  (LIPITOR) 80 mg, Daily at bedtime   carvedilol  (COREG ) 12.5 mg, Oral, Daily   cyanocobalamin (VITAMIN B12) 1,000 mcg, Daily   furosemide  (LASIX ) 20 mg, Oral, Daily   Lantus SoloStar 10 Units, Daily   levothyroxine  (SYNTHROID ) 112 mcg, Daily before breakfast   losartan  (COZAAR ) 50 mg, Oral, Daily   metFORMIN  (GLUCOPHAGE ) 1,000 mg, Oral, 2 times daily with meals   Multiple Vitamin (MULTIVITAMIN WITH MINERALS) TABS tablet 1 tablet, Daily   OVER THE COUNTER MEDICATION 1 application , At bedtime PRN   potassium chloride  (KLOR-CON  M10) 10 MEQ tablet 10 mEq, Oral, Daily   tadalafil  (CIALIS ) 20 mg, Oral, Daily PRN   vitamin E 400 Units, Daily       Objective:   Physical Exam BP 136/74   Pulse 69   Temp 97.7 F (36.5 C) (Oral)   Resp 16   Ht 6\' 2"  (1.88 m)   Wt 236 lb 8 oz (107.3 kg)   SpO2 94%   BMI 30.36 kg/m  General:   Well developed, NAD, BMI noted. HEENT:  Normocephalic . Face symmetric, atraumatic Lungs:  CTA B Normal respiratory effort, no intercostal retractions, no accessory muscle use. Heart: RRR,  no murmur.  Lower extremities: no pretibial edema bilaterally  Skin: Numerous skin lesions, he is concerned about the ones in the back, they are consistent with SKs. Neurologic:  alert & oriented X3.  Speech normal, gait appropriate for age and unassisted Psych--  Cognition and judgment appear intact.  Cooperative with normal attention span and concentration.  Behavior appropriate. No anxious or depressed appearing.      Assessment    Assessment DM,  actos  intolerant 01-2018, sees Dr Kathyanne Parkers + Mild neuropathy on exam 03/2017 (used to be on gaba for muscle cramps) HTN Hyperlipidemia Hypothyroidism h/oThyroid cancer, nodularity, no mets, thyroidectomy 2013- sees Dr Kathyanne Parkers yearly as off 05/2020 CV: --CHF/nonischemic cardiomyopathy dx 2011 --Ventricular tachycardia, 2011,  EF  ~15 -20 % -- s/p  biventricular ICD 2011---  replaced 04-2016 --CAD -Nonobstructive   by cath 08-2014 --LBBB  GI: --GERD --H/o anemia:  --Gastric ulcer per EGD 07-2010 DJD (hip pain, XRs dosne 2017) Gout H/o asthma as a child  Large L DVT: 11/11/2023 Mechanical thrombectomy f/u by Angioplasty stenting L common and external iliac veins  PLAN L DVT 11/11/2023: To see vascular surgery soon, anticoagulated without apparent problems. DM: Per Endo.  Currently on Lantus and metformin  Nephropathy:  referred to nephrology due to + micro and a recent DVT. Recheck micro, advised patient to reach out to nephrology and schedule. HTN: Ambulatory BPs in the 120s.  On carvedilol , Lasix , losartan , potassium.  Check BMP High cholesterol: Last LDL 116,On atorvastatin  80 mg, check FLP Diarrhea: For few months, no blood in the stools, getting better.  Last colonoscopy was 05/2022.  Will reassess on RTC Gout: Last uric acid 12/2023 okay, on allopurinol .  SKs: Multiple skin lesions, they look like SKs, declined a referral to Derm, agreed on self  surveillance, red flags discussed.  RTC 6 months

## 2024-05-08 NOTE — Assessment & Plan Note (Signed)
 L DVT 11/11/2023: To see vascular surgery soon, anticoagulated without apparent problems. DM: Per Endo.  Currently on Lantus and metformin  Nephropathy:  referred to nephrology due to + micro and a recent DVT.  Not seen  yet. Recheck micro, advised patient to reach out to nephrology and schedule a visit. HTN: Ambulatory BPs in the 120s.  On carvedilol , Lasix , losartan , potassium.  Check BMP High cholesterol: Last LDL 116,On atorvastatin  80 mg, check FLP Diarrhea: For few months, no blood in the stools, getting better.  Last colonoscopy was 05/2022.  We agreed on reassess on RTC Gout: Last uric acid 12/2023 was okay, on allopurinol . SKs: Multiple skin lesions, they look like SKs, declined a referral to Derm, agreed on self surveillance, red flags discussed.  RTC 6 months

## 2024-05-09 ENCOUNTER — Ambulatory Visit: Payer: Self-pay | Admitting: Internal Medicine

## 2024-05-09 ENCOUNTER — Telehealth: Payer: Self-pay | Admitting: Internal Medicine

## 2024-05-09 ENCOUNTER — Telehealth: Payer: Self-pay

## 2024-05-09 DIAGNOSIS — I10 Essential (primary) hypertension: Secondary | ICD-10-CM

## 2024-05-09 MED ORDER — LOSARTAN POTASSIUM 100 MG PO TABS: 100.0000 mg | ORAL_TABLET | Freq: Every day | ORAL | 0 refills | Status: DC

## 2024-05-09 NOTE — Telephone Encounter (Signed)
 Please call patient. Has protein in the urine.  Recommend the following: Increase Losartan  to 100 mg daily.  Send a new prescription.  Check BPs at home to be sure they are not going below 110. Stop potassium, good hydration BMP in 2 weeks Other labs look good.

## 2024-05-09 NOTE — Progress Notes (Signed)
 EPIC Encounter for ICM Monitoring  Patient Name: Mike Jimenez is a 73 y.o. male Date: 05/09/2024 Primary Care Physican: Ezell Hollow, MD Primary Cardiologist: Carolynne Citron Electrophysiologist: Arvid Latino Pacing:  100% 01/25/2024 Weight: 230-235 lbs 03/06/2024 Office Weight: 234 lbs   Clinical Status  Since 08-May-2024 Time in AT/AF  0.0 hr/day (0.0%)   Battery ERI: 2 months                                           Attempted call to patient and unable to reach.  Left detailed message per DPR regarding transmission.  Transmission results reviewed.    OptiVol Thoracic impedance trending closer to baseline but still suggesting possible fluid accumulation starting 4/22   Prescribed:  Furosemide  20 mg 1 tablet (20 mg total) daily.   Potassium 10 mEq 1 tablet (10 mEq total) daily   Labs: 05/07/2024 Creatinine 1.02, BUN 15, Potassium 4.0, Sodium 141, GFR 73.55 01/06/2024 Creatinine 1.06, BUN 15, Potassium 4.4, Sodium 141, GFR 70.40 11/16/2023 Creatinine 1.00, BUN 12, Potassium 4.4, Sodium 139  11/13/2023 Creatinine 1.28, BUN 19, Potassium 4.2, Sodium 137, GFR 60 11/12/2023 Creatinine 1.13, BUN 18, Potassium 3.8, Sodium 134, GFR >60  A complete set of results can be found in Results Review.   Recommendations:  Left voice mail with ICM number and encouraged to call if experiencing any fluid symptoms.   Follow-up plan: ICM clinic phone appointment on 06/25/2024.   91 day device clinic remote transmission 06/21/2024.     EP/Cardiology Office Visits:  Recall 10/09/2024 with Michaelle Adolphus, PA   Copy of ICM check sent to Dr. Carolynne Citron.  3 month ICM trend: 05/07/2024.    12-14 Month ICM trend:     Almyra Jain, RN 05/09/2024 2:50 PM

## 2024-05-09 NOTE — Telephone Encounter (Signed)
Remote ICM transmission received.  Attempted call to patient regarding ICM remote transmission and left detailed message per DPR.  Left ICM phone number and advised to return call for any fluid symptoms or questions.

## 2024-05-09 NOTE — Telephone Encounter (Signed)
 Spoke w/ Pt- informed of results and recommendations. Rx sent. Pt requested I also send information via mychart. BMP ordered and lab appt scheduled.

## 2024-05-21 ENCOUNTER — Ambulatory Visit

## 2024-05-21 DIAGNOSIS — I5022 Chronic systolic (congestive) heart failure: Secondary | ICD-10-CM

## 2024-05-21 DIAGNOSIS — I428 Other cardiomyopathies: Secondary | ICD-10-CM

## 2024-05-22 ENCOUNTER — Other Ambulatory Visit: Payer: Self-pay | Admitting: Internal Medicine

## 2024-05-22 MED ORDER — LOSARTAN POTASSIUM 100 MG PO TABS: 50.0000 mg | ORAL_TABLET | Freq: Every day | ORAL | Status: DC

## 2024-05-23 ENCOUNTER — Other Ambulatory Visit (INDEPENDENT_AMBULATORY_CARE_PROVIDER_SITE_OTHER)

## 2024-05-23 ENCOUNTER — Ambulatory Visit: Payer: Self-pay | Admitting: Internal Medicine

## 2024-05-23 DIAGNOSIS — I10 Essential (primary) hypertension: Secondary | ICD-10-CM

## 2024-05-23 LAB — CUP PACEART REMOTE DEVICE CHECK
Battery Remaining Longevity: 2 mo
Battery Voltage: 2.77 V
Brady Statistic AP VP Percent: 2.9 %
Brady Statistic AP VS Percent: 0.01 %
Brady Statistic AS VP Percent: 97.08 %
Brady Statistic AS VS Percent: 0.01 %
Brady Statistic RA Percent Paced: 2.91 %
Brady Statistic RV Percent Paced: 99.95 %
Date Time Interrogation Session: 20250624202608
HighPow Impedance: 41 Ohm
HighPow Impedance: 54 Ohm
Implantable Lead Connection Status: 753985
Implantable Lead Connection Status: 753985
Implantable Lead Connection Status: 753985
Implantable Lead Implant Date: 20110516
Implantable Lead Implant Date: 20110516
Implantable Lead Implant Date: 20110516
Implantable Lead Location: 753858
Implantable Lead Location: 753859
Implantable Lead Location: 753860
Implantable Lead Model: 5076
Implantable Lead Model: 6947
Implantable Pulse Generator Implant Date: 20170623
Lead Channel Impedance Value: 285 Ohm
Lead Channel Impedance Value: 323 Ohm
Lead Channel Impedance Value: 380 Ohm
Lead Channel Impedance Value: 399 Ohm
Lead Channel Impedance Value: 551 Ohm
Lead Channel Impedance Value: 836 Ohm
Lead Channel Pacing Threshold Amplitude: 0.875 V
Lead Channel Pacing Threshold Amplitude: 0.875 V
Lead Channel Pacing Threshold Amplitude: 1 V
Lead Channel Pacing Threshold Pulse Width: 0.4 ms
Lead Channel Pacing Threshold Pulse Width: 0.4 ms
Lead Channel Pacing Threshold Pulse Width: 0.6 ms
Lead Channel Sensing Intrinsic Amplitude: 1.5 mV
Lead Channel Sensing Intrinsic Amplitude: 1.5 mV
Lead Channel Sensing Intrinsic Amplitude: 8.375 mV
Lead Channel Sensing Intrinsic Amplitude: 8.375 mV
Lead Channel Setting Pacing Amplitude: 1.5 V
Lead Channel Setting Pacing Amplitude: 2 V
Lead Channel Setting Pacing Amplitude: 2.5 V
Lead Channel Setting Pacing Pulse Width: 0.4 ms
Lead Channel Setting Pacing Pulse Width: 0.6 ms
Lead Channel Setting Sensing Sensitivity: 0.3 mV
Zone Setting Status: 755011

## 2024-05-23 LAB — BASIC METABOLIC PANEL WITH GFR
BUN: 19 mg/dL (ref 6–23)
CO2: 30 meq/L (ref 19–32)
Calcium: 8.8 mg/dL (ref 8.4–10.5)
Chloride: 104 meq/L (ref 96–112)
Creatinine, Ser: 1.11 mg/dL (ref 0.40–1.50)
GFR: 66.43 mL/min (ref >60.00)
Glucose, Bld: 160 mg/dL — ABNORMAL HIGH (ref 70–99)
Potassium: 3.9 meq/L (ref 3.5–5.1)
Sodium: 143 meq/L (ref 135–145)

## 2024-05-25 ENCOUNTER — Other Ambulatory Visit: Payer: Self-pay

## 2024-05-25 DIAGNOSIS — R809 Proteinuria, unspecified: Secondary | ICD-10-CM

## 2024-05-27 ENCOUNTER — Ambulatory Visit: Payer: Self-pay | Admitting: Internal Medicine

## 2024-05-29 NOTE — Progress Notes (Signed)
 Remote ICD transmission.

## 2024-05-30 ENCOUNTER — Encounter: Payer: Self-pay | Admitting: Internal Medicine

## 2024-05-30 ENCOUNTER — Other Ambulatory Visit: Payer: Self-pay | Admitting: Nephrology

## 2024-05-30 DIAGNOSIS — E1122 Type 2 diabetes mellitus with diabetic chronic kidney disease: Secondary | ICD-10-CM | POA: Diagnosis not present

## 2024-05-30 DIAGNOSIS — N182 Chronic kidney disease, stage 2 (mild): Secondary | ICD-10-CM

## 2024-05-30 DIAGNOSIS — R809 Proteinuria, unspecified: Secondary | ICD-10-CM | POA: Diagnosis not present

## 2024-05-30 DIAGNOSIS — I5022 Chronic systolic (congestive) heart failure: Secondary | ICD-10-CM | POA: Diagnosis not present

## 2024-05-30 DIAGNOSIS — E785 Hyperlipidemia, unspecified: Secondary | ICD-10-CM | POA: Diagnosis not present

## 2024-05-30 DIAGNOSIS — I129 Hypertensive chronic kidney disease with stage 1 through stage 4 chronic kidney disease, or unspecified chronic kidney disease: Secondary | ICD-10-CM | POA: Diagnosis not present

## 2024-05-30 DIAGNOSIS — D631 Anemia in chronic kidney disease: Secondary | ICD-10-CM | POA: Diagnosis not present

## 2024-05-30 LAB — COMPREHENSIVE METABOLIC PANEL WITH GFR
Albumin: 4.4 (ref 3.5–5.0)
Calcium: 9 (ref 8.7–10.7)
Globulin: 2.8
eGFR: 88

## 2024-05-30 LAB — BASIC METABOLIC PANEL WITH GFR
BUN: 14 (ref 4–21)
CO2: 26 — AB (ref 13–22)
Chloride: 102 (ref 99–108)
Creatinine: 0.9 (ref 0.6–1.3)
Glucose: 209
Potassium: 4.4 meq/L (ref 3.5–5.1)
Sodium: 141 (ref 137–147)

## 2024-05-30 LAB — CBC AND DIFFERENTIAL
HCT: 38 — AB (ref 41–53)
Hemoglobin: 12.3 — AB (ref 13.5–17.5)
Neutrophils Absolute: 3.5
Platelets: 225 K/uL (ref 150–400)
WBC: 5.2

## 2024-05-30 LAB — PROTEIN / CREATININE RATIO, URINE
Albumin, U: 945.2
Creatinine, Urine: 58.2

## 2024-05-30 LAB — MICROALBUMIN / CREATININE URINE RATIO: Microalb Creat Ratio: 1624

## 2024-05-30 LAB — CBC: RBC: 4.03 (ref 3.87–5.11)

## 2024-05-30 LAB — HM HIV SCREENING LAB: HM HIV Screening: NEGATIVE

## 2024-05-30 LAB — HM HEPATITIS C SCREENING LAB: HM Hepatitis Screen: NEGATIVE

## 2024-06-04 ENCOUNTER — Ambulatory Visit
Admission: RE | Admit: 2024-06-04 | Discharge: 2024-06-04 | Disposition: A | Discharge status: Home / Self Care | Source: Ambulatory Visit | Attending: Nephrology

## 2024-06-04 DIAGNOSIS — N182 Chronic kidney disease, stage 2 (mild): Secondary | ICD-10-CM

## 2024-06-04 NOTE — Progress Notes (Unsigned)
 [facility] [place]  ASSESSMENT / PLAN: 73 y.o. male status post left lower extremity mechanical thrombectomy, thrombolysis, and left common iliac vein stenting 12/13 to a 11/12/2023.  He is doing much better.  He still has some persistent swelling in his leg.  He has been compliant with compression and elevation.  He needs to continue Eliquis  for 6 months at a minimum.  He should continue aspirin  indefinitely.  I will see him back in 6 months to perform a duplex of the stenting.  I counseled him to return to care sooner should any new symptoms of DVT develop.  CHIEF COMPLAINT: Follow-up from thrombectomy  HISTORY OF PRESENT ILLNESS: 11/09/23: Mike Jimenez presented to med [facility] today with a 1 week history of left leg swelling beginning at the hip going all the way down his leg.  He feels pressure when he walks.  He denies any chest pain.  He denies any prolonged inactivity.  He did have a left knee injection on November 19 for knee pain.  He denies any prior history of DVT.  He was admitted last month for dehydartion    The patient has a history of diabetes.  He also suffers from coronary artery disease and congestive heart failure.  He is medically managed for hypertension with an ARB.  He takes a statin for hypercholesterolemia.  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  He is doing very well overall.  He still has some residual swelling in the left leg, which is not very bothersome to him.  He has been compliant with compression and elevation therapies.  He has been compliant with aspirin  and DOAC therapies.  Past Medical History:  Diagnosis Date   Anemia    Arthritis    knees (09/05/2014)   Automatic implantable cardioverter-defibrillator in situ    CAD (coronary artery disease)    a. Mild nonobstructive CAD by cath 08/2014.   CHF  (congestive heart failure) (HCC)    Childhood asthma    Gastric ulcer 2011   GERD (gastroesophageal reflux disease)    History of gout    Hyperlipidemia    Hypertension    LBBB (left bundle branch block)    NICM (nonischemic cardiomyopathy) (HCC)    a. Dx 2011 - presented with sustained VT. Normal coronaries at that time, EF 10-15% by cath and 20-25% by echo. b. s/p biventricular ICD 2011. c. CP 08/2014: mild nonobstructive CAD.   Obesity    Postsurgical hypothyroidism    Thyroid  cancer (HCC)    a. 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 Dr Eletha. s/p thyroidectomy 2013.   Torn meniscus    Type II diabetes mellitus (HCC)    Ventricular tachycardia (HCC)    a. Sustained VT 2011 s/p MDT D274TRK Concert BiV ICD, ser# [id number].    Past Surgical History:  Procedure Laterality Date   BI-VENTRICULAR IMPLANTABLE CARDIOVERTER DEFIBRILLATOR  (CRT-D)  03/2010   BIOPSY  08/06/2021   Procedure: BIOPSY;  Surgeon: Abran Mike SAILOR, MD;  [facility]: [facility];  Service: Endoscopy;;   CARDIAC CATHETERIZATION  03/2010   COLONOSCOPY WITH PROPOFOL  N/A 08/06/2021   Procedure: COLONOSCOPY WITH PROPOFOL ;  Surgeon: Abran Mike SAILOR, MD;  Location: [facility];  Service: Endoscopy;  Laterality: N/A;   EP IMPLANTABLE DEVICE N/A 05/21/2016   Procedure:  ICD Generator Changeout;  Surgeon: Mike LELON Birmingham, MD;  Location: [facility] INVASIVE  CV LAB;  Service: Cardiovascular;  Laterality: N/A;   INSERTION OF ILIAC STENT Left 11/12/2023   Procedure: INSERTION OF LEFT ILIAC VEIN STENT;  Surgeon: Magda Debby SAILOR, MD;  Location: [facility] OR;  Service: Vascular;  Laterality: Left;   LEFT HEART CATHETERIZATION WITH CORONARY ANGIOGRAM N/A 09/06/2014   Procedure: LEFT HEART CATHETERIZATION WITH CORONARY ANGIOGRAM;  Surgeon: Lonni JONETTA Cash, MD;  Location: [facility] CATH LAB;  Service: Cardiovascular;  Laterality: N/A;   LOWER EXTREMITY VENOGRAPHY Left 11/11/2023   Procedure: LOWER EXTREMITY VENOGRAPHY;  Surgeon: Magda Debby SAILOR,  MD;  Location: [facility] INVASIVE CV LAB;  Service: Cardiovascular;  Laterality: Left;   PERIPHERAL VASCULAR THROMBECTOMY Left 11/11/2023   Procedure: PERIPHERAL VASCULAR THROMBECTOMY;  Surgeon: Magda Debby SAILOR, MD;  Location: [facility] INVASIVE CV LAB;  Service: Cardiovascular;  Laterality: Left;   PERIPHERAL VASCULAR ULTRASOUND/IVUS Left 11/11/2023   Procedure: Peripheral Vascular Ultrasound/IVUS;  Surgeon: Magda Debby SAILOR, MD;  Location: [facility] INVASIVE CV LAB;  Service: Cardiovascular;  Laterality: Left;   POLYPECTOMY  08/06/2021   Procedure: POLYPECTOMY;  Surgeon: Abran Mike SAILOR, MD;  [facility]: [facility];  Service: Endoscopy;;   PTX     traumatic, age 47 months   THYROIDECTOMY  12/10/2011   Procedure: TOTAL THYROIDECTOMY WITH CENTRAL COMPARTMENT DISSECTION;  Surgeon: Krystal CHRISTELLA Spinner, MD;  Location: [facility] OR;  Service: General;  Laterality: N/A;  Total thyroidectomy with limited central compartment lymph node dissection.   VASECTOMY      Family History  Problem Relation Age of Onset   Atrial fibrillation Father        diseased at ag 31   Hypertension Father        alive @ 41.   Hypothyroidism Mother    Hypertension Mother        alive @ 10.   Diabetes Maternal Grandfather    Diabetes Maternal Aunt    Colon cancer Maternal Aunt    Colon cancer Maternal Aunt    Lung cancer Paternal Uncle    Lung cancer Maternal Uncle    Asthma Sister    Hypothyroidism Daughter    Prostate cancer Neg Hx    CAD Neg Hx     Social History   Socioeconomic History   Marital status: Married    Spouse name: Not on file   Number of children: 2   Years of education: Not on file   Highest education level: Bachelor's degree (e.g., BA, AB, BS)  Occupational History   Occupation: [employer]   Occupation: to retire 2023  Tobacco Use   Smoking status: Former    Current packs/day: 0.00    Average packs/day: 2.5 packs/day for 2.0 years (5.0 ttl pk-yrs)    Types: Cigarettes    Start date: 11/29/1968    Quit date: 11/29/1970     Years since quitting: 53.5   Smokeless tobacco: Former    Types: Associate Professor status: Never Used  Substance and Sexual Activity   Alcohol use: Yes    Comment: drinks rarely   Drug use: No   Sexual activity: Yes  Other Topics Concern   Not on file  Social History Narrative   Live w/ wife , daughter [name] lives in [place]   11 dogs, fish pond.       Social Drivers of Corporate investment banker Strain: Low Risk  (03/06/2024)   Overall Financial Resource Strain (CARDIA)    Difficulty of Paying Living Expenses: Not hard at all  Food Insecurity:  No Food Insecurity (03/06/2024)   Hunger Vital Sign    Worried About Running Out of Food in the Last Year: Never true    Ran Out of Food in the Last Year: Never true  Transportation Needs: No Transportation Needs (03/06/2024)   PRAPARE - Administrator, Civil Service (Medical): No    Lack of Transportation (Non-Medical): No  Physical Activity: Inactive (03/06/2024)   Exercise Vital Sign    Days of Exercise per Week: 0 days    Minutes of Exercise per Session: 0 min  Stress: No Stress Concern Present (03/06/2024)   [employer] of Occupational Health - Occupational Stress Questionnaire    Feeling of Stress : Not at all  Social Connections: Moderately Isolated (03/06/2024)   Social Connection and Isolation Panel    Frequency of Communication with Friends and Family: More than three times a week    Frequency of Social Gatherings with Friends and Family: More than three times a week    Attends Religious Services: Never    Database administrator or Organizations: No    Attends Banker Meetings: Never    Marital Status: Married  Catering manager Violence: Not At Risk (03/06/2024)   Humiliation, Afraid, Rape, and Kick questionnaire    Fear of Current or Ex-Partner: No    Emotionally Abused: No    Physically Abused: No    Sexually Abused: No    Allergies  Allergen Reactions   Bee Venom Swelling   Amiodarone   Other (See Comments)   Ramipril Cough    Current Outpatient Medications  Medication Sig Dispense Refill   acetaminophen  (TYLENOL ) 500 MG tablet Take 1,000 mg by mouth every 6 (six) hours as needed (pain).     allopurinol  (ZYLOPRIM ) 100 MG tablet Take 1 tablet (100 mg total) by mouth daily. 90 tablet 1   apixaban  (ELIQUIS ) 5 MG TABS tablet Take 1 tablet (5 mg total) by mouth 2 (two) times daily. 180 tablet 1   aspirin  EC 81 MG tablet Take 1 tablet (81 mg total) by mouth daily.     atorvastatin  (LIPITOR) 80 MG tablet Take 80 mg by mouth at bedtime.     carvedilol  (COREG ) 12.5 MG tablet Take 1 tablet (12.5 mg total) by mouth daily. 90 tablet 1   cyanocobalamin (VITAMIN B12) 1000 MCG tablet Take 1,000 mcg by mouth daily.     furosemide  (LASIX ) 20 MG tablet Take 1 tablet (20 mg total) by mouth daily. 90 tablet 1   LANTUS SOLOSTAR 100 UNIT/ML Solostar Pen Inject 10 Units into the skin daily. Titrate up when needed, max daily dose of 50 units     levothyroxine  (SYNTHROID ) 112 MCG tablet Take 1.5 tablets (168 mcg total) by mouth daily before breakfast.     losartan  (COZAAR ) 100 MG tablet Take 0.5 tablets (50 mg total) by mouth daily.     metFORMIN  (GLUCOPHAGE ) 1000 MG tablet Take 1 tablet (1,000 mg total) by mouth 2 (two) times daily with a meal. 180 tablet 1   Multiple Vitamin (MULTIVITAMIN WITH MINERALS) TABS tablet Take 1 tablet by mouth daily.     OVER THE COUNTER MEDICATION Apply 1 application  topically at bedtime as needed (CBD Cream for neuropathy in feet).     tadalafil  (CIALIS ) 20 MG tablet Take 1 tablet (20 mg total) by mouth daily as needed for erectile dysfunction. 10 tablet 5   vitamin E 400 UNIT capsule Take 400 Units by mouth daily.  No current facility-administered medications for this visit.    PHYSICAL EXAM There were no vitals filed for this visit.  Well-appearing elderly man in no distress Regular rate and rhythm Unlabored breathing Left leg 2+ dorsalis pedis  pulse Trace edema about the ankle   PERTINENT LABORATORY AND RADIOLOGIC DATA  Most recent CBC    Latest Ref Rng & Units 01/06/2024   10:26 AM 11/16/2023   12:11 PM 11/13/2023    3:39 AM  CBC  WBC 4.0 - 10.5 K/uL 6.7  4.6  5.7   Hemoglobin 13.0 - 17.0 g/dL 85.7  89.5  89.1   Hematocrit 39.0 - 52.0 % 41.1  30.9  32.3   Platelets 150.0 - 400.0 K/uL 289.0  293.0  173      Most recent CMP    Latest Ref Rng & Units 05/23/2024    9:07 AM 05/07/2024   11:14 AM 01/06/2024   10:26 AM  CMP  Glucose 70 - 99 mg/dL 839  828  660   BUN 6 - 23 mg/dL 19  15  15    Creatinine 0.40 - 1.50 mg/dL 8.88  8.97  8.93   Sodium 135 - 145 mEq/L 143  141  141   Potassium 3.5 - 5.1 mEq/L 3.9  4.0  4.4   Chloride 96 - 112 mEq/L 104  102  99   CO2 19 - 32 mEq/L 30  28  30    Calcium  8.4 - 10.5 mg/dL 8.8  8.9  9.3   AST 0 - 37 U/L   14   ALT 0 - 53 U/L   13     Renal function CrCl cannot be calculated (Unknown ideal weight.).  Hemoglobin A1C (no units)  Date Value  03/22/2024 10.5    LDL Cholesterol  Date Value Ref Range Status  05/07/2024 76 0 - 99 mg/dL Final   Direct LDL  Date Value Ref Range Status  12/31/2021 79.0 mg/dL Final    Comment:    Optimal:  <100 mg/dLNear or Above Optimal:  100-129 mg/dLBorderline High:  130-159 mg/dLHigh:  160-189 mg/dLVery High:  >190 mg/dL     Debby Jimenez. Magda, MD FACS [facility] Phone Number: [phone] 06/04/2024 8:58 AM   Total time spent on preparing this encounter including chart review, data review, collecting history, examining the patient, coordinating care for this established patient, 30 minutes.  Portions of this report may have been transcribed using voice recognition software.  Every effort has been made to ensure accuracy; however, inadvertent computerized transcription errors may still be present.

## 2024-06-05 ENCOUNTER — Ambulatory Visit (HOSPITAL_COMMUNITY)
Admission: RE | Admit: 2024-06-05 | Discharge: 2024-06-05 | Disposition: A | Discharge status: Home / Self Care | Payer: PPO | Source: Ambulatory Visit | Attending: Vascular Surgery | Admitting: Vascular Surgery

## 2024-06-05 ENCOUNTER — Ambulatory Visit: Payer: PPO | Attending: Vascular Surgery | Admitting: Vascular Surgery

## 2024-06-05 ENCOUNTER — Encounter: Payer: Self-pay | Admitting: Vascular Surgery

## 2024-06-05 VITALS — BP 166/94 | HR 66 | Temp 98.0°F | Ht 74.0 in | Wt 238.0 lb

## 2024-06-05 DIAGNOSIS — I824Y2 Acute embolism and thrombosis of unspecified deep veins of left proximal lower extremity: Secondary | ICD-10-CM

## 2024-06-06 ENCOUNTER — Other Ambulatory Visit: Payer: Self-pay | Admitting: Internal Medicine

## 2024-06-07 ENCOUNTER — Encounter: Payer: Self-pay | Admitting: Internal Medicine

## 2024-06-08 NOTE — Telephone Encounter (Signed)
 Med list updated

## 2024-06-08 NOTE — Telephone Encounter (Signed)
 Edit medication list: Delete potassium and Eliquis  Add spironolactone 25 mg (dose unknown, put 1 tablet daily for now)

## 2024-06-08 NOTE — Addendum Note (Signed)
 Addended by: Rogan Wigley D on: 06/08/2024 09:00 AM   Modules accepted: Orders

## 2024-06-10 ENCOUNTER — Other Ambulatory Visit: Payer: Self-pay | Admitting: Internal Medicine

## 2024-06-11 DIAGNOSIS — E890 Postprocedural hypothyroidism: Secondary | ICD-10-CM | POA: Diagnosis not present

## 2024-06-11 DIAGNOSIS — I10 Essential (primary) hypertension: Secondary | ICD-10-CM | POA: Diagnosis not present

## 2024-06-11 DIAGNOSIS — E119 Type 2 diabetes mellitus without complications: Secondary | ICD-10-CM | POA: Diagnosis not present

## 2024-06-11 DIAGNOSIS — Z85850 Personal history of malignant neoplasm of thyroid: Secondary | ICD-10-CM | POA: Diagnosis not present

## 2024-06-14 DIAGNOSIS — N182 Chronic kidney disease, stage 2 (mild): Secondary | ICD-10-CM | POA: Diagnosis not present

## 2024-06-14 LAB — BASIC METABOLIC PANEL WITH GFR
BUN: 16 (ref 4–21)
CO2: 23 — AB (ref 13–22)
Chloride: 102 (ref 99–108)
Creatinine: 1.1 (ref 0.6–1.3)
Glucose: 301
Potassium: 4.1 meq/L (ref 3.5–5.1)
Sodium: 139 (ref 137–147)

## 2024-06-14 LAB — COMPREHENSIVE METABOLIC PANEL WITH GFR
Albumin, Urine POC: 945.2
Calcium: 9.1 (ref 8.7–10.7)
Creatinine, POC: 58.2 mg/dL
HM HIV Screening: NEGATIVE
HM Hepatitis Screen: NEGATIVE
Microalb Creat Ratio: 1624
eGFR: 73

## 2024-06-18 DIAGNOSIS — E119 Type 2 diabetes mellitus without complications: Secondary | ICD-10-CM | POA: Diagnosis not present

## 2024-06-21 ENCOUNTER — Ambulatory Visit (INDEPENDENT_AMBULATORY_CARE_PROVIDER_SITE_OTHER)

## 2024-06-21 DIAGNOSIS — I5022 Chronic systolic (congestive) heart failure: Secondary | ICD-10-CM | POA: Diagnosis not present

## 2024-06-22 LAB — CUP PACEART REMOTE DEVICE CHECK
Battery Remaining Longevity: 2 mo
Battery Voltage: 2.76 V
Brady Statistic AP VP Percent: 2.57 %
Brady Statistic AP VS Percent: 0.01 %
Brady Statistic AS VP Percent: 97.39 %
Brady Statistic AS VS Percent: 0.03 %
Brady Statistic RA Percent Paced: 2.58 %
Brady Statistic RV Percent Paced: 99.9 %
Date Time Interrogation Session: 20250724063326
HighPow Impedance: 41 Ohm
HighPow Impedance: 51 Ohm
Implantable Lead Connection Status: 753985
Implantable Lead Connection Status: 753985
Implantable Lead Connection Status: 753985
Implantable Lead Implant Date: 20110516
Implantable Lead Implant Date: 20110516
Implantable Lead Implant Date: 20110516
Implantable Lead Location: 753858
Implantable Lead Location: 753859
Implantable Lead Location: 753860
Implantable Lead Model: 5076
Implantable Lead Model: 6947
Implantable Pulse Generator Implant Date: 20170623
Lead Channel Impedance Value: 266 Ohm
Lead Channel Impedance Value: 323 Ohm
Lead Channel Impedance Value: 399 Ohm
Lead Channel Impedance Value: 437 Ohm
Lead Channel Impedance Value: 570 Ohm
Lead Channel Impedance Value: 874 Ohm
Lead Channel Pacing Threshold Amplitude: 0.625 V
Lead Channel Pacing Threshold Amplitude: 0.75 V
Lead Channel Pacing Threshold Amplitude: 1 V
Lead Channel Pacing Threshold Pulse Width: 0.4 ms
Lead Channel Pacing Threshold Pulse Width: 0.4 ms
Lead Channel Pacing Threshold Pulse Width: 0.6 ms
Lead Channel Sensing Intrinsic Amplitude: 1.5 mV
Lead Channel Sensing Intrinsic Amplitude: 1.5 mV
Lead Channel Sensing Intrinsic Amplitude: 8.375 mV
Lead Channel Sensing Intrinsic Amplitude: 8.375 mV
Lead Channel Setting Pacing Amplitude: 1.5 V
Lead Channel Setting Pacing Amplitude: 1.75 V
Lead Channel Setting Pacing Amplitude: 2.5 V
Lead Channel Setting Pacing Pulse Width: 0.4 ms
Lead Channel Setting Pacing Pulse Width: 0.6 ms
Lead Channel Setting Sensing Sensitivity: 0.3 mV
Zone Setting Status: 755011

## 2024-06-22 NOTE — Addendum Note (Signed)
 Addended by: TAWNI DRILLING D on: 06/22/2024 05:23 PM   Modules accepted: Orders, Level of Service

## 2024-06-22 NOTE — Progress Notes (Signed)
 Remote ICD transmission.

## 2024-06-25 ENCOUNTER — Ambulatory Visit: Payer: Self-pay | Admitting: Internal Medicine

## 2024-06-25 ENCOUNTER — Ambulatory Visit: Attending: Internal Medicine

## 2024-06-25 DIAGNOSIS — I5022 Chronic systolic (congestive) heart failure: Secondary | ICD-10-CM

## 2024-06-25 DIAGNOSIS — Z95810 Presence of automatic (implantable) cardiac defibrillator: Secondary | ICD-10-CM | POA: Diagnosis not present

## 2024-06-29 ENCOUNTER — Telehealth: Payer: Self-pay

## 2024-06-29 NOTE — Telephone Encounter (Signed)
 Remote ICM transmission received.  Attempted call to patient regarding ICM remote transmission and left detailed message per DPR.  Left ICM phone number and advised to return call for any fluid symptoms or questions. Next ICM remote transmission scheduled 07/31/2024.

## 2024-06-29 NOTE — Progress Notes (Signed)
 EPIC Encounter for ICM Monitoring  Patient Name: Mike Jimenez is a 73 y.o. male Date: 06/29/2024 Primary Care Physican: Amon Aloysius BRAVO, MD Primary Cardiologist: [name] Electrophysiologist: [name] Pore Pacing:  100% 01/25/2024 Weight: 230-235 lbs 03/06/2024 Office Weight: 234 lbs   Clinical Status  Since 08-May-2024 Time in AT/AF  0.0 hr/day (0.0%)   Battery ERI: 2 months                                           Attempted call to patient and unable to reach.  Left detailed message per DPR regarding transmission.  Transmission results reviewed.    OptiVol Thoracic impedance suggesting normal fluid levels since 7/11.   Prescribed:  Furosemide  20 mg 1 tablet (20 mg total) daily.   Potassium 10 mEq 1 tablet (10 mEq total) daily   Labs: 05/23/2024 Creatinine 1.11, BUN 19, Potassium 3.9, Sodium 143, GFR 66.43 05/07/2024 Creatinine 1.02, BUN 15, Potassium 4.0, Sodium 141, GFR 73.55 01/06/2024 Creatinine 1.06, BUN 15, Potassium 4.4, Sodium 141, GFR 70.40 A complete set of results can be found in Results Review.   Recommendations:  Left voice mail with ICM number and encouraged to call if experiencing any fluid symptoms.   Follow-up plan: ICM clinic phone appointment on 07/31/2024.   91 day device clinic remote transmission 07/23/2024.     EP/Cardiology Office Visits:  Recall 10/09/2024 with Jodie Passey, PA   Copy of ICM check sent to Dr. [name].  3 month ICM trend: 06/25/2024.    12-14 Month ICM trend:     [name], RN 06/29/2024 9:04 AM

## 2024-07-23 ENCOUNTER — Ambulatory Visit (INDEPENDENT_AMBULATORY_CARE_PROVIDER_SITE_OTHER)

## 2024-07-23 DIAGNOSIS — I5022 Chronic systolic (congestive) heart failure: Secondary | ICD-10-CM

## 2024-07-26 ENCOUNTER — Ambulatory Visit: Payer: Self-pay | Admitting: Internal Medicine

## 2024-07-26 LAB — CUP PACEART REMOTE DEVICE CHECK
Battery Remaining Longevity: 1 mo
Battery Voltage: 2.73 V
Brady Statistic AP VP Percent: 7.24 %
Brady Statistic AP VS Percent: 0.01 %
Brady Statistic AS VP Percent: 92.72 %
Brady Statistic AS VS Percent: 0.03 %
Brady Statistic RA Percent Paced: 7.22 %
Brady Statistic RV Percent Paced: 99.63 %
Date Time Interrogation Session: 20250827162303
HighPow Impedance: 45 Ohm
HighPow Impedance: 59 Ohm
Implantable Lead Connection Status: 753985
Implantable Lead Connection Status: 753985
Implantable Lead Connection Status: 753985
Implantable Lead Implant Date: 20110516
Implantable Lead Implant Date: 20110516
Implantable Lead Implant Date: 20110516
Implantable Lead Location: 753858
Implantable Lead Location: 753859
Implantable Lead Location: 753860
Implantable Lead Model: 5076
Implantable Lead Model: 6947
Implantable Pulse Generator Implant Date: 20170623
Lead Channel Impedance Value: 266 Ohm
Lead Channel Impedance Value: 323 Ohm
Lead Channel Impedance Value: 380 Ohm
Lead Channel Impedance Value: 456 Ohm
Lead Channel Impedance Value: 570 Ohm
Lead Channel Impedance Value: 893 Ohm
Lead Channel Pacing Threshold Amplitude: 0.625 V
Lead Channel Pacing Threshold Amplitude: 0.75 V
Lead Channel Pacing Threshold Amplitude: 1.125 V
Lead Channel Pacing Threshold Pulse Width: 0.4 ms
Lead Channel Pacing Threshold Pulse Width: 0.4 ms
Lead Channel Pacing Threshold Pulse Width: 0.6 ms
Lead Channel Sensing Intrinsic Amplitude: 2 mV
Lead Channel Sensing Intrinsic Amplitude: 2 mV
Lead Channel Sensing Intrinsic Amplitude: 8.375 mV
Lead Channel Sensing Intrinsic Amplitude: 8.375 mV
Lead Channel Setting Pacing Amplitude: 1.75 V
Lead Channel Setting Pacing Amplitude: 1.75 V
Lead Channel Setting Pacing Amplitude: 2.5 V
Lead Channel Setting Pacing Pulse Width: 0.4 ms
Lead Channel Setting Pacing Pulse Width: 0.6 ms
Lead Channel Setting Sensing Sensitivity: 0.3 mV
Zone Setting Status: 755011

## 2024-07-26 NOTE — Progress Notes (Signed)
 Remote PPM Transmission

## 2024-07-31 ENCOUNTER — Encounter

## 2024-07-31 LAB — HEMOGLOBIN A1C: Hemoglobin A1C: 8.4

## 2024-08-02 NOTE — Progress Notes (Signed)
 No ICM remote transmission received for 07/31/2024 and next ICM transmission scheduled for 08/13/2024.

## 2024-08-08 ENCOUNTER — Telehealth: Payer: Self-pay

## 2024-08-08 NOTE — Telephone Encounter (Signed)
 LM for pt to call back to schedule Gen Change with Dr. [name].

## 2024-08-08 NOTE — Telephone Encounter (Signed)
 Pt has been scheduled for BiV-ICD Gen Change with Dr. [name] on 10/1 at 12:30 pm.  He is scheduled to see [facility] tomorrow and aware that we will go over all instructions at that time.   He will get labs, scrub and Instruction letter at o/v on 10/1.

## 2024-08-08 NOTE — Telephone Encounter (Signed)
 Alert remote transmission: RRT reached 07/28/24   ___________________________________________________________________  Mike Jimenez w/ patient regarding device reaching RRT. Educated patient 3 months remain on battery from the date RRT was reached. Verbalized understanding.   Patient scheduled for F/U appointment on 08/09/2024 @ 0850 w/ Daphne Barrack, NP to discuss Gen Change.   Informed to call device clinic for any questions or concerns.

## 2024-08-09 ENCOUNTER — Encounter (HOSPITAL_COMMUNITY): Payer: Self-pay

## 2024-08-09 ENCOUNTER — Ambulatory Visit: Attending: Pulmonary Disease | Admitting: Pulmonary Disease

## 2024-08-09 ENCOUNTER — Encounter: Payer: Self-pay | Admitting: Pulmonary Disease

## 2024-08-09 VITALS — BP 131/74 | HR 74 | Ht 74.0 in | Wt 235.0 lb

## 2024-08-09 DIAGNOSIS — I5022 Chronic systolic (congestive) heart failure: Secondary | ICD-10-CM | POA: Diagnosis not present

## 2024-08-09 LAB — CUP PACEART INCLINIC DEVICE CHECK
Date Time Interrogation Session: 20250911124550
Implantable Lead Connection Status: 753985
Implantable Lead Connection Status: 753985
Implantable Lead Connection Status: 753985
Implantable Lead Implant Date: 20110516
Implantable Lead Implant Date: 20110516
Implantable Lead Implant Date: 20110516
Implantable Lead Location: 753858
Implantable Lead Location: 753859
Implantable Lead Location: 753860
Implantable Lead Model: 5076
Implantable Lead Model: 6947
Implantable Pulse Generator Implant Date: 20170623

## 2024-08-09 NOTE — Progress Notes (Signed)
  Electrophysiology Office Note:   Date:  08/09/2024  ID:  Mike Jimenez, DOB 07-25-1951, MRN [id number]  Primary Cardiologist: None Primary Heart Failure: None Electrophysiologist: Danelle Birmingham, MD       History of Present Illness:   Mike Jimenez is a 73 y.o. male with h/o HFrEF s/p CRT-D, MMVT, thyroid  CA,  seen today for routine electrophysiology followup.   Device alert received that patient had met ERI as of 07/28/24.  Since last being seen in our clinic the patient reports doing well. He stays busy with his Koi ponds.  He is pending a trip to see his daughter after his generator change.  No device related concerns. He does ask about battery life after the change is complete.     He denies chest pain, palpitations, dyspnea, PND, orthopnea, nausea, vomiting, dizziness, syncope, edema, weight gain, or early satiety.   Review of systems complete and found to be negative unless listed in HPI.   EP Information / Studies Reviewed:    EKG is not ordered today. EKG from 11/09/23 reviewed which showed VP rhythm      ICD Interrogation-  reviewed in detail today,  See PACEART report.  Device History: Medtronic BiV ICD implanted 04/13/2010 for HFrEF Generator Change >  Mixed Lead System History of appropriate therapy: No History of AAD therapy: No   Risk Assessment/Calculations:              Physical Exam:   VS:  BP 131/74   Pulse 74   Ht 6' 2 (1.88 m)   Wt 235 lb (106.6 kg)   SpO2 95%   BMI 30.17 kg/m    Wt Readings from Last 3 Encounters:  08/09/24 235 lb (106.6 kg)  06/05/24 238 lb (108 kg)  05/07/24 236 lb 8 oz (107.3 kg)     GEN: pleasant, well nourished, well developed in no acute distress NECK: No JVD; No carotid bruits CARDIAC: Regular rate and rhythm, no murmurs, rubs, gallops RESPIRATORY:  Clear to auscultation without rales, wheezing or rhonchi  ABDOMEN: Soft, non-tender, non-distended EXTREMITIES:  No edema; No deformity   ASSESSMENT AND PLAN:    Chronic  Systolic Dysfunction s/p Medtronic CRT-D  MMVT  -euvolemic on exam / by device  -dependent at 30 bpm on 08/09/24 check  -Stable on an appropriate medical regimen -Normal ICD function -See Pace Art report -No programming changes today -procedure discussed with patient, prep for procedure / scrub, written instructions given  -pending pre-procedure labs > CBC, BMP   SCAF / AFL -pt noted on device check to have very brief episodes of AF/AFL > longest episode 30 minutes, controlled ventricular rates  -AF alert for 6 hours turned on for monitoring as patient is not on OAC  -discussed implications of prolonged episodes with patient and possible need for [facility] / rhythm control    Disposition:   Follow up with Dr. Birmingham as planned for generator change    Signed, Daphne Barrack, NP-C, AGACNP-BC [facility] - Electrophysiology  08/09/2024, 9:42 AM

## 2024-08-09 NOTE — Patient Instructions (Signed)
 Thank you for choosing [facility]!     Medication Instructions:  No medication changes were made during today's visit.  *If you need a refill on your cardiac medications before your next appointment, please call your pharmacy*   Lab Work: No labs were ordered during today's visit.  If you have labs (blood work) drawn today and your tests are completely normal, you will receive your results only by: MyChart Message (if you have MyChart) OR A paper copy in the mail If you have any lab test that is abnormal or we need to change your treatment, we will call you to review the results.   Testing/Procedures: No procedures were ordered during today's visit.    Follow-Up: At [facility], you and your health needs are our priority.  As part of our continuing mission to provide you with exceptional heart care, we have created designated Provider Care Teams.  These Care Teams include your primary Cardiologist (physician) and Advanced Practice Providers (APPs -  Physician Assistants and Nurse Practitioners) who all work together to provide you with the care you need, when you need it. We recommend signing up for the patient portal called MyChart.  Sign up information is provided on this After Visit Summary.  MyChart is used to connect with patients for Virtual Visits (Telemedicine).  Patients are able to view lab/test results, encounter notes, upcoming appointments, etc.  Non-urgent messages can be sent to your provider as well.   To learn more about what you can do with MyChart, go to [email or web address].

## 2024-08-10 ENCOUNTER — Other Ambulatory Visit: Payer: Self-pay

## 2024-08-10 MED ORDER — COVID-19 MRNA VAC-TRIS(PFIZER) 30 MCG/0.3ML IM SUSY: 0.3000 mL | PREFILLED_SYRINGE | Freq: Once | INTRAMUSCULAR | 0 refills | Status: AC

## 2024-08-13 ENCOUNTER — Ambulatory Visit: Attending: Internal Medicine

## 2024-08-13 DIAGNOSIS — Z95810 Presence of automatic (implantable) cardiac defibrillator: Secondary | ICD-10-CM | POA: Diagnosis not present

## 2024-08-13 DIAGNOSIS — I5022 Chronic systolic (congestive) heart failure: Secondary | ICD-10-CM

## 2024-08-15 NOTE — Progress Notes (Signed)
 EPIC Encounter for ICM Monitoring  Patient Name: Mike Jimenez is a 73 y.o. male Date: 08/15/2024 Primary Care Physican: Amon Aloysius BRAVO, MD Primary Cardiologist: [name] Electrophysiologist: [name] Pore Pacing:  98.9% 01/25/2024 Weight: 230-235 lbs 03/06/2024 Office Weight: 234 lbs 08/09/2024 Office Weight: 238 lbs   Clinical Status Since 09-Aug-2024 Time in AT/AF  0.0 hr/day (0.0%)   Battery ERI: Device battery replacement scheduled for 10/1                                          Spoke with patient and heart failure questions reviewed.  Transmission results reviewed.  Pt does not have any energy and hopes will improve after battery change.       OptiVol Thoracic impedance suggesting normal fluid levels within the last month.   Prescribed:  Furosemide  20 mg 1 tablet (20 mg total) daily.   Potassium 10 mEq 1 tablet (10 mEq total) daily   Labs: 05/23/2024 Creatinine 1.11, BUN 19, Potassium 3.9, Sodium 143, GFR 66.43 05/07/2024 Creatinine 1.02, BUN 15, Potassium 4.0, Sodium 141, GFR 73.55 01/06/2024 Creatinine 1.06, BUN 15, Potassium 4.4, Sodium 141, GFR 70.40 A complete set of results can be found in Results Review.   Recommendations:  No changes and encouraged to call if experiencing any fluid symptoms.   Follow-up plan: ICM clinic phone appointment on 10/08/2024 to allow thoracic impedance to develop after 08/29/2024 battery replacement.   91 day device clinic remote transmission pending battery replacement.     EP/Cardiology Office Visits:  Recall 10/09/2024 with Jodie Passey, PA   Copy of ICM check sent to Dr. [name].  3 month ICM trend: 08/13/2024.    12-14 Month ICM trend:     [name], RN 08/15/2024 1:14 PM

## 2024-08-20 DIAGNOSIS — I5022 Chronic systolic (congestive) heart failure: Secondary | ICD-10-CM | POA: Diagnosis not present

## 2024-08-21 ENCOUNTER — Ambulatory Visit: Payer: Self-pay | Admitting: Pulmonary Disease

## 2024-08-21 LAB — CBC
Hematocrit: 39.6 % (ref 37.5–51.0)
Hemoglobin: 13.1 g/dL (ref 13.0–17.7)
MCH: 30.8 pg (ref 26.6–33.0)
MCHC: 33.1 g/dL (ref 31.5–35.7)
MCV: 93 fL (ref 79–97)
Platelets: 286 x10E3/uL (ref 150–450)
RBC: 4.25 x10E6/uL (ref 4.14–5.80)
RDW: 12.4 % (ref 11.6–15.4)
WBC: 6.1 x10E3/uL (ref 3.4–10.8)

## 2024-08-21 LAB — BASIC METABOLIC PANEL WITH GFR
BUN/Creatinine Ratio: 17 (ref 10–24)
BUN: 20 mg/dL (ref 8–27)
CO2: 21 mmol/L (ref 20–29)
Calcium: 9.9 mg/dL (ref 8.6–10.2)
Chloride: 104 mmol/L (ref 96–106)
Creatinine, Ser: 1.19 mg/dL (ref 0.76–1.27)
Glucose: 194 mg/dL — AB (ref 70–99)
Potassium: 4.7 mmol/L (ref 3.5–5.2)
Sodium: 143 mmol/L (ref 134–144)
eGFR: 65 mL/min/1.73 (ref >59)

## 2024-08-23 ENCOUNTER — Encounter

## 2024-08-23 LAB — CUP PACEART REMOTE DEVICE CHECK
Battery Remaining Longevity: 1 mo — CL
Battery Voltage: 2.64 V
Brady Statistic AP VP Percent: 3.88 %
Brady Statistic AP VS Percent: 0.01 %
Brady Statistic AS VP Percent: 95.9 %
Brady Statistic AS VS Percent: 0.22 %
Brady Statistic RA Percent Paced: 3.86 %
Brady Statistic RV Percent Paced: 98.78 %
Date Time Interrogation Session: 20250925063426
HighPow Impedance: 42 Ohm
HighPow Impedance: 50 Ohm
Implantable Lead Connection Status: 753985
Implantable Lead Connection Status: 753985
Implantable Lead Connection Status: 753985
Implantable Lead Implant Date: 20110516
Implantable Lead Implant Date: 20110516
Implantable Lead Implant Date: 20110516
Implantable Lead Location: 753858
Implantable Lead Location: 753859
Implantable Lead Location: 753860
Implantable Lead Model: 5076
Implantable Lead Model: 6947
Implantable Pulse Generator Implant Date: 20170623
Lead Channel Impedance Value: 228 Ohm
Lead Channel Impedance Value: 285 Ohm
Lead Channel Impedance Value: 380 Ohm
Lead Channel Impedance Value: 399 Ohm
Lead Channel Impedance Value: 551 Ohm
Lead Channel Impedance Value: 836 Ohm
Lead Channel Pacing Threshold Amplitude: 0.625 V
Lead Channel Pacing Threshold Amplitude: 0.75 V
Lead Channel Pacing Threshold Amplitude: 1 V
Lead Channel Pacing Threshold Pulse Width: 0.4 ms
Lead Channel Pacing Threshold Pulse Width: 0.4 ms
Lead Channel Pacing Threshold Pulse Width: 0.6 ms
Lead Channel Sensing Intrinsic Amplitude: 1.75 mV
Lead Channel Sensing Intrinsic Amplitude: 1.75 mV
Lead Channel Sensing Intrinsic Amplitude: 8.375 mV
Lead Channel Sensing Intrinsic Amplitude: 8.375 mV
Lead Channel Setting Pacing Amplitude: 1.5 V
Lead Channel Setting Pacing Amplitude: 1.75 V
Lead Channel Setting Pacing Amplitude: 2.5 V
Lead Channel Setting Pacing Pulse Width: 0.4 ms
Lead Channel Setting Pacing Pulse Width: 0.6 ms
Lead Channel Setting Sensing Sensitivity: 0.3 mV
Zone Setting Status: 755011

## 2024-08-26 ENCOUNTER — Ambulatory Visit: Payer: Self-pay | Admitting: Internal Medicine

## 2024-08-29 ENCOUNTER — Ambulatory Visit (HOSPITAL_COMMUNITY)
Admission: RE | Admit: 2024-08-29 | Discharge: 2024-08-29 | Disposition: A | Discharge status: Home / Self Care | Attending: Internal Medicine | Admitting: Internal Medicine

## 2024-08-29 ENCOUNTER — Ambulatory Visit (HOSPITAL_COMMUNITY)
Admission: RE | Disposition: A | Discharge status: Home / Self Care | Payer: Self-pay | Source: Home / Self Care | Attending: Internal Medicine

## 2024-08-29 ENCOUNTER — Other Ambulatory Visit: Payer: Self-pay

## 2024-08-29 DIAGNOSIS — Z4502 Encounter for adjustment and management of automatic implantable cardiac defibrillator: Secondary | ICD-10-CM | POA: Diagnosis not present

## 2024-08-29 DIAGNOSIS — I5022 Chronic systolic (congestive) heart failure: Secondary | ICD-10-CM | POA: Diagnosis not present

## 2024-08-29 DIAGNOSIS — I472 Ventricular tachycardia, unspecified: Secondary | ICD-10-CM | POA: Diagnosis not present

## 2024-08-29 DIAGNOSIS — I442 Atrioventricular block, complete: Secondary | ICD-10-CM | POA: Diagnosis not present

## 2024-08-29 HISTORY — PX: BIV ICD GENERATOR CHANGEOUT: EP1194

## 2024-08-29 LAB — GLUCOSE, CAPILLARY: Glucose-Capillary: 159 mg/dL — ABNORMAL HIGH (ref 70–99)

## 2024-08-29 SURGERY — BIV ICD GENERATOR CHANGEOUT

## 2024-08-29 MED ORDER — SODIUM CHLORIDE 0.9 % IV SOLN
INTRAVENOUS | Status: AC
  Filled 2024-08-29: qty 2

## 2024-08-29 MED ORDER — ACETAMINOPHEN 325 MG PO TABS: 325.0000 mg | ORAL_TABLET | ORAL | Status: DC | PRN

## 2024-08-29 MED ORDER — CEFAZOLIN SODIUM-DEXTROSE 2-4 GM/100ML-% IV SOLN
2.0000 g | INTRAVENOUS | Status: AC
  Administered 2024-08-29: 2 g via INTRAVENOUS

## 2024-08-29 MED ORDER — ONDANSETRON HCL 4 MG/2ML IJ SOLN: 4.0000 mg | Freq: Four times a day (QID) | INTRAMUSCULAR | Status: DC | PRN

## 2024-08-29 MED ORDER — LIDOCAINE HCL (PF) 1 % IJ SOLN
INTRAMUSCULAR | Status: DC | PRN
  Administered 2024-08-29: 50 mL via INTRADERMAL

## 2024-08-29 MED ORDER — CHLORHEXIDINE GLUCONATE 4 % EX SOLN: 4.0000 | Freq: Once | CUTANEOUS | Status: DC

## 2024-08-29 MED ORDER — SODIUM CHLORIDE 0.9 % IV SOLN
80.0000 mg | INTRAVENOUS | Status: AC
  Administered 2024-08-29: 80 mg

## 2024-08-29 MED ORDER — LIDOCAINE HCL (PF) 1 % IJ SOLN
INTRAMUSCULAR | Status: AC
  Filled 2024-08-29: qty 30

## 2024-08-29 MED ORDER — MIDAZOLAM HCL 5 MG/5ML IJ SOLN
INTRAMUSCULAR | Status: DC | PRN
  Administered 2024-08-29 (×2): 1 mg via INTRAVENOUS

## 2024-08-29 MED ORDER — SODIUM CHLORIDE 0.9 % IV SOLN: INTRAVENOUS | Status: DC

## 2024-08-29 MED ORDER — MIDAZOLAM HCL 2 MG/2ML IJ SOLN
INTRAMUSCULAR | Status: AC
  Filled 2024-08-29: qty 2

## 2024-08-29 MED ORDER — POVIDONE-IODINE 10 % EX SWAB
2.0000 | Freq: Once | CUTANEOUS | Status: AC
  Administered 2024-08-29: 2 via TOPICAL

## 2024-08-29 MED ORDER — FENTANYL CITRATE (PF) 100 MCG/2ML IJ SOLN
INTRAMUSCULAR | Status: AC
  Filled 2024-08-29: qty 2

## 2024-08-29 MED ORDER — CEFAZOLIN SODIUM-DEXTROSE 2-4 GM/100ML-% IV SOLN
INTRAVENOUS | Status: AC
  Filled 2024-08-29: qty 100

## 2024-08-29 MED ORDER — FENTANYL CITRATE (PF) 100 MCG/2ML IJ SOLN
INTRAMUSCULAR | Status: DC | PRN
  Administered 2024-08-29 (×2): 12.5 ug via INTRAVENOUS

## 2024-08-29 SURGICAL SUPPLY — 5 items
CABLE SURGICAL S-101-97-12 (CABLE) ×1 IMPLANT
ICD COBALT XT CRT DTPA2D1 (ICD Generator) IMPLANT
PAD DEFIB RADIO PHYSIO CONN (PAD) ×1 IMPLANT
POUCH AIGIS-R ANTIBACT ICD LRG (Mesh General) IMPLANT
TRAY PACEMAKER INSERTION (PACKS) ×1 IMPLANT

## 2024-08-29 NOTE — Progress Notes (Signed)
 Discharge instructions reviewed with patient and Wife at bedside. Verbalized understanding denies questions or concerns. PT ambulated in the hallway was able to void in the bathroom without difficulty. No s/s of complications at incision site. Tolerated PO intake. IV removed. PT escorted from the unit via wheel chair to personal vehicle.

## 2024-08-29 NOTE — Discharge Instructions (Signed)

## 2024-08-29 NOTE — H&P (Signed)
  Electrophysiology Office Note:   Date:  08/09/2024  ID:  Mike Jimenez, DOB 1951-02-14, MRN [id number]   Primary Cardiologist: None Primary Heart Failure: None Electrophysiologist: Danelle Birmingham, MD        History of Present Illness:   Mike Jimenez is a 73 y.o. male with h/o HFrEF s/p CRT-D, MMVT, thyroid  CA,  seen today for routine electrophysiology followup.    Device alert received that patient had met ERI as of 07/28/24.   Since last being seen in our clinic the patient reports doing well. He stays busy with his Koi ponds.  He is pending a trip to see his daughter after his generator change.  No device related concerns. He does ask about battery life after the change is complete.      He denies chest pain, palpitations, dyspnea, PND, orthopnea, nausea, vomiting, dizziness, syncope, edema, weight gain, or early satiety.    Review of systems complete and found to be negative unless listed in HPI.     EP Information / Studies Reviewed:     EKG is not ordered today. EKG from 11/09/23 reviewed which showed VP rhythm        ICD Interrogation-  reviewed in detail today,  See PACEART report.   Device History: Medtronic BiV ICD implanted 04/13/2010 for HFrEF Generator Change >  Mixed Lead System History of appropriate therapy: No History of AAD therapy: No    Risk Assessment/Calculations:               Physical Exam:   VS:  BP 131/74   Pulse 74   Ht 6' 2 (1.88 m)   Wt 235 lb (106.6 kg)   SpO2 95%   BMI 30.17 kg/m       Wt Readings from Last 3 Encounters:  08/09/24 235 lb (106.6 kg)  06/05/24 238 lb (108 kg)  05/07/24 236 lb 8 oz (107.3 kg)      GEN: pleasant, well nourished, well developed in no acute distress NECK: No JVD; No carotid bruits CARDIAC: Regular rate and rhythm, no murmurs, rubs, gallops RESPIRATORY:  Clear to auscultation without rales, wheezing or rhonchi  ABDOMEN: Soft, non-tender, non-distended EXTREMITIES:  No edema; No deformity    ASSESSMENT  AND PLAN:     Chronic Systolic Dysfunction s/p Medtronic CRT-D  MMVT  -euvolemic on exam / by device  -dependent at 30 bpm on 08/09/24 check  -Stable on an appropriate medical regimen -Normal ICD function -See Pace Art report -No programming changes today -procedure discussed with patient, prep for procedure / scrub, written instructions given  -pending pre-procedure labs > CBC, BMP    SCAF / AFL -pt noted on device check to have very brief episodes of AF/AFL > longest episode 30 minutes, controlled ventricular rates  -AF alert for 6 hours turned on for monitoring as patient is not on OAC  -discussed implications of prolonged episodes with patient and possible need for [facility] / rhythm control      Disposition:   Follow up with Dr. Birmingham as planned for generator change      Signed, Daphne Barrack, NP-C, AGACNP-BC [facility] - Electrophysiology  08/09/2024, 9:42 AM      EP Attending  Patient seen and examined. Agree with above. The patient has a h/o VT and is s/p ICD insertion who has reached ERI. I have reviewed the indications/risks/benefits/goals/expectations of ICD insertion and he wishes to proceed.  Danelle Bennet Kujawa,MD

## 2024-08-30 ENCOUNTER — Encounter: Payer: Self-pay | Admitting: Internal Medicine

## 2024-08-30 MED FILL — Midazolam HCl Inj 2 MG/2ML (Base Equivalent): INTRAMUSCULAR | Qty: 2 | Status: AC

## 2024-08-30 NOTE — Progress Notes (Signed)
 Remote ICD Transmission

## 2024-08-31 ENCOUNTER — Encounter (HOSPITAL_COMMUNITY): Payer: Self-pay | Admitting: Internal Medicine

## 2024-09-02 ENCOUNTER — Other Ambulatory Visit: Payer: Self-pay | Admitting: Internal Medicine

## 2024-09-03 ENCOUNTER — Encounter

## 2024-09-11 ENCOUNTER — Ambulatory Visit: Attending: Cardiology

## 2024-09-11 DIAGNOSIS — I5022 Chronic systolic (congestive) heart failure: Secondary | ICD-10-CM | POA: Diagnosis not present

## 2024-09-11 LAB — CUP PACEART INCLINIC DEVICE CHECK
Date Time Interrogation Session: 20251014113922
Implantable Pulse Generator Implant Date: 20251001

## 2024-09-11 NOTE — Patient Instructions (Signed)
   After Your ICD (Implantable Cardiac Defibrillator)    Monitor your defibrillator site for redness, swelling, and drainage. Call the device clinic at [phone] if you experience these symptoms or fever/chills.  Your incision was closed with Steri-strips or staples:  You may shower 7 days after your procedure and wash your incision with soap and water. Avoid lotions, ointments, or perfumes over your incision until it is well-healed.  You may use a hot tub or a pool after your wound check appointment if the incision is completely closed.   Your ICD is designed to protect you from life threatening heart rhythms. Because of this, you may receive a shock.   1 shock with no symptoms:  Call the office during business hours. 1 shock with symptoms (chest pain, chest pressure, dizziness, lightheadedness, shortness of breath, overall feeling unwell):  Call 911. If you experience 2 or more shocks in 24 hours:  Call 911. If you receive a shock, you should not drive.  [place] DMV - no driving for 6 months if you receive appropriate therapy from your ICD.   ICD Alerts:  Some alerts are vibratory and others beep. These are NOT emergencies. Please call our office to let us know. If this occurs at night or on weekends, it can wait until the next business day. Send a remote transmission.  If your device is capable of reading fluid status (for heart failure), you will be offered monthly monitoring to review this with you.   Remote monitoring is used to monitor your ICD from home. This monitoring is scheduled every 91 days by our office. It allows us to keep an eye on the functioning of your device to ensure it is working properly. You will routinely see your Electrophysiologist annually (more often if necessary).  

## 2024-09-11 NOTE — Progress Notes (Signed)
 Normal CRT-D chamber ICD wound check. Wound well healed. Presenting rhythm: AS/BVP. Routine testing performed. Thresholds, sensing, and impedance consistent with implant measurements. BVP 99.8%. No treated arrhythmias.  Reviewed shock plan.  Pt enrolled in remote follow-up.

## 2024-09-18 ENCOUNTER — Encounter: Payer: Self-pay | Admitting: Internal Medicine

## 2024-09-22 ENCOUNTER — Encounter: Payer: Self-pay | Admitting: Internal Medicine

## 2024-09-24 ENCOUNTER — Ambulatory Visit: Payer: Self-pay

## 2024-09-24 ENCOUNTER — Encounter: Payer: Self-pay | Admitting: Family Medicine

## 2024-09-24 ENCOUNTER — Ambulatory Visit

## 2024-09-24 ENCOUNTER — Encounter

## 2024-09-24 ENCOUNTER — Ambulatory Visit (INDEPENDENT_AMBULATORY_CARE_PROVIDER_SITE_OTHER): Admitting: Family Medicine

## 2024-09-24 VITALS — BP 128/70 | HR 87 | Temp 98.0°F | Resp 16 | Ht 74.0 in | Wt 239.0 lb

## 2024-09-24 DIAGNOSIS — M545 Low back pain, unspecified: Secondary | ICD-10-CM | POA: Diagnosis not present

## 2024-09-24 NOTE — Patient Instructions (Addendum)

## 2024-09-24 NOTE — Telephone Encounter (Signed)
 Pt has scheduled OV 09/24/2024.

## 2024-09-24 NOTE — Progress Notes (Signed)
 Musculoskeletal Exam  Patient: Mike Jimenez DOB: 06-13-1951  DOS: 09/24/2024  SUBJECTIVE:  Chief Complaint:   Chief Complaint  Patient presents with   Back Pain    Back Pain    [name] is a 73 y.o.  male for evaluation and treatment of back pain.   Onset:  2 months ago.  Almost fell handling his dogs (40-50 lbs) he did not fall but jolted and caused pain.  Location: lower L  Character:  dull and sharp  Progression of issue:  waxes and wanes Associated symptoms: hurts w certain movements Denies bowel/bladder incontinence or weakness, bruising, swelling, redness Treatment: to date has been acetaminophen , heat, bracing.   Neurovascular symptoms: no  Past Medical History:  Diagnosis Date   Anemia    Arthritis    knees (09/05/2014)   Automatic implantable cardioverter-defibrillator in situ    CAD (coronary artery disease)    a. Mild nonobstructive CAD by cath 08/2014.   CHF (congestive heart failure) (HCC)    Childhood asthma    Gastric ulcer 2011   GERD (gastroesophageal reflux disease)    History of gout    Hyperlipidemia    Hypertension    LBBB (left bundle branch block)    NICM (nonischemic cardiomyopathy) (HCC)    a. Dx 2011 - presented with sustained VT. Normal coronaries at that time, EF 10-15% by cath and 20-25% by echo. b. s/p biventricular ICD 2011. c. CP 08/2014: mild nonobstructive CAD.   Obesity    Postsurgical hypothyroidism    Thyroid  cancer (HCC)    a. 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 Dr Eletha. s/p thyroidectomy 2013.   Torn meniscus    Type II diabetes mellitus (HCC)    Ventricular tachycardia (HCC)    a. Sustained VT 2011 s/p MDT [id number] Concert BiV ICD, ser# [id number] H.    Objective:  VITAL SIGNS: BP 128/70 (BP Location: Left Arm, Patient Position: Sitting)   Pulse 87   Temp 98 F (36.7 C) (Oral)   Resp 16   Ht 6' 2 (1.88 m)   Wt 239 lb (108.4 kg)   SpO2 98%   BMI 30.69 kg/m  Constitutional: Well formed, well  developed. No acute distress. HENT: Normocephalic, atraumatic.  Thorax & Lungs:  No accessory muscle use Musculoskeletal: low back.   Tenderness to palpation: no Deformity: no Ecchymosis: no Straight leg test: negative for Poor hamstring flexibility b/l. Neurologic: Normal sensory function. No focal deficits noted. DTR's equal and symmetric in LE's. No clonus. Psychiatric: Normal mood. Age appropriate judgment and insight. Alert & oriented x 3.    Assessment:  Acute left-sided low back pain without sciatica  Plan: Stretches/exercises, heat, ice, Tylenol , PT if no better in next 3-4 weeks. F/u prn. The patient voiced understanding and agreement to the plan.   Mabel [place], DO 09/24/24  1:43 PM

## 2024-09-24 NOTE — Telephone Encounter (Signed)
 FYI Only or Action Required?: FYI only for provider.  Patient was last seen in primary care on 05/07/2024 by Amon Aloysius BRAVO, MD.  Called Nurse Triage reporting Back Pain.  Symptoms began about a month ago.  Interventions attempted: OTC medications: Tylenol , Rest, hydration, or home remedies, and Ice/heat application.  Symptoms are: gradually worsening.  Triage Disposition: See PCP When Office is Open (Within 3 Days)  Patient/caregiver understands and will follow disposition?: Yes                            Copied from CRM [id number]. Topic: Clinical - Red Word Triage >> Sep 24, 2024 10:24 AM Rosina BIRCH wrote: Red Word that prompted transfer to Nurse Triage: having lower back pain that was minor for about a month and have recently become more severe when he moves in the last week. patient stated there is no dicomfort when sitting and he is not doing any twisting movement Reason for Disposition  [1] MODERATE back pain (e.g., interferes with normal activities) AND [2] present > 3 days  Answer Assessment - Initial Assessment Questions 1. ONSET: When did the pain begin? (e.g., minutes, hours, days)     Mild pain for about a month, worsened within the last week 2. LOCATION: Where does it hurt? (upper, mid or lower back)     Left lower side 3. SEVERITY: How bad is the pain?  (e.g., Scale 1-10; mild, moderate, or severe)     Rates pain a 4-5 at this time, states pain worsens depending on movement  4. PATTERN: Is the pain constant? (e.g., yes, no; constant, intermittent)      Constant  5. RADIATION: Does the pain shoot into your legs or somewhere else?     Radiates to left side of abdomin occasionally 6. CAUSE:  What do you think is causing the back pain?      Unsure 7. BACK OVERUSE:  Any recent lifting of heavy objects, strenuous work or exercise?     Denies 8. MEDICINES: What have you taken so far for the pain? (e.g., nothing, acetaminophen , NSAIDS)      Tylenol   9. NEUROLOGIC SYMPTOMS: Do you have any weakness, numbness, or problems with bowel/bladder control?     Denies weakness, denies numbness, denies trouble with urination  10. OTHER SYMPTOMS: Do you have any other symptoms? (e.g., fever, abdomen pain, burning with urination, blood in urine)     Denies fever, states pain does not impact ability to walk normally 11. PREGNANCY: Is there any chance you are pregnant? When was your last menstrual period?     N/A  Protocols used: Back Pain-A-AH

## 2024-09-27 ENCOUNTER — Encounter: Payer: Self-pay | Admitting: Internal Medicine

## 2024-09-28 NOTE — Progress Notes (Signed)
 Mike Jimenez                                          MRN: [id number]   09/28/2024   The [facility] Quality Team Specialist reviewed this patient medical record for the purposes of chart review for care gap closure. The following were reviewed: abstraction for care gap closure-glycemic status assessment.    [facility] Quality Team

## 2024-10-01 ENCOUNTER — Ambulatory Visit: Attending: Internal Medicine

## 2024-10-01 DIAGNOSIS — N182 Chronic kidney disease, stage 2 (mild): Secondary | ICD-10-CM | POA: Diagnosis not present

## 2024-10-01 DIAGNOSIS — I5022 Chronic systolic (congestive) heart failure: Secondary | ICD-10-CM

## 2024-10-01 DIAGNOSIS — Z95810 Presence of automatic (implantable) cardiac defibrillator: Secondary | ICD-10-CM | POA: Diagnosis not present

## 2024-10-05 NOTE — Progress Notes (Signed)
 EPIC Encounter for ICM Monitoring  Patient Name: Mike Jimenez is a 73 y.o. male Date: 10/05/2024 Primary Care Physican: Amon Aloysius BRAVO, MD Primary Cardiologist: [name] Electrophysiologist: [name] Pore Pacing:  99.5% 01/25/2024 Weight: 230-235 lbs 03/06/2024 Office Weight: 234 lbs 08/09/2024 Office Weight: 238 lbs 10/05/2024 Weight: 234-238 lbs   Clinical Status Since 11-Sep-2024 Time in AT/AF  0.0 hr/day (0.0%)                                          Spoke with patient and heart failure questions reviewed.  Transmission results reviewed.  Pt asymptomatic for fluid accumulation.  His energy level increased after device battery replacement and he is feeling well.    OptiVol Thoracic impedance suggesting normal fluid levels since battery .   Prescribed:  Furosemide  20 mg 1 tablet (20 mg total) daily.   Potassium 10 mEq 1 tablet (10 mEq total) daily   Labs: 08/20/2024 Creatinine 1.19, BUN 20, Potassium 4.7, Sodium 143, GFR 65 06/14/2024 Creatinine 1.1,   BUN 16, Potassium 4.1, Sodium 139, GFR 73 05/30/2024 Creatinine 0.9,   BUN 14, Potassium 4.4, Sodium 141, GFR 88 05/23/2024 Creatinine 1.11, BUN 19, Potassium 3.9, Sodium 143, GFR 66.43 05/07/2024 Creatinine 1.02, BUN 15, Potassium 4.0, Sodium 141, GFR 73.55 01/06/2024 Creatinine 1.06, BUN 15, Potassium 4.4, Sodium 141, GFR 70.40 A complete set of results can be found in Results Review.   Recommendations:  No changes and encouraged to call if experiencing any fluid symptoms.   Follow-up plan: ICM clinic phone appointment on 11/12/2024.   91 day device clinic remote transmission 10/10/2024.     EP/Cardiology Office Visits:  11/28/2024 with Dr [name].  Recall 10/09/2024 with Jodie Passey, PA   Copy of ICM check sent to Dr. [name].  Remote monitoring is medically necessary for Heart Failure Management.    Daily Thoracic Impedance ICM trend: 08/29/2024 through 10/01/2024.    12-14 Month Thoracic Impedance ICM trend:     [name], RN 10/05/2024 2:53 PM

## 2024-10-08 ENCOUNTER — Encounter

## 2024-10-09 DIAGNOSIS — N182 Chronic kidney disease, stage 2 (mild): Secondary | ICD-10-CM | POA: Diagnosis not present

## 2024-10-09 DIAGNOSIS — R809 Proteinuria, unspecified: Secondary | ICD-10-CM | POA: Diagnosis not present

## 2024-10-09 DIAGNOSIS — D631 Anemia in chronic kidney disease: Secondary | ICD-10-CM | POA: Diagnosis not present

## 2024-10-09 DIAGNOSIS — E785 Hyperlipidemia, unspecified: Secondary | ICD-10-CM | POA: Diagnosis not present

## 2024-10-09 DIAGNOSIS — I129 Hypertensive chronic kidney disease with stage 1 through stage 4 chronic kidney disease, or unspecified chronic kidney disease: Secondary | ICD-10-CM | POA: Diagnosis not present

## 2024-10-09 DIAGNOSIS — I5022 Chronic systolic (congestive) heart failure: Secondary | ICD-10-CM | POA: Diagnosis not present

## 2024-10-09 DIAGNOSIS — E1122 Type 2 diabetes mellitus with diabetic chronic kidney disease: Secondary | ICD-10-CM | POA: Diagnosis not present

## 2024-10-10 ENCOUNTER — Ambulatory Visit: Attending: Internal Medicine

## 2024-10-10 DIAGNOSIS — I5022 Chronic systolic (congestive) heart failure: Secondary | ICD-10-CM | POA: Diagnosis not present

## 2024-10-11 LAB — CUP PACEART REMOTE DEVICE CHECK
Battery Remaining Longevity: 115 mo
Battery Voltage: 3.03 V
Brady Statistic RV Percent Paced: 98.44 %
Date Time Interrogation Session: 20251112052136
HighPow Impedance: 38 Ohm
HighPow Impedance: 51 Ohm
Implantable Lead Connection Status: 753985
Implantable Lead Connection Status: 753985
Implantable Lead Connection Status: 753985
Implantable Lead Implant Date: 20110516
Implantable Lead Implant Date: 20110516
Implantable Lead Implant Date: 20110516
Implantable Lead Location: 753858
Implantable Lead Location: 753859
Implantable Lead Location: 753860
Implantable Lead Model: 5076
Implantable Lead Model: 6947
Implantable Pulse Generator Implant Date: 20251001
Lead Channel Impedance Value: 247 Ohm
Lead Channel Impedance Value: 304 Ohm
Lead Channel Impedance Value: 399 Ohm
Lead Channel Impedance Value: 437 Ohm
Lead Channel Impedance Value: 570 Ohm
Lead Channel Impedance Value: 874 Ohm
Lead Channel Pacing Threshold Amplitude: 0.75 V
Lead Channel Pacing Threshold Amplitude: 0.75 V
Lead Channel Pacing Threshold Amplitude: 1 V
Lead Channel Pacing Threshold Pulse Width: 0.4 ms
Lead Channel Pacing Threshold Pulse Width: 0.4 ms
Lead Channel Pacing Threshold Pulse Width: 0.6 ms
Lead Channel Sensing Intrinsic Amplitude: 1.3 mV
Lead Channel Setting Pacing Amplitude: 1.25 V
Lead Channel Setting Pacing Amplitude: 1.5 V
Lead Channel Setting Pacing Amplitude: 2 V
Lead Channel Setting Pacing Pulse Width: 0.4 ms
Lead Channel Setting Pacing Pulse Width: 0.6 ms
Lead Channel Setting Sensing Sensitivity: 0.3 mV
Zone Setting Status: 755011

## 2024-10-12 ENCOUNTER — Other Ambulatory Visit: Payer: Self-pay | Admitting: Nephrology

## 2024-10-12 DIAGNOSIS — N182 Chronic kidney disease, stage 2 (mild): Secondary | ICD-10-CM

## 2024-10-14 ENCOUNTER — Ambulatory Visit: Payer: Self-pay | Admitting: Internal Medicine

## 2024-10-15 NOTE — Progress Notes (Signed)
 Remote ICD Transmission

## 2024-10-22 ENCOUNTER — Encounter: Payer: Self-pay | Admitting: Internal Medicine

## 2024-10-23 ENCOUNTER — Ambulatory Visit: Payer: Self-pay | Admitting: Family

## 2024-10-23 ENCOUNTER — Ambulatory Visit: Admitting: Family

## 2024-10-23 ENCOUNTER — Ambulatory Visit (HOSPITAL_BASED_OUTPATIENT_CLINIC_OR_DEPARTMENT_OTHER)
Admission: RE | Admit: 2024-10-23 | Discharge: 2024-10-23 | Disposition: A | Discharge status: Home / Self Care | Source: Ambulatory Visit | Attending: Family | Admitting: Family

## 2024-10-23 VITALS — BP 130/60 | HR 66 | Temp 99.2°F | Resp 16 | Ht 74.0 in | Wt 237.0 lb

## 2024-10-23 DIAGNOSIS — Z95810 Presence of automatic (implantable) cardiac defibrillator: Secondary | ICD-10-CM | POA: Diagnosis not present

## 2024-10-23 DIAGNOSIS — R051 Acute cough: Secondary | ICD-10-CM | POA: Insufficient documentation

## 2024-10-23 DIAGNOSIS — I5023 Acute on chronic systolic (congestive) heart failure: Secondary | ICD-10-CM

## 2024-10-23 DIAGNOSIS — E1165 Type 2 diabetes mellitus with hyperglycemia: Secondary | ICD-10-CM

## 2024-10-23 DIAGNOSIS — R0602 Shortness of breath: Secondary | ICD-10-CM | POA: Diagnosis not present

## 2024-10-23 DIAGNOSIS — Z794 Long term (current) use of insulin: Secondary | ICD-10-CM

## 2024-10-23 DIAGNOSIS — Z95811 Presence of heart assist device: Secondary | ICD-10-CM | POA: Diagnosis not present

## 2024-10-23 DIAGNOSIS — R059 Cough, unspecified: Secondary | ICD-10-CM | POA: Diagnosis not present

## 2024-10-23 DIAGNOSIS — E119 Type 2 diabetes mellitus without complications: Secondary | ICD-10-CM

## 2024-10-23 DIAGNOSIS — Z85850 Personal history of malignant neoplasm of thyroid: Secondary | ICD-10-CM

## 2024-10-23 DIAGNOSIS — K5140 Inflammatory polyps of colon without complications: Secondary | ICD-10-CM

## 2024-10-23 LAB — COMPREHENSIVE METABOLIC PANEL WITH GFR
ALT: 11 U/L (ref 0–53)
AST: 11 U/L (ref 0–37)
Albumin: 4 g/dL (ref 3.5–5.2)
Alkaline Phosphatase: 62 U/L (ref 39–117)
BUN: 24 mg/dL — ABNORMAL HIGH (ref 6–23)
CO2: 26 meq/L (ref 19–32)
Calcium: 8.3 mg/dL — ABNORMAL LOW (ref 8.4–10.5)
Chloride: 100 meq/L (ref 96–112)
Creatinine, Ser: 1.33 mg/dL (ref 0.40–1.50)
GFR: 53.32 mL/min — ABNORMAL LOW (ref >60.00)
Glucose, Bld: 293 mg/dL — ABNORMAL HIGH (ref 70–99)
Potassium: 4.4 meq/L (ref 3.5–5.1)
Sodium: 136 meq/L (ref 135–145)
Total Bilirubin: 0.8 mg/dL (ref 0.2–1.2)
Total Protein: 6.8 g/dL (ref 6.0–8.3)

## 2024-10-23 LAB — CBC WITH DIFFERENTIAL/PLATELET
Basophils Absolute: 0.1 K/uL (ref 0.0–0.1)
Basophils Relative: 0.5 % (ref 0.0–3.0)
Eosinophils Absolute: 0.1 K/uL (ref 0.0–0.7)
Eosinophils Relative: 1.4 % (ref 0.0–5.0)
HCT: 33.8 % — ABNORMAL LOW (ref 39.0–52.0)
Hemoglobin: 11.9 g/dL — ABNORMAL LOW (ref 13.0–17.0)
Lymphocytes Relative: 5.5 % — ABNORMAL LOW (ref 12.0–46.0)
Lymphs Abs: 0.6 K/uL — ABNORMAL LOW (ref 0.7–4.0)
MCHC: 35.1 g/dL (ref 30.0–36.0)
MCV: 90.4 fl (ref 78.0–100.0)
Monocytes Absolute: 0.8 K/uL (ref 0.1–1.0)
Monocytes Relative: 7.9 % (ref 3.0–12.0)
Neutro Abs: 8.7 K/uL — ABNORMAL HIGH (ref 1.4–7.7)
Neutrophils Relative %: 84.7 % — ABNORMAL HIGH (ref 43.0–77.0)
Platelets: 295 K/uL (ref 150.0–400.0)
RBC: 3.74 Mil/uL — ABNORMAL LOW (ref 4.22–5.81)
RDW: 12.3 % (ref 11.5–15.5)
WBC: 10.3 K/uL (ref 4.0–10.5)

## 2024-10-23 MED ORDER — BENZONATATE 100 MG PO CAPS: 100.0000 mg | ORAL_CAPSULE | Freq: Three times a day (TID) | ORAL | 0 refills | Status: DC | PRN

## 2024-10-23 MED ORDER — FREESTYLE LIBRE 3 PLUS SENSOR MISC: 3 refills | Status: AC

## 2024-10-23 MED ORDER — ALBUTEROL SULFATE HFA 108 (90 BASE) MCG/ACT IN AERS: 2.0000 | INHALATION_SPRAY | Freq: Four times a day (QID) | RESPIRATORY_TRACT | 0 refills | Status: AC | PRN

## 2024-10-23 NOTE — Addendum Note (Signed)
 Addended by: DARYL SETTER on: 10/23/2024 01:40 PM   Modules accepted: Orders

## 2024-10-23 NOTE — Assessment & Plan Note (Addendum)
 Dx 2011 - presented with sustained VT. Normal coronaries at that time, EF 10-15% by cath and 20-25% by echo. s/p biventricular ICD 2011.  Increased shortness of breath, concern for fluid overload contributing to symptoms. - Increased furosemide  to 40 mg daily for 3 days then return to 20 mg once daily.  - Ordered chest x-ray to assess for fluid overload.

## 2024-10-23 NOTE — Assessment & Plan Note (Signed)
 Last colo 2022, due for follow up colo 2027.

## 2024-10-23 NOTE — Assessment & Plan Note (Addendum)
 New. Obtain CXR to rule out PNA/Volume overload. Rx provided for albuterol  in place of primatene Mist. Plan abx if + for PNA.

## 2024-10-23 NOTE — Assessment & Plan Note (Addendum)
 Had exchange of biventricular medronic ICD in October- feeling better overall since then.

## 2024-10-23 NOTE — Assessment & Plan Note (Signed)
  Blood sugars over 300 mg/dL. Discussed insulin  dosage increase to achieve fasting blood sugar of 110 mg/dL or less. Advised against Jardiance  due to dehydration risk with diuretics. - Increase insulin  by 2 units every 3 days until fasting blood sugar is 110 mg/dL or less, then continue at that dose until you see Dr. Faythe.  - Prescribed Freestyle Libre Plus for continuous glucose monitoring. - Monitor for hypoglycemia during insulin  titration. - Keep upcoming appointment with Dr. Faythe (Endo) in January.

## 2024-10-23 NOTE — Patient Instructions (Addendum)
  VISIT SUMMARY: You came in today because you have been experiencing palpitations, cough, and congestion for the past one to two weeks. Your symptoms worsened last week, causing you to feel very tired and less active. You also had a low-grade fever and increased shortness of breath. We discussed your heart disease, diabetes, and recent pacemaker replacement. We reviewed your current medications and made some adjustments to help manage your symptoms.  YOUR PLAN: -ACUTE COUGH AND CONGESTION: You have been experiencing a cough and congestion with a low-grade fever. This could be due to pneumonia or a worsening of your heart condition. We have ordered a chest x-ray to check for pneumonia or fluid buildup in your lungs. We also increased your furosemide  dose to 40 mg daily for the next 3 days to help reduce any potential fluid overload. You may also use albuterol  as needed for wheezing.  -TYPE 2 DIABETES MELLITUS, POORLY CONTROLLED: Your blood sugar levels have been very high, over 300 mg/dL. We discussed increasing your insulin  dose to help bring your fasting blood sugar down to 110 mg/dL or less. You should increase your insulin  by 2 units every 3 days until your fasting blood sugar is at the target level. We also prescribed a Freestyle Libre Plus device for continuous glucose monitoring. Be sure to watch for signs of low blood sugar as you adjust your insulin .  -CONGESTIVE HEART FAILURE, STATUS POST PACEMAKER REPLACEMENT: You have been experiencing increased shortness of breath, which may be due to fluid buildup. We increased your furosemide  dose to 40 mg daily for the next 3 days to help reduce any fluid overload. We also ordered a chest x-ray to check for fluid in your lungs.  INSTRUCTIONS: Please follow up with the chest x-ray as soon as possible. Continue to monitor your blood sugar levels closely and adjust your insulin  as discussed. Watch for any signs of low blood sugar, such as dizziness or  sweating. If your symptoms worsen or you experience any new symptoms, please contact our office immediately. You have an appointment with your endocrinologist in January, and it is important to keep that appointment for further management of your diabetes.

## 2024-10-23 NOTE — Assessment & Plan Note (Signed)
 S/p total thyroidectomy 2013. Pt to keep upcoming appointment with Endocrinology.

## 2024-10-23 NOTE — Progress Notes (Signed)
 Subjective:     Patient ID: Mike Jimenez, male    DOB: 1950/12/29, 73 y.o.   MRN: [id number]  Chief Complaint  Patient presents with   Cough    Patient complains of cough for about 2 to 3 weks   Nasal Congestion    Patient complains of nasal and chest congestion   Fatigue    Patient complains of feeling tired    Cough    Discussed the use of AI scribe software for clinical note transcription with the patient, who gave verbal consent to proceed.  History of Present Illness Mike Jimenez is a 73 year old male with heart disease and diabetes who presents with palpitations and respiratory symptoms.  He has been experiencing palpitations and respiratory symptoms, including a cough and congestion, for about one to two weeks. The symptoms worsened last week, causing lethargy and reluctance to move. The cough is exacerbated when lying down. He uses Primatene to alleviate symptoms, which allows for more activity, but cough and congestion persist.  He has had a low-grade fever, with temperatures reaching 99.4 to 99.63F, higher than his usual baseline. There is increased shortness of breath compared to his normal state. He does not use supplemental oxygen at home but practices deep breathing exercises.  He has a history of heart disease, including a pacemaker that was replaced on October 1st. He denies abnormal swelling in the legs but mentions having neuropathy. He is currently taking furosemide  20 mg daily.  His diabetes management is challenging due to high blood sugar levels, with readings over 300 mg/dL. He is under the care of an endocrinologist and has an appointment scheduled in January. He is currently taking 10 units of insulin  daily. He does not recall his last A1c value.  He mentions a previous hospitalization due to dehydration while on Jardiance  and Ozempic. He also sees a renal doctor.      Health Maintenance Due  Topic Date Due   COVID-19 Vaccine (7 - 2025-26 season)  07/30/2024   HEMOGLOBIN A1C  09/21/2024    Past Medical History:  Diagnosis Date   Anemia    Arthritis    knees (09/05/2014)   Automatic implantable cardioverter-defibrillator in situ    CAD (coronary artery disease)    a. Mild nonobstructive CAD by cath 08/2014.   CHF (congestive heart failure) (HCC)    Childhood asthma    Gastric ulcer 2011   GERD (gastroesophageal reflux disease)    History of gout    Hyperlipidemia    Hypertension    LBBB (left bundle branch block)    NICM (nonischemic cardiomyopathy) (HCC)    a. Dx 2011 - presented with sustained VT. Normal coronaries at that time, EF 10-15% by cath and 20-25% by echo. b. s/p biventricular ICD 2011. c. CP 08/2014: mild nonobstructive CAD.   Obesity    Postsurgical hypothyroidism    Thyroid  cancer (HCC)    a. 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 Dr Eletha. s/p thyroidectomy 2013.   Torn meniscus    Type II diabetes mellitus (HCC)    Ventricular tachycardia (HCC)    a. Sustained VT 2011 s/p MDT [id number] Concert BiV ICD, ser# [id number].    Past Surgical History:  Procedure Laterality Date   BI-VENTRICULAR IMPLANTABLE CARDIOVERTER DEFIBRILLATOR  (CRT-D)  03/2010   BIOPSY  08/06/2021   Procedure: BIOPSY;  Surgeon: Abran Mike SAILOR, MD;  Location: [facility] ENDOSCOPY;  Service: Endoscopy;;   BIV ICD GENERATOR CHANGEOUT N/A 08/29/2024  Procedure: BIV ICD GENERATOR CHANGEOUT;  Surgeon: [name] Danelle ORN, MD;  Location: [facility] INVASIVE CV LAB;  Service: Cardiovascular;  Laterality: N/A;   CARDIAC CATHETERIZATION  03/2010   COLONOSCOPY WITH PROPOFOL  N/A 08/06/2021   Procedure: COLONOSCOPY WITH PROPOFOL ;  Surgeon: Abran Mike SAILOR, MD;  Location: [facility];  Service: Endoscopy;  Laterality: N/A;   EP IMPLANTABLE DEVICE N/A 05/21/2016   Procedure:  ICD Generator Changeout;  Surgeon: Danelle ORN [name], MD;  Location: [facility] INVASIVE CV LAB;  Service: Cardiovascular;  Laterality: N/A;   INSERTION OF ILIAC STENT Left 11/12/2023   Procedure:  INSERTION OF LEFT ILIAC VEIN STENT;  Surgeon: Magda Debby SAILOR, MD;  Location: [facility] OR;  Service: Vascular;  Laterality: Left;   LEFT HEART CATHETERIZATION WITH CORONARY ANGIOGRAM N/A 09/06/2014   Procedure: LEFT HEART CATHETERIZATION WITH CORONARY ANGIOGRAM;  Surgeon: Lonni JONETTA Cash, MD;  Location: [facility] CATH LAB;  Service: Cardiovascular;  Laterality: N/A;   LOWER EXTREMITY VENOGRAPHY Left 11/11/2023   Procedure: LOWER EXTREMITY VENOGRAPHY;  Surgeon: Magda Debby SAILOR, MD;  Location: [facility] INVASIVE CV LAB;  Service: Cardiovascular;  Laterality: Left;   PERIPHERAL VASCULAR THROMBECTOMY Left 11/11/2023   Procedure: PERIPHERAL VASCULAR THROMBECTOMY;  Surgeon: Magda Debby SAILOR, MD;  Location: [facility] INVASIVE CV LAB;  Service: Cardiovascular;  Laterality: Left;   PERIPHERAL VASCULAR ULTRASOUND/IVUS Left 11/11/2023   Procedure: Peripheral Vascular Ultrasound/IVUS;  Surgeon: Magda Debby SAILOR, MD;  Location: [facility] INVASIVE CV LAB;  Service: Cardiovascular;  Laterality: Left;   POLYPECTOMY  08/06/2021   Procedure: POLYPECTOMY;  Surgeon: Abran Mike SAILOR, MD;  Location: [facility] ENDOSCOPY;  Service: Endoscopy;;   PTX     traumatic, age 62 months   THYROIDECTOMY  12/10/2011   Procedure: TOTAL THYROIDECTOMY WITH CENTRAL COMPARTMENT DISSECTION;  Surgeon: Krystal CHRISTELLA Spinner, MD;  Location[facility] OR;  Service: General;  Laterality: N/A;  Total thyroidectomy with limited central compartment lymph node dissection.   VASECTOMY      Family History  Problem Relation Age of Onset   Atrial fibrillation Father        diseased at ag 86   Hypertension Father        alive @ 38.   Hypothyroidism Mother    Hypertension Mother        alive @ 57.   Diabetes Maternal Grandfather    Diabetes Maternal Aunt    Colon cancer Maternal Aunt    Colon cancer Maternal Aunt    Lung cancer Paternal Uncle    Lung cancer Maternal Uncle    Asthma Sister    Hypothyroidism Daughter    Prostate cancer Neg Hx    CAD Neg Hx     Social History    Socioeconomic History   Marital status: Married    Spouse name: Not on file   Number of children: 2   Years of education: Not on file   Highest education level: Bachelor's degree (e.g., BA, AB, BS)  Occupational History   Occupation: [employer]   Occupation: to retire 2023  Tobacco Use   Smoking status: Former    Current packs/day: 0.00    Average packs/day: 2.5 packs/day for 2.0 years (5.0 ttl pk-yrs)    Types: Cigarettes    Start date: 11/29/1968    Quit date: 11/29/1970    Years since quitting: 53.9   Smokeless tobacco: Former    Types: Engineer, Drilling   Vaping status: Never Used  Substance and Sexual Activity   Alcohol use: Yes    Comment: drinks  rarely   Drug use: No   Sexual activity: Yes  Other Topics Concern   Not on file  Social History Narrative   Live w/ wife , daughter [name] lives in [place]   11 dogs, fish pond.       Social Drivers of Corporate Investment Banker Strain: Low Risk  (10/22/2024)   Overall Financial Resource Strain (CARDIA)    Difficulty of Paying Living Expenses: Not hard at all  Food Insecurity: No Food Insecurity (10/22/2024)   Hunger Vital Sign    Worried About Running Out of Food in the Last Year: Never true    Ran Out of Food in the Last Year: Never true  Transportation Needs: No Transportation Needs (10/22/2024)   PRAPARE - Administrator, Civil Service (Medical): No    Lack of Transportation (Non-Medical): No  Physical Activity: Insufficiently Active (10/22/2024)   Exercise Vital Sign    Days of Exercise per Week: 2 days    Minutes of Exercise per Session: 60 min  Stress: No Stress Concern Present (10/22/2024)   [employer] of Occupational Health - Occupational Stress Questionnaire    Feeling of Stress: Not at all  Social Connections: Moderately Integrated (10/22/2024)   Social Connection and Isolation Panel    Frequency of Communication with Friends and Family: Three times a week    Frequency of Social Gatherings  with Friends and Family: Once a week    Attends Religious Services: 1 to 4 times per year    Active Member of [employer] or Organizations: No    Attends Engineer, Structural: Not on file    Marital Status: Married  Catering Manager Violence: Not At Risk (03/06/2024)   Humiliation, Afraid, Rape, and Kick questionnaire    Fear of Current or Ex-Partner: No    Emotionally Abused: No    Physically Abused: No    Sexually Abused: No    Outpatient Medications Prior to Visit  Medication Sig Dispense Refill   acetaminophen  (TYLENOL ) 500 MG tablet Take 1,000 mg by mouth every 6 (six) hours as needed (pain).     allopurinol  (ZYLOPRIM ) 100 MG tablet Take 1 tablet (100 mg total) by mouth daily. 90 tablet 1   aspirin  EC 81 MG tablet Take 1 tablet (81 mg total) by mouth daily.     atorvastatin  (LIPITOR) 80 MG tablet Take 80 mg by mouth at bedtime.     carvedilol  (COREG ) 12.5 MG tablet Take 1 tablet (12.5 mg total) by mouth daily. 90 tablet 1   cyanocobalamin (VITAMIN B12) 1000 MCG tablet Take 1,000 mcg by mouth daily.     furosemide  (LASIX ) 20 MG tablet Take 1 tablet (20 mg total) by mouth daily. 90 tablet 1   LANTUS SOLOSTAR 100 UNIT/ML Solostar Pen Inject 10 Units into the skin daily. Titrate up when needed, max daily dose of 50 units     levothyroxine  (SYNTHROID ) 112 MCG tablet Take 1.5 tablets (168 mcg total) by mouth daily before breakfast.     losartan  (COZAAR ) 100 MG tablet Take 0.5 tablets (50 mg total) by mouth daily. (Patient taking differently: Take 100 mg by mouth daily.)     metFORMIN  (GLUCOPHAGE ) 1000 MG tablet Take 1 tablet (1,000 mg total) by mouth 2 (two) times daily with a meal. 180 tablet 1   Multiple Vitamin (MULTIVITAMIN WITH MINERALS) TABS tablet Take 1 tablet by mouth daily.     OVER THE COUNTER MEDICATION Take 5 tablets by mouth at bedtime as  needed (sleep). CBD gummy     spironolactone (ALDACTONE) 25 MG tablet Take 25 mg by mouth daily.     tadalafil  (CIALIS ) 20 MG tablet Take  1 tablet (20 mg total) by mouth daily as needed for erectile dysfunction. 10 tablet 5   vitamin E 400 UNIT capsule Take 400 Units by mouth daily.     No facility-administered medications prior to visit.    Allergies  Allergen Reactions   Bee Venom Swelling   Amiodarone  Other (See Comments)   Ramipril Cough    Review of Systems  Respiratory:  Positive for cough.    See HPI    Objective:    Physical Exam Constitutional:      General: He is not in acute distress.    Appearance: He is well-developed.  HENT:     Head: Normocephalic and atraumatic.  Cardiovascular:     Rate and Rhythm: Normal rate and regular rhythm.     Heart sounds: No murmur heard. Pulmonary:     Effort: Pulmonary effort is normal. No respiratory distress.     Breath sounds: Decreased air movement present. Decreased breath sounds present. No wheezing or rales.  Skin:    General: Skin is warm and dry.  Neurological:     Mental Status: He is alert and oriented to person, place, and time.  Psychiatric:        Behavior: Behavior normal.        Thought Content: Thought content normal.      BP 130/60 (BP Location: Right Arm, Patient Position: Sitting, Cuff Size: Large)   Pulse 66   Temp 99.2 F (37.3 C) (Oral)   Resp 16   Ht 6' 2 (1.88 m)   Wt 237 lb (107.5 kg)   SpO2 93%   BMI 30.43 kg/m  Wt Readings from Last 3 Encounters:  10/23/24 237 lb (107.5 kg)  09/24/24 239 lb (108.4 kg)  08/29/24 235 lb (106.6 kg)       Assessment & Plan:   Problem List Items Addressed This Visit       Unprioritized   Uncontrolled type 2 diabetes mellitus with hyperglycemia (HCC) - Primary    Blood sugars over 300 mg/dL. Discussed insulin  dosage increase to achieve fasting blood sugar of 110 mg/dL or less. Advised against Jardiance  due to dehydration risk with diuretics. - Increase insulin  by 2 units every 3 days until fasting blood sugar is 110 mg/dL or less, then continue at that dose until you see Dr. Faythe.   - Prescribed Freestyle Libre Plus for continuous glucose monitoring. - Monitor for hypoglycemia during insulin  titration. - Keep upcoming appointment with Dr. Faythe (Endo) in January.        Relevant Medications   Continuous Glucose Sensor (FREESTYLE LIBRE 3 PLUS SENSOR) MISC   Pseudopolyposis of colon without complication, unspecified part of colon (HCC)   Last colo 2022, due for follow up colo 2027.      Presence of heart assist device [facility])   Had exchange of biventricular medronic ICD in October- feeling better overall since then.       History of thyroid  cancer   S/p total thyroidectomy 2013. Pt to keep upcoming appointment with Endocrinology.       Acute on chronic systolic congestive heart failure (HCC)   Dx 2011 - presented with sustained VT. Normal coronaries at that time, EF 10-15% by cath and 20-25% by echo. s/p biventricular ICD 2011.  Increased shortness of breath, concern for fluid overload contributing  to symptoms. - Increased furosemide  to 40 mg daily for 3 days then return to 20 mg once daily.  - Ordered chest x-ray to assess for fluid overload.       Acute cough   New. Obtain CXR to rule out PNA/Volume overload. Rx provided for albuterol  in place of primatene Mist. Plan abx if + for PNA.       Relevant Medications   albuterol  (VENTOLIN  HFA) 108 (90 Base) MCG/ACT inhaler   Other Relevant Orders   Comp Met (CMET)   CBC w/Diff   DG Chest 2 View   Assessment & Plan     I am having Mike Jimenez start on FreeStyle Libre 3 Plus Sensor and albuterol . I am also having him maintain his vitamin E, aspirin  EC, multivitamin with minerals, acetaminophen , tadalafil , OVER THE COUNTER MEDICATION, metFORMIN , atorvastatin , cyanocobalamin, Lantus SoloStar, levothyroxine , losartan , spironolactone, furosemide , allopurinol , and carvedilol .  Meds ordered this encounter  Medications   Continuous Glucose Sensor (FREESTYLE LIBRE 3 PLUS SENSOR) MISC    Sig: Change sensor every  15 days.    Dispense:  6 each    Refill:  3    Supervising Provider:   DOMENICA BLACKBIRD A [[id number]]   albuterol  (VENTOLIN  HFA) 108 (90 Base) MCG/ACT inhaler    Sig: Inhale 2 puffs into the lungs every 6 (six) hours as needed for wheezing or shortness of breath.    Dispense:  8 g    Refill:  0    Supervising Provider:   DOMENICA BLACKBIRD A [[id number]]

## 2024-10-24 NOTE — Telephone Encounter (Signed)
 Reviewed labs and x-ray results with patient. He reports significant improvement in his breathing with the addition of prn albuterol .

## 2024-11-01 ENCOUNTER — Inpatient Hospital Stay: Admission: RE | Admit: 2024-11-01 | Discharge: 2024-11-01 | Attending: Nephrology

## 2024-11-01 DIAGNOSIS — N182 Chronic kidney disease, stage 2 (mild): Secondary | ICD-10-CM | POA: Diagnosis not present

## 2024-11-06 ENCOUNTER — Ambulatory Visit: Admitting: Internal Medicine

## 2024-11-12 ENCOUNTER — Ambulatory Visit

## 2024-11-12 DIAGNOSIS — I5022 Chronic systolic (congestive) heart failure: Secondary | ICD-10-CM | POA: Diagnosis not present

## 2024-11-12 DIAGNOSIS — Z95810 Presence of automatic (implantable) cardiac defibrillator: Secondary | ICD-10-CM

## 2024-11-13 ENCOUNTER — Encounter: Payer: Self-pay | Admitting: Internal Medicine

## 2024-11-16 ENCOUNTER — Telehealth: Payer: Self-pay

## 2024-11-16 NOTE — Telephone Encounter (Signed)
 Remote ICM transmission received.  Attempted call to patient regarding ICM remote transmission.  Left detailed message per DPR with ICM phone number to return call for any questions, concerns or fluid symptoms.

## 2024-11-16 NOTE — Progress Notes (Signed)
 EPIC Encounter for ICM Monitoring  Patient Name: Mike Jimenez is a 73 y.o. male Date: 11/16/2024 Primary Care Physican: Amon Aloysius BRAVO, MD Primary Cardiologist: [name] Electrophysiologist: [name] Pore Pacing:  99.2% 01/25/2024 Weight: 230-235 lbs 03/06/2024 Office Weight: 234 lbs 08/09/2024 Office Weight: 238 lbs 10/05/2024 Weight: 234-238 lbs   Clinical Status (10-Oct-2024 to 12-Nov-2024) Time in AT/AF  0.0 hr/day (0.0%)                                          Attempted call to patient and unable to reach.  Left detailed message per DPR regarding transmission.  Transmission results reviewed.    Since 08/29/2024 Battery Replacement:  OptiVol Thoracic impedance suggesting normal fluid levels.   Prescribed:  Furosemide  20 mg 1 tablet (20 mg total) daily.   Potassium 10 mEq 1 tablet (10 mEq total) daily   Labs: 10/23/2024 Creatinine 1.33, BUN 24, Potassium 4.4, Sodium 136, GFR 53.32 08/20/2024 Creatinine 1.19, BUN 20, Potassium 4.7, Sodium 143, GFR 65 06/14/2024 Creatinine 1.1,   BUN 16, Potassium 4.1, Sodium 139, GFR 73 05/30/2024 Creatinine 0.9,   BUN 14, Potassium 4.4, Sodium 141, GFR 88 05/23/2024 Creatinine 1.11, BUN 19, Potassium 3.9, Sodium 143, GFR 66.43 05/07/2024 Creatinine 1.02, BUN 15, Potassium 4.0, Sodium 141, GFR 73.55 01/06/2024 Creatinine 1.06, BUN 15, Potassium 4.4, Sodium 141, GFR 70.40 A complete set of results can be found in Results Review.   Recommendations: Left voice mail with ICM number and encouraged to call if experiencing any fluid symptoms.   Follow-up plan: ICM clinic phone appointment on 12/17/2024.   91 day device clinic remote transmission 01/09/2025.     EP/Cardiology Office Visits:  11/28/2024 with Dr [name].    Copy of ICM check sent to Dr. [name].  Remote monitoring is medically necessary for Heart Failure Management.    Daily Thoracic Impedance ICM trend: 08/29/2024 through 11/12/2024.    12-14 Month Thoracic Impedance ICM trend:     [name], RN 11/16/2024 8:03 AM

## 2024-11-23 ENCOUNTER — Ambulatory Visit (INDEPENDENT_AMBULATORY_CARE_PROVIDER_SITE_OTHER): Admitting: Internal Medicine

## 2024-11-23 ENCOUNTER — Encounter: Payer: Self-pay | Admitting: Internal Medicine

## 2024-11-23 VITALS — BP 130/70 | HR 78 | Temp 97.4°F | Ht 74.0 in | Wt 237.6 lb

## 2024-11-23 DIAGNOSIS — E1129 Type 2 diabetes mellitus with other diabetic kidney complication: Secondary | ICD-10-CM

## 2024-11-23 DIAGNOSIS — I2583 Coronary atherosclerosis due to lipid rich plaque: Secondary | ICD-10-CM

## 2024-11-23 DIAGNOSIS — M1A9XX0 Chronic gout, unspecified, without tophus (tophi): Secondary | ICD-10-CM | POA: Diagnosis not present

## 2024-11-23 DIAGNOSIS — K529 Noninfective gastroenteritis and colitis, unspecified: Secondary | ICD-10-CM

## 2024-11-23 DIAGNOSIS — I2510 Atherosclerotic heart disease of native coronary artery without angina pectoris: Secondary | ICD-10-CM

## 2024-11-23 DIAGNOSIS — Z794 Long term (current) use of insulin: Secondary | ICD-10-CM

## 2024-11-23 DIAGNOSIS — R809 Proteinuria, unspecified: Secondary | ICD-10-CM

## 2024-11-23 MED ORDER — LOSARTAN POTASSIUM 100 MG PO TABS: 100.0000 mg | ORAL_TABLET | Freq: Every day | ORAL | Status: AC

## 2024-11-23 NOTE — Patient Instructions (Addendum)
 Go to the front desk for the checkout Please make an appointment for a physical exam in 3 months   Will arrange for a GI referral (gastroenterologist) due to the chronic diarrhea.Reach out to them at [phone] and set up an appointment  Continue checking your blood pressure regularly Blood pressure goal:  between 110/65 and  135/85. If it is consistently higher or lower, let me know       TYPE 2 DIABETES MELLITUS:  -Increase Lantus to 16 units daily for 3 days, if you are not having low sugars ( less than 90) at nighttime, then increase to 18 units daily for 3 days. As long as you see no low sugars, increase to 20 units discuss further adjustments with Dr. Faythe.  -Discuss Jardiance  with your endocrinologist and nephrologist.  -Continue monitoring your glucose levels with your continuous glucose monitor.  HEART FAILURE: You have heart failure, and your symptoms have improved with a recent increase in Lasix . -Continue taking your current heart failure medications: carvedilol , Lasix , losartan , and spironolactone. -Monitor your weight daily and report any consistent weight gain of 5 pounds or more.   CHRONIC DIARRHEA: You have intermittent diarrhea, will refer you to gastroenterology      HYPERLIPIDEMIA: You have high cholesterol. -Continue taking atorvastatin  80 mg daily.  GOUT: You have gout. -Continue taking allopurinol  100 mg daily.  GENERAL HEALTH MAINTENANCE: You are up to date on your flu and COVID vaccines.

## 2024-11-23 NOTE — Progress Notes (Signed)
 "  Subjective:    Patient ID: Mike Jimenez, male    DOB: 1950/12/04, 73 y.o.   MRN: [id number]  DOS:  11/23/2024 Follow-up  Discussed the use of AI scribe software for clinical note transcription with the patient, who gave verbal consent to proceed.  History of Present Illness JULIES Jimenez is a 73 year old male with diabetes and heart failure who presents for follow-up of his glucose control and heart condition.  Here with his daughter [name]  Glycemic control - Diabetes managed with continuous glucose monitor, Lantus, and metformin  twice daily - Time in range is approximately 50%, with postprandial hyperglycemic spikes - Glucose values typically under 200 mg/dL - Lantus dose recently increased from 10 to 14 units - Morning glucose values in the low 100s - No hypoglycemic episodes  Heart failure symptoms and management - Heart failure managed with carvedilol , Lasix , losartan , and spironolactone - Temporary increase in Lasix  a month ago for dyspnea - Improved shortness of breath since Lasix  adjustment - Persistent congestion and hoarseness - Prior left ankle swelling has resolved - Daily weight monitoring, ranging from 230 to 238 lb  Musculoskeletal symptoms - History of back surgery for arthritis with 6-week home recovery - No back pain when upright - Right shoulder soreness when sleeping on that side  Gastrointestinal symptoms - Intermittent diarrhea associated with dietary factors - No blood in stool, nausea, or vomiting - Last colonoscopy in September 2022  Preventive care and lifestyle - Takes allopurinol , aspirin , Lipitor, and thyroid  medication - Drinks mostly tea, avoids soda, maintains good fluid intake - Up to date on influenza, pneumonia, and COVID vaccinations    Review of Systems See above   Past Medical History:  Diagnosis Date   Anemia    Arthritis    knees (09/05/2014)   Automatic implantable cardioverter-defibrillator in situ    CAD (coronary artery  disease)    a. Mild nonobstructive CAD by cath 08/2014.   CHF (congestive heart failure) (HCC)    Childhood asthma    Gastric ulcer 2011   GERD (gastroesophageal reflux disease)    History of gout    Hyperlipidemia    Hypertension    LBBB (left bundle branch block)    NICM (nonischemic cardiomyopathy) (HCC)    a. Dx 2011 - presented with sustained VT. Normal coronaries at that time, EF 10-15% by cath and 20-25% by echo. b. s/p biventricular ICD 2011. c. CP 08/2014: mild nonobstructive CAD.   Obesity    Postsurgical hypothyroidism    Thyroid  cancer (HCC)    a. 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 Dr Eletha. s/p thyroidectomy 2013.   Torn meniscus    Type II diabetes mellitus (HCC)    Ventricular tachycardia (HCC)    a. Sustained VT 2011 s/p MDT D274TRK Concert BiV ICD, ser# [id number].    Past Surgical History:  Procedure Laterality Date   BI-VENTRICULAR IMPLANTABLE CARDIOVERTER DEFIBRILLATOR  (CRT-D)  03/2010   BIOPSY  08/06/2021   Procedure: BIOPSY;  Surgeon: Abran Norleen SAILOR, MD;  Location: [facility];  Service: Endoscopy;;   BIV ICD GENERATOR CHANGEOUT N/A 08/29/2024   Procedure: BIV ICD GENERATOR CHANGEOUT;  Surgeon: [name] Danelle LELON, MD;  Location: [facility] INVASIVE CV LAB;  Service: Cardiovascular;  Laterality: N/A;   CARDIAC CATHETERIZATION  03/2010   COLONOSCOPY WITH PROPOFOL  N/A 08/06/2021   Procedure: COLONOSCOPY WITH PROPOFOL ;  Surgeon: Abran Norleen SAILOR, MD;  Location: [facility];  Service: Endoscopy;  Laterality: N/A;   EP IMPLANTABLE  DEVICE N/A 05/21/2016   Procedure:  ICD Generator Changeout;  Surgeon: Danelle LELON Birmingham, MD;  Location: [facility] INVASIVE CV LAB;  Service: Cardiovascular;  Laterality: N/A;   INSERTION OF ILIAC STENT Left 11/12/2023   Procedure: INSERTION OF LEFT ILIAC VEIN STENT;  Surgeon: Magda Debby SAILOR, MD;  Location: [facility] OR;  Service: Vascular;  Laterality: Left;   LEFT HEART CATHETERIZATION WITH CORONARY ANGIOGRAM N/A 09/06/2014   Procedure: LEFT HEART  CATHETERIZATION WITH CORONARY ANGIOGRAM;  Surgeon: Lonni JONETTA Cash, MD;  Location: [facility] CATH LAB;  Service: Cardiovascular;  Laterality: N/A;   LOWER EXTREMITY VENOGRAPHY Left 11/11/2023   Procedure: LOWER EXTREMITY VENOGRAPHY;  Surgeon: Magda Debby SAILOR, MD;  Location: [facility] INVASIVE CV LAB;  Service: Cardiovascular;  Laterality: Left;   PERIPHERAL VASCULAR THROMBECTOMY Left 11/11/2023   Procedure: PERIPHERAL VASCULAR THROMBECTOMY;  Surgeon: Magda Debby SAILOR, MD;  Location: [facility] INVASIVE CV LAB;  Service: Cardiovascular;  Laterality: Left;   PERIPHERAL VASCULAR ULTRASOUND/IVUS Left 11/11/2023   Procedure: Peripheral Vascular Ultrasound/IVUS;  Surgeon: Magda Debby SAILOR, MD;  Location: [facility] INVASIVE CV LAB;  Service: Cardiovascular;  Laterality: Left;   POLYPECTOMY  08/06/2021   Procedure: POLYPECTOMY;  Surgeon: Abran Norleen SAILOR, MD;  Location: [facility];  Service: Endoscopy;;   PTX     traumatic, age 62 months   THYROIDECTOMY  12/10/2011   Procedure: TOTAL THYROIDECTOMY WITH CENTRAL COMPARTMENT DISSECTION;  Surgeon: Krystal CHRISTELLA Spinner, MD;  Location: [facility] OR;  Service: General;  Laterality: N/A;  Total thyroidectomy with limited central compartment lymph node dissection.   VASECTOMY      Current Outpatient Medications  Medication Instructions   acetaminophen  (TYLENOL ) 1,000 mg, Every 6 hours PRN   albuterol  (VENTOLIN  HFA) 108 (90 Base) MCG/ACT inhaler 2 puffs, Inhalation, Every 6 hours PRN   allopurinol  (ZYLOPRIM ) 100 mg, Oral, Daily   aspirin  EC 81 mg, Oral, Daily   atorvastatin  (LIPITOR) 80 mg, Daily at bedtime   carvedilol  (COREG ) 12.5 mg, Oral, Daily   Continuous Glucose Sensor (FREESTYLE LIBRE 3 PLUS SENSOR) MISC Change sensor every 15 days.   cyanocobalamin (VITAMIN B12) 1,000 mcg, Daily   furosemide  (LASIX ) 20 mg, Oral, Daily   Lantus SoloStar 14 Units, Subcutaneous, Daily, Titrate up when needed, max daily dose of 50 units   levothyroxine  (SYNTHROID ) 168 mcg, Oral, Daily before breakfast    losartan  (COZAAR ) 100 mg, Oral, Daily   metFORMIN  (GLUCOPHAGE ) 1,000 mg, Oral, 2 times daily with meals   Multiple Vitamin (MULTIVITAMIN WITH MINERALS) TABS tablet 1 tablet, Daily   OVER THE COUNTER MEDICATION 5 tablets, At bedtime PRN   spironolactone (ALDACTONE) 25 mg, Daily   tadalafil  (CIALIS ) 20 mg, Oral, Daily PRN   vitamin E 400 Units, Daily       Objective:   Physical Exam BP 130/70 (BP Location: Left Arm, Patient Position: Sitting)   Pulse 78   Temp (!) 97.4 F (36.3 C) (Oral)   Ht 6' 2 (1.88 m)   Wt 237 lb 9.6 oz (107.8 kg)   SpO2 98%   BMI 30.51 kg/m  General:   Well developed, NAD, BMI noted. HEENT:  Normocephalic . Face symmetric, atraumatic Lungs:  CTA B Normal respiratory effort, no intercostal retractions, no accessory muscle use. Heart: RRR,  no murmur.  Lower extremities: Trace pretibial edema bilaterally  Skin: Not pale. Not jaundice Neurologic:  alert & oriented X3.  Speech normal, gait appropriate for age and unassisted Psych--  Cognition and judgment appear intact.  Cooperative with normal attention span and  concentration.  Behavior appropriate. No anxious or depressed appearing.      Assessment   Assessment DM,  actos  intolerant 01-2018, sees Dr Faythe + Mild neuropathy on exam 03/2017 (used to be on gaba for muscle cramps) HTN Renal: CKD, proteinuria Hyperlipidemia Hypothyroidism h/oThyroid cancer, nodularity, no mets, thyroidectomy 2013- sees Dr Faythe   CV: --CHF/nonischemic cardiomyopathy dx 2011 --Ventricular tachycardia, 2011,  EF  ~15 -20 % -- s/p  biventricular ICD 2011--- replaced 04-2016 --CAD -Nonobstructive   by cath 08-2014 --LBBB  GI: --GERD --H/o anemia:  --Gastric ulcer per EGD 07-2010 DJD (hip pain, XRs dosne 2017) Gout H/o asthma as a child  Large L DVT:  ---11/11/2023 Mechanical thrombectomy f/u by Angioplasty stenting L common and external iliac veins.   ---06/05/2024: Eliquis  stopped, on aspirin  indefinitely per  vascular surgery  Assessment & Plan DM with CKD, CAD, CHF. Per Endo but was seen at this office October 23, 2024 with blood sugars over 300.insulin  increased from 10 units to 14 units. Since then, sugars are not as high, CGM data reviewed: Only 49% in is time in range, no hypoglycemic episodes. Current insulin  regimen is Lantus 14 units daily. Plan: - Increase Lantus to 16, 18 and 20 units gradually as long as he has no hypoglycemia.  See AVS for detailed explanation. - Discussed the benefits of Jardiance  or similar medications as recommended by nephrology, in the past he felt that medication caused some dehydration.  Nevertheless I encouraged him to discuss with Endo and nephrology. - Continue monitoring glucose levels with CGM. Systolic CHF, ICD:   Seen at this office 10/23/2024, has some increased difficulty breathing, there was some concerns about fluid overload, Lasix  increased to 40 mg for 3 days and then go back to 20 mg daily, chest x-ray was nonacute. Symptoms definitely improved.  No current dyspnea, but some congestion and hoarseness persist.   - Continue current heart failure medications: carvedilol , Lasix , losartan , spironolactone. - Monitor weight daily and report any consistent weight gain of 5 pounds or more. CKD with proteinuria Due to proteinuria was referred to nephrology, LOV w/ renal was   10/09/2024 (next visit 4 months) Proteinuria was confirmed, he also diagnosed with CKD felt to be due to DM and HTN.  Additional tests were done to rule out other etiologies, they come back unremarkable. They suggested SGLT2 inhibitor but the patient is hesitant. Chronic diarrhea Chronic, intermittent diarrhea, see LOV. Last colonoscopy was in 2022.  Plan: Refer to GI.  No urgent.  HTN - Continue current antihypertensive medications: carvedilol , losartan , spironolactone. Hyperlipidemia Managed with atorvastatin . - Continue atorvastatin  80 mg daily. Gout Managed with  allopurinol . Anemia: Last hemoglobin 11.9.  Monitor at the next opportunity DVT, L leg, had IR thrombectomy thrombolysis and stenting December 2024.  Last follow-up with vascular specialist 06/05/2024, they stop Eliquis  and recommended aspirin  indefinitely. General Health Maintenance Received flu and COVID vaccines. - Continue routine health maintenance and vaccinations as recommended. RTC 3 to 4 months for CPX Time spent 35 minutes, extensive chart reviewe including recent visit to this office, renal and cardiology notes.  Extensive discussion about the need to weigh himself and diabetes management.    "

## 2024-11-25 DIAGNOSIS — E119 Type 2 diabetes mellitus without complications: Secondary | ICD-10-CM | POA: Insufficient documentation

## 2024-11-25 NOTE — Assessment & Plan Note (Signed)
 DM with CKD, CAD, CHF. Per Endo but was seen at this office October 23, 2024 with blood sugars over 300.insulin  increased from 10 units to 14 units. Since then, sugars are not as high, CGM data reviewed: Only 49% in is time in range, no hypoglycemic episodes. Current insulin  regimen is Lantus 14 units daily. Plan: - Increase Lantus to 16, 18 and 20 units gradually as long as he has no hypoglycemia.  See AVS for detailed explanation. - Discussed the benefits of Jardiance  or similar medications as recommended by nephrology, in the past he felt that medication caused some dehydration.  Nevertheless I encouraged him to discuss with Endo and nephrology. - Continue monitoring glucose levels with CGM. Systolic CHF, ICD:   Seen at this office 10/23/2024, has some increased difficulty breathing, there was some concerns about fluid overload, Lasix  increased to 40 mg for 3 days and then go back to 20 mg daily, chest x-ray was nonacute. Symptoms definitely improved.  No current dyspnea, but some congestion and hoarseness persist.   - Continue current heart failure medications: carvedilol , Lasix , losartan , spironolactone. - Monitor weight daily and report any consistent weight gain of 5 pounds or more. CKD with proteinuria Due to proteinuria was referred to nephrology, LOV w/ renal was   10/09/2024 (next visit 4 months) Proteinuria was confirmed, he also diagnosed with CKD felt to be due to DM and HTN.  Additional tests were done to rule out other etiologies, they come back unremarkable. They suggested SGLT2 inhibitor but the patient is hesitant. Chronic diarrhea Chronic, intermittent diarrhea, see LOV. Last colonoscopy was in 2022.  Plan: Refer to GI.  No urgent.  HTN - Continue current antihypertensive medications: carvedilol , losartan , spironolactone. Hyperlipidemia Managed with atorvastatin . - Continue atorvastatin  80 mg daily. Gout Managed with allopurinol . Anemia: Last hemoglobin 11.9.  Monitor  at the next opportunity DVT, L leg, had IR thrombectomy thrombolysis and stenting December 2024.  Last follow-up with vascular specialist 06/05/2024, they stop Eliquis  and recommended aspirin  indefinitely. General Health Maintenance Received flu and COVID vaccines. - Continue routine health maintenance and vaccinations as recommended. RTC 3 to 4 months for CPX

## 2024-11-27 ENCOUNTER — Encounter: Payer: Self-pay | Admitting: Internal Medicine

## 2024-11-28 ENCOUNTER — Ambulatory Visit: Attending: Internal Medicine | Admitting: Internal Medicine

## 2024-11-28 ENCOUNTER — Encounter: Payer: Self-pay | Admitting: Internal Medicine

## 2024-11-28 VITALS — BP 128/74 | HR 55 | Ht 74.0 in | Wt 238.6 lb

## 2024-11-28 DIAGNOSIS — I10 Essential (primary) hypertension: Secondary | ICD-10-CM | POA: Diagnosis not present

## 2024-11-28 DIAGNOSIS — I447 Left bundle-branch block, unspecified: Secondary | ICD-10-CM

## 2024-11-28 DIAGNOSIS — I472 Ventricular tachycardia, unspecified: Secondary | ICD-10-CM | POA: Diagnosis not present

## 2024-11-28 LAB — CUP PACEART INCLINIC DEVICE CHECK
Date Time Interrogation Session: 20251231205015
Implantable Lead Connection Status: 753985
Implantable Lead Connection Status: 753985
Implantable Lead Connection Status: 753985
Implantable Lead Implant Date: 20110516
Implantable Lead Implant Date: 20110516
Implantable Lead Implant Date: 20110516
Implantable Lead Location: 753858
Implantable Lead Location: 753859
Implantable Lead Location: 753860
Implantable Lead Model: 5076
Implantable Lead Model: 6947
Implantable Pulse Generator Implant Date: 20251001

## 2024-11-28 NOTE — Progress Notes (Signed)
 "     HPI Mike Jimenez returns today for followup. He is a pleasant 73 yo man with an non-ICM, chronic systolic CHF, and VT (sustained monomorphic). He is s/p BiV ICD implant. In the interim, he has had no chest pain or sob or ICD shock. No syncope. He was diagnosed Thyroid  CA, and underwent surgery. He is on thyroid  replacement. He has not had any ICD therapies in the last 2 years. He has retired. He has had no edema. He notes some increased dyspnea and admits to recent dietary indiscretion. He underwent ICD gen change out about 2 months ago.  Allergies[1]   Current Outpatient Medications  Medication Sig Dispense Refill   acetaminophen  (TYLENOL ) 500 MG tablet Take 1,000 mg by mouth every 6 (six) hours as needed (pain).     allopurinol  (ZYLOPRIM ) 100 MG tablet Take 1 tablet (100 mg total) by mouth daily. 90 tablet 1   aspirin  EC 81 MG tablet Take 1 tablet (81 mg total) by mouth daily.     atorvastatin  (LIPITOR) 80 MG tablet Take 80 mg by mouth at bedtime.     carvedilol  (COREG ) 12.5 MG tablet Take 1 tablet (12.5 mg total) by mouth daily. 90 tablet 1   Continuous Glucose Sensor (FREESTYLE LIBRE 3 PLUS SENSOR) MISC Change sensor every 15 days. 6 each 3   cyanocobalamin (VITAMIN B12) 1000 MCG tablet Take 1,000 mcg by mouth daily.     EMBECTA PEN NEEDLE NANO 2 GEN 32G X 4 MM MISC SMARTSIG:injection Daily     furosemide  (LASIX ) 20 MG tablet Take 1 tablet (20 mg total) by mouth daily. 90 tablet 1   levothyroxine  (SYNTHROID ) 112 MCG tablet Take 1.5 tablets (168 mcg total) by mouth daily before breakfast.     metFORMIN  (GLUCOPHAGE ) 1000 MG tablet Take 1 tablet (1,000 mg total) by mouth 2 (two) times daily with a meal. 180 tablet 1   Multiple Vitamin (MULTIVITAMIN WITH MINERALS) TABS tablet Take 1 tablet by mouth daily.     ONETOUCH VERIO test strip daily.     OVER THE COUNTER MEDICATION Take 5 tablets by mouth at bedtime as needed (sleep). CBD gummy     spironolactone (ALDACTONE) 25 MG tablet Take 25  mg by mouth daily.     tadalafil  (CIALIS ) 20 MG tablet Take 1 tablet (20 mg total) by mouth daily as needed for erectile dysfunction. 10 tablet 5   vitamin E 400 UNIT capsule Take 400 Units by mouth daily.     albuterol  (VENTOLIN  HFA) 108 (90 Base) MCG/ACT inhaler Inhale 2 puffs into the lungs every 6 (six) hours as needed for wheezing or shortness of breath. (Patient not taking: Reported on 11/23/2024) 8 g 0   LANTUS SOLOSTAR 100 UNIT/ML Solostar Pen Inject 14 Units into the skin daily. Titrate up when needed, max daily dose of 50 units     losartan  (COZAAR ) 100 MG tablet Take 1 tablet (100 mg total) by mouth daily.     No current facility-administered medications for this visit.     Past Medical History:  Diagnosis Date   Anemia    Arthritis    knees (09/05/2014)   Automatic implantable cardioverter-defibrillator in situ    CAD (coronary artery disease)    a. Mild nonobstructive CAD by cath 08/2014.   CHF (congestive heart failure) (HCC)    Childhood asthma    Gastric ulcer 2011   GERD (gastroesophageal reflux disease)    History of gout    Hyperlipidemia  Hypertension    LBBB (left bundle branch block)    NICM (nonischemic cardiomyopathy) (HCC)    a. Dx 2011 - presented with sustained VT. Normal coronaries at that time, EF 10-15% by cath and 20-25% by echo. b. s/p biventricular ICD 2011. c. CP 08/2014: mild nonobstructive CAD.   Obesity    Postsurgical hypothyroidism    Thyroid  cancer (HCC)    a. 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 Dr Eletha. s/p thyroidectomy 2013.   Torn meniscus    Type II diabetes mellitus (HCC)    Ventricular tachycardia (HCC)    a. Sustained VT 2011 s/p MDT D274TRK Concert BiV ICD, ser# [id number] H.    ROS:   All systems reviewed and negative except as noted in the HPI.   Past Surgical History:  Procedure Laterality Date   BI-VENTRICULAR IMPLANTABLE CARDIOVERTER DEFIBRILLATOR  (CRT-D)  03/2010   BIOPSY  08/06/2021   Procedure:  BIOPSY;  Surgeon: Abran Norleen SAILOR, MD;  Location: [facility];  Service: Endoscopy;;   BIV ICD GENERATOR CHANGEOUT N/A 08/29/2024   Procedure: BIV ICD GENERATOR CHANGEOUT;  Surgeon: [name] Danelle ORN, MD;  Location[facility] INVASIVE CV LAB;  Service: Cardiovascular;  Laterality: N/A;   CARDIAC CATHETERIZATION  03/2010   COLONOSCOPY WITH PROPOFOL  N/A 08/06/2021   Procedure: COLONOSCOPY WITH PROPOFOL ;  Surgeon: Abran Norleen SAILOR, MD;  Location: [facility];  Service: Endoscopy;  Laterality: N/A;   EP IMPLANTABLE DEVICE N/A 05/21/2016   Procedure:  ICD Generator Changeout;  Surgeon: Danelle ORN [name], MD;  Location: [facility] INVASIVE CV LAB;  Service: Cardiovascular;  Laterality: N/A;   INSERTION OF ILIAC STENT Left 11/12/2023   Procedure: INSERTION OF LEFT ILIAC VEIN STENT;  Surgeon: Magda Debby SAILOR, MD;  Location: [facility] OR;  Service: Vascular;  Laterality: Left;   LEFT HEART CATHETERIZATION WITH CORONARY ANGIOGRAM N/A 09/06/2014   Procedure: LEFT HEART CATHETERIZATION WITH CORONARY ANGIOGRAM;  Surgeon: Lonni JONETTA Cash, MD;  Location: [facility] CATH LAB;  Service: Cardiovascular;  Laterality: N/A;   LOWER EXTREMITY VENOGRAPHY Left 11/11/2023   Procedure: LOWER EXTREMITY VENOGRAPHY;  Surgeon: Magda Debby SAILOR, MD;  Location: [facility] INVASIVE CV LAB;  Service: Cardiovascular;  Laterality: Left;   PERIPHERAL VASCULAR THROMBECTOMY Left 11/11/2023   Procedure: PERIPHERAL VASCULAR THROMBECTOMY;  Surgeon: Magda Debby SAILOR, MD;  Location: [facility] INVASIVE CV LAB;  Service: Cardiovascular;  Laterality: Left;   PERIPHERAL VASCULAR ULTRASOUND/IVUS Left 11/11/2023   Procedure: Peripheral Vascular Ultrasound/IVUS;  Surgeon: Magda Debby SAILOR, MD;  Location: [facility] INVASIVE CV LAB;  Service: Cardiovascular;  Laterality: Left;   POLYPECTOMY  08/06/2021   Procedure: POLYPECTOMY;  Surgeon: Abran Norleen SAILOR, MD;  Location: [facility];  Service: Endoscopy;;   PTX     traumatic, age 1 months   THYROIDECTOMY  12/10/2011   Procedure: TOTAL THYROIDECTOMY WITH  CENTRAL COMPARTMENT DISSECTION;  Surgeon: Krystal CHRISTELLA Eletha, MD;  Location: [facility] OR;  Service: General;  Laterality: N/A;  Total thyroidectomy with limited central compartment lymph node dissection.   VASECTOMY       Family History  Problem Relation Age of Onset   Atrial fibrillation Father        diseased at ag 67   Hypertension Father        alive @ 62.   Hypothyroidism Mother    Hypertension Mother        alive @ 23.   Diabetes Maternal Grandfather    Diabetes Maternal Aunt    Colon cancer Maternal Aunt    Colon cancer Maternal Aunt  Lung cancer Paternal Uncle    Lung cancer Maternal Uncle    Asthma Sister    Hypothyroidism Daughter    Prostate cancer Neg Hx    CAD Neg Hx      Social History   Socioeconomic History   Marital status: Married    Spouse name: Not on file   Number of children: 2   Years of education: Not on file   Highest education level: Bachelor's degree (e.g., BA, AB, BS)  Occupational History   Occupation: [employer]   Occupation: to retire 2023  Tobacco Use   Smoking status: Former    Current packs/day: 0.00    Average packs/day: 2.5 packs/day for 2.0 years (5.0 ttl pk-yrs)    Types: Cigarettes    Start date: 11/29/1968    Quit date: 11/29/1970    Years since quitting: 54.0   Smokeless tobacco: Former    Types: Associate Professor status: Never Used  Substance and Sexual Activity   Alcohol use: Yes    Comment: drinks rarely   Drug use: No   Sexual activity: Yes  Other Topics Concern   Not on file  Social History Narrative   Live w/ wife , daughter [name] lives in [place]   11 dogs, fish pond.       Social Drivers of Health   Tobacco Use: Medium Risk (11/28/2024)   Patient History    Smoking Tobacco Use: Former    Smokeless Tobacco Use: Former    Passive Exposure: Not on Actuary Strain: Low Risk (10/22/2024)   Overall Financial Resource Strain (CARDIA)    Difficulty of Paying Living Expenses: Not hard at all  Food  Insecurity: No Food Insecurity (10/22/2024)   Epic    Worried About Programme Researcher, Broadcasting/film/video in the Last Year: Never true    Ran Out of Food in the Last Year: Never true  Transportation Needs: No Transportation Needs (10/22/2024)   Epic    Lack of Transportation (Medical): No    Lack of Transportation (Non-Medical): No  Physical Activity: Insufficiently Active (10/22/2024)   Exercise Vital Sign    Days of Exercise per Week: 2 days    Minutes of Exercise per Session: 60 min  Stress: No Stress Concern Present (10/22/2024)   Mike Jimenez    Feeling of Stress: Not at all  Social Connections: Moderately Integrated (10/22/2024)   Social Connection and Isolation Panel    Frequency of Communication with Friends and Family: Three times a week    Frequency of Social Gatherings with Friends and Family: Once a week    Attends Religious Services: 1 to 4 times per year    Active Member of [employer] or Organizations: No    Attends Engineer, Structural: Not on file    Marital Status: Married  Catering Manager Violence: Not At Risk (03/06/2024)   Humiliation, Afraid, Rape, and Kick Jimenez    Fear of Current or Ex-Partner: No    Emotionally Abused: No    Physically Abused: No    Sexually Abused: No  Depression (PHQ2-9): Low Risk (11/23/2024)   Depression (PHQ2-9)    PHQ-2 Score: 0  Alcohol Screen: Low Risk (10/22/2024)   Alcohol Screen    Last Alcohol Screening Score (AUDIT): 1  Housing: Low Risk (10/22/2024)   Epic    Unable to Pay for Housing in the Last Year: No    Number of Times Moved  in the Last Year: 0    Homeless in the Last Year: No  Utilities: Not At Risk (03/06/2024)   [facility] Utilities    Threatened with loss of utilities: No  Health Literacy: Adequate Health Literacy (03/06/2024)   B1300 Health Literacy    Frequency of need for help with medical instructions: Never     BP 128/74 (BP Location: Left Arm, Patient  Position: Sitting)   Pulse (!) 55   Ht 6' 2 (1.88 m)   Wt 238 lb 9.6 oz (108.2 kg)   SpO2 94%   BMI 30.63 kg/m   Physical Exam:  Well appearing NAD HEENT: Unremarkable Neck:  No JVD, no thyromegally Lymphatics:  No adenopathy Back:  No CVA tenderness Lungs:  Clear HEART:  Regular rate rhythm, no murmurs, no rubs, no clicks Abd:  soft, positive bowel sounds, no organomegally, no rebound, no guarding Ext:  2 plus pulses, no edema, no cyanosis, no clubbing Skin:  No rashes no nodules Neuro:  CN II through XII intact, motor grossly intact  EKG - NSR with AV pacing  DEVICE  Normal device function.  See PaceArt for details.   Assess/Plan:  VT - he has not had any ICD therapies.  2. Chronic systolic heart failure- he remains class 2 but his fluid index is up. I asked him to take an extra lasix  for the next 3 days and avoid salty foods. 3. ICD - his medtronic Biv ICD is working normally.    Tannah Dreyfuss,M.D.    [1]  Allergies Allergen Reactions   Bee Venom Swelling   Amiodarone  Other (See Comments)   Ramipril Cough   "

## 2024-11-28 NOTE — Patient Instructions (Signed)
 Medication Instructions:  Your physician recommends that you continue on your current medications as directed. Please refer to the Current Medication list given to you today.  *If you need a refill on your cardiac medications before your next appointment, please call your pharmacy*  Lab Work: None ordered.  You may go to any [facility] Location for your lab work:  [facility] - [address] ([facility]) - [address] [phone] [address]  [place] - [address]   [place]  - [address]   [place] - [address] - [address] [place]  - [address] [place] - [address] ([facility]   If you have labs (blood work) drawn today and your tests are completely normal, you will receive your results only by: [employer] (if you have MyChart)  If you have any lab test that is abnormal or we need to change your treatment, we will call you or send a MyChart message to review the results.  Testing/Procedures: None ordered.  Follow-Up: At [facility], you and your health needs are our priority.  As part of our continuing mission to provide you with exceptional heart care, we have created designated Provider Care Teams.  These Care Teams include your primary Cardiologist (physician) and Advanced Practice Providers (APPs -  Physician Assistants and Nurse Practitioners) who all work together to provide you with the care you need, when you need it.  We recommend signing up for the patient portal called MyChart.  Sign up information is provided on this After Visit Summary.  MyChart is used to connect with patients for Virtual Visits (Telemedicine).  Patients are able to view lab/test results, encounter notes, upcoming appointments, etc.  Non-urgent messages can be sent to your provider as well.   To learn more about what you can do with MyChart, go to  [email or web address].    Your next appointment:   1 year(s)

## 2024-12-06 ENCOUNTER — Telehealth: Payer: Self-pay | Admitting: Internal Medicine

## 2024-12-06 MED ORDER — ATORVASTATIN CALCIUM 80 MG PO TABS: 80.0000 mg | ORAL_TABLET | Freq: Every day | ORAL | 1 refills | Status: AC

## 2024-12-06 NOTE — Telephone Encounter (Signed)
 Rx sent

## 2024-12-06 NOTE — Telephone Encounter (Signed)
 Copied from CRM #[id number]. Topic: Clinical - Medication Refill >> Dec 06, 2024 10:46 AM Jayma L wrote: Medication: atorvastatin  (LIPITOR) 80 MG tablet  Has the patient contacted their pharmacy? Yes (Agent: If no, request that the patient contact the pharmacy for the refill. If patient does not wish to contact the pharmacy document the reason why and proceed with request.) (Agent: If yes, when and what did the pharmacy advise?)  This is the patient's preferred pharmacy:  [id number] [place],  - [address] [place] [address] Phone: [phone] Fax: [phone]  Is this the correct pharmacy for this prescription? Yes If no, delete pharmacy and type the correct one.   Has the prescription been filled recently? No  Is the patient out of the medication? Yes  Has the patient been seen for an appointment in the last year OR does the patient have an upcoming appointment? Yes  Can we respond through MyChart? Yes  Agent: Please be advised that Rx refills may take up to 3 business days. We ask that you follow-up with your pharmacy.

## 2024-12-06 NOTE — Telephone Encounter (Signed)
 Unable to pend requested medication.  Medication: atorvastatin  (LIPITOR) 80 MG tablet

## 2024-12-17 ENCOUNTER — Ambulatory Visit: Attending: Cardiology

## 2024-12-17 DIAGNOSIS — I5022 Chronic systolic (congestive) heart failure: Secondary | ICD-10-CM

## 2024-12-17 DIAGNOSIS — Z95810 Presence of automatic (implantable) cardiac defibrillator: Secondary | ICD-10-CM

## 2024-12-20 NOTE — Progress Notes (Signed)
 EPIC Encounter for ICM Monitoring  Patient Name: Mike Jimenez is a 74 y.o. male Date: 12/20/2024 Primary Care Physican: Mike Aloysius BRAVO, MD Primary Cardiologist: Mike Jimenez Electrophysiologist: Mike Jimenez Bi-V Pacing:  99.3% 01/25/2024 Weight: 230-235 lbs 03/06/2024 Office Weight: 234 lbs 08/09/2024 Office Weight: 238 lbs 10/05/2024 Weight: 234-238 lbs 12/20/2024 Weight: 233 lbs   Clinical Status Clinical Status (28-Nov-2024 to 17-Dec-2024) Time in AT/AF  0.0 hr/day (0.0%)                                          Spoke with patient and heart failure questions reviewed.  Transmission results reviewed.  Pt asymptomatic for fluid accumulation.  Has an appt with GI physician 12/25/2024.   Since 11/12/2024 ICM Remote Transmission:  OptiVol Thoracic impedance suggesting normal fluid levels.   Prescribed:  Furosemide  20 mg 1 tablet (20 mg total) daily.   Potassium 10 mEq 1 tablet (10 mEq total) daily   Labs: 10/23/2024 Creatinine 1.33, BUN 24, Potassium 4.4, Sodium 136, GFR 53.32 08/20/2024 Creatinine 1.19, BUN 20, Potassium 4.7, Sodium 143, GFR 65 06/14/2024 Creatinine 1.1,   BUN 16, Potassium 4.1, Sodium 139, GFR 73 05/30/2024 Creatinine 0.9,   BUN 14, Potassium 4.4, Sodium 141, GFR 88 05/23/2024 Creatinine 1.11, BUN 19, Potassium 3.9, Sodium 143, GFR 66.43 05/07/2024 Creatinine 1.02, BUN 15, Potassium 4.0, Sodium 141, GFR 73.55 01/06/2024 Creatinine 1.06, BUN 15, Potassium 4.4, Sodium 141, GFR 70.40 A complete set of results can be found in Results Review.   Recommendations: No changes and encouraged to call if experiencing any fluid symptoms.   Follow-up plan: ICM clinic phone appointment on 01/21/2025.   91 day device clinic remote transmission 01/09/2025.     EP/Cardiology Office Visits:  Next EP appt due 11/28/2025.    Copy of ICM check sent to Dr. Inocencio.    Remote monitoring is medically necessary for Heart Failure Management.    Daily Thoracic Impedance ICM trend: 09/17/2024 through  12/17/2024.    12-14 Month Thoracic Impedance ICM trend:     Mike GORMAN Garner, RN 12/20/2024 10:33 AM

## 2024-12-24 ENCOUNTER — Ambulatory Visit

## 2024-12-25 ENCOUNTER — Ambulatory Visit: Admitting: Gastroenterology

## 2024-12-25 NOTE — Progress Notes (Unsigned)
 "  Chief Complaint: Primary GI MD: Dr. Abran  HPI:  *** is a  ***  who was referred to me by Amon Aloysius BRAVO, MD for a complaint of *** .     Discussed the use of AI scribe software for clinical note transcription with the patient, who gave verbal consent to proceed.  History of Present Illness      PREVIOUS GI WORKUP     Past Medical History:  Diagnosis Date   Anemia    Arthritis    knees (09/05/2014)   Automatic implantable cardioverter-defibrillator in situ    CAD (coronary artery disease)    a. Mild nonobstructive CAD by cath 08/2014.   CHF (congestive heart failure) (HCC)    Childhood asthma    Gastric ulcer 2011   GERD (gastroesophageal reflux disease)    History of gout    Hyperlipidemia    Hypertension    LBBB (left bundle branch block)    NICM (nonischemic cardiomyopathy) (HCC)    a. Dx 2011 - presented with sustained VT. Normal coronaries at that time, EF 10-15% by cath and 20-25% by echo. b. s/p biventricular ICD 2011. c. CP 08/2014: mild nonobstructive CAD.   Obesity    Postsurgical hypothyroidism    Thyroid  cancer (HCC)    a. 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 Dr Eletha. s/p thyroidectomy 2013.   Torn meniscus    Type II diabetes mellitus (HCC)    Ventricular tachycardia (HCC)    a. Sustained VT 2011 s/p MDT [id number] Concert BiV ICD, ser# [id number].    Past Surgical History:  Procedure Laterality Date   BI-VENTRICULAR IMPLANTABLE CARDIOVERTER DEFIBRILLATOR  (CRT-D)  03/2010   BIOPSY  08/06/2021   Procedure: BIOPSY;  Surgeon: Abran Norleen SAILOR, MD;  Location: [facility];  Service: Endoscopy;;   BIV ICD GENERATOR CHANGEOUT N/A 08/29/2024   Procedure: BIV ICD GENERATOR CHANGEOUT;  Surgeon: [name] Danelle ORN, MD;  Location: [facility] INVASIVE CV LAB;  Service: Cardiovascular;  Laterality: N/A;   CARDIAC CATHETERIZATION  03/2010   COLONOSCOPY WITH PROPOFOL  N/A 08/06/2021   Procedure: COLONOSCOPY WITH PROPOFOL ;  Surgeon: Abran Norleen SAILOR, MD;  Location: [facility];  Service: Endoscopy;  Laterality: N/A;   EP IMPLANTABLE DEVICE N/A 05/21/2016   Procedure:  ICD Generator Changeout;  Surgeon: Danelle ORN [name], MD;  Location: [facility] INVASIVE CV LAB;  Service: Cardiovascular;  Laterality: N/A;   INSERTION OF ILIAC STENT Left 11/12/2023   Procedure: INSERTION OF LEFT ILIAC VEIN STENT;  Surgeon: Magda Debby SAILOR, MD;  Location: [facility] OR;  Service: Vascular;  Laterality: Left;   LEFT HEART CATHETERIZATION WITH CORONARY ANGIOGRAM N/A 09/06/2014   Procedure: LEFT HEART CATHETERIZATION WITH CORONARY ANGIOGRAM;  Surgeon: Lonni JONETTA Cash, MD;  Location: [facility] CATH LAB;  Service: Cardiovascular;  Laterality: N/A;   LOWER EXTREMITY VENOGRAPHY Left 11/11/2023   Procedure: LOWER EXTREMITY VENOGRAPHY;  Surgeon: Magda Debby SAILOR, MD;  Location: [facility] INVASIVE CV LAB;  Service: Cardiovascular;  Laterality: Left;   PERIPHERAL VASCULAR THROMBECTOMY Left 11/11/2023   Procedure: PERIPHERAL VASCULAR THROMBECTOMY;  Surgeon: Magda Debby SAILOR, MD;  Location: [facility] INVASIVE CV LAB;  Service: Cardiovascular;  Laterality: Left;   PERIPHERAL VASCULAR ULTRASOUND/IVUS Left 11/11/2023   Procedure: Peripheral Vascular Ultrasound/IVUS;  Surgeon: Magda Debby SAILOR, MD;  Location: [facility] INVASIVE CV LAB;  Service: Cardiovascular;  Laterality: Left;   POLYPECTOMY  08/06/2021   Procedure: POLYPECTOMY;  Surgeon: Abran Norleen SAILOR, MD;  Location: [facility];  Service: Endoscopy;;   PTX  traumatic, age 102 months   THYROIDECTOMY  12/10/2011   Procedure: TOTAL THYROIDECTOMY WITH CENTRAL COMPARTMENT DISSECTION;  Surgeon: Krystal CHRISTELLA Spinner, MD;  Location: [facility] OR;  Service: General;  Laterality: N/A;  Total thyroidectomy with limited central compartment lymph node dissection.   VASECTOMY      Current Outpatient Medications  Medication Sig Dispense Refill   acetaminophen  (TYLENOL ) 500 MG tablet Take 1,000 mg by mouth every 6 (six) hours as needed (pain).     albuterol  (VENTOLIN  HFA) 108 (90 Base) MCG/ACT inhaler  Inhale 2 puffs into the lungs every 6 (six) hours as needed for wheezing or shortness of breath. (Patient not taking: Reported on 11/23/2024) 8 g 0   allopurinol  (ZYLOPRIM ) 100 MG tablet Take 1 tablet (100 mg total) by mouth daily. 90 tablet 1   aspirin  EC 81 MG tablet Take 1 tablet (81 mg total) by mouth daily.     atorvastatin  (LIPITOR) 80 MG tablet Take 1 tablet (80 mg total) by mouth at bedtime. 90 tablet 1   carvedilol  (COREG ) 12.5 MG tablet Take 1 tablet (12.5 mg total) by mouth daily. 90 tablet 1   Continuous Glucose Sensor (FREESTYLE LIBRE 3 PLUS SENSOR) MISC Change sensor every 15 days. 6 each 3   cyanocobalamin (VITAMIN B12) 1000 MCG tablet Take 1,000 mcg by mouth daily.     EMBECTA PEN NEEDLE NANO 2 GEN 32G X 4 MM MISC SMARTSIG:injection Daily     furosemide  (LASIX ) 20 MG tablet Take 1 tablet (20 mg total) by mouth daily. 90 tablet 1   LANTUS SOLOSTAR 100 UNIT/ML Solostar Pen Inject 14 Units into the skin daily. Titrate up when needed, max daily dose of 50 units     levothyroxine  (SYNTHROID ) 112 MCG tablet Take 1.5 tablets (168 mcg total) by mouth daily before breakfast.     losartan  (COZAAR ) 100 MG tablet Take 1 tablet (100 mg total) by mouth daily.     metFORMIN  (GLUCOPHAGE ) 1000 MG tablet Take 1 tablet (1,000 mg total) by mouth 2 (two) times daily with a meal. 180 tablet 1   Multiple Vitamin (MULTIVITAMIN WITH MINERALS) TABS tablet Take 1 tablet by mouth daily.     ONETOUCH VERIO test strip daily.     OVER THE COUNTER MEDICATION Take 5 tablets by mouth at bedtime as needed (sleep). CBD gummy     spironolactone (ALDACTONE) 25 MG tablet Take 25 mg by mouth daily.     tadalafil  (CIALIS ) 20 MG tablet Take 1 tablet (20 mg total) by mouth daily as needed for erectile dysfunction. 10 tablet 5   vitamin E 400 UNIT capsule Take 400 Units by mouth daily.     No current facility-administered medications for this visit.    Allergies as of 12/25/2024 - Review Complete 11/28/2024  Allergen  Reaction Noted   Bee venom Swelling 05/08/2021   Amiodarone  Other (See Comments) 03/07/2016   Ramipril Cough     Family History  Problem Relation Age of Onset   Atrial fibrillation Father        diseased at ag 70   Hypertension Father        alive @ 84.   Hypothyroidism Mother    Hypertension Mother        alive @ 5.   Diabetes Maternal Grandfather    Diabetes Maternal Aunt    Colon cancer Maternal Aunt    Colon cancer Maternal Aunt    Lung cancer Paternal Uncle    Lung cancer Maternal Uncle  Asthma Sister    Hypothyroidism Daughter    Prostate cancer Neg Hx    CAD Neg Hx     Social History   Socioeconomic History   Marital status: Married    Spouse name: Not on file   Number of children: 2   Years of education: Not on file   Highest education level: Bachelor's degree (e.g., BA, AB, BS)  Occupational History   Occupation: [employer]   Occupation: to retire 2023  Tobacco Use   Smoking status: Former    Current packs/day: 0.00    Average packs/day: 2.5 packs/day for 2.0 years (5.0 ttl pk-yrs)    Types: Cigarettes    Start date: 11/29/1968    Quit date: 11/29/1970    Years since quitting: 54.1   Smokeless tobacco: Former    Types: Associate Professor status: Never Used  Substance and Sexual Activity   Alcohol use: Yes    Comment: drinks rarely   Drug use: No   Sexual activity: Yes  Other Topics Concern   Not on file  Social History Narrative   Live w/ wife , daughter [name] lives in [place]   11 dogs, fish pond.       Social Drivers of Health   Tobacco Use: Medium Risk (11/28/2024)   Patient History    Smoking Tobacco Use: Former    Smokeless Tobacco Use: Former    Passive Exposure: Not on Actuary Strain: Low Risk (10/22/2024)   Overall Financial Resource Strain (CARDIA)    Difficulty of Paying Living Expenses: Not hard at all  Food Insecurity: No Food Insecurity (10/22/2024)   Epic    Worried About Programme Researcher, Broadcasting/film/video in the Last  Year: Never true    Ran Out of Food in the Last Year: Never true  Transportation Needs: No Transportation Needs (10/22/2024)   Epic    Lack of Transportation (Medical): No    Lack of Transportation (Non-Medical): No  Physical Activity: Insufficiently Active (10/22/2024)   Exercise Vital Sign    Days of Exercise per Week: 2 days    Minutes of Exercise per Session: 60 min  Stress: No Stress Concern Present (10/22/2024)   [employer] of Occupational Health - Occupational Stress Questionnaire    Feeling of Stress: Not at all  Social Connections: Moderately Integrated (10/22/2024)   Social Connection and Isolation Panel    Frequency of Communication with Friends and Family: Three times a week    Frequency of Social Gatherings with Friends and Family: Once a week    Attends Religious Services: 1 to 4 times per year    Active Member of [employer] or Organizations: No    Attends Engineer, Structural: Not on file    Marital Status: Married  Catering Manager Violence: Not At Risk (03/06/2024)   Humiliation, Afraid, Rape, and Kick questionnaire    Fear of Current or Ex-Partner: No    Emotionally Abused: No    Physically Abused: No    Sexually Abused: No  Depression (PHQ2-9): Low Risk (11/23/2024)   Depression (PHQ2-9)    PHQ-2 Score: 0  Alcohol Screen: Low Risk (10/22/2024)   Alcohol Screen    Last Alcohol Screening Score (AUDIT): 1  Housing: Low Risk (10/22/2024)   Epic    Unable to Pay for Housing in the Last Year: No    Number of Times Moved in the Last Year: 0    Homeless in the Last Year: No  Utilities: Not At Risk (03/06/2024)   AHC Utilities    Threatened with loss of utilities: No  Health Literacy: Adequate Health Literacy (03/06/2024)   B1300 Health Literacy    Frequency of need for help with medical instructions: Never    Review of Systems:    Constitutional: No weight loss, fever, chills, weakness or fatigue HEENT: Eyes: No change in vision               Ears,  Nose, Throat:  No change in hearing or congestion Skin: No rash or itching Cardiovascular: No chest pain, chest pressure or palpitations   Respiratory: No SOB or cough Gastrointestinal: See HPI and otherwise negative Genitourinary: No dysuria or change in urinary frequency Neurological: No headache, dizziness or syncope Musculoskeletal: No new muscle or joint pain Hematologic: No bleeding or bruising Psychiatric: No history of depression or anxiety    Physical Exam:  Vital signs: There were no vitals taken for this visit.  Constitutional: NAD, alert and cooperative Head:  Normocephalic and atraumatic. Eyes:   PEERL, EOMI. No icterus. Conjunctiva pink. Respiratory: Respirations even and unlabored. Lungs clear to auscultation bilaterally.   No wheezes, crackles, or rhonchi.  Cardiovascular:  Regular rate and rhythm. No peripheral edema, cyanosis or pallor.  Gastrointestinal:  Soft, nondistended, nontender. No rebound or guarding. Normal bowel sounds. No appreciable masses or hepatomegaly. Rectal:  Declines Msk:  Symmetrical without gross deformities. Without edema, no deformity or joint abnormality.  Neurologic:  Alert and  oriented x4;  grossly normal neurologically.  Skin:   Dry and intact without significant lesions or rashes. Psychiatric: Oriented to person, place and time. Demonstrates good judgement and reason without abnormal affect or behaviors.  Physical Exam    RELEVANT LABS AND IMAGING: CBC    Component Value Date/Time   WBC 10.3 10/23/2024 1002   RBC 3.74 (L) 10/23/2024 1002   HGB 11.9 (L) 10/23/2024 1002   HGB 13.1 08/20/2024 1025   HCT 33.8 (L) 10/23/2024 1002   HCT 39.6 08/20/2024 1025   PLT 295.0 10/23/2024 1002   PLT 286 08/20/2024 1025   MCV 90.4 10/23/2024 1002   MCV 93 08/20/2024 1025   MCH 30.8 08/20/2024 1025   MCH 30.3 11/13/2023 0339   MCHC 35.1 10/23/2024 1002   RDW 12.3 10/23/2024 1002   RDW 12.4 08/20/2024 1025   LYMPHSABS 0.6 (L) 10/23/2024  1002   MONOABS 0.8 10/23/2024 1002   EOSABS 0.1 10/23/2024 1002   BASOSABS 0.1 10/23/2024 1002    CMP     Component Value Date/Time   NA 136 10/23/2024 1002   NA 143 08/20/2024 1025   K 4.4 10/23/2024 1002   CL 100 10/23/2024 1002   CO2 26 10/23/2024 1002   GLUCOSE 293 (H) 10/23/2024 1002   BUN 24 (H) 10/23/2024 1002   BUN 20 08/20/2024 1025   CREATININE 1.33 10/23/2024 1002   CREATININE 1.29 (H) 05/14/2016 0944   CALCIUM  8.3 (L) 10/23/2024 1002   PROT 6.8 10/23/2024 1002   PROT 7.3 07/01/2021 1008   ALBUMIN 4.0 10/23/2024 1002   ALBUMIN 4.5 07/01/2021 1008   AST 11 10/23/2024 1002   ALT 11 10/23/2024 1002   ALKPHOS 62 10/23/2024 1002   BILITOT 0.8 10/23/2024 1002   BILITOT 0.3 07/01/2021 1008   GFRNONAA 60 (L) 11/13/2023 0339   GFRNONAA 81 03/07/2016 1346   GFRAA 77 10/16/2020 0000   GFRAA >89 03/07/2016 1346     Assessment/Plan:   History of colon polyps Colonoscopy 2022  with 3 tubular adenomas and recall of 7 years - Repeat 2029 if health allows  Chronic diarrhea Colonoscopy 2022 with biopsies negative for microscopic colitis.  On Trulicity and metformin   Gastric ulcer Per EGD 9/11  DM with CKD On insulin  and metformin   CAD Nonischemic cardiomyopathy CHF s/p ICD Echo 06/2021 with EF 50 to 55%  DVT INR thrombectomy and thrombolysis and stenting December 2024 no longer on Eliquis  continues on aspirin   Thyroid  cancer s/p thyroidectomy 2013 with Dr. Eletha Nestor Blower, PA-C [facility] 12/25/2024, 12:51 PM  Cc: Amon Aloysius BRAVO, MD "

## 2024-12-27 NOTE — Progress Notes (Signed)
 31 day ICM Remote transmission canceled due to [facility] clinic is on hold until further notice.  91 day remote monitoring will continue per protocol.

## 2025-01-09 ENCOUNTER — Ambulatory Visit

## 2025-01-21 ENCOUNTER — Ambulatory Visit

## 2025-03-04 ENCOUNTER — Encounter: Admitting: Internal Medicine

## 2025-03-07 ENCOUNTER — Ambulatory Visit

## 2025-03-25 ENCOUNTER — Ambulatory Visit

## 2025-04-10 ENCOUNTER — Ambulatory Visit

## 2025-06-24 ENCOUNTER — Ambulatory Visit

## 2025-07-10 ENCOUNTER — Ambulatory Visit

## 2025-09-23 ENCOUNTER — Ambulatory Visit

## 2025-10-09 ENCOUNTER — Ambulatory Visit

## 2025-12-23 ENCOUNTER — Ambulatory Visit

## 2026-01-08 ENCOUNTER — Ambulatory Visit
